# Patient Record
Sex: Female | Born: 1965 | Race: White | Hispanic: No | Smoking: Never smoker
Health system: Southern US, Community
[De-identification: ages and names within clinical notes are randomized; demographics above are authoritative.]

## PROBLEM LIST (undated history)

## (undated) DIAGNOSIS — R87619 Unspecified abnormal cytological findings in specimens from cervix uteri: Secondary | ICD-10-CM

## (undated) DIAGNOSIS — M549 Dorsalgia, unspecified: Secondary | ICD-10-CM

## (undated) DIAGNOSIS — F329 Major depressive disorder, single episode, unspecified: Secondary | ICD-10-CM

## (undated) DIAGNOSIS — F32A Depression, unspecified: Secondary | ICD-10-CM

## (undated) DIAGNOSIS — IMO0002 Reserved for concepts with insufficient information to code with codable children: Secondary | ICD-10-CM

## (undated) DIAGNOSIS — T8859XA Other complications of anesthesia, initial encounter: Secondary | ICD-10-CM

## (undated) DIAGNOSIS — M5136 Other intervertebral disc degeneration, lumbar region: Secondary | ICD-10-CM

## (undated) DIAGNOSIS — N840 Polyp of corpus uteri: Secondary | ICD-10-CM

## (undated) DIAGNOSIS — J309 Allergic rhinitis, unspecified: Secondary | ICD-10-CM

## (undated) DIAGNOSIS — Z8489 Family history of other specified conditions: Secondary | ICD-10-CM

## (undated) DIAGNOSIS — R519 Headache, unspecified: Secondary | ICD-10-CM

## (undated) DIAGNOSIS — M4802 Spinal stenosis, cervical region: Secondary | ICD-10-CM

## (undated) DIAGNOSIS — N950 Postmenopausal bleeding: Secondary | ICD-10-CM

## (undated) DIAGNOSIS — G4700 Insomnia, unspecified: Secondary | ICD-10-CM

## (undated) DIAGNOSIS — M4306 Spondylolysis, lumbar region: Secondary | ICD-10-CM

## (undated) DIAGNOSIS — R51 Headache: Secondary | ICD-10-CM

## (undated) DIAGNOSIS — K219 Gastro-esophageal reflux disease without esophagitis: Secondary | ICD-10-CM

## (undated) DIAGNOSIS — F419 Anxiety disorder, unspecified: Secondary | ICD-10-CM

## (undated) DIAGNOSIS — E559 Vitamin D deficiency, unspecified: Secondary | ICD-10-CM

## (undated) DIAGNOSIS — T4145XA Adverse effect of unspecified anesthetic, initial encounter: Secondary | ICD-10-CM

## (undated) DIAGNOSIS — M5030 Other cervical disc degeneration, unspecified cervical region: Secondary | ICD-10-CM

## (undated) HISTORY — PX: PILONIDAL CYST EXCISION: SHX744

## (undated) HISTORY — PX: WISDOM TOOTH EXTRACTION: SHX21

## (undated) HISTORY — DX: Reserved for concepts with insufficient information to code with codable children: IMO0002

## (undated) HISTORY — DX: Unspecified abnormal cytological findings in specimens from cervix uteri: R87.619

## (undated) HISTORY — PX: ANKLE SURGERY: SHX546

## (undated) HISTORY — DX: Major depressive disorder, single episode, unspecified: F32.9

## (undated) HISTORY — DX: Depression, unspecified: F32.A

## (undated) HISTORY — DX: Dorsalgia, unspecified: M54.9

## (undated) HISTORY — DX: Vitamin D deficiency, unspecified: E55.9

## (undated) HISTORY — DX: Allergic rhinitis, unspecified: J30.9

## (undated) HISTORY — DX: Insomnia, unspecified: G47.00

## (undated) HISTORY — DX: Anxiety disorder, unspecified: F41.9

---

## 1986-09-14 HISTORY — PX: PILONIDAL CYST EXCISION: SHX744

## 1999-09-04 ENCOUNTER — Other Ambulatory Visit: Admission: RE | Admit: 1999-09-04 | Discharge: 1999-09-04 | Payer: Self-pay | Admitting: Family Medicine

## 1999-09-15 HISTORY — PX: CERVICAL CONIZATION W/BX: SHX1330

## 2003-05-07 ENCOUNTER — Other Ambulatory Visit: Admission: RE | Admit: 2003-05-07 | Discharge: 2003-05-07 | Payer: Self-pay | Admitting: Obstetrics and Gynecology

## 2003-07-12 ENCOUNTER — Encounter: Admission: RE | Admit: 2003-07-12 | Discharge: 2003-07-12 | Payer: Self-pay | Admitting: Family Medicine

## 2003-07-13 DIAGNOSIS — M4306 Spondylolysis, lumbar region: Secondary | ICD-10-CM

## 2003-07-13 HISTORY — DX: Spondylolysis, lumbar region: M43.06

## 2005-02-13 ENCOUNTER — Other Ambulatory Visit: Admission: RE | Admit: 2005-02-13 | Discharge: 2005-02-13 | Payer: Self-pay | Admitting: Family Medicine

## 2005-02-16 LAB — HM PAP SMEAR: HM Pap smear: NEGATIVE

## 2007-08-18 ENCOUNTER — Encounter: Admission: RE | Admit: 2007-08-18 | Discharge: 2007-08-18 | Payer: Self-pay | Admitting: Family Medicine

## 2007-12-18 ENCOUNTER — Encounter: Admission: RE | Admit: 2007-12-18 | Discharge: 2007-12-18 | Payer: Self-pay | Admitting: Orthopaedic Surgery

## 2007-12-22 ENCOUNTER — Encounter: Admission: RE | Admit: 2007-12-22 | Discharge: 2007-12-22 | Payer: Self-pay | Admitting: Orthopaedic Surgery

## 2008-12-17 ENCOUNTER — Ambulatory Visit: Payer: Self-pay | Admitting: Family Medicine

## 2009-03-05 ENCOUNTER — Ambulatory Visit: Payer: Self-pay | Admitting: Family Medicine

## 2009-07-30 ENCOUNTER — Ambulatory Visit: Payer: Self-pay | Admitting: Family Medicine

## 2010-04-14 ENCOUNTER — Ambulatory Visit: Payer: Self-pay | Admitting: Family Medicine

## 2010-04-18 ENCOUNTER — Ambulatory Visit: Payer: Self-pay | Admitting: Family Medicine

## 2010-08-22 ENCOUNTER — Ambulatory Visit: Payer: Self-pay | Admitting: Family Medicine

## 2011-02-10 ENCOUNTER — Telehealth: Payer: Self-pay | Admitting: Family Medicine

## 2011-02-10 NOTE — Telephone Encounter (Signed)
Renew her bupropion and Ambien

## 2011-02-11 ENCOUNTER — Other Ambulatory Visit: Payer: Self-pay

## 2011-02-11 MED ORDER — ZOLPIDEM TARTRATE 10 MG PO TABS: 10.0000 mg | ORAL_TABLET | Freq: Every evening | ORAL | Status: DC | PRN

## 2011-02-11 MED ORDER — BUPROPION HCL ER (XL) 300 MG PO TB24: 300.0000 mg | ORAL_TABLET | ORAL | Status: DC

## 2011-02-19 ENCOUNTER — Other Ambulatory Visit: Payer: Self-pay | Admitting: *Deleted

## 2011-02-19 MED ORDER — DESLORATADINE 5 MG PO TABS: 5.0000 mg | ORAL_TABLET | Freq: Every day | ORAL | Status: DC

## 2011-05-11 ENCOUNTER — Telehealth: Payer: Self-pay | Admitting: Family Medicine

## 2011-05-11 MED ORDER — AZELASTINE HCL 0.1 % NA SOLN: 1.0000 | Freq: Two times a day (BID) | NASAL | Status: DC

## 2011-05-11 NOTE — Telephone Encounter (Signed)
Called in Astelin nasal spray #1 1-2 sprays in each nostril BID w/ 2 rf/'s to [facility] per [employer]

## 2011-05-21 ENCOUNTER — Telehealth: Payer: Self-pay | Admitting: Family Medicine

## 2011-05-21 MED ORDER — DESLORATADINE 5 MG PO TABS
5.0000 mg | ORAL_TABLET | Freq: Every day | ORAL | Status: DC
Start: 2011-05-21 — End: 2012-03-09

## 2011-05-21 NOTE — Telephone Encounter (Signed)
Refill req for Desloratadine 5 mg tabs 1 po qd

## 2011-05-22 ENCOUNTER — Telehealth: Payer: Self-pay | Admitting: Family Medicine

## 2011-05-22 NOTE — Telephone Encounter (Signed)
She needs a medication check visit.

## 2011-05-22 NOTE — Telephone Encounter (Signed)
I think this goes to you  

## 2011-05-27 ENCOUNTER — Telehealth: Payer: Self-pay | Admitting: Family Medicine

## 2011-05-27 NOTE — Telephone Encounter (Signed)
CALLED PT SEVERAL TIMES. LEFT MESSAGE TO CALL TO MAKE APPT

## 2011-06-02 ENCOUNTER — Encounter: Payer: Self-pay | Admitting: Family Medicine

## 2011-06-02 ENCOUNTER — Ambulatory Visit (INDEPENDENT_AMBULATORY_CARE_PROVIDER_SITE_OTHER): Payer: Self-pay | Admitting: Family Medicine

## 2011-06-02 VITALS — BP 116/70 | HR 69 | Wt 156.0 lb

## 2011-06-02 DIAGNOSIS — F341 Dysthymic disorder: Secondary | ICD-10-CM

## 2011-06-02 DIAGNOSIS — E559 Vitamin D deficiency, unspecified: Secondary | ICD-10-CM

## 2011-06-02 DIAGNOSIS — J301 Allergic rhinitis due to pollen: Secondary | ICD-10-CM

## 2011-06-02 DIAGNOSIS — Z79899 Other long term (current) drug therapy: Secondary | ICD-10-CM

## 2011-06-02 LAB — CBC WITH DIFFERENTIAL/PLATELET
Basophils Absolute: 0 10*3/uL (ref 0.0–0.1)
Basophils Relative: 1 % (ref 0–1)
Eosinophils Absolute: 0.5 10*3/uL (ref 0.0–0.7)
Eosinophils Relative: 7 % — ABNORMAL HIGH (ref 0–5)
HCT: 40 % (ref 36.0–46.0)
Hemoglobin: 13.7 g/dL (ref 12.0–15.0)
Lymphocytes Relative: 29 % (ref 12–46)
Lymphs Abs: 2 10*3/uL (ref 0.7–4.0)
MCH: 30.7 pg (ref 26.0–34.0)
MCHC: 34.3 g/dL (ref 30.0–36.0)
MCV: 89.7 fL (ref 78.0–100.0)
Monocytes Absolute: 0.6 10*3/uL (ref 0.1–1.0)
Monocytes Relative: 8 % (ref 3–12)
Neutro Abs: 3.9 10*3/uL (ref 1.7–7.7)
Neutrophils Relative %: 55 % (ref 43–77)
Platelets: 264 10*3/uL (ref 150–400)
RBC: 4.46 MIL/uL (ref 3.87–5.11)
RDW: 12.7 % (ref 11.5–15.5)
WBC: 7 10*3/uL (ref 4.0–10.5)

## 2011-06-02 LAB — COMPREHENSIVE METABOLIC PANEL
ALT: 21 U/L (ref 0–35)
AST: 15 U/L (ref 0–37)
Albumin: 4.5 g/dL (ref 3.5–5.2)
Alkaline Phosphatase: 49 U/L (ref 39–117)
BUN: 12 mg/dL (ref 6–23)
CO2: 26 mEq/L (ref 19–32)
Calcium: 9.7 mg/dL (ref 8.4–10.5)
Chloride: 102 mEq/L (ref 96–112)
Creat: 0.97 mg/dL (ref 0.50–1.10)
Glucose, Bld: 83 mg/dL (ref 70–99)
Potassium: 4.8 mEq/L (ref 3.5–5.3)
Sodium: 138 mEq/L (ref 135–145)
Total Bilirubin: 0.7 mg/dL (ref 0.3–1.2)
Total Protein: 7.1 g/dL (ref 6.0–8.3)

## 2011-06-02 LAB — LIPID PANEL
Cholesterol: 194 mg/dL (ref 0–200)
HDL: 68 mg/dL (ref >39)
LDL Cholesterol: 110 mg/dL — ABNORMAL HIGH (ref 0–99)
Total CHOL/HDL Ratio: 2.9 Ratio
Triglycerides: 79 mg/dL (ref <150)
VLDL: 16 mg/dL (ref 0–40)

## 2011-06-02 MED ORDER — ZOLPIDEM TARTRATE 10 MG PO TABS: 10.0000 mg | ORAL_TABLET | Freq: Every evening | ORAL | Status: DC | PRN

## 2011-06-02 MED ORDER — LEVOCETIRIZINE DIHYDROCHLORIDE 5 MG PO TABS: 5.0000 mg | ORAL_TABLET | Freq: Every evening | ORAL | Status: DC

## 2011-06-02 MED ORDER — BUPROPION HCL ER (XL) 300 MG PO TB24: 300.0000 mg | ORAL_TABLET | ORAL | Status: DC

## 2011-06-02 NOTE — Progress Notes (Signed)
  Subjective:    Patient ID: Brandi Carpenter, female    DOB: 31-Mar-1966, 45 y.o.   MRN: [id number]  HPI She is here for consultation concerning continued use of Wellbutrin and Ambien. She has been on Wellbutrin for a long period of time. She did try SSRIs in the past and found them to not be very useful. She has been stable on this medication for several years. She did try stopping at one time but several months later started back again do to increased anxiety and irritability. She uses Ambien once or twice per week usually at the 5 mg dosing. Apparently in the past she has had some counseling. She also has underlying allergies and thinks that Xyzal works better than Clarinex. She also has a vitamin D deficiency and continues on 50,000 units weekly.  Review of Systems     Objective:   Physical Exam Alert and in no distress with appropriate affect       Assessment & Plan:   1. Dysthymia   2. Allergic rhinitis due to pollen   3. Encounter for long-term (current) use of other medications   4   vitamin D deficiency I will continue her on Wellbutrin and Ambien. Will also switch her to Xyzal. Routine blood screening including vitamin D. Discussed possible discontinuation of Wellbutrin possibly next spring

## 2011-06-02 NOTE — Progress Notes (Signed)
Addended by: Ronnald Nian on: 06/02/2011 11:17 AM   Modules accepted: Orders

## 2011-06-03 LAB — VITAMIN D 25 HYDROXY (VIT D DEFICIENCY, FRACTURES): Vit D, 25-Hydroxy: 79 ng/mL (ref 30–89)

## 2011-06-04 ENCOUNTER — Telehealth: Payer: Self-pay

## 2011-06-04 NOTE — Telephone Encounter (Signed)
Called pt to let her know lab work looks good

## 2011-06-08 ENCOUNTER — Telehealth: Payer: Self-pay | Admitting: Family Medicine

## 2011-06-08 NOTE — Telephone Encounter (Signed)
DONE

## 2011-07-31 ENCOUNTER — Other Ambulatory Visit: Payer: Self-pay | Admitting: Family Medicine

## 2011-07-31 NOTE — Telephone Encounter (Signed)
Is this okay?

## 2011-12-03 ENCOUNTER — Other Ambulatory Visit: Payer: Self-pay | Admitting: Family Medicine

## 2011-12-03 NOTE — Telephone Encounter (Signed)
Is this ok?

## 2011-12-03 NOTE — Telephone Encounter (Signed)
It looks like she needs an appointment

## 2011-12-29 ENCOUNTER — Other Ambulatory Visit: Payer: Self-pay | Admitting: Family Medicine

## 2011-12-30 NOTE — Telephone Encounter (Signed)
Called in ambien per jcl 

## 2011-12-30 NOTE — Telephone Encounter (Signed)
Is this ok?

## 2011-12-30 NOTE — Telephone Encounter (Signed)
Review of the med

## 2012-02-03 ENCOUNTER — Other Ambulatory Visit: Payer: Self-pay | Admitting: Family Medicine

## 2012-02-04 NOTE — Telephone Encounter (Signed)
Her med was renewed. Have her set up an appointment in several months.

## 2012-02-04 NOTE — Telephone Encounter (Signed)
Is this ok?

## 2012-03-09 ENCOUNTER — Ambulatory Visit (INDEPENDENT_AMBULATORY_CARE_PROVIDER_SITE_OTHER): Payer: BC Managed Care – PPO | Admitting: Family Medicine

## 2012-03-09 ENCOUNTER — Encounter: Payer: Self-pay | Admitting: Family Medicine

## 2012-03-09 VITALS — BP 122/84 | HR 72 | Temp 98.6°F | Ht 64.0 in | Wt 149.0 lb

## 2012-03-09 DIAGNOSIS — F411 Generalized anxiety disorder: Secondary | ICD-10-CM

## 2012-03-09 DIAGNOSIS — G4700 Insomnia, unspecified: Secondary | ICD-10-CM

## 2012-03-09 DIAGNOSIS — J309 Allergic rhinitis, unspecified: Secondary | ICD-10-CM

## 2012-03-09 DIAGNOSIS — F419 Anxiety disorder, unspecified: Secondary | ICD-10-CM

## 2012-03-09 MED ORDER — FLUTICASONE PROPIONATE 50 MCG/ACT NA SUSP: 2.0000 | Freq: Every day | NASAL | Status: DC

## 2012-03-09 MED ORDER — CITALOPRAM HYDROBROMIDE 20 MG PO TABS: 20.0000 mg | ORAL_TABLET | Freq: Every day | ORAL | Status: DC

## 2012-03-09 MED ORDER — ALPRAZOLAM 0.5 MG PO TABS: 0.2500 mg | ORAL_TABLET | Freq: Three times a day (TID) | ORAL | Status: AC | PRN

## 2012-03-09 NOTE — Patient Instructions (Signed)
Start citalopram at 1/2 tablet in the mornings.  Do this for at least a week.  If no side effects and feeling better, continue 1/2 tablet for longer.  If you still don't feel back to normal, then increase to full tablet.  If it makes you sleepy, change to bedtime dosing.

## 2012-03-09 NOTE — Progress Notes (Signed)
Chief Complaint  Patient presents with  . Advice Only    feels jittery and anxious sometimes. Feels that maybe her sleep issues may be contributing to this. The last month has been worse than normal. Was rx'd xanax 0.5mg  by Selena Batten and that worked well. Also has really bad sinus pressure. Hands and toes get really cold x several years.   HPI: See symptoms above. Needing to take the ambien nightly over the last month. Used to need Ambien a few times/month. Falls asleep okay, but awakens in 3 hours, and unable to get back to sleep.  Seems to be feeling better the last few days, but didn't seem to follow her cycle like she did previously. Usually gets edginess, feeling jittery seems to occur midcycle, prior to PMS symptoms settling in, and lasts 3-4 days. Menses are regular, lasting only 2-3 days.  Hectic at job, with Psychologist, prison and probation services ( x 1 year). Some aging parent issues (fairly minor). Lost her dog 2 months ago.  Found to have cancer and died within 25 days. Other dog has had behavioral changes since losing its sibling.  Sometimes feels panicky, almost even claustrophobic.  Felt very panicky at the dentist.  She is also complaining of sinus pressure--stopped sudafed a week ago, no change in sinus pressure (didn't get better on it, no worse off it). Mucus is clear. PND phlegm is clear, sometimes thick.  Past Medical History  Diagnosis Date  . Depression   . Anxiety   . Insomnia   . Vitamin d deficiency   . Allergic rhinitis, cause unspecified   . Abnormal Pap smear     ASCUS 08/1999; CIN 11/1999, s/p conization    Past Surgical History  Procedure Date  . Ankle surgery child    left for torn ligaments  . Pilonidal cyst excision   . Cervical conization w/bx 2001    History   Social History  . Marital Status: Single    Spouse Name: N/A    Number of Children: 0  . Years of Education: N/A   Occupational History  . Holiday representative   Social History Main Topics  . Smoking status:  Never Smoker   . Smokeless tobacco: Never Used  . Alcohol Use: No  . Drug Use: No  . Sexually Active: Yes -- Female partner(s)   Other Topics Concern  . Not on file   Social History Narrative   Lives with female partner    Family History  Problem Relation Age of Onset  . Hypertension Mother   . Diabetes Mother   . Heart disease Father   . Depression Sister   . Cancer Maternal Grandmother     colon, died at 59  . Diabetes Maternal Grandmother   . Cancer Cousin     breast cancer  . Alzheimer's disease Paternal Grandmother     Current outpatient prescriptions:azelastine (ASTELIN) 137 MCG/SPRAY nasal spray, instill 1 to 2 sprays into each nostril twice a day as directed, Disp: 30 mL, Rfl: 11;  buPROPion (WELLBUTRIN XL) 300 MG 24 hr tablet, take 1 tablet by mouth every morning, Disp: 30 tablet, Rfl: 3;  levocetirizine (XYZAL) 5 MG tablet, Take 1 tablet (5 mg total) by mouth every evening., Disp: 30 tablet, Rfl: 12 zolpidem (AMBIEN) 10 MG tablet, take 1 tablet by mouth at bedtime for sleep, Disp: 30 tablet, Rfl: 1;  ALPRAZolam (XANAX) 0.5 MG tablet, Take 0.5-1 tablets (0.25-0.5 mg total) by mouth 3 (three) times daily as needed for anxiety., Disp:  15 tablet, Rfl: 0;  citalopram (CELEXA) 20 MG tablet, Take 1 tablet (20 mg total) by mouth daily. Start at 1/2 tablet for a week., Disp: 30 tablet, Rfl: 2 fluticasone (FLONASE) 50 MCG/ACT nasal spray, Place 2 sprays into the nose daily., Disp: 16 g, Rfl: 6;  zolpidem (AMBIEN) 10 MG tablet, Take 1 tablet (10 mg total) by mouth at bedtime as needed for sleep., Disp: 30 tablet, Rfl: 0;  zolpidem (AMBIEN) 10 MG tablet, Take 1 tablet (10 mg total) by mouth at bedtime as needed for sleep., Disp: 30 tablet, Rfl: 1  No Known Allergies  ROS:  Denies fevers, headaches, purulence, sore throat, cough, shortness of breath (except with anxiety), GI complaints. Denies weight changes, bowel changes, nausea.  Denies suicidal ideation. +anxiety, +insomnia. See  HPI  PHYSICAL EXAM: BP 122/84  Pulse 72  Temp 98.6 F (37 C) (Oral)  Ht 5\' 4"  (1.626 m)  Wt 149 lb (67.586 kg)  BMI 25.58 kg/m2  LMP 02/26/2012 Well developed, pleasant female in no distress HEENT:  PERRL, EOMI, conjunctiva clear.  Nasal mucosa--Mod edema with clear mucus.  Sinuses nontender.  OP clear Neck: no lymphadenopathy or thyromegaly Heart: regular rate and rhythm without murmur Lungs: clear bilaterally Extremities: no edema  ASSESSMENT/PLAN: 1. Anxiety  citalopram (CELEXA) 20 MG tablet, ALPRAZolam (XANAX) 0.5 MG tablet  2. Insomnia    3. Allergic rhinitis, cause unspecified  fluticasone (FLONASE) 50 MCG/ACT nasal spray   Depression/anxiety--inadequately controlled with wellbutrin alone.  Continue wellbutrin Add celexa 20mg --start 1/2 tablet daily for a week or two, and if not feeling better, then increase to full tablet. Risks and side effects reviewed. Continue to use Ambien as needed--need will likely decrease as the celexa becomes effective. Discussed recommendation to change to CR if has chronic/ongoing needs (doubt this will be the case). Xanax for infrequent use, in case of severe anxiety.  Allergies and sinus congestion: Decongestants, sinus rinses, Mucinex. Start Flonase--educated on proper use.  F/u at CPE, sooner if needed on moods.

## 2012-05-03 ENCOUNTER — Encounter: Payer: Self-pay | Admitting: Internal Medicine

## 2012-05-09 ENCOUNTER — Ambulatory Visit (INDEPENDENT_AMBULATORY_CARE_PROVIDER_SITE_OTHER): Payer: BC Managed Care – PPO | Admitting: Family Medicine

## 2012-05-09 ENCOUNTER — Other Ambulatory Visit (HOSPITAL_COMMUNITY)
Admission: RE | Admit: 2012-05-09 | Discharge: 2012-05-09 | Disposition: A | Discharge status: Home / Self Care | Payer: BC Managed Care – PPO | Source: Ambulatory Visit | Attending: Family Medicine | Admitting: Family Medicine

## 2012-05-09 ENCOUNTER — Encounter: Payer: Self-pay | Admitting: Family Medicine

## 2012-05-09 ENCOUNTER — Encounter: Payer: Self-pay | Admitting: Gastroenterology

## 2012-05-09 VITALS — BP 120/86 | HR 60 | Ht 64.5 in | Wt 154.0 lb

## 2012-05-09 DIAGNOSIS — Z0000 Encounter for general adult medical examination without abnormal findings: Secondary | ICD-10-CM

## 2012-05-09 DIAGNOSIS — Z23 Encounter for immunization: Secondary | ICD-10-CM

## 2012-05-09 DIAGNOSIS — H1010 Acute atopic conjunctivitis, unspecified eye: Secondary | ICD-10-CM

## 2012-05-09 DIAGNOSIS — Z01419 Encounter for gynecological examination (general) (routine) without abnormal findings: Secondary | ICD-10-CM | POA: Insufficient documentation

## 2012-05-09 DIAGNOSIS — F329 Major depressive disorder, single episode, unspecified: Secondary | ICD-10-CM

## 2012-05-09 DIAGNOSIS — H1045 Other chronic allergic conjunctivitis: Secondary | ICD-10-CM

## 2012-05-09 DIAGNOSIS — F419 Anxiety disorder, unspecified: Secondary | ICD-10-CM

## 2012-05-09 DIAGNOSIS — J309 Allergic rhinitis, unspecified: Secondary | ICD-10-CM

## 2012-05-09 DIAGNOSIS — G4700 Insomnia, unspecified: Secondary | ICD-10-CM

## 2012-05-09 DIAGNOSIS — Z1211 Encounter for screening for malignant neoplasm of colon: Secondary | ICD-10-CM

## 2012-05-09 DIAGNOSIS — F411 Generalized anxiety disorder: Secondary | ICD-10-CM

## 2012-05-09 LAB — POCT URINALYSIS DIPSTICK
Bilirubin, UA: NEGATIVE
Glucose, UA: NEGATIVE
Ketones, UA: NEGATIVE
Leukocytes, UA: NEGATIVE
Nitrite, UA: NEGATIVE
Protein, UA: NEGATIVE
Spec Grav, UA: 1.02
Urobilinogen, UA: NEGATIVE
pH, UA: 5

## 2012-05-09 MED ORDER — BUPROPION HCL ER (XL) 300 MG PO TB24: 300.0000 mg | ORAL_TABLET | ORAL | Status: DC

## 2012-05-09 MED ORDER — ZOLPIDEM TARTRATE 10 MG PO TABS: 10.0000 mg | ORAL_TABLET | Freq: Every evening | ORAL | Status: DC | PRN

## 2012-05-09 MED ORDER — OLOPATADINE HCL 0.1 % OP SOLN: 1.0000 [drp] | Freq: Two times a day (BID) | OPHTHALMIC | Status: DC

## 2012-05-09 MED ORDER — MONTELUKAST SODIUM 10 MG PO TABS: 10.0000 mg | ORAL_TABLET | Freq: Every day | ORAL | Status: DC

## 2012-05-09 MED ORDER — XYZAL 5 MG PO TABS: 5.0000 mg | ORAL_TABLET | Freq: Every evening | ORAL | Status: DC

## 2012-05-09 NOTE — Progress Notes (Signed)
Chief Complaint  Patient presents with  . Annual Exam    fasting annual exam with pap. UA showed trace blood, pt is at the end of the of her period. Pt taking Xyzal but is having some issues with itchy and swollen eyes.   HPI: Brandi Carpenter is a 46 y.o. female who presents for a complete physical.  She has the following concerns:  F/u depression/anxiety:  Since adding the celexa she is doing better.  Only needed xanax prior to going to the dentist. She feels more energized, wanting to get up and do things. Still has some trouble sleeping intermittently, not as constant as before  Allergies--Flonase is helping, also taking xyzal daily.  Has been having some sinus headaches, last took sudafed yesterday.  Eyes feel swollen, and some itchiness. Thinks the branded Xyzal worked better than the generic.  Health Maintenance: There is no immunization history on file for this patient. Last tetanus unknown, many years ago Last Pap smear: 6/06 Last mammogram: years ago (likely prior to '06) Last colonoscopy: never Last DEXA: never Dentist: recent Ophtho: 2 years ago Exercise: yardwork, otherwise not much (since she lost her dog who was her "workout partner"; usually 1-2x/week  Past Medical History  Diagnosis Date  . Depression   . Anxiety   . Insomnia   . Vitamin d deficiency   . Allergic rhinitis, cause unspecified   . Abnormal Pap smear     ASCUS 08/1999; CIN 11/1999, s/p conization    Past Surgical History  Procedure Date  . Ankle surgery child    left for torn ligaments  . Pilonidal cyst excision   . Cervical conization w/bx 2001    History   Social History  . Marital Status: Single    Spouse Name: N/A    Number of Children: 0  . Years of Education: N/A   Occupational History  . Holiday representative   Social History Main Topics  . Smoking status: Never Smoker   . Smokeless tobacco: Never Used  . Alcohol Use: No  . Drug Use: No  . Sexually Active: Yes -- Female  partner(s)   Other Topics Concern  . Not on file   Social History Narrative   Lives with female partner, 1 dog (beagle)    Family History  Problem Relation Age of Onset  . Hypertension Mother   . Diabetes Mother   . Heart disease Father   . Depression Sister   . Cancer Maternal Grandmother     colon, died at 39  . Diabetes Maternal Grandmother   . Cancer Cousin     breast cancer  . Alzheimer's disease Paternal Grandmother     Current outpatient prescriptions:buPROPion (WELLBUTRIN XL) 300 MG 24 hr tablet, take 1 tablet by mouth every morning, Disp: 30 tablet, Rfl: 3;  citalopram (CELEXA) 20 MG tablet, Take 10 mg by mouth daily., Disp: , Rfl: ;  fluticasone (FLONASE) 50 MCG/ACT nasal spray, Place 2 sprays into the nose daily., Disp: 16 g, Rfl: 6;  levocetirizine (XYZAL) 5 MG tablet, Take 1 tablet (5 mg total) by mouth every evening., Disp: 30 tablet, Rfl: 12 zolpidem (AMBIEN) 10 MG tablet, take 1 tablet by mouth at bedtime for sleep, Disp: 30 tablet, Rfl: 1;  DISCONTD: citalopram (CELEXA) 20 MG tablet, Take 1 tablet (20 mg total) by mouth daily. Start at 1/2 tablet for a week., Disp: 30 tablet, Rfl: 2;  ALPRAZolam (XANAX) 0.5 MG tablet, Take 0.5 mg by mouth as needed., Disp: ,  Rfl:  zolpidem (AMBIEN) 10 MG tablet, Take 1 tablet (10 mg total) by mouth at bedtime as needed for sleep., Disp: 30 tablet, Rfl: 0;  zolpidem (AMBIEN) 10 MG tablet, Take 1 tablet (10 mg total) by mouth at bedtime as needed for sleep., Disp: 30 tablet, Rfl: 1  Allergies  Allergen Reactions  . Codeine Itching and Nausea And Vomiting   ROS: The patient denies anorexia, fever, weight changes, headaches,  vision changes, decreased hearing, ear pain, sore throat, breast concerns, chest pain, palpitations, dizziness, syncope, dyspnea on exertion, cough, swelling, nausea, vomiting, diarrhea, constipation, abdominal pain, melena, hematochezia, indigestion/heartburn, hematuria, incontinence, dysuria, irregular menstrual  cycles, vaginal discharge, odor or itch, genital lesions, joint pains, numbness, tingling, weakness, tremor, suspicious skin lesions, depression, anxiety, abnormal bleeding/bruising, or enlarged lymph nodes. Periods are light, lasts just 2-3 days. Sinus headaches, itchy swollen eyes.  PHYSICAL EXAM: BP 120/86  Pulse 60  Ht 5' 4.5" (1.638 m)  Wt 154 lb (69.854 kg)  BMI 26.03 kg/m2  LMP 05/07/2012  General Appearance:    Alert, cooperative, no distress, appears stated age.  Frequent throat clearing.  Head:    Normocephalic, without obvious abnormality, atraumatic  Eyes:    PERRL, conjunctiva/corneas clear, EOM's intact, fundi    benign  Ears:    Normal TM's and external ear canals  Nose:   Nares normal, mucosa mildly edematous, some recently bleeding on right, no drainage or sinus tenderness  Throat:   Lips, mucosa, and tongue normal; teeth and gums normal  Neck:   Supple, no lymphadenopathy;  thyroid:  no   enlargement/tenderness/nodules; no carotid   bruit or JVD  Back:    Spine nontender, no curvature, ROM normal, no CVA     tenderness  Lungs:     Clear to auscultation bilaterally without wheezes, rales or     ronchi; respirations unlabored  Chest Wall:    No tenderness or deformity   Heart:    Regular rate and rhythm, S1 and S2 normal, no murmur, rub   or gallop  Breast Exam:    No tenderness, masses, or nipple discharge or inversion.      No axillary lymphadenopathy. Some fibroglandular changes and mild tenderness right RUOQ  Abdomen:     Soft, non-tender, nondistended, normoactive bowel sounds,    no masses, no hepatosplenomegaly  Genitalia:    Normal external genitalia without lesions.  BUS and vagina normal; cervix without lesions, or cervical motion tenderness. No abnormal vaginal discharge.  Uterus and adnexa not enlarged, nontender, no masses.  Pap performed  Rectal:    Normal tone, no masses or tenderness; no stool in vault to heme test  Extremities:   No clubbing, cyanosis  or edema  Pulses:   2+ and symmetric all extremities  Skin:   Skin color, texture, turgor normal, no rashes or lesions  Lymph nodes:   Cervical, supraclavicular, and axillary nodes normal  Neurologic:   CNII-XII intact, normal strength, sensation and gait; reflexes 2+ and symmetric throughout          Psych:   Normal mood, affect, hygiene and grooming.    Labs from Dr. Susann Givens 05/2011--none needed today. Lab Results  Component Value Date   WBC 7.0 06/02/2011   HGB 13.7 06/02/2011   HCT 40.0 06/02/2011   MCV 89.7 06/02/2011   PLT 264 06/02/2011     Chemistry      Component Value Date/Time   NA 138 06/02/2011 1117   K 4.8 06/02/2011 1117  CL 102 06/02/2011 1117   CO2 26 06/02/2011 1117   BUN 12 06/02/2011 1117   CREATININE 0.97 06/02/2011 1117      Component Value Date/Time   CALCIUM 9.7 06/02/2011 1117   ALKPHOS 49 06/02/2011 1117   AST 15 06/02/2011 1117   ALT 21 06/02/2011 1117   BILITOT 0.7 06/02/2011 1117     Lab Results  Component Value Date   CHOL 194 06/02/2011   HDL 68 06/02/2011   LDLCALC 110* 06/02/2011   TRIG 79 06/02/2011   CHOLHDL 2.9 06/02/2011    ASSESSMENT/PLAN: 1. Routine general medical examination at a health care facility  POCT Urinalysis Dipstick, Visual acuity screening, Cytology - PAP  2. Allergic rhinitis, cause unspecified  XYZAL 5 MG tablet, montelukast (SINGULAIR) 10 MG tablet  3. Insomnia  zolpidem (AMBIEN) 10 MG tablet  4. Depression  buPROPion (WELLBUTRIN XL) 300 MG 24 hr tablet  5. Allergic conjunctivitis  olopatadine (PATANOL) 0.1 % ophthalmic solution  6. Need for Tdap vaccination  Tdap vaccine greater than or equal to 7yo IM  7. Screen for colon cancer  Ambulatory referral to Gastroenterology  8. Anxiety     Allergies:  Change to DAW branded xyzal.  Continue Flonase.  Rx Singulair (keep on hold--use if needed after changing to branded xyzal).  Rx Patanol  Refer for colonoscopy (family history of cancer and polyps). Hemoccult cards given.  Discussed  monthly self breast exams and yearly mammograms after the age of 56; at least 30 minutes of aerobic activity at least 5 days/week; proper sunscreen use reviewed; healthy diet, including goals of calcium and vitamin D intake and alcohol recommendations (less than or equal to 1 drink/day) reviewed; regular seatbelt use; changing batteries in smoke detectors.  Immunization recommendations discussed--TdaP given.  Colonoscopy recommendations reviewed-referred

## 2012-05-09 NOTE — Patient Instructions (Signed)

## 2012-05-11 ENCOUNTER — Encounter: Payer: Self-pay | Admitting: Family Medicine

## 2012-05-12 NOTE — Addendum Note (Signed)
Addended byJoselyn Arrow on: 05/12/2012 04:56 PM   Modules accepted: Level of Service

## 2012-06-06 ENCOUNTER — Ambulatory Visit (INDEPENDENT_AMBULATORY_CARE_PROVIDER_SITE_OTHER): Payer: BC Managed Care – PPO | Admitting: Gastroenterology

## 2012-06-06 ENCOUNTER — Encounter: Payer: Self-pay | Admitting: Gastroenterology

## 2012-06-06 VITALS — BP 108/64 | HR 76 | Ht 64.0 in | Wt 151.5 lb

## 2012-06-06 DIAGNOSIS — Z1211 Encounter for screening for malignant neoplasm of colon: Secondary | ICD-10-CM

## 2012-06-06 NOTE — Progress Notes (Signed)
HPI: This is a    very pleasant 46 year old woman whom I am meeting for the first time today.  Grandmother with colon cancer.  She thought her mother had colon polyps however she spoke with her mother more recently and in fact her mother has never had colon polyps. She knows of no first degree relatives with colon cancer or colon polyps.   No overt bleeding, constipation, bleeding.  + a bit of mucous production recently.  Overall stable weight.   No signficant abd pains.  NEver had a colonoscopy.   Review of systems: Pertinent positive and negative review of systems were noted in the above HPI section. Complete review of systems was performed and was otherwise normal.    Past Medical History  Diagnosis Date  . Depression   . Anxiety   . Insomnia   . Vitamin d deficiency   . Allergic rhinitis, cause unspecified   . Abnormal Pap smear     ASCUS 08/1999; CIN 11/1999, s/p conization    Past Surgical History  Procedure Date  . Ankle surgery child    left for torn ligaments, left  . Pilonidal cyst excision   . Cervical conization w/bx 2001    Current Outpatient Prescriptions  Medication Sig Dispense Refill  . ALPRAZolam (XANAX) 0.5 MG tablet Take 0.5 mg by mouth as needed.      Marland Kitchen buPROPion (WELLBUTRIN XL) 300 MG 24 hr tablet Take 1 tablet (300 mg total) by mouth every morning.  30 tablet  11  . citalopram (CELEXA) 20 MG tablet Take 10 mg by mouth daily.      . fluticasone (FLONASE) 50 MCG/ACT nasal spray Place 2 sprays into the nose daily.  16 g  6  . Guaifenesin (MUCINEX MAXIMUM STRENGTH) 1200 MG TB12 Take 1 tablet by mouth daily.      . montelukast (SINGULAIR) 10 MG tablet Take 1 tablet (10 mg total) by mouth at bedtime.  30 tablet  11  . olopatadine (PATANOL) 0.1 % ophthalmic solution Place 1 drop into both eyes 2 (two) times daily.  5 mL  12  . Pseudoephedrine HCl (SUDAFED 12 HOUR PO) Take 1 tablet by mouth daily.      Marland Kitchen XYZAL 5 MG tablet Take 1 tablet (5 mg total) by mouth  every evening.  30 tablet  12  . zolpidem (AMBIEN) 10 MG tablet Take 1 tablet (10 mg total) by mouth at bedtime as needed for sleep.  30 tablet  1    Allergies as of 06/06/2012 - Review Complete 06/06/2012  Allergen Reaction Noted  . Codeine Itching and Nausea And Vomiting 05/09/2012    Family History  Problem Relation Age of Onset  . Hypertension Mother   . Diabetes Mother   . Heart disease Father   . Depression Sister   . Colon cancer Maternal Grandmother      died at 64  . Diabetes Maternal Grandmother   . Breast cancer Cousin   . Alzheimer's disease Paternal Grandmother   . Colon polyps Maternal Uncle     History   Social History  . Marital Status: Single    Spouse Name: N/A    Number of Children: 0  . Years of Education: N/A   Occupational History  . Holiday representative   Social History Main Topics  . Smoking status: Never Smoker   . Smokeless tobacco: Never Used  . Alcohol Use: No  . Drug Use: No  . Sexually Active: Yes -- Female  partner(s)   Other Topics Concern  . Not on file   Social History Narrative   Lives with female partner, 1 dog (beagle)       Physical Exam: BP 108/64  Pulse 76  Ht 5\' 4"  (1.626 m)  Wt 151 lb 8 oz (68.72 kg)  BMI 26.00 kg/m2  LMP 06/03/2012 Constitutional: generally well-appearing Psychiatric: alert and oriented x3 Eyes: extraocular movements intact Mouth: oral pharynx moist, no lesions Neck: supple no lymphadenopathy Cardiovascular: heart regular rate and rhythm Lungs: clear to auscultation bilaterally Abdomen: soft, nontender, nondistended, no obvious ascites, no peritoneal signs, normal bowel sounds Extremities: no lower extremity edema bilaterally Skin: no lesions on visible extremities    Assessment and plan: 46 y.o. female with  routine risk for colon cancer  She is a single second degree relative with colon cancer however no first degree relatives with colon cancer or colon polyps. She has had no alarm  features or changes in her bowels. She is therefore at routine risk for colon cancer and we discussed that she should probably begin colon cancer screening at age 66. She would like to proceed with colonoscopy at that time. I recommended that she has not been Hemoccult testing prior to then at least not for colon cancer screening reasons. She knows to call if she has any changes or concerning findings with her bowels prior to age 44.

## 2012-06-06 NOTE — Patient Instructions (Addendum)
You are at routine risk for colon cancer.  Should start colon cancer screening at age 46, this includes hemocult stool testing. Recall colonoscopy 05/2015 for routine risk. If any concerning GI changes, please call.

## 2012-08-31 ENCOUNTER — Other Ambulatory Visit: Payer: Self-pay | Admitting: Family Medicine

## 2012-09-27 ENCOUNTER — Other Ambulatory Visit: Payer: Self-pay | Admitting: Family Medicine

## 2012-10-25 ENCOUNTER — Other Ambulatory Visit: Payer: Self-pay | Admitting: Family Medicine

## 2012-11-30 ENCOUNTER — Other Ambulatory Visit: Payer: Self-pay | Admitting: Family Medicine

## 2012-12-01 NOTE — Telephone Encounter (Signed)
Is this okay to refill? 

## 2012-12-01 NOTE — Telephone Encounter (Signed)
Ok for #30, no refill 

## 2012-12-07 ENCOUNTER — Telehealth: Payer: Self-pay | Admitting: Family Medicine

## 2012-12-07 NOTE — Telephone Encounter (Signed)
LM

## 2012-12-08 ENCOUNTER — Telehealth: Payer: Self-pay | Admitting: Family Medicine

## 2012-12-08 ENCOUNTER — Ambulatory Visit (INDEPENDENT_AMBULATORY_CARE_PROVIDER_SITE_OTHER): Payer: BC Managed Care – PPO | Admitting: Family Medicine

## 2012-12-08 ENCOUNTER — Encounter: Payer: Self-pay | Admitting: Family Medicine

## 2012-12-08 VITALS — BP 120/88 | HR 72 | Temp 98.4°F | Ht 64.5 in | Wt 156.0 lb

## 2012-12-08 DIAGNOSIS — J309 Allergic rhinitis, unspecified: Secondary | ICD-10-CM

## 2012-12-08 DIAGNOSIS — J069 Acute upper respiratory infection, unspecified: Secondary | ICD-10-CM

## 2012-12-08 MED ORDER — AZITHROMYCIN 250 MG PO TABS: ORAL_TABLET | ORAL | Status: DC

## 2012-12-08 MED ORDER — CICLESONIDE 37 MCG/ACT NA AERS: 1.0000 | INHALATION_SPRAY | Freq: Every day | NASAL | Status: DC

## 2012-12-08 NOTE — Patient Instructions (Addendum)
Start antibiotics in 3-5 days if symptoms persist/worsen.  Change from Flonase to [place].  You may be snorting the medicine too hard, and not getting the full effect of it.  Continue mucinex twice daily. Continue sinus rinses Continue antihistamine (xyzal vs trying other such as allegra). Continue singulair.  If your symptoms worsen, start the antibiotic If your symptoms persist, don't worsen, but allergies just aren't controlled with this regimen, please call for referral to allergist.  If the zetonna is helping more than the Flonase, and you would like to continue it, call for refill to be sent to your pharmacy.  Use sudafed as needed for sinus pressure.

## 2012-12-08 NOTE — Telephone Encounter (Signed)
LM

## 2012-12-08 NOTE — Progress Notes (Signed)
Chief Complaint  Patient presents with  . Cough    sinus drainage and congestion, cough started this past weekend. No fever. No body aches. Face and teeth pain/pressure, ear tingling B/L.   Began 5 days ago with increase over her baseline postnasal drainage and cough.  Phlegm is no longer as clear as usual, now whitish-yellow.  Having some pressure in her forehead, behind her eyes and in both cheeks, upper teeth.  Having trouble concentrating.  Denies fevers, shortness of breath, chest tightness. Pressure in her ears, some plugging/popping, slight dizziness/vertigo. Cough is worse in the evenings, before going to bed, bad for about 2 hours after waking in the morning, and around noon. Denies shortness of breath/wheezing.  Taking benadryl on top of her regular medications, sometimes at bedtime, sometimes during the day.  Unable to tolerate the 12 hr sudafed--still kept her awake at night.  Using the 6 hr med sporadically.  Frustrated with which meds she needs to use.  Admits to using flonase more frequently than she should, and also snorting it harder, with meds draining down throat.  She is frustrated, taking lots of medications, not sure what to do next.  Frustrated with chronic/ongoing allergies, now worse.  Denies changes in environment, new allergen exposures.  Past Medical History  Diagnosis Date  . Depression   . Anxiety   . Insomnia   . Vitamin D deficiency   . Allergic rhinitis, cause unspecified   . 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 Ankle surgery  child    left for torn ligaments, left  . Pilonidal cyst excision    . Cervical conization w/bx  2001   History   Social History  . Marital Status: Single    Spouse Name: N/A    Number of Children: 0  . Years of Education: N/A   Occupational History  . Holiday representative   Social History Main Topics  . Smoking status: Never Smoker   .  Smokeless tobacco: Never Used  . Alcohol Use: No  . Drug Use: No  . Sexually Active: Yes -- Female partner(s)   Other Topics Concern  . Not on file   Social History Narrative   Lives with female partner, 1 dog (beagle)   Current Outpatient Prescriptions on File Prior to Visit  Medication Sig Dispense Refill  . buPROPion (WELLBUTRIN XL) 300 MG 24 hr tablet Take 1 tablet (300 mg total) by mouth every morning.  30 tablet  11  . citalopram (CELEXA) 20 MG tablet Take 10 mg by mouth daily.      . fluticasone (FLONASE) 50 MCG/ACT nasal spray instill 2 sprays into each nostril once daily  16 g  2  . Guaifenesin (MUCINEX MAXIMUM STRENGTH) 1200 MG TB12 Take 1 tablet by mouth daily.      . montelukast (SINGULAIR) 10 MG tablet Take 1 tablet (10 mg total) by mouth at bedtime.  30 tablet  11  . olopatadine (PATANOL) 0.1 % ophthalmic solution Place 1 drop into both eyes 2 (two) times daily.  5 mL  12  . Pseudoephedrine HCl (SUDAFED 12 HOUR PO) Take 1 tablet by mouth daily.      Marland Kitchen XYZAL 5 MG tablet Take 1 tablet (5 mg total) by mouth every evening.  30 tablet  12  . zolpidem (AMBIEN) 10 MG tablet take 1 tablet by mouth at bedtime if needed for  sleep  30 tablet  0  . ALPRAZolam (XANAX) 0.5 MG tablet Take 0.5 mg by mouth as needed.       No current facility-administered medications on file prior to visit.   Allergies  Allergen Reactions  . Codeine Itching and Nausea And Vomiting   ROS:  Denies nausea, vomiting, diarrhea, lightheadedness.  Denies fevers, chest pain, shortness of breath, edema, rash.  See HPI  PHYSICAL EXAM: BP 120/88  Pulse 72  Temp(Src) 98.4 F (36.9 C) (Oral)  Ht 5' 4.5" (1.638 m)  Wt 156 lb (70.761 kg)  BMI 26.37 kg/m2  LMP 11/18/2012 Well developed, pleasant female, with occasional throat clearing, slight dry cough HEENT:  PERRL, conjunctiva clear.  Nasal mucosa moderately edematous with some erythema and yellow crusting. Sinuses nontender.  OP clear Neck: no  lymphadenopathy, or mass Heart: regular rate and rhythm without murmur Lungs: clear bilaterally Skin: no rash Psych: normal mood, affect, hygiene and grooming  ASSESSMENT/PLAN:  Acute upper respiratory infections of unspecified site - Plan: azithromycin (ZITHROMAX) 250 MG tablet  Allergic rhinitis, cause unspecified - Plan: Ciclesonide (ZETONNA) 37 MCG/ACT AERS  Allergies with concurrent URI.  No bacterial infection at present. Start antibiotics in 3-5 days if symptoms persist/worsen.  Continue singulair, antihistamines (consider changing up), mucinex, decongestants prn, sinus rinses.  Change from Flonase to [place]. Proper use of nasal steroids reviewed (sniff gently) Consider referral to allergist.  Allergen exposure/prevention measures reviewed.  Reasons for referral and expectations reviewed.  If her URI resolves, and control of allergies is acceptable with her current regimen, then just continue.  If suboptimal control of allergies, call for referral to allergist.  25 minute visit, more than 1/2 spent counseling.  All questions answered.  Risks and side effects of meds reviewed.

## 2012-12-28 ENCOUNTER — Other Ambulatory Visit: Payer: Self-pay | Admitting: *Deleted

## 2012-12-28 ENCOUNTER — Telehealth: Payer: Self-pay | Admitting: *Deleted

## 2012-12-28 DIAGNOSIS — J309 Allergic rhinitis, unspecified: Secondary | ICD-10-CM

## 2012-12-28 NOTE — Telephone Encounter (Signed)
Ok to refer to allergist, if clearly is allergies.  If having discolored mucus, may need change in ABX

## 2012-12-28 NOTE — Telephone Encounter (Signed)
Called patient to let her know that she is scheduled at [facility], with Dr.VanWinkle for 01/11/13 @ 11:15. Faxed records to [phone].

## 2012-12-28 NOTE — Telephone Encounter (Signed)
Left message for patient to return my call.

## 2012-12-28 NOTE — Telephone Encounter (Signed)
Patient called and left message on my voicemail stating that at her last visit she was told that the next step would be a referral to an allergist, she would like to go ahead with this referral if that is okay. Please advise, thanks.

## 2012-12-30 ENCOUNTER — Other Ambulatory Visit: Payer: Self-pay | Admitting: Family Medicine

## 2013-01-31 ENCOUNTER — Other Ambulatory Visit: Payer: Self-pay | Admitting: Family Medicine

## 2013-01-31 NOTE — Telephone Encounter (Signed)
Is it okay to refill Ambien? 

## 2013-01-31 NOTE — Telephone Encounter (Signed)
Is this ok?

## 2013-01-31 NOTE — Telephone Encounter (Signed)
Okay for #30 zolpidem with no refill, and 6 refills of the flonase

## 2013-03-30 ENCOUNTER — Other Ambulatory Visit: Payer: Self-pay | Admitting: Family Medicine

## 2013-03-30 ENCOUNTER — Telehealth: Payer: Self-pay | Admitting: *Deleted

## 2013-03-30 NOTE — Telephone Encounter (Signed)
Ok for #30, no refill.  She has no visits scheduled.  Last CPE was 04/2012.  At least needs a med check scheduled for further refills of controlled substance.  Or can schedule CPE and get add'l refills at her visit

## 2013-03-30 NOTE — Telephone Encounter (Signed)
Called patient to let her know that I received a refill request for her Ambien, Dr.Knapp ok'd me to call in #30 with no refills to [facility] on [address]. Left message for patient asking her to please call and schedule appt. Her last CPE was 04/2012. I am aware that Dr.Knapp's CPE's are booking into December so if she wants to go ahead and schedule a CPE for Dec and can wait for refills of her Ambien at that time that is fine, if she feels she will need a refill before that time she will need to go ahead and schedule a med check in the next month or two as this rx is a controlled substance.

## 2013-03-30 NOTE — Telephone Encounter (Signed)
Is this okay to refill? 

## 2013-04-25 ENCOUNTER — Other Ambulatory Visit: Payer: Self-pay | Admitting: Family Medicine

## 2013-04-30 ENCOUNTER — Other Ambulatory Visit: Payer: Self-pay | Admitting: Family Medicine

## 2013-05-08 ENCOUNTER — Ambulatory Visit (INDEPENDENT_AMBULATORY_CARE_PROVIDER_SITE_OTHER): Payer: BC Managed Care – PPO | Admitting: Family Medicine

## 2013-05-08 ENCOUNTER — Encounter: Payer: Self-pay | Admitting: Family Medicine

## 2013-05-08 VITALS — BP 120/72 | HR 72 | Ht 64.5 in | Wt 162.0 lb

## 2013-05-08 DIAGNOSIS — J309 Allergic rhinitis, unspecified: Secondary | ICD-10-CM

## 2013-05-08 DIAGNOSIS — G4700 Insomnia, unspecified: Secondary | ICD-10-CM

## 2013-05-08 DIAGNOSIS — M545 Low back pain: Secondary | ICD-10-CM

## 2013-05-08 DIAGNOSIS — H1045 Other chronic allergic conjunctivitis: Secondary | ICD-10-CM

## 2013-05-08 DIAGNOSIS — F329 Major depressive disorder, single episode, unspecified: Secondary | ICD-10-CM

## 2013-05-08 DIAGNOSIS — H1010 Acute atopic conjunctivitis, unspecified eye: Secondary | ICD-10-CM

## 2013-05-08 DIAGNOSIS — R3915 Urgency of urination: Secondary | ICD-10-CM

## 2013-05-08 DIAGNOSIS — F411 Generalized anxiety disorder: Secondary | ICD-10-CM

## 2013-05-08 DIAGNOSIS — F419 Anxiety disorder, unspecified: Secondary | ICD-10-CM

## 2013-05-08 LAB — POCT URINALYSIS DIPSTICK
Bilirubin, UA: NEGATIVE
Nitrite, UA: NEGATIVE
pH, UA: 5

## 2013-05-08 MED ORDER — BUPROPION HCL ER (XL) 300 MG PO TB24: 300.0000 mg | ORAL_TABLET | ORAL | Status: DC

## 2013-05-08 MED ORDER — ALPRAZOLAM 0.5 MG PO TABS: 0.2500 mg | ORAL_TABLET | Freq: Three times a day (TID) | ORAL | Status: DC | PRN

## 2013-05-08 MED ORDER — OLOPATADINE HCL 0.1 % OP SOLN: 1.0000 [drp] | Freq: Two times a day (BID) | OPHTHALMIC | Status: AC

## 2013-05-08 MED ORDER — LEVOCETIRIZINE DIHYDROCHLORIDE 5 MG PO TABS: 5.0000 mg | ORAL_TABLET | Freq: Every evening | ORAL | Status: DC

## 2013-05-08 MED ORDER — CITALOPRAM HYDROBROMIDE 20 MG PO TABS: ORAL_TABLET | ORAL | Status: DC

## 2013-05-08 NOTE — Progress Notes (Signed)
Chief Complaint  Patient presents with  . Med check    for anxiety/sleep meds. Also has been having some LBP.   Anxiety is doing well.  Uses xanax infrequently, once every couple of weeks. She needs this refilled today.  Citalopram was added last year to the wellbutrin, and she has only needed to take 1/2 tablet (never needed to increase to full tablet).  Denies side effects.  She is having increased insomnia recently, needing to take ambien at least 3-4 times/week. Usually can just use 1/2 tablet, but sometimes requires full tablet.  She "doesn't feel right"--having some GI cramping, softer stools, more gas than usual.  Denies change in her diet.  She changed to lactose-free milk in the last 2 weeks, but still getting cheese in her diet; thinks eliminating milk helped some.  She has used melatonin, and that helped some.  She got a different brand and isn't as effective as the prior brand of melatonin she used.  Also having some recurrent back pain, since May.  She saw a chiropractor, and after a month felt much better.  She then broke a bone in her foot, but that has resolved.  Back pain has returned, although not as severe. Her period started 8/22, lasted 2 days, and now having some recurrent back pain. She thinks these things may be somehow tied together.  Hurts to bend forward.  Denies any radiation of back pain--stays in left lower back.  Thinks she had some abdominal discomfort a few days ago also.  She has taken Tylenol, which helps some with the pain, but doesn't help with sleep at all (regular tylenol, not PM).  Hasn't seen chiropractor in the last month or so.  Denies any dysuria, sometimes has some urgency of small amounts.  She started allergy shots.  No longer taking sudafed and mucinex, and added patanase.  Doing well overall from allergies/sinus standpoint.  Past Medical History  Diagnosis Date  . Depression   . Anxiety   . Insomnia   . Vitamin D deficiency   . Allergic rhinitis,  cause unspecified   . 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 Ankle surgery  child    left for torn ligaments, left  . Pilonidal cyst excision    . Cervical conization w/bx  2001   History   Social History  . Marital Status: Single    Spouse Name: N/A    Number of Children: 0  . Years of Education: N/A   Occupational History  . Holiday representative   Social History Main Topics  . Smoking status: Never Smoker   . Smokeless tobacco: Never Used  . Alcohol Use: No  . Drug Use: No  . Sexual Activity: Yes    Partners: Female   Other Topics Concern  . Not on file   Social History Narrative   Lives with female partner, 1 dog (beagle)   Current outpatient prescriptions:ALPRAZolam (XANAX) 0.5 MG tablet, Take 0.5-1 tablets (0.25-0.5 mg total) by mouth 3 (three) times daily as needed for anxiety., Disp: 20 tablet, Rfl: 0;  buPROPion (WELLBUTRIN XL) 300 MG 24 hr tablet, Take 1 tablet (300 mg total) by mouth every morning., Disp: 30 tablet, Rfl: 11;  citalopram (CELEXA) 20 MG tablet, take 1 tablet by mouth once daily, Disp: 30 tablet, Rfl: 11 fluticasone (FLONASE) 50 MCG/ACT nasal spray, instill 2 sprays into each nostril once daily,  Disp: 16 g, Rfl: 6;  levocetirizine (XYZAL) 5 MG tablet, Take 1 tablet (5 mg total) by mouth every evening., Disp: 30 tablet, Rfl: 12;  montelukast (SINGULAIR) 10 MG tablet, take 1 tablet by mouth at bedtime, Disp: 30 tablet, Rfl: 11;  olopatadine (PATANOL) 0.1 % ophthalmic solution, Place 1 drop into both eyes 2 (two) times daily., Disp: 5 mL, Rfl: 12 Olopatadine HCl (PATANASE) 0.6 % SOLN, Place 1-2 each into the nose 2 (two) times daily., Disp: , Rfl: ;  zolpidem (AMBIEN) 10 MG tablet, take 1 tablet by mouth at bedtime if needed for sleep, Disp: 30 tablet, Rfl: 0;  Pseudoephedrine HCl (SUDAFED 12 HOUR PO), Take 1 tablet by mouth daily., Disp: , Rfl:   Allergies  Allergen Reactions   . Codeine Itching and Nausea And Vomiting    ROS:  Denies fevers, chills, headaches, dizziness, chest pain, shortness of breath.  Allergies are controlled.  Denies nausea/vomiting; bowel changes as per HPI.  No blood in stool.  No skin rashes, bleeding/bruising.  +insomnia.  See hPI  PHYSICAL EXAM: BP 120/72  Pulse 72  Ht 5' 4.5" (1.638 m)  Wt 162 lb (73.483 kg)  BMI 27.39 kg/m2  LMP 05/05/2013 Well developed, well-appearing female in no distress Neck: no lymphadenopathy, thyromegaly or mass Heart: regular rate and rhythm without murmur Lungs clear bilaterally Back: no spinal tenderness, no CVA tenderness.  Mildly tender at L lumbar paraspinous muscles Abdomen: soft, nontender, no organomegaly or mass Extremities: no edema Skin: no rash Psych: normal mood, affect, hygiene and grooming; speech and eye contact normal  Urine dip: trace blood.  SG <1.005  ASSESSMENT/PLAN:  LBP (low back pain) - r/o infection.  appears to have musculoskeletal component - Plan: POCT Urinalysis Dipstick, Urine culture  Insomnia  Anxiety - controlled. Continue current meds. - Plan: citalopram (CELEXA) 20 MG tablet, ALPRAZolam (XANAX) 0.5 MG tablet  Urinary urgency - Plan: Urine culture  Allergic conjunctivitis, unspecified laterality - Plan: olopatadine (PATANOL) 0.1 % ophthalmic solution  Allergic rhinitis, cause unspecified - stable on current regimen plus allergy shots - Plan: levocetirizine (XYZAL) 5 MG tablet  Depression - Plan: buPROPion (WELLBUTRIN XL) 300 MG 24 hr tablet  Insomnia--increased recently, possibly related to back discomfort.  Trial of aleve 1-2 tablets twice daily with food.  Go back to [employer].  Okay to continue to use [place] if needed, trying to still use sporadically and not daily.  Likely has some lactose intolerance contributing to some of her bowel complaints and pain.  Continue lactose-free milk, consider cutting back on cheese/other lactose sources in diet.   Use of simethicone prn gassy pain.  Return fasting for CPE--needs glucose (other labs okay, unless symptomatic).  25-30 minute visit, more than 1/2 spent counseling.

## 2013-05-08 NOTE — Patient Instructions (Addendum)
Remain lactose-free for now. Consider trying probiotic such as Align  I recommend trying Aleve (naproxen) 220mg , 1-2 tablets twice daily with food to help with back pain.  If you get side effects (you mentioned fluid retention) then start at just 1 tablet twice daily.  Consider restarting melatonin to help with sleep.   If you find that you need ambien every day, then we probably should consider changing to the CR version, which has FDA approval for longterm use (the plain ambien does not).  Follow up as scheduled for your physical, sooner if there are any other problems.  We are sending your urine for culture, and will let you know if you need antibiotics.

## 2013-05-09 ENCOUNTER — Encounter: Payer: Self-pay | Admitting: Family Medicine

## 2013-05-09 DIAGNOSIS — H1010 Acute atopic conjunctivitis, unspecified eye: Secondary | ICD-10-CM | POA: Insufficient documentation

## 2013-05-21 ENCOUNTER — Other Ambulatory Visit: Payer: Self-pay | Admitting: Family Medicine

## 2013-05-22 NOTE — Telephone Encounter (Signed)
Last rx'd 7/17.  Okay to refill #30, no refill

## 2013-05-22 NOTE — Telephone Encounter (Signed)
Is this okay to call in? 

## 2013-05-28 ENCOUNTER — Other Ambulatory Visit: Payer: Self-pay | Admitting: Family Medicine

## 2013-06-28 ENCOUNTER — Encounter: Payer: Self-pay | Admitting: Family Medicine

## 2013-06-28 ENCOUNTER — Ambulatory Visit (INDEPENDENT_AMBULATORY_CARE_PROVIDER_SITE_OTHER): Payer: BC Managed Care – PPO | Admitting: Family Medicine

## 2013-06-28 VITALS — BP 110/70 | HR 76 | Temp 98.2°F | Ht 64.5 in | Wt 165.0 lb

## 2013-06-28 DIAGNOSIS — J019 Acute sinusitis, unspecified: Secondary | ICD-10-CM

## 2013-06-28 DIAGNOSIS — J069 Acute upper respiratory infection, unspecified: Secondary | ICD-10-CM

## 2013-06-28 DIAGNOSIS — Z23 Encounter for immunization: Secondary | ICD-10-CM

## 2013-06-28 MED ORDER — AZITHROMYCIN 250 MG PO TABS: ORAL_TABLET | ORAL | Status: DC

## 2013-06-28 NOTE — Progress Notes (Signed)
Chief Complaint  Patient presents with  . Facial Pain    and teeth pain, B/L ear pain-no drainage x 2 weeks, worse over the past week.    Symptoms started 2 weeks ago, but getting worse over the last week.  Pain was intermittent at first, and now constant.  Not getting much drainage.  She started back on Mucinex about a week ago, using sudafed daily for a week, with some improvement, also taking benadryl.  She tried sinus rinses, which helps some, but didn't really get much out.  +PND.  Denies any cough. She has been using flonase regularly, as well as other allergy medications.  She sees Dr. Irena Cords for her allergies, and is on immunotherapy (for less than 6 months).  Past Medical History  Diagnosis Date  . Depression   . Anxiety   . Insomnia   . Vitamin D deficiency   . Allergic rhinitis, cause unspecified   . 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 Ankle surgery  child    left for torn ligaments, left  . Pilonidal cyst excision    . Cervical conization w/bx  2001   History   Social History  . Marital Status: Single    Spouse Name: N/A    Number of Children: 0  . Years of Education: N/A   Occupational History  . Holiday representative   Social History Main Topics  . Smoking status: Never Smoker   . Smokeless tobacco: Never Used  . Alcohol Use: No  . Drug Use: No  . Sexual Activity: Yes    Partners: Female   Other Topics Concern  . Not on file   Social History Narrative   Lives with female partner, 1 dog (beagle)   Current Outpatient Prescriptions on File Prior to Visit  Medication Sig Dispense Refill  . ALPRAZolam (XANAX) 0.5 MG tablet Take 0.5-1 tablets (0.25-0.5 mg total) by mouth 3 (three) times daily as needed for anxiety.  20 tablet  0  . buPROPion (WELLBUTRIN XL) 300 MG 24 hr tablet Take 1 tablet (300 mg total) by mouth every morning.  30 tablet  11  . citalopram (CELEXA) 20 MG  tablet take 1 tablet by mouth once daily  30 tablet  11  . fluticasone (FLONASE) 50 MCG/ACT nasal spray instill 2 sprays into each nostril once daily  16 g  6  . levocetirizine (XYZAL) 5 MG tablet take 1 tablet by mouth every evening  30 tablet  2  . montelukast (SINGULAIR) 10 MG tablet take 1 tablet by mouth at bedtime  30 tablet  11  . olopatadine (PATANOL) 0.1 % ophthalmic solution Place 1 drop into both eyes 2 (two) times daily.  5 mL  12  . Olopatadine HCl (PATANASE) 0.6 % SOLN Place 1-2 each into the nose 2 (two) times daily.      . Pseudoephedrine HCl (SUDAFED 12 HOUR PO) Take 1 tablet by mouth daily.      Marland Kitchen zolpidem (AMBIEN) 10 MG tablet take 1 tablet by mouth at bedtime if needed for sleep  30 tablet  0   No current facility-administered medications on file prior to visit.   Allergies  Allergen Reactions  . Codeine Itching and Nausea And Vomiting    ROS:  Denies fevers, chills, nausea, vomiting, diarrhea, skin rash, chest pain, shortness of breath, cough, or other concerns.  See HPI  PHYSICAL EXAM: BP 110/70  Pulse 76  Temp(Src) 98.2 F (36.8 C) (Oral)  Ht 5' 4.5" (1.638 m)  Wt 165 lb (74.844 kg)  BMI 27.9 kg/m2  LMP 06/23/2013  Well developed, pleasant female in no distress.  Frequent throat-clearing. HEENT:  PERRL, EOMI, conjunctiva clear.  TM's and EAC's normal.  Nasal mucosa mildly edematous, +erythema, white mucus.  OP clear.  Mild tenderness over L frontal sinus, very mild over bilateral maxillary sinuses Neck: no lymphadenopathy or masses Heart: regular rate and rhythm without murmur Lungs: clear bilaterally Skin: no rashes  ASSESSMENT/PLAN:  Acute upper respiratory infections of unspecified site  Acute sinusitis - Plan: azithromycin (ZITHROMAX) 250 MG tablet  Need for prophylactic vaccination and inoculation against influenza - Plan: Flu Vaccine QUAD 36+ mos IM  Continue supportive measures (mucinex, sinus rinses, decongestants prn). Continue allergy  medication Call/return if not improving in 5-7 days, sooner if worse.

## 2013-06-28 NOTE — Patient Instructions (Signed)
Continue everything that you are doing--the mucinex, sudafed, sinus rinses, and your routine allergy medications. Call in 5-7 days if symptoms are worsening rather than improving, to have antibiotic changed.

## 2013-07-10 ENCOUNTER — Other Ambulatory Visit: Payer: Self-pay | Admitting: Family Medicine

## 2013-07-10 ENCOUNTER — Telehealth: Payer: Self-pay | Admitting: Family Medicine

## 2013-07-10 ENCOUNTER — Telehealth: Payer: Self-pay | Admitting: *Deleted

## 2013-07-10 DIAGNOSIS — J019 Acute sinusitis, unspecified: Secondary | ICD-10-CM

## 2013-07-10 MED ORDER — AMOXICILLIN-POT CLAVULANATE 875-125 MG PO TABS: 1.0000 | ORAL_TABLET | Freq: Two times a day (BID) | ORAL | Status: DC

## 2013-07-10 NOTE — Telephone Encounter (Signed)
She was given z-pak, which should have remained in her system up until this weekend (10 days after starting z-pak).  Since it sounds like she started getting worse prior to this weekend, will change ABX rather than extending.  rx sent to pharmacy for Augmentin x 10 days.  Call on day 10 if not completely better to extend the course.  I want her to be COMPLETELY better before stopping the med.  Use probiotics if needed to help with diarrhea from ABX.

## 2013-07-10 NOTE — Telephone Encounter (Signed)
Ok to refill #30

## 2013-07-10 NOTE — Telephone Encounter (Signed)
Patient informed of Dr.Knapp's recommendation.

## 2013-07-10 NOTE — Telephone Encounter (Signed)
Is this okay to refill? 

## 2013-07-10 NOTE — Telephone Encounter (Signed)
Spoke with patient and she does feel like she did get somewhat better, then once abx were finished she gradually starting feeling worse. This weekend feels like symptoms were full force and back to where she had started.

## 2013-07-10 NOTE — Telephone Encounter (Signed)
Please call pt and see if it EVER got any better with the antibiotic (ie it got somewhat better, but worse since stopped it, or if it never got better at all)

## 2013-07-12 NOTE — Telephone Encounter (Signed)
Done

## 2013-07-21 ENCOUNTER — Telehealth: Payer: Self-pay | Admitting: Internal Medicine

## 2013-07-21 DIAGNOSIS — J019 Acute sinusitis, unspecified: Secondary | ICD-10-CM

## 2013-07-21 MED ORDER — AMOXICILLIN-POT CLAVULANATE 875-125 MG PO TABS: 1.0000 | ORAL_TABLET | Freq: Two times a day (BID) | ORAL | Status: DC

## 2013-07-21 NOTE — Telephone Encounter (Signed)
Pt states she has a sinus infection coming back. Still having pain in teeth and face and some drainage. Send another antibotic to [facility]

## 2013-07-21 NOTE — Telephone Encounter (Signed)
Pt notified med was sent

## 2013-07-21 NOTE — Telephone Encounter (Signed)
Advise pt that refill was sent.  Hopefully this extended course should resolve the infection completely.

## 2013-08-19 ENCOUNTER — Other Ambulatory Visit: Payer: Self-pay | Admitting: Family Medicine

## 2013-08-28 ENCOUNTER — Encounter: Payer: Self-pay | Admitting: Family Medicine

## 2013-08-28 ENCOUNTER — Ambulatory Visit (INDEPENDENT_AMBULATORY_CARE_PROVIDER_SITE_OTHER): Payer: BC Managed Care – PPO | Admitting: Family Medicine

## 2013-08-28 VITALS — BP 128/88 | HR 72 | Ht 64.0 in | Wt 164.0 lb

## 2013-08-28 DIAGNOSIS — F324 Major depressive disorder, single episode, in partial remission: Secondary | ICD-10-CM

## 2013-08-28 DIAGNOSIS — J309 Allergic rhinitis, unspecified: Secondary | ICD-10-CM

## 2013-08-28 DIAGNOSIS — Z79899 Other long term (current) drug therapy: Secondary | ICD-10-CM

## 2013-08-28 DIAGNOSIS — R635 Abnormal weight gain: Secondary | ICD-10-CM

## 2013-08-28 DIAGNOSIS — F325 Major depressive disorder, single episode, in full remission: Secondary | ICD-10-CM | POA: Insufficient documentation

## 2013-08-28 DIAGNOSIS — Z0000 Encounter for general adult medical examination without abnormal findings: Secondary | ICD-10-CM

## 2013-08-28 DIAGNOSIS — IMO0002 Reserved for concepts with insufficient information to code with codable children: Secondary | ICD-10-CM

## 2013-08-28 DIAGNOSIS — I7300 Raynaud's syndrome without gangrene: Secondary | ICD-10-CM

## 2013-08-28 LAB — POCT URINALYSIS DIPSTICK
Protein, UA: NEGATIVE
Urobilinogen, UA: NEGATIVE

## 2013-08-28 LAB — COMPREHENSIVE METABOLIC PANEL
ALT: 20 U/L (ref 0–35)
AST: 17 U/L (ref 0–37)
Albumin: 4.5 g/dL (ref 3.5–5.2)
CO2: 27 mEq/L (ref 19–32)
Calcium: 9.7 mg/dL (ref 8.4–10.5)
Chloride: 104 mEq/L (ref 96–112)
Glucose, Bld: 87 mg/dL (ref 70–99)
Potassium: 4.5 mEq/L (ref 3.5–5.3)
Sodium: 138 mEq/L (ref 135–145)
Total Bilirubin: 0.8 mg/dL (ref 0.3–1.2)
Total Protein: 7 g/dL (ref 6.0–8.3)

## 2013-08-28 LAB — CBC WITH DIFFERENTIAL/PLATELET
Lymphocytes Relative: 33 % (ref 12–46)
Lymphs Abs: 2.1 10*3/uL (ref 0.7–4.0)
MCHC: 34.1 g/dL (ref 30.0–36.0)
Neutro Abs: 3.4 10*3/uL (ref 1.7–7.7)
Neutrophils Relative %: 53 % (ref 43–77)
Platelets: 262 10*3/uL (ref 150–400)
RBC: 4.42 MIL/uL (ref 3.87–5.11)
WBC: 6.4 10*3/uL (ref 4.0–10.5)

## 2013-08-28 LAB — TSH: TSH: 0.871 u[IU]/mL (ref 0.350–4.500)

## 2013-08-28 MED ORDER — CITALOPRAM HYDROBROMIDE 20 MG PO TABS: 20.0000 mg | ORAL_TABLET | Freq: Every day | ORAL | Status: DC

## 2013-08-28 MED ORDER — LEVOCETIRIZINE DIHYDROCHLORIDE 5 MG PO TABS: ORAL_TABLET | ORAL | Status: DC

## 2013-08-28 NOTE — Patient Instructions (Addendum)
  HEALTH MAINTENANCE RECOMMENDATIONS:  It is recommended that you get at least 30 minutes of aerobic exercise at least 5 days/week (for weight loss, you may need as much as 60-90 minutes). This can be any activity that gets your heart rate up. This can be divided in 10-15 minute intervals if needed, but try and build up your endurance at least once a week.  Weight bearing exercise is also recommended twice weekly.  Eat a healthy diet with lots of vegetables, fruits and fiber.  "Colorful" foods have a lot of vitamins (ie green vegetables, tomatoes, red peppers, etc).  Limit sweet tea, regular sodas and alcoholic beverages, all of which has a lot of calories and sugar.  Up to 1 alcoholic drink daily may be beneficial for women (unless trying to lose weight, watch sugars).  Drink a lot of water.  Calcium recommendations are 1200-1500 mg daily (1500 mg for postmenopausal women or women without ovaries), and vitamin D 1000 IU daily.  This should be obtained from diet and/or supplements (vitamins), and calcium should not be taken all at once, but in divided doses.  Monthly self breast exams and yearly mammograms for women over the age of 2 is recommended.  Sunscreen of at least SPF 30 should be used on all sun-exposed parts of the skin when outside between the hours of 10 am and 4 pm (not just when at beach or pool, but even with exercise, golf, tennis, and yard work!)  Use a sunscreen that says "broad spectrum" so it covers both UVA and UVB rays, and make sure to reapply every 1-2 hours.  Remember to change the batteries in your smoke detectors when changing your clock times in the spring and fall.  Use your seat belt every time you are in a car, and please drive safely and not be distracted with cell phones and texting while driving.   Increase citalopram to full tablet daily. Continue all other medications. We will be in touch with your lab results (and let you know if you need to change your  vitamin D dose or not). Schedule your mammogram

## 2013-08-28 NOTE — Progress Notes (Signed)
Chief Complaint  Patient presents with  . Annual Exam    nonfasting annual exam (labs drawn this am) with pap. Would like to talk about circulation in her hands/feet-get cold easily and start turning white. FX left foot in July, saw Dr.Hiltz, was in a boot x 6 weeks-has been bothering her for several weeks. Irregular periods. Wants to discuss probiotic. Having some urinary issues ,urgency followed by not being able fully empty her bladder. Is taking 800mg  guafenisin about 4 times daily and feels this is working, is this ok?   Brandi Carpenter is a 47 y.o. female who presents for a complete physical.  She has the following concerns:  Depression/anxiety:  Her partner notices that she is getting angry easier in the last few months.  Pt admits to being irritable.  Denies crying, sadness, or significant anxiety or panic attacks.  She never increased the citalopram to full pill, as she had done well with starting at just 1/2 tablet.  Insomnia:  Uses ambien with good results.  Needs daily some weeks, and then sporadically (around 15 days/month), sometimes just needing 1/2 tablet  Allergies:  Fairly well controlled on her current regimen.  She thought the 12 hr mucinex might have interfered with her sleep, so she switched to the shorter-acting guaifenesin. It seems to keep mucus thinner, but she is actually taking more than 2400mg  per day (taking two 400mg  tablets every 4-6 hours, about 3-4 times/day).  Mucus in stool--she was referred to GI last year due to changes in bowel habits.  It improved while she was taking probiotic, but recurred.  It was felt that she could wait until age 21 to have colonoscopy.  Mucus and gassiness recurred since stopping the probiotic; she is wondering if she should restart/safe to continue to use.  She restarted using [employer]itness[employer] app about 2 months ago to help lose weight that she had regained in the last 2 years (previously had lost 20 pounds doing that, but regained 10).  She  feels like she has continued to gain weight despite these measures.  Feet and hands have been getting cold and white.  She will sometimes feel a finger or toe go numb, and it looks completely white.  This has been going on for several years.  It has never looked red or blue, not painful.  Denies rashes, joint pains, or other signs of autoimmune disorder.  Urinary symptoms fluctuate--sometimes she feels like her stomach feels swollen, like she has to change position in order to get the urine to start flowing, or really strain.  She has been trying to avoid decongestants, but still needs to use periodically.  Bump on right medial foot at arch x 2 years.  Hasn't changed in size, not painful or tender  Immunization History  Administered Date(s) Administered  . Influenza,inj,Quad PF,36+ Mos 06/28/2013  . Tdap 05/11/2012   Last Pap smear: 04/2012 Last mammogram: years ago (likely prior to '06)  Last colonoscopy: never  Last DEXA: never  Dentist: twice a year Ophtho: 3 years ago  Exercise:  Limited by foot fracture in July. She continues to do yardwork 1-2x/week.  She does [employer] on [address], 30 minutes, twice a week.  Past Medical History  Diagnosis Date  . Depression   . Anxiety   . Insomnia   . Vitamin D deficiency   . Allergic rhinitis, cause unspecified   . Abnormal Pap smear     ASCUS 08/1999; CIN 11/1999, s/p conization  Past Surgical History  Procedure Laterality Date  . Ankle surgery  child    left for torn ligaments, left  . Pilonidal cyst excision    . Cervical conization w/bx  2001    History   Social History  . Marital Status: Single    Spouse Name: N/A    Number of Children: 0  . Years of Education: N/A   Occupational History  . Holiday representative   Social History Main Topics  . Smoking status: Never Smoker   . Smokeless tobacco: Never Used  . Alcohol Use: No  . Drug Use: No  . Sexual Activity: Yes    Partners: Female   Other Topics Concern   . Not on file   Social History Narrative   Lives with female partner, 2 beagles    Family History  Problem Relation Age of Onset  . Hypertension Mother   . Diabetes Mother   . Heart disease Father   . Depression Sister   . Colon cancer Maternal Grandmother      died at 18  . Diabetes Maternal Grandmother   . Breast cancer Cousin   . Alzheimer's disease Paternal Grandmother   . Colon polyps Maternal Uncle     Current outpatient prescriptions:ALPRAZolam (XANAX) 0.5 MG tablet, Take 0.5-1 tablets (0.25-0.5 mg total) by mouth 3 (three) times daily as needed for anxiety., Disp: 20 tablet, Rfl: 0;  buPROPion (WELLBUTRIN XL) 300 MG 24 hr tablet, Take 1 tablet (300 mg total) by mouth every morning., Disp: 30 tablet, Rfl: 11;  Cholecalciferol (VITAMIN D) 2000 UNITS tablet, Take 2,000 Units by mouth daily., Disp: , Rfl:  citalopram (CELEXA) 20 MG tablet, Take 1 tablet (20 mg total) by mouth daily. take 1 tablet by mouth once daily, Disp: 1 tablet, Rfl: 0;  fluticasone (FLONASE) 50 MCG/ACT nasal spray, instill 2 sprays into each nostril once daily, Disp: 16 g, Rfl: 2;  levocetirizine (XYZAL) 5 MG tablet, take 1 tablet by mouth every evening, Disp: 30 tablet, Rfl: 2;  montelukast (SINGULAIR) 10 MG tablet, take 1 tablet by mouth at bedtime, Disp: 30 tablet, Rfl: 11 olopatadine (PATANOL) 0.1 % ophthalmic solution, Place 1 drop into both eyes 2 (two) times daily., Disp: 5 mL, Rfl: 12;  Olopatadine HCl (PATANASE) 0.6 % SOLN, Place 1-2 each into the nose 2 (two) times daily., Disp: , Rfl: ;  Omega-3 Fatty Acids (FISH OIL) 1200 MG CAPS, Take 2,400 mg by mouth daily., Disp: , Rfl: ;  pseudoephedrine (SUDAFED) 30 MG tablet, Take 30 mg by mouth every 4 (four) hours as needed for congestion., Disp: , Rfl:  zolpidem (AMBIEN) 10 MG tablet, take 1 tablet by mouth at bedtime if needed for sleep, Disp: 30 tablet, Rfl: 0;  GuaiFENesin (MUCINEX PO), Take 800 mg by mouth 4 (four) times daily., Disp: , Rfl:   Allergies   Allergen Reactions  . Codeine Itching and Nausea And Vomiting   ROS: The patient denies anorexia, fever, headaches, vision changes, decreased hearing, ear pain, sore throat, breast concerns, chest pain, palpitations, dizziness, syncope, dyspnea on exertion, cough, swelling, nausea, vomiting, diarrhea, constipation, abdominal pain, melena, hematochezia, indigestion/heartburn, hematuria, incontinence, dysuria, irregular menstrual cycles, vaginal discharge, odor or itch, genital lesions, joint pains, numbness, tingling, weakness, tremor, suspicious skin lesions, abnormal bleeding/bruising, or enlarged lymph nodes.  Periods are light, lasts just 2-3 days.  10 pound weight gain in the last year +mucusy stools and gassiness recurred since stopping probiotic  PHYSICAL EXAM: BP 128/88  Pulse 72  Ht 5\' 4"  (1.626 m)  Wt 164 lb (74.39 kg)  BMI 28.14 kg/m2  LMP 07/28/2013  General Appearance:  Alert, cooperative, no distress, appears stated age.  Head:  Normocephalic, without obvious abnormality, atraumatic   Eyes:  PERRL, conjunctiva/corneas clear, EOM's intact, fundi  benign   Ears:  Normal TM's and external ear canals   Nose:  Nares normal, mucosa only mildly edematous, no drainage or sinus tenderness   Throat:  Lips, mucosa, and tongue normal; teeth and gums normal   Neck:  Supple, no lymphadenopathy; thyroid: no enlargement/tenderness/nodules; no carotid  bruit or JVD   Back:  Spine nontender, no curvature, ROM normal, no CVA tenderness   Lungs:  Clear to auscultation bilaterally without wheezes, rales or ronchi; respirations unlabored   Chest Wall:  No tenderness or deformity   Heart:  Regular rate and rhythm, S1 and S2 normal, no murmur, rub  or gallop   Breast Exam:  No tenderness, masses, or nipple discharge or inversion. No axillary lymphadenopathy. Some fibroglandular changes and mild tenderness bilaterally, especially UOQ's. No dominant masses  Abdomen:  Soft, non-tender,  nondistended, normoactive bowel sounds,  no masses, no hepatosplenomegaly   Genitalia:  Normal external genitalia without lesions. BUS and vagina normal; no cervical motion tenderness. No abnormal vaginal discharge. Uterus and adnexa not enlarged, nontender, no masses. Pap not performed   Rectal:  Normal tone, no masses or tenderness;  Small rectocele noted. No significant amount of stool in vault; smear is heme negative  Extremities:  No clubbing, cyanosis or edema   Pulses:  2+ and symmetric all extremities   Skin:  Skin color, texture, turgor normal, no rashes. 0.5 cm sq nodule at medial arch of right foot, nontender  Lymph nodes:  Cervical, supraclavicular, and axillary nodes normal   Neurologic:  CNII-XII intact, normal strength, sensation and gait; reflexes 2+ and symmetric throughout          Psych: Normal mood, affect, hygiene and grooming.   ASSESSMENT/PLAN:  Routine general medical examination at a health care facility - Plan: POCT Urinalysis Dipstick, Visual acuity screening, Comprehensive metabolic panel, CBC with Differential, TSH, Vit D  25 hydroxy (rtn osteoporosis monitoring)  Allergic rhinitis, cause unspecified - improved control - Plan: levocetirizine (XYZAL) 5 MG tablet  Encounter for long-term (current) use of other medications - Plan: Comprehensive metabolic panel, CBC with Differential, Vit D  25 hydroxy (rtn osteoporosis monitoring)  Weight gain - reviewed, diet, exercise.  r/o thyroid disease - Plan: TSH  Depression, major, in partial remission - suboptimally controlled on current regimen.  continue welbutrin; increase citalopram to full 20mg  tablet. - Plan: citalopram (CELEXA) 20 MG tablet  Raynaud phenomenon - no evidence of co-existing autoimmune disease.  reassured, preventative techniques reviewed.  Nodule on R foot--continue to monitor.  Return for re-eval if increasing in size, becomes painful, or other concern.  Allergies:  Improved control.  Advised to not  exceed maximum dosage of guaifenesin (likely 2400mg /day).  Urinary complaints--might be related to decongestants (will pay closer attention to see if related).  She also has a small rectocele, that if constipated might also contribute to her difficulty voiding.  Recurrence of mucous in stools and gassiness since stopping the probiotic.  Encouraged to restart, and safe to continue longterm.  Reassured that if there was a more significant underlying cause to her bowel changes, they would not have improved with probiotics.  F/u 1 year, sooner prn, especially if she doesn't see mood improvement within 4-6 weeks  after increasing citalopram dose.

## 2013-08-29 ENCOUNTER — Encounter: Payer: Self-pay | Admitting: Family Medicine

## 2013-09-05 ENCOUNTER — Other Ambulatory Visit: Payer: Self-pay | Admitting: Family Medicine

## 2013-09-05 NOTE — Telephone Encounter (Signed)
IS THIS OKAY 

## 2013-09-05 NOTE — Telephone Encounter (Signed)
Ok for #30, no refill 

## 2013-10-19 ENCOUNTER — Other Ambulatory Visit: Payer: Self-pay | Admitting: Family Medicine

## 2013-10-19 NOTE — Telephone Encounter (Signed)
Is this okay to refill? 

## 2013-10-19 NOTE — Telephone Encounter (Signed)
[facility] for #30, no refill (reviewed last CPE, and how she takes it)

## 2013-11-20 ENCOUNTER — Ambulatory Visit (INDEPENDENT_AMBULATORY_CARE_PROVIDER_SITE_OTHER): Payer: BC Managed Care – PPO | Admitting: Family Medicine

## 2013-11-20 ENCOUNTER — Encounter: Payer: Self-pay | Admitting: Family Medicine

## 2013-11-20 VITALS — BP 128/84 | HR 72 | Temp 98.7°F | Ht 64.0 in | Wt 159.0 lb

## 2013-11-20 DIAGNOSIS — J069 Acute upper respiratory infection, unspecified: Secondary | ICD-10-CM

## 2013-11-20 NOTE — Progress Notes (Signed)
Chief Complaint  Patient presents with  . Cough    that started with a sinus headache this past Friday. Felt worse by Sunday, chest is really hurting her. Mucus that she ahs coughed up is yellowish but what she blows out her nose is clear.    3 days ago started with headache behind her eyes.  She thought at first it was a typical cold, with sore throat and congestion.  Yesterday she started having pain in her chest, and coughing more.  She had temp of 100.7 yesterday.  Nasal mucus is clear.  Cough is sometimes productive of yellow mucus. It hurts in the chest to cough.  Denies any shortness of breath.  She has been using sudafed for over a week, and uses mucinex chronically, along with all of her other allergy medications.  She has been seeing the allergist for the last six months or so, and is on immunotherapy.  Past Medical History  Diagnosis Date  . Depression   . Anxiety   . Insomnia   . Vitamin D deficiency   . Allergic rhinitis, cause unspecified   . 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 Ankle surgery  child    left for torn ligaments, left  . Pilonidal cyst excision    . Cervical conization w/bx  2001   History   Social History  . Marital Status: Single    Spouse Name: N/A    Number of Children: 0  . Years of Education: N/A   Occupational History  . Facilities manager   Social History Main Topics  . Smoking status: Never Smoker   . Smokeless tobacco: Never Used  . Alcohol Use: No  . Drug Use: No  . Sexual Activity: Yes    Partners: Female   Other Topics Concern  . Not on file   Social History Narrative   Lives with female partner, 2 beagles   Outpatient Encounter Prescriptions as of 11/20/2013  Medication Sig Note  . buPROPion (WELLBUTRIN XL) 300 MG 24 hr tablet Take 1 tablet (300 mg total) by mouth every morning.   . Cholecalciferol (VITAMIN D) 2000 UNITS tablet Take 2,000  Units by mouth daily.   . citalopram (CELEXA) 20 MG tablet Take 1 tablet (20 mg total) by mouth daily. take 1 tablet by mouth once daily   . fluticasone (FLONASE) 50 MCG/ACT nasal spray instill 2 sprays into each nostril once daily   . GuaiFENesin (MUCINEX PO) Take 800 mg by mouth 4 (four) times daily.   Marland Kitchen levocetirizine (XYZAL) 5 MG tablet take 1 tablet by mouth every evening   . montelukast (SINGULAIR) 10 MG tablet take 1 tablet by mouth at bedtime   . olopatadine (PATANOL) 0.1 % ophthalmic solution Place 1 drop into both eyes 2 (two) times daily.   . Olopatadine HCl (PATANASE) 0.6 % SOLN Place 1-2 each into the nose 2 (two) times daily.   . Omega-3 Fatty Acids (FISH OIL) 1200 MG CAPS Take 2,400 mg by mouth daily.   . pseudoephedrine (SUDAFED) 30 MG tablet Take 30 mg by mouth every 4 (four) hours as needed for congestion.   Marland Kitchen ALPRAZolam (XANAX) 0.5 MG tablet Take 0.5-1 tablets (0.25-0.5 mg total) by mouth 3 (three) times daily as needed for anxiety. 08/28/2013: Uses before dentist, just rarely as needed  . zolpidem (AMBIEN) 10 MG tablet take 1 tablet by mouth at  bedtime if needed for sleep    Allergies  Allergen Reactions  . Codeine Itching and Nausea And Vomiting    ROS:  Denies nausea, vomiting, diarrhea, rashes, bleeding, bruising.  +cough, mild chest pain with coughing.  No ear pain, sore throat or other concerns except as per HPI.  PHYSICAL EXAM: BP 128/84  Pulse 72  Temp(Src) 98.7 F (37.1 C) (Oral)  Ht 5\' 4"  (1.626 m)  Wt 159 lb (72.122 kg)  BMI 27.28 kg/m2  LMP 11/10/2013 Well developed, well-appearing female in no distress HEENT:  PERRL, EOMI, conjunctiva is clear.  Nasal mucosa--very red with inflammation and recent bleeding on R septum. No purulence.  OP is clear, no erythema, moist mucus membranes. Sinuses nontender TM's and EAC's normal bilaterally. Neck: mild shotty, nontender anterior cervical lymphadenopathy L>R Lungs: clear bilaterally Heart: regular rate and  rhythm Skin: no rashes  ASSESSMENT/PLAN:  Acute upper respiratory infections of unspecified site  Suspect viral URI, in a patient with chronic allergies.  Drink plenty of fluids. continue sinus rinses, nasal saline spray/gel Continue Mucinex and sudafed/decongestant. Continue tylenol and/or advil as needed for fever, pain  Call later this week or next week if symptoms persist or worse.  Expect it to take another 3-5 days before you feel significantly better. May need ABX if persistent fevers, purulent phlegm, symptoms persist/worsen rather than improve.

## 2013-11-20 NOTE — Patient Instructions (Signed)
  Drink plenty of fluids. continue sinus rinses, nasal saline spray/gel Continue Mucinex and sudafed/decongestant. Continue tylenol and/or advil as needed for fever, pain  Call later this week or next week if symptoms persist or worse.  Expect it to take another 3-5 days before you feel significantly better.

## 2013-11-21 ENCOUNTER — Other Ambulatory Visit: Payer: Self-pay | Admitting: Family Medicine

## 2013-11-21 ENCOUNTER — Ambulatory Visit (INDEPENDENT_AMBULATORY_CARE_PROVIDER_SITE_OTHER): Payer: BC Managed Care – PPO | Admitting: Internal Medicine

## 2013-11-21 VITALS — BP 122/86 | HR 64 | Temp 97.8°F | Resp 16 | Ht 64.0 in | Wt 157.0 lb

## 2013-11-21 DIAGNOSIS — R05 Cough: Secondary | ICD-10-CM

## 2013-11-21 DIAGNOSIS — H6980 Other specified disorders of Eustachian tube, unspecified ear: Secondary | ICD-10-CM

## 2013-11-21 DIAGNOSIS — J209 Acute bronchitis, unspecified: Secondary | ICD-10-CM

## 2013-11-21 DIAGNOSIS — R059 Cough, unspecified: Secondary | ICD-10-CM

## 2013-11-21 MED ORDER — AZITHROMYCIN 250 MG PO TABS: ORAL_TABLET | ORAL | Status: DC

## 2013-11-21 MED ORDER — PREDNISONE 10 MG PO TABS: ORAL_TABLET | ORAL | Status: DC

## 2013-11-21 MED ORDER — HYDROCODONE-ACETAMINOPHEN 7.5-325 MG/15ML PO SOLN: 5.0000 mL | Freq: Four times a day (QID) | ORAL | Status: DC | PRN

## 2013-11-21 NOTE — Patient Instructions (Signed)

## 2013-11-21 NOTE — Progress Notes (Signed)
   Subjective:    Patient ID: Brandi Carpenter, female    DOB: [birth year], 48 y.o.   MRN: [id number]  HPI 48 year old female presents with nasal congestion, cough, fever. Sunday she had a temperature of 100.7. The nasal congestion and sinus headache started Friday. Pt states the cough started on Sunday. When she cough she produces a yellowish phlegm. When she blows her nose she said it is clear mucous. Pt states she always has a lot of post nasal drainage. Pt states she has had a mild sore throat since Friday. Pt daily takes mucinex and flonase daily. She normally takes sudafed as needed, but since Friday she has been taking it daily. The cough has kept her up at night. Pt states since Saturday she had a hard time focusing on any task. No nausea or SOB.    Review of Systems     Objective:   Physical Exam  Vitals reviewed. Constitutional: She is oriented to person, place, and time. She appears well-developed and well-nourished. No distress.  HENT:  Head: Normocephalic.  Right Ear: External ear normal.  Left Ear: External ear normal.  Nose: Mucosal edema, rhinorrhea and sinus tenderness present.  Mouth/Throat: Oropharynx is clear and moist.  Eyes: EOM are normal. Pupils are equal, round, and reactive to light.  Neck: Normal range of motion. Neck supple.  Cardiovascular: Normal rate, regular rhythm and normal heart sounds.   Pulmonary/Chest: Effort normal. Not tachypneic. She has no decreased breath sounds. She has wheezes. She has rhonchi. She has no rales. She exhibits no tenderness.  Musculoskeletal: Normal range of motion.  Lymphadenopathy:    She has no cervical adenopathy.  Neurological: She is alert and oriented to person, place, and time. No cranial nerve deficit. She exhibits normal muscle tone. Coordination normal.  Psychiatric: She has a normal mood and affect. Her behavior is normal.          Assessment & Plan:  Bronchitis/ETD/DizzyURI Zpak/Prednisone/Lortab

## 2013-11-21 NOTE — Progress Notes (Signed)
   Subjective:    Patient ID: Brandi Carpenter, female    DOB: 10-06-1965, 48 y.o.   MRN: [id number]  HPI    Review of Systems     Objective:   Physical Exam        Assessment & Plan:

## 2013-11-28 ENCOUNTER — Ambulatory Visit (INDEPENDENT_AMBULATORY_CARE_PROVIDER_SITE_OTHER): Payer: BC Managed Care – PPO | Admitting: Physician Assistant

## 2013-11-28 VITALS — BP 102/68 | HR 89 | Temp 98.3°F | Resp 18 | Ht 64.0 in | Wt 159.0 lb

## 2013-11-28 DIAGNOSIS — J329 Chronic sinusitis, unspecified: Secondary | ICD-10-CM

## 2013-11-28 DIAGNOSIS — R059 Cough, unspecified: Secondary | ICD-10-CM

## 2013-11-28 DIAGNOSIS — R05 Cough: Secondary | ICD-10-CM

## 2013-11-28 DIAGNOSIS — J019 Acute sinusitis, unspecified: Secondary | ICD-10-CM

## 2013-11-28 MED ORDER — HYDROCODONE-ACETAMINOPHEN 7.5-325 MG/15ML PO SOLN: 5.0000 mL | Freq: Four times a day (QID) | ORAL | Status: DC | PRN

## 2013-11-28 MED ORDER — BENZONATATE 100 MG PO CAPS: ORAL_CAPSULE | ORAL | Status: AC

## 2013-11-28 MED ORDER — GUAIFENESIN ER 1200 MG PO TB12
1.0000 | ORAL_TABLET | Freq: Two times a day (BID) | ORAL | Status: AC
Start: 2013-11-28 — End: 2013-12-05

## 2013-11-28 MED ORDER — AMOXICILLIN 875 MG PO TABS: 875.0000 mg | ORAL_TABLET | Freq: Two times a day (BID) | ORAL | Status: DC

## 2013-11-28 NOTE — Patient Instructions (Signed)
If you are not better in regards to the head pressure and dizzy-like feeling, eye pressure is not better after the abx - let me know and we will order a sinus CT to determine if you had sinus issues.

## 2013-11-28 NOTE — Progress Notes (Signed)
   Subjective:    Patient ID: Brandi Carpenter, female    DOB: 1966-03-25, 48 y.o.   MRN: [id number]  HPI Pt presents to clinic with continued cough since she was treated with abx and prednisone 7 days ago.  She felt better quickly but now feels like she is getting worse though still better than when she was 1st seen.  Her cough is better but still present - feels like it is coming from her throat and she is constantly clearing her throat.  She feels SOB but wonders if she is just tired and has low energy.  She is not currently having headaches, face pain or teeth pain but does get that intermittently throughout the year.  She is on chronic allergy treatment including allergy injections even though her allergy skin test was negative.  She has chronic congestion and PND.  Her cough is worse during the day.  OTC meds - Mucinex  Review of Systems  Constitutional: Negative for fever and chills.  HENT: Positive for congestion and postnasal drip.   Respiratory: Positive for cough and shortness of breath. Negative for wheezing.        No h/o asthma, nonsmoker  Neurological: Negative for headaches.       Objective:   Physical Exam  Vitals reviewed. Constitutional: She is oriented to person, place, and time. She appears well-developed and well-nourished.  Patient constantly clears her throat during her visit.  HENT:  Head: Normocephalic and atraumatic.  Right Ear: Hearing, tympanic membrane, external ear and ear canal normal.  Left Ear: Hearing, tympanic membrane, external ear and ear canal normal.  Nose: Nose normal. Right sinus exhibits no maxillary sinus tenderness and no frontal sinus tenderness. Left sinus exhibits no maxillary sinus tenderness and no frontal sinus tenderness.  Mouth/Throat: Uvula is midline, oropharynx is clear and moist and mucous membranes are normal.  Eyes: Conjunctivae are normal.  Neck: Normal range of motion.  Cardiovascular: Normal rate, regular rhythm and normal  heart sounds.   No murmur heard. Pulmonary/Chest: Effort normal and breath sounds normal.  Lymphadenopathy:    She has no cervical adenopathy.  Neurological: She is alert and oriented to person, place, and time.  Skin: Skin is warm and dry.  Psychiatric: She has a normal mood and affect. Her behavior is normal. Judgment and thought content normal.      Assessment & Plan:  Cough - Plan: benzonatate (TESSALON) 100 MG capsule, HYDROcodone-acetaminophen (HYCET) 7.5-325 mg/15 ml solution  Sinus infection - Plan: amoxicillin (AMOXIL) 875 MG tablet, Guaifenesin (MUCINEX MAXIMUM STRENGTH) 1200 MG TB12  I think that the patient may have a sinus infection from her chronic congestion that the Z pack did not cover.   She will continue her Mucinex but stop the Sudafed and we will treat with high dose Abx and if she does not improved she will call because I have to question whether she is having a sinus problems vs chronic sinusitis with her intermittent face and teeth pain and chronic congestion.  Windell Hummingbird PA-C  [facility] 11/28/2013 5:05 PM

## 2013-12-13 ENCOUNTER — Telehealth: Payer: Self-pay

## 2013-12-13 DIAGNOSIS — J3489 Other specified disorders of nose and nasal sinuses: Secondary | ICD-10-CM

## 2013-12-13 NOTE — Telephone Encounter (Signed)
Done

## 2013-12-13 NOTE — Telephone Encounter (Signed)
Pt is wanting to talk with someone about getting a ct sinus referral   Best [phone]

## 2013-12-13 NOTE — Telephone Encounter (Signed)
Noted OV states to go ahead and order CT Sinus.  With or Without contrast?

## 2013-12-13 NOTE — Telephone Encounter (Signed)
Pt.notified

## 2013-12-18 ENCOUNTER — Ambulatory Visit
Admission: RE | Admit: 2013-12-18 | Discharge: 2013-12-18 | Disposition: A | Discharge status: Home / Self Care | Payer: BC Managed Care – PPO | Source: Ambulatory Visit | Attending: Physician Assistant | Admitting: Physician Assistant

## 2013-12-18 DIAGNOSIS — J3489 Other specified disorders of nose and nasal sinuses: Secondary | ICD-10-CM

## 2013-12-21 ENCOUNTER — Telehealth: Payer: Self-pay

## 2013-12-21 NOTE — Telephone Encounter (Signed)
Pt notified of her CT results

## 2014-01-19 ENCOUNTER — Other Ambulatory Visit: Payer: Self-pay | Admitting: Family Medicine

## 2014-01-19 NOTE — Telephone Encounter (Signed)
Called in med to [facility] 

## 2014-01-19 NOTE — Telephone Encounter (Signed)
Is this okay to call in? 

## 2014-01-19 NOTE — Telephone Encounter (Signed)
Is this ok to refill?  

## 2014-01-19 NOTE — Telephone Encounter (Signed)
Ok for #30, no refill 

## 2014-01-25 ENCOUNTER — Telehealth: Payer: Self-pay | Admitting: Family Medicine

## 2014-01-27 NOTE — Telephone Encounter (Signed)
P.A. ZOLPIDEM approved til 01/25/15, faxed pharmacy, pt informed

## 2014-02-19 ENCOUNTER — Other Ambulatory Visit: Payer: Self-pay | Admitting: Family Medicine

## 2014-04-04 ENCOUNTER — Other Ambulatory Visit: Payer: Self-pay | Admitting: Family Medicine

## 2014-04-04 NOTE — Telephone Encounter (Signed)
Ok to refill, #30, no refill

## 2014-04-04 NOTE — Telephone Encounter (Signed)
Is this okay?

## 2014-04-14 ENCOUNTER — Other Ambulatory Visit: Payer: Self-pay | Admitting: Family Medicine

## 2014-05-26 ENCOUNTER — Other Ambulatory Visit: Payer: Self-pay | Admitting: Family Medicine

## 2014-05-28 ENCOUNTER — Other Ambulatory Visit: Payer: Self-pay | Admitting: Family Medicine

## 2014-05-28 NOTE — Telephone Encounter (Signed)
ID THIS OKAY

## 2014-06-10 ENCOUNTER — Other Ambulatory Visit: Payer: Self-pay | Admitting: Family Medicine

## 2014-06-11 NOTE — Telephone Encounter (Signed)
Last filled in July.  Ok to refill #30, no refill

## 2014-06-11 NOTE — Telephone Encounter (Signed)
Is this okay to call in? 

## 2014-06-22 ENCOUNTER — Other Ambulatory Visit: Payer: Self-pay | Admitting: Family Medicine

## 2014-06-29 ENCOUNTER — Other Ambulatory Visit: Payer: Self-pay

## 2014-07-24 ENCOUNTER — Other Ambulatory Visit: Payer: Self-pay | Admitting: Family Medicine

## 2014-07-30 ENCOUNTER — Ambulatory Visit (INDEPENDENT_AMBULATORY_CARE_PROVIDER_SITE_OTHER): Payer: BC Managed Care – PPO | Admitting: Family Medicine

## 2014-07-30 ENCOUNTER — Encounter: Payer: Self-pay | Admitting: Family Medicine

## 2014-07-30 VITALS — BP 110/70 | HR 64 | Ht 64.0 in | Wt 164.0 lb

## 2014-07-30 DIAGNOSIS — F419 Anxiety disorder, unspecified: Secondary | ICD-10-CM

## 2014-07-30 DIAGNOSIS — F324 Major depressive disorder, single episode, in partial remission: Secondary | ICD-10-CM

## 2014-07-30 DIAGNOSIS — J309 Allergic rhinitis, unspecified: Secondary | ICD-10-CM

## 2014-07-30 DIAGNOSIS — G4700 Insomnia, unspecified: Secondary | ICD-10-CM

## 2014-07-30 DIAGNOSIS — F325 Major depressive disorder, single episode, in full remission: Secondary | ICD-10-CM

## 2014-07-30 DIAGNOSIS — Z0000 Encounter for general adult medical examination without abnormal findings: Secondary | ICD-10-CM

## 2014-07-30 LAB — LIPID PANEL
CHOL/HDL RATIO: 2.4 ratio
Cholesterol: 180 mg/dL (ref 0–200)
HDL: 74 mg/dL (ref >39)
LDL CALC: 91 mg/dL (ref 0–99)
Triglycerides: 76 mg/dL (ref <150)
VLDL: 15 mg/dL (ref 0–40)

## 2014-07-30 LAB — POCT URINALYSIS DIPSTICK
Bilirubin, UA: NEGATIVE
Glucose, UA: NEGATIVE
Ketones, UA: NEGATIVE
Leukocytes, UA: NEGATIVE
Nitrite, UA: NEGATIVE
PH UA: 6
Protein, UA: NEGATIVE
RBC UA: NEGATIVE
SPEC GRAV UA: 1.015
UROBILINOGEN UA: NEGATIVE

## 2014-07-30 LAB — TSH: TSH: 0.805 u[IU]/mL (ref 0.350–4.500)

## 2014-07-30 LAB — GLUCOSE, RANDOM: GLUCOSE: 74 mg/dL (ref 70–99)

## 2014-07-30 MED ORDER — ZOLPIDEM TARTRATE 10 MG PO TABS: ORAL_TABLET | ORAL | Status: DC

## 2014-07-30 MED ORDER — LEVOCETIRIZINE DIHYDROCHLORIDE 5 MG PO TABS: ORAL_TABLET | ORAL | Status: DC

## 2014-07-30 MED ORDER — CITALOPRAM HYDROBROMIDE 20 MG PO TABS
ORAL_TABLET | ORAL | Status: DC
Start: 2014-07-30 — End: 2015-08-01

## 2014-07-30 MED ORDER — OLOPATADINE HCL 0.1 % OP SOLN: 1.0000 [drp] | Freq: Two times a day (BID) | OPHTHALMIC | Status: DC

## 2014-07-30 MED ORDER — MONTELUKAST SODIUM 10 MG PO TABS: ORAL_TABLET | ORAL | Status: DC

## 2014-07-30 MED ORDER — BUPROPION HCL ER (XL) 300 MG PO TB24: ORAL_TABLET | ORAL | Status: DC

## 2014-07-30 MED ORDER — FLUTICASONE PROPIONATE 50 MCG/ACT NA SUSP: NASAL | Status: DC

## 2014-07-30 MED ORDER — ALPRAZOLAM 0.5 MG PO TABS: 0.2500 mg | ORAL_TABLET | Freq: Three times a day (TID) | ORAL | Status: DC | PRN

## 2014-07-30 NOTE — Progress Notes (Signed)
Chief Complaint  Patient presents with  . Annual Exam    fasting annual exam with pelvic exam. having some urinary issues and change in periods.    Brandi Carpenter is a 48 y.o. female who presents for a complete physical.  She has the following concerns:  Sometimes she has trouble starting her stream, even when she feels the urge to go.  She thinks symptoms are more prominent around her periods, fluctuate.  Sometimes she feels like she doesn't completely empty, and has to go again 30 mins later, but that is intermittent.  Denies hematuria, odor or pain with urination.  Depression/Anxiety: She reports moods are good on her current regimen.  Denies any side effects.  Irritability resolved after increasing citalopram to full tablet last year.  Insomnia: intermittent.  Uses 1/2-1 tablet of ambien as needed, with good results and no side effects.  Uses about 10/month.  Allergies: Under the care of allergist, is on immunotherapy.  She got a steroid shot and also needed prednisone taper due to fluid on ears.  She also ended up seeing ENT, was told everything was fine, including her hearing.  She was then referred to neurologist (by ENT) for further evaluation of headaches/teeth pain, facial pain, as he didn't feel it was related to her sinuses. She has appointment the end of December ([facility])  Immunization History  Administered Date(s) Administered  . Influenza Split 07/14/2014  . Influenza,inj,Quad PF,36+ Mos 06/28/2013  . Tdap 05/11/2012   Last Pap smear: 04/2012 Last mammogram: years ago (likely prior to '06)  Last colonoscopy: never  Last DEXA: never  Dentist: twice a year Ophtho: 4 years ago  Exercise:Zettie Pho, walks a few miles/week.  Past Medical History  Diagnosis Date  . Depression   . Anxiety   . Insomnia   . Vitamin D deficiency   . Allergic rhinitis, cause unspecified   . Abnormal Pap smear     ASCUS 08/1999; CIN 11/1999, s/p conization    Past  Surgical History  Procedure Laterality Date  . Ankle surgery  child    left for torn ligaments, left  . Pilonidal cyst excision    . Cervical conization w/bx  2001    History   Social History  . Marital Status: Single    Spouse Name: N/A    Number of Children: 0  . Years of Education: N/A   Occupational History  . Facilities manager   Social History Main Topics  . Smoking status: Never Smoker   . Smokeless tobacco: Never Used  . Alcohol Use: No  . Drug Use: No  . Sexual Activity:    Partners: Female   Other Topics Concern  . Not on file   Social History Narrative   Lives with female partner, 2 beagles    Family History  Problem Relation Age of Onset  . Hypertension Mother   . Diabetes Mother   . Heart disease Father   . Depression Sister   . Colon cancer Maternal Grandmother      died at 44  . Diabetes Maternal Grandmother   . Breast cancer Cousin   . Alzheimer's disease Paternal Grandmother   . Colon polyps Maternal Uncle     Outpatient Encounter Prescriptions as of 07/30/2014  Medication Sig Note  . ALPRAZolam (XANAX) 0.5 MG tablet Take 0.5-1 tablets (0.25-0.5 mg total) by mouth 3 (three) times daily as needed for anxiety.   Marland Kitchen buPROPion (WELLBUTRIN XL) 300 MG 24 hr tablet take  1 tablet by mouth every morning   . Cholecalciferol (VITAMIN D) 2000 UNITS tablet Take 2,000 Units by mouth daily.   . citalopram (CELEXA) 20 MG tablet take 1 tablet by mouth once daily   . fluticasone (FLONASE) 50 MCG/ACT nasal spray instill 2 sprays into each nostril once daily   . GuaiFENesin (MUCINEX PO) Take 800 mg by mouth 4 (four) times daily. 07/30/2014: Has 12 hour, Max strength (1200mg ) twice daily  . levocetirizine (XYZAL) 5 MG tablet take 1 tablet by mouth every evening   . montelukast (SINGULAIR) 10 MG tablet take 1 tablet by mouth at bedtime   . Multiple Vitamins-Minerals (MULTIVITAMIN PO) Take 1 tablet by mouth daily.   Marland Kitchen olopatadine (PATANOL) 0.1 % ophthalmic  solution Place 1 drop into both eyes 2 (two) times daily.   . Olopatadine HCl (PATANASE) 0.6 % SOLN Place 1-2 each into the nose 2 (two) times daily.   . Omega-3 Fatty Acids (FISH OIL) 1200 MG CAPS Take 2,400 mg by mouth daily.   Marland Kitchen zolpidem (AMBIEN) 10 MG tablet take 1/2 - 1 tablet by mouth at bedtime if needed for sleep   . [DISCONTINUED] ALPRAZolam (XANAX) 0.5 MG tablet Take 0.5-1 tablets (0.25-0.5 mg total) by mouth 3 (three) times daily as needed for anxiety. 08/28/2013: Uses before dentist, just rarely as needed  . [DISCONTINUED] buPROPion (WELLBUTRIN XL) 300 MG 24 hr tablet take 1 tablet by mouth every morning   . [DISCONTINUED] citalopram (CELEXA) 20 MG tablet take 1 tablet by mouth once daily   . [DISCONTINUED] fluticasone (FLONASE) 50 MCG/ACT nasal spray instill 2 sprays into each nostril once daily   . [DISCONTINUED] levocetirizine (XYZAL) 5 MG tablet take 1 tablet by mouth every evening   . [DISCONTINUED] montelukast (SINGULAIR) 10 MG tablet take 1 tablet by mouth at bedtime   . [DISCONTINUED] olopatadine (PATANOL) 0.1 % ophthalmic solution Place 1 drop into both eyes 2 (two) times daily.   . [DISCONTINUED] zolpidem (AMBIEN) 10 MG tablet take 1 tablet by mouth at bedtime if needed for sleep 07/30/2014: Uses 1/2-1 tablet, about 10/month  . pseudoephedrine (SUDAFED) 30 MG tablet Take 30 mg by mouth every 4 (four) hours as needed for congestion. 07/30/2014: Uses prn  . [DISCONTINUED] amoxicillin (AMOXIL) 875 MG tablet Take 1 tablet (875 mg total) by mouth 2 (two) times daily.   . [DISCONTINUED] HYDROcodone-acetaminophen (HYCET) 7.5-325 mg/15 ml solution Take 5 mLs by mouth every 6 (six) hours as needed (or cough).     Allergies  Allergen Reactions  . Codeine Itching and Nausea And Vomiting   ROS: The patient denies anorexia, fever, vision changes, decreased hearing (resolved), ear pain, sore throat, breast concerns, chest pain, palpitations, dizziness, syncope, dyspnea on exertion,  cough, swelling, nausea, vomiting, diarrhea, constipation, abdominal pain, melena, hematochezia, indigestion/heartburn, hematuria, incontinence, dysuria, vaginal discharge, odor or itch, genital lesions, joint pains, numbness, tingling, weakness, tremor, suspicious skin lesions, abnormal bleeding/bruising, or enlarged lymph nodes.  Periods are light, lasts just 2-3 days. Last cycle she skipped a month (60 days between cycles), bled for 3-4 days, stopped for a day, and then had some ongoing bleeding for another week.  Only one other time has she missed a cycle.  Otherwise, periods are generally regular and light. Mild hot flashes at night. Weight is up 5# from last visit, same weight as at her last physical Headaches behind her eyes and in her face ("sinus headache")--see HPI; pending eval by neuro. Lump on right foot hasn't changed, nontender Some  mild discomfort in her right lower back into her buttock, and sometimes into her abdomen    PHYSICAL EXAM:  BP 110/70 mmHg  Pulse 64  Ht 5\' 4"  (1.626 m)  Wt 164 lb (74.39 kg)  BMI 28.14 kg/m2  LMP 06/25/2014  General Appearance:  Alert, cooperative, no distress, appears stated age.Frequent throat-clearing during visit  Head:  Normocephalic, without obvious abnormality, atraumatic   Eyes:  PERRL, conjunctiva/corneas clear, EOM's intact, fundi  benign   Ears:  Normal TM's and external ear canals   Nose:  Nares normal, mucosa only mildly edematous, no drainage or sinus tenderness   Throat:  Lips, mucosa, and tongue normal; teeth and gums normal   Neck:  Supple, no lymphadenopathy; thyroid: no enlargement/tenderness/nodules; no carotid  bruit or JVD   Back:  Spine nontender, no curvature, ROM normal, no CVA tenderness   Lungs:  Clear to auscultation bilaterally without wheezes, rales or ronchi; respirations unlabored   Chest Wall:  No tenderness or deformity   Heart:  Regular rate and rhythm, S1 and S2 normal, no murmur,  rub  or gallop   Breast Exam:  No tenderness, masses, or nipple discharge or inversion. No axillary lymphadenopathy. Some fibroglandular changes and mild tenderness bilaterally, especially UOQ's. No dominant masses  Abdomen:  Soft, non-tender, nondistended, normoactive bowel sounds,  no masses, no hepatosplenomegaly   Genitalia:  Normal external genitalia without lesions. BUS and vagina normal; no cervical motion tenderness. No abnormal vaginal discharge. Uterus and adnexa not enlarged, nontender, no masses. Pap not performed. Some mild pelvic relaxation noted (especially with cough), but no significant prolapse noted  Rectal:  Normal tone, no masses or tenderness; Small rectocele noted. No significant amount of stool in vault; smear is heme negative  Extremities:  No clubbing, cyanosis or edema   Pulses:  2+ and symmetric all extremities   Skin:  Skin color, texture, turgor normal, no rashes.  Lymph nodes:  Cervical, supraclavicular, and axillary nodes normal   Neurologic:  CNII-XII intact, normal strength, sensation and gait; reflexes 2+ and symmetric throughout    Psych: Normal mood, affect, hygiene and grooming.   ASSESSMENT/PLAN:  Discussed monthly self breast exams and yearly mammograms after the age of 40--she is past due.  Counseled re: risks/benefits, reasons to get for earlier diagnosis; at least 30 minutes of aerobic activity at least 5 days/week, weight-bearing exercise at least 2x/wk; proper sunscreen use reviewed; healthy diet, including goals of calcium and vitamin D intake and alcohol recommendations (less than or equal to 1 drink/day) reviewed; regular seatbelt use; changing batteries in smoke detectors.  Immunization recommendations discussed.  Colonoscopy recommendations reviewed--next year (age 65)  Annual physical exam - Plan: TSH, Lipid panel, Glucose, random, POCT Urinalysis Dipstick  Allergic rhinitis, unspecified allergic  rhinitis type - Plan: fluticasone (FLONASE) 50 MCG/ACT nasal spray, levocetirizine (XYZAL) 5 MG tablet, montelukast (SINGULAIR) 10 MG tablet, olopatadine (PATANOL) 0.1 % ophthalmic solution  Insomnia - controlled. continue intermittent use of ambien prn - Plan: zolpidem (AMBIEN) 10 MG tablet  Anxiety - Plan: ALPRAZolam (XANAX) 0.5 MG tablet  Anxiety - controlled. Continue current meds. - Plan: ALPRAZolam (XANAX) 0.5 MG tablet  Depression, major, in remission - Plan: buPROPion (WELLBUTRIN XL) 300 MG 24 hr tablet, citalopram (CELEXA) 20 MG tablet  Urinary complaints--Ddx reviewed. No evidence of infection.  Possibly related to her cycles (per pt history--to try and confirm pattern)--? Connection (?adenomyosis, endometriosis, etc).  Continue to drink plenty of fluids, limit caffeine.  Return for recheck is worsening  symptoms. No significant prolapse noted, but some mild weakening of pelvic floor was noted.  Allergies--overall, well controlled on her current regimen. Continue allergy shots.  Risks and side effects of meds were reviewed; meds refilled.

## 2014-07-30 NOTE — Patient Instructions (Signed)
  HEALTH MAINTENANCE RECOMMENDATIONS:  It is recommended that you get at least 30 minutes of aerobic exercise at least 5 days/week (for weight loss, you may need as much as 60-90 minutes). This can be any activity that gets your heart rate up. This can be divided in 10-15 minute intervals if needed, but try and build up your endurance at least once a week.  Weight bearing exercise is also recommended twice weekly.  Eat a healthy diet with lots of vegetables, fruits and fiber.  "Colorful" foods have a lot of vitamins (ie green vegetables, tomatoes, red peppers, etc).  Limit sweet tea, regular sodas and alcoholic beverages, all of which has a lot of calories and sugar.  Up to 1 alcoholic drink daily may be beneficial for women (unless trying to lose weight, watch sugars).  Drink a lot of water.  Calcium recommendations are 1200-1500 mg daily (1500 mg for postmenopausal women or women without ovaries), and vitamin D 1000 IU daily.  This should be obtained from diet and/or supplements (vitamins), and calcium should not be taken all at once, but in divided doses.  Monthly self breast exams and yearly mammograms for women over the age of 44 is recommended.  Sunscreen of at least SPF 30 should be used on all sun-exposed parts of the skin when outside between the hours of 10 am and 4 pm (not just when at beach or pool, but even with exercise, golf, tennis, and yard work!)  Use a sunscreen that says "broad spectrum" so it covers both UVA and UVB rays, and make sure to reapply every 1-2 hours.  Remember to change the batteries in your smoke detectors when changing your clock times in the spring and fall.  Use your seat belt every time you are in a car, and please drive safely and not be distracted with cell phones and texting while driving.  Please schedule your mammogram. Call [facility] (on [facility]) and schedule routine screening mammogram.  Please schedule routine eye exam (we discussed  [facility], Dr. Herbert Deaner, Dr. Delman Cheadle, Dr. Katy Fitch)

## 2015-02-06 ENCOUNTER — Telehealth: Payer: Self-pay | Admitting: Family Medicine

## 2015-02-07 NOTE — Telephone Encounter (Signed)
P.A. Zolpidem approved til 02/06/16, pt informed, faxed pharmacy

## 2015-02-22 ENCOUNTER — Other Ambulatory Visit: Payer: Self-pay | Admitting: Family Medicine

## 2015-02-25 NOTE — Telephone Encounter (Signed)
Is this okay to refill? 

## 2015-02-25 NOTE — Telephone Encounter (Signed)
Ok for #30 with 2 refills 

## 2015-04-29 ENCOUNTER — Encounter: Payer: Self-pay | Admitting: Family Medicine

## 2015-04-29 ENCOUNTER — Ambulatory Visit (INDEPENDENT_AMBULATORY_CARE_PROVIDER_SITE_OTHER): Payer: BC Managed Care – PPO | Admitting: Family Medicine

## 2015-04-29 VITALS — BP 130/80 | HR 72 | Ht 64.0 in | Wt 180.8 lb

## 2015-04-29 DIAGNOSIS — Z5181 Encounter for therapeutic drug level monitoring: Secondary | ICD-10-CM

## 2015-04-29 DIAGNOSIS — R609 Edema, unspecified: Secondary | ICD-10-CM | POA: Diagnosis not present

## 2015-04-29 DIAGNOSIS — R0600 Dyspnea, unspecified: Secondary | ICD-10-CM | POA: Diagnosis not present

## 2015-04-29 DIAGNOSIS — N926 Irregular menstruation, unspecified: Secondary | ICD-10-CM | POA: Diagnosis not present

## 2015-04-29 DIAGNOSIS — R5383 Other fatigue: Secondary | ICD-10-CM

## 2015-04-29 DIAGNOSIS — G43009 Migraine without aura, not intractable, without status migrainosus: Secondary | ICD-10-CM | POA: Diagnosis not present

## 2015-04-29 DIAGNOSIS — R202 Paresthesia of skin: Secondary | ICD-10-CM | POA: Diagnosis not present

## 2015-04-29 DIAGNOSIS — R600 Localized edema: Secondary | ICD-10-CM

## 2015-04-29 DIAGNOSIS — G43909 Migraine, unspecified, not intractable, without status migrainosus: Secondary | ICD-10-CM | POA: Insufficient documentation

## 2015-04-29 DIAGNOSIS — R635 Abnormal weight gain: Secondary | ICD-10-CM | POA: Diagnosis not present

## 2015-04-29 LAB — COMPREHENSIVE METABOLIC PANEL
ALT: 49 U/L — AB (ref 6–29)
AST: 30 U/L (ref 10–35)
Albumin: 4.3 g/dL (ref 3.6–5.1)
Alkaline Phosphatase: 62 U/L (ref 33–115)
BUN: 10 mg/dL (ref 7–25)
CHLORIDE: 103 mmol/L (ref 98–110)
CO2: 27 mmol/L (ref 20–31)
Calcium: 9.7 mg/dL (ref 8.6–10.2)
Creat: 1 mg/dL (ref 0.50–1.10)
GLUCOSE: 98 mg/dL (ref 65–99)
Potassium: 4.4 mmol/L (ref 3.5–5.3)
Sodium: 142 mmol/L (ref 135–146)
Total Bilirubin: 0.7 mg/dL (ref 0.2–1.2)
Total Protein: 7.3 g/dL (ref 6.1–8.1)

## 2015-04-29 LAB — CBC WITH DIFFERENTIAL/PLATELET
Basophils Absolute: 0 10*3/uL (ref 0.0–0.1)
Basophils Relative: 0 % (ref 0–1)
Eosinophils Absolute: 0.1 10*3/uL (ref 0.0–0.7)
Eosinophils Relative: 1 % (ref 0–5)
HEMATOCRIT: 38.6 % (ref 36.0–46.0)
HEMOGLOBIN: 12.8 g/dL (ref 12.0–15.0)
LYMPHS ABS: 1.8 10*3/uL (ref 0.7–4.0)
LYMPHS PCT: 20 % (ref 12–46)
MCH: 29.8 pg (ref 26.0–34.0)
MCHC: 33.2 g/dL (ref 30.0–36.0)
MCV: 90 fL (ref 78.0–100.0)
MONO ABS: 0.9 10*3/uL (ref 0.1–1.0)
MONOS PCT: 10 % (ref 3–12)
MPV: 11.5 fL (ref 8.6–12.4)
NEUTROS ABS: 6.2 10*3/uL (ref 1.7–7.7)
Neutrophils Relative %: 69 % (ref 43–77)
Platelets: 226 10*3/uL (ref 150–400)
RBC: 4.29 MIL/uL (ref 3.87–5.11)
RDW: 13.3 % (ref 11.5–15.5)
WBC: 9 10*3/uL (ref 4.0–10.5)

## 2015-04-29 LAB — POCT URINALYSIS DIPSTICK
Bilirubin, UA: NEGATIVE
GLUCOSE UA: NEGATIVE
Ketones, UA: NEGATIVE
Leukocytes, UA: NEGATIVE
NITRITE UA: NEGATIVE
PROTEIN UA: NEGATIVE
RBC UA: NEGATIVE
Spec Grav, UA: 1.015
UROBILINOGEN UA: NEGATIVE
pH, UA: 6.5

## 2015-04-29 LAB — VITAMIN B12: Vitamin B-12: 690 pg/mL (ref 211–911)

## 2015-04-29 LAB — TSH: TSH: 0.903 u[IU]/mL (ref 0.350–4.500)

## 2015-04-29 NOTE — Patient Instructions (Signed)
Go to [facility] (at your convenience) for chest x-ray. Continue to follow low sodium diet. Try and get regular exercise, even if feeling tired, but listen to your body and stop if you are feeling short of breath.  We will refer you to another neurologist for your headaches. We will be in touch with your lab results in the next 1-2 days. Contact us right away if you start having any chest discomfort

## 2015-04-29 NOTE — Progress Notes (Signed)
Chief Complaint  Patient presents with  . Edema    B/L hands and feet have been swelling several weeks. Keeps gaining weight and can not keep it off. Has been having SOB since last week and has been snoring. Also complains of bloating. Dr Domingo Cocking put her on gabapentin and she took for ome month and stopped this past weekend. ot really happy with care at his office, would it be possible to see neurology.   . Menopause    can you do tests to see if she in menopause.    15# weight gain in the last 9 months.  She sometimes feels unmotivated, decreased energy level compared to normal.  Over the last 2 weeks she has developed more shortness of breath with walking, and has noticed worsening swelling in her hands and feet.  She doesn't eat a lot of sodium in her diet.  Hand and feet swelling has been over months, but more extreme in the last week or two.  Snoring has been worse in the last few days, which is along with her worsening shortness of breath.  This is noticeable by her partner, who comes with her today to help provide the history.  She is frustrated that she can't maintain her weight, and that she has no energy.  Denies that her depression is flaring, moods overall have been very good.  Last period was in April.  She has some hot flashes, night sweats (intermittent). She is wondering if she is menopausal, and if that is contributing.  Headaches:  Prefers to go to another neurologist for her headaches; not sure that Dr. Domingo Cocking really hears what she is saying (just following a protocol, having her stick with medications for a long time that were giving side effects, not helping).  Would like another opinion.  Last severe headache, with slurred speech, was about 6 months ago.  ENT said her sinuses are fine, and was the one who originally sent her to Dr. Domingo Cocking. She doesn't have auras with the headaches, but she does get "sick headaches" (with nausea).  She just recently stopped neurontin (taking  6109m qHS and 3078min the morning.  There were some other medications tried in the interim.  She hasn't been topamax.  She has had some tingling in her fingers, along with flare of neck pain over the last week.  PMH, PSH, SH reviewed.  Outpatient Encounter Prescriptions as of 04/29/2015  Medication Sig Note  . baclofen (LIORESAL) 10 MG tablet take 1 tablet by mouth UP TO twice a day if needed for headache  ...  (REFER TO PRESCRIPTION NOTES). 04/29/2015: Received from: External Pharmacy  . buPROPion (WELLBUTRIN XL) 300 MG 24 hr tablet take 1 tablet by mouth every morning   . Cholecalciferol (VITAMIN D) 2000 UNITS tablet Take 2,000 Units by mouth daily.   . citalopram (CELEXA) 20 MG tablet take 1 tablet by mouth once daily   . fluticasone (FLONASE) 50 MCG/ACT nasal spray instill 2 sprays into each nostril once daily   . GuaiFENesin (MUCINEX PO) Take 800 mg by mouth 4 (four) times daily. 07/30/2014: Has 12 hour, Max strength (120037mtwice daily  . levocetirizine (XYZAL) 5 MG tablet take 1 tablet by mouth every evening   . Multiple Vitamins-Minerals (MULTIVITAMIN PO) Take 1 tablet by mouth daily.   . Omega-3 Fatty Acids (FISH OIL) 1200 MG CAPS Take 2,400 mg by mouth daily.   . zMarland Kitchenlpidem (AMBIEN) 10 MG tablet take 1/2 to 1 tablet by mouth  at bedtime if needed for sleep   . ALPRAZolam (XANAX) 0.5 MG tablet Take 0.5-1 tablets (0.25-0.5 mg total) by mouth 3 (three) times daily as needed for anxiety. (Patient not taking: Reported on 04/29/2015)   . gabapentin (NEURONTIN) 300 MG capsule Take 900 mg by mouth daily. 04/29/2015: 2 at bedtime, and 1 in the morning for the last month; hasn't taken it in 2 days  . olopatadine (PATANOL) 0.1 % ophthalmic solution Place 1 drop into both eyes 2 (two) times daily. (Patient not taking: Reported on 04/29/2015)   . Olopatadine HCl (PATANASE) 0.6 % SOLN Place 1-2 each into the nose 2 (two) times daily.   . pseudoephedrine (SUDAFED) 30 MG tablet Take 30 mg by mouth every 4  (four) hours as needed for congestion. 04/29/2015: Uses prn; last dose was yesterday afternoon  . [DISCONTINUED] montelukast (SINGULAIR) 10 MG tablet take 1 tablet by mouth at bedtime    No facility-administered encounter medications on file as of 04/29/2015.     BP 142/90 mmHg  Pulse 72  Ht 5' 4"  (1.626 m)  Wt 180 lb 12.8 oz (82.01 kg)  BMI 31.02 kg/m2  LMP 12/14/2014  130/80 on repeat by MD, RA Well developed, pleasant, overweight female, accompanied by her female partner.  HEENT: PERRL, EOMI, conjunctiva clear, sclera anicteric. TM's and EAC's normal. OP is clear. Neck: no lymphadenopathy, thyromegaly, elevated JVP Heart: regular rate and rhythm without  Murmur Lungs: clear bilaterally Abdomen: soft, nontender, no organomegaly or mass Extremities: 2+ pulses. No pitting edema in lower extremities.  Hands mildly swollen (unable to wear rings) Skin: no bruising, rashes Neuro: alert and oriented. Normal strength, gait, sensation, cranial nerves Psych: normal mood, affect, hygiene and grooming  ASSESSMENT/PLAN:  Other fatigue - Plan: Comprehensive metabolic panel, CBC with Differential/Platelet, TSH, Vit D  25 hydroxy (rtn osteoporosis monitoring), Vitamin B12, POCT Urinalysis Dipstick  Weight gain - some of which is related to intermittent swelling. check labs. push through the fatigue for exercise (don't push through dyspnea). May need to consider sleep st  Edema extremities - urine dip neg--not nephrotic syndrome.  reviewed low sodium diet. May need lasix prn if all work-up is negative.  r/o CHF - Plan: Comprehensive metabolic panel, TSH, Brain natriuretic peptide, POCT Urinalysis Dipstick  Dyspnea - with exertion, ?if related to weight gain. normal exam, but check CXR to eval for underlying problem or CHF - Plan: CBC with Differential/Platelet, TSH, Brain natriuretic peptide, DG Chest 2 View, POCT Urinalysis Dipstick  Irregular menses - check FSH. If normal, give provera trial -  Plan: TSH, Follicle Stimulating Hormone, medroxyPROGESTERone (PROVERA) 10 MG tablet  Medication monitoring encounter - Plan: Comprehensive metabolic panel, CBC with Differential/Platelet, Vit D  25 hydroxy (rtn osteoporosis monitoring)  Paresthesias - check B12 (intermittent/mild) - Plan: Vitamin B12  Migraine without aura and without status migrainosus, not intractable - refer to [facility]. consider topamax (also given her weight gain) - Plan: Ambulatory referral to Neurology   Fatigue--check labs.  Can be multifactorial, including due to Gabapentin (doubt xyzal, as she has been on that for a while). Sounds like fatigue may have been present prior to starting the medication, and hasn't improved since she stopped it two days ago.  May need sleep study if eval all normal (likely a consequence of the weight gain, rather than a cause though).  Check FSH.  If normal or low, needs trial of provera challenge.   FSH, TSH, CBC, BNP, c-met, Vit D, b12 CXR

## 2015-04-30 ENCOUNTER — Ambulatory Visit
Admission: RE | Admit: 2015-04-30 | Discharge: 2015-04-30 | Disposition: A | Discharge status: Home / Self Care | Payer: BC Managed Care – PPO | Source: Ambulatory Visit | Attending: Family Medicine | Admitting: Family Medicine

## 2015-04-30 DIAGNOSIS — R0600 Dyspnea, unspecified: Secondary | ICD-10-CM

## 2015-04-30 LAB — BRAIN NATRIURETIC PEPTIDE: Brain Natriuretic Peptide: 79.6 pg/mL (ref 0.0–100.0)

## 2015-04-30 LAB — FOLLICLE STIMULATING HORMONE: FSH: 21.8 m[IU]/mL

## 2015-04-30 LAB — VITAMIN D 25 HYDROXY (VIT D DEFICIENCY, FRACTURES): Vit D, 25-Hydroxy: 45 ng/mL (ref 30–100)

## 2015-04-30 MED ORDER — MEDROXYPROGESTERONE ACETATE 10 MG PO TABS
10.0000 mg | ORAL_TABLET | Freq: Every day | ORAL | Status: DC
Start: 2015-04-30 — End: 2015-08-01

## 2015-05-10 ENCOUNTER — Encounter: Payer: Self-pay | Admitting: Gastroenterology

## 2015-05-13 ENCOUNTER — Encounter: Payer: Self-pay | Admitting: Neurology

## 2015-05-13 ENCOUNTER — Ambulatory Visit (INDEPENDENT_AMBULATORY_CARE_PROVIDER_SITE_OTHER): Payer: BC Managed Care – PPO | Admitting: Neurology

## 2015-05-13 VITALS — BP 127/84 | HR 69 | Ht 64.0 in | Wt 176.5 lb

## 2015-05-13 DIAGNOSIS — R202 Paresthesia of skin: Secondary | ICD-10-CM

## 2015-05-13 DIAGNOSIS — M542 Cervicalgia: Secondary | ICD-10-CM

## 2015-05-13 DIAGNOSIS — H538 Other visual disturbances: Secondary | ICD-10-CM

## 2015-05-13 DIAGNOSIS — G4489 Other headache syndrome: Secondary | ICD-10-CM

## 2015-05-13 DIAGNOSIS — R51 Headache: Secondary | ICD-10-CM

## 2015-05-13 DIAGNOSIS — H905 Unspecified sensorineural hearing loss: Secondary | ICD-10-CM | POA: Diagnosis not present

## 2015-05-13 DIAGNOSIS — H9190 Unspecified hearing loss, unspecified ear: Secondary | ICD-10-CM

## 2015-05-13 DIAGNOSIS — R519 Headache, unspecified: Secondary | ICD-10-CM | POA: Insufficient documentation

## 2015-05-13 MED ORDER — NORTRIPTYLINE HCL 10 MG PO CAPS: 20.0000 mg | ORAL_CAPSULE | Freq: Every day | ORAL | Status: DC

## 2015-05-13 NOTE — Patient Instructions (Addendum)
Overall you are doing fairly well but I do want to suggest a few things today:   Remember to drink plenty of fluid, eat healthy meals and do not skip any meals. Try to eat protein with a every meal and eat a healthy snack such as fruit or nuts in between meals. Try to keep a regular sleep-wake schedule and try to exercise daily, particularly in the form of walking, 20-30 minutes a day, if you can.   As far as your medications are concerned, I would like to suggest: Nortriptyline. Start with 10mg  at night an hour before bed. In 3-4 weeks can increase to 20mg  (2 pills)  As far as diagnostic testing: MRI of the brain, EKG,   Our phone number is [phone]. We also have an after hours call service for urgent matters and there is a physician on-call for urgent questions. For any emergencies you know to call 911 or go to the nearest emergency room

## 2015-05-13 NOTE — Progress Notes (Signed)
[facility]    Provider:  Dr Jaynee Eagles Referring Provider: Rita Ohara, MD Primary Care Physician:  Vikki Ports, MD  CC:  Migraines  HPI:  Brandi Carpenter is a 49 y.o. female here as a referral from Dr. Tomi Bamberger for Migraines. She thought it was sinus headaches. She has been seeing an allergist and she saw an ENT and her sinuses were fine. Migraines for years since her 75s. No FHx of migraines. She feels nauseated, She always wears sunglasses, noise bothers her. It is behind the eyes. Throbbing. Pounding. She has daily headaches that are pressure type headaches. She has to sit still and is sick to her stomach, needs a quiet room. The noise is a significant trigger. At least twice a week. No aura. Her neck is very tight. Tingling in the right arm. Started with zonegran. It made her agitated. It never worksed. Tried gabapentin. She had trigger point injections and it didn't help. She feels like she has fluid in her ears and she gets off balance sometimes. Antibiotics have never done anything to hellp. Steroids helps and she feels wonderful for a while. Her sleeping varies, she takes [place]. She wakes frequently. She started snoring but not normally. She has pressure in her ear today. Massages and chiropractors have helped in the past. Also endorses blurry vision. No other focal neurologic deficits.   Reviewed notes, labs and imaging from outside physicians, which showed:  Personally reviewed MRI of the cervical spine from April 2009: Findings: Normal cervical alignment. There is straightening of the cervical lordosis. There is no fracture or mass lesion. The cord appears normal.  C2-3: Negative C3-4: Mild disc degeneration and early spondylosis and facet arthropathy. C4-5: Mild disc degeneration. There is bilateral uncinate spurring with mild biforaminal narrowing. There is left foraminal encroachment. Central canal is patent. There is a tiny central disc  protrusion. C5-6: Disc degeneration with spondylosis. There is a very small central disc protrusion and there is diffuse uncinate hypertrophy. There is left foraminal encroachment and left facet arthropathy. There is no cord deformity. C6-7: Mild disc degeneration and very mild uncinate spurring  C7-T1: Negative  CMP unremarkable, CBC unremarkable, TSH within normal limits, B12 690,  IMPRESSION: Multilevel cervical degenerative changes as above. There is no cord deformity or significant central canal stenosis. There is left foraminal encroachment at C4-5 and C5-6 due to uncinate spurring and facet overgrowth.  Review of Systems: Patient complains of symptoms per HPI as well as the following symptoms: Weight gain, blurred vision, shortness of breath, joint pain, spinning sensation, allergies, headache, slurred speech, dizziness, depression, restless legs Pertinent negatives per HPI. All others negative.   Social History   Social History  . Marital Status: Single    Spouse Name: N/A  . Number of Children: 0  . Years of Education: 16   Occupational History  . Facilities manager   Social History Main Topics  . Smoking status: Never Smoker   . Smokeless tobacco: Never Used  . Alcohol Use: No  . Drug Use: No  . Sexual Activity:    Partners: Female   Other Topics Concern  . Not on file   Social History Narrative   Lives with female partner, 2 beagles   Caffeine use: drinks soda 3 drinks per day    Family History  Problem Relation Age of Onset  . Hypertension Mother   . Diabetes Mother   . Heart disease Father   . Depression Sister   . Colon cancer  Maternal Grandmother      died at 20  . Diabetes Maternal Grandmother   . Breast cancer Cousin   . Alzheimer's disease Paternal Grandmother   . Colon polyps Maternal Uncle     Past Medical History  Diagnosis Date  . Depression   . Anxiety   . Insomnia   . Vitamin D deficiency   . Allergic rhinitis, cause  unspecified   . Abnormal Pap smear     ASCUS 08/1999; CIN 11/1999, s/p conization    Past Surgical History  Procedure Laterality Date  . Ankle surgery  child    left for torn ligaments, left  . Pilonidal cyst excision    . Cervical conization w/bx  2001    Current Outpatient Prescriptions  Medication Sig Dispense Refill  . ALPRAZolam (XANAX) 0.5 MG tablet Take 0.5-1 tablets (0.25-0.5 mg total) by mouth 3 (three) times daily as needed for anxiety. 20 tablet 0  . baclofen (LIORESAL) 10 MG tablet take 1 tablet by mouth UP TO twice a day if needed for headache  ...  (REFER TO PRESCRIPTION NOTES).  0  . buPROPion (WELLBUTRIN XL) 300 MG 24 hr tablet take 1 tablet by mouth every morning 90 tablet 3  . Cholecalciferol (VITAMIN D) 2000 UNITS tablet Take 2,000 Units by mouth daily.    . citalopram (CELEXA) 20 MG tablet take 1 tablet by mouth once daily 90 tablet 3  . EPIPEN 2-PAK 0.3 MG/0.3ML SOAJ injection use as directed if needed PER INJECTION  0  . fluticasone (FLONASE) 50 MCG/ACT nasal spray instill 2 sprays into each nostril once daily 16 g 11  . levocetirizine (XYZAL) 5 MG tablet take 1 tablet by mouth every evening 30 tablet 11  . Multiple Vitamins-Minerals (MULTIVITAMIN PO) Take 1 tablet by mouth daily.    Marland Kitchen olopatadine (PATANOL) 0.1 % ophthalmic solution Place 1 drop into both eyes 2 (two) times daily. 5 mL 11  . Olopatadine HCl (PATANASE) 0.6 % SOLN Place 1-2 each into the nose 2 (two) times daily.    . Omega-3 Fatty Acids (FISH OIL) 1200 MG CAPS Take 2,400 mg by mouth daily.    Marland Kitchen zolpidem (AMBIEN) 10 MG tablet take 1/2 to 1 tablet by mouth at bedtime if needed for sleep 30 tablet 2  . gabapentin (NEURONTIN) 300 MG capsule Take 900 mg by mouth daily.  0  . GuaiFENesin (MUCINEX PO) Take 800 mg by mouth 4 (four) times daily.    . medroxyPROGESTERone (PROVERA) 10 MG tablet Take 1 tablet (10 mg total) by mouth daily. (Patient not taking: Reported on 05/13/2015) 10 tablet 0  . nortriptyline  (PAMELOR) 10 MG capsule Take 2 capsules (20 mg total) by mouth at bedtime. 60 capsule 11  . pseudoephedrine (SUDAFED) 30 MG tablet Take 30 mg by mouth every 4 (four) hours as needed for congestion.     No current facility-administered medications for this visit.    Allergies as of 05/13/2015 - Review Complete 05/13/2015  Allergen Reaction Noted  . Codeine Itching and Nausea And Vomiting 05/09/2012    Vitals: BP 127/84 mmHg  Pulse 69  Ht 5\' 4"  (1.626 m)  Wt 176 lb 8 oz (80.06 kg)  BMI 30.28 kg/m2  LMP 12/14/2014 Last Weight:  Wt Readings from Last 1 Encounters:  05/13/15 176 lb 8 oz (80.06 kg)   Last Height:   Ht Readings from Last 1 Encounters:  05/13/15 5\' 4"  (1.626 m)   Physical exam: Exam: Gen: NAD, conversant, well  nourised, obese, well groomed                     CV: RRR, no MRG. No Carotid Bruits. No peripheral edema, warm, nontender Eyes: Conjunctivae clear without exudates or hemorrhage  Neuro: Detailed Neurologic Exam  Speech:    Speech is normal; fluent and spontaneous with normal comprehension.  Cognition:    The patient is oriented to person, place, and time;     recent and remote memory intact;     language fluent;     normal attention, concentration,     fund of knowledge Cranial Nerves:    The pupils are equal, round, and reactive to light. The fundi are normal and spontaneous venous pulsations are present. Visual fields are full to finger confrontation. Extraocular movements are intact. Trigeminal sensation is intact and the muscles of mastication are normal. The face is symmetric. The palate elevates in the midline. Hearing intact. Voice is normal. Shoulder shrug is normal. The tongue has normal motion without fasciculations.   Coordination:    Normal finger to nose and heel to shin. Normal rapid alternating movements.   Gait:    Heel-toe and tandem gait are normal.   Motor Observation:    No asymmetry, no atrophy, and no involuntary movements  noted. Tone:    Normal muscle tone.    Posture:    Posture is normal. normal erect    Strength:    Strength is V/V in the upper and lower limbs.      Sensation: intact to LT     Reflex Exam:  DTR's:    Deep tendon reflexes in the upper and lower extremities are normal bilaterally.   Toes:    The toes are downgoing bilaterally.   Clonus:    Clonus is absent.       Assessment/Plan:  49 year old female with chronic migraines, without aura, not intractable, without status migrainosus. We'll start nortriptyline daily at bedtime. We'll order MRI of the brain due to worsening headaches and blurry vision. We'll send to physical therapy for musculoskeletal neck pain which is very common in patients with chronic migraine syndrome.  Performed EKG in the clinic, normal sinus rhythm with QTc of 426 ms.  Sarina Ill, MD  [facility] [address] [place],  [address]  Phone [phone] Fax [phone]

## 2015-05-17 ENCOUNTER — Encounter: Payer: Self-pay | Admitting: Neurology

## 2015-05-22 ENCOUNTER — Telehealth: Payer: Self-pay | Admitting: Neurology

## 2015-05-22 ENCOUNTER — Other Ambulatory Visit: Payer: Self-pay | Admitting: Neurology

## 2015-05-22 DIAGNOSIS — F419 Anxiety disorder, unspecified: Secondary | ICD-10-CM

## 2015-05-22 MED ORDER — ALPRAZOLAM 0.5 MG PO TABS: 0.2500 mg | ORAL_TABLET | Freq: Three times a day (TID) | ORAL | Status: DC | PRN

## 2015-05-22 NOTE — Telephone Encounter (Signed)
Spoke to patient, calle din Xanax. thanks

## 2015-05-22 NOTE — Telephone Encounter (Signed)
Patient called requesting something for claustrophobia before MRI appt. tomorrow. Please call and advise. She can be reached at [phone].

## 2015-05-23 ENCOUNTER — Ambulatory Visit (INDEPENDENT_AMBULATORY_CARE_PROVIDER_SITE_OTHER): Payer: Self-pay

## 2015-05-23 DIAGNOSIS — G4489 Other headache syndrome: Secondary | ICD-10-CM | POA: Diagnosis not present

## 2015-05-23 DIAGNOSIS — R202 Paresthesia of skin: Secondary | ICD-10-CM | POA: Diagnosis not present

## 2015-05-23 DIAGNOSIS — H538 Other visual disturbances: Secondary | ICD-10-CM

## 2015-05-23 DIAGNOSIS — H9190 Unspecified hearing loss, unspecified ear: Secondary | ICD-10-CM

## 2015-05-23 DIAGNOSIS — Z0289 Encounter for other administrative examinations: Secondary | ICD-10-CM

## 2015-05-27 ENCOUNTER — Telehealth: Payer: Self-pay | Admitting: *Deleted

## 2015-05-27 ENCOUNTER — Encounter: Payer: Self-pay | Admitting: *Deleted

## 2015-05-27 NOTE — Telephone Encounter (Signed)
Spoke w/ pt about normal MRI brain. Pt verbalized understanding. No further questions at this time.

## 2015-05-27 NOTE — Progress Notes (Signed)
Rx for xanax 0.5 mg faxed to pt pharmacy on 05/22/15 by Dr. Jaynee Eagles. Received fax confirmation.

## 2015-06-28 ENCOUNTER — Ambulatory Visit: Payer: BC Managed Care – PPO | Attending: Neurology | Admitting: Rehabilitative and Restorative Service Providers"

## 2015-06-28 DIAGNOSIS — M542 Cervicalgia: Secondary | ICD-10-CM | POA: Diagnosis present

## 2015-06-28 NOTE — Addendum Note (Signed)
Addended by: Rudell Cobb M on: 06/28/2015 04:43 PM   Modules accepted: Orders

## 2015-06-28 NOTE — Therapy (Signed)
[facility] [address] [place], [place], [address] Phone: [phone]   Fax:  [phone]  Physical Therapy Evaluation  Patient Details  Name: Brandi Carpenter MRN: [id number] Date of Birth: November 28, 1965 Referring Provider: Dr. Jaynee Eagles  Encounter Date: 06/28/2015      PT End of Session - 06/28/15 1621    Visit Number 1   Number of Visits 8   Date for PT Re-Evaluation 07/28/15   Authorization Type BCBS private insurance   PT Start Time [phone]   PT Stop Time [phone]   PT Time Calculation (min) 45 min   Activity Tolerance No increased pain;Patient tolerated treatment well   Behavior During Therapy [facility] for tasks assessed/performed      Past Medical History  Diagnosis Date  . Depression   . Anxiety   . Insomnia   . Vitamin D deficiency   . Allergic rhinitis, cause unspecified   . Abnormal Pap smear     ASCUS 08/1999; CIN 11/1999, s/p conization    Past Surgical History  Procedure Laterality Date  . Ankle surgery  child    left for torn ligaments, left  . Pilonidal cyst excision    . Cervical conization w/bx  2001    There were no vitals filed for this visit.  Visit Diagnosis:  Musculoskeletal neck pain      Subjective Assessment - 06/28/15 0807    Subjective The patient reports that she has had headaches that she attributed to sinus pressure/heaadaches/allergies x 20+ years.  She was recently diagnosed with migraines and began a medication--she does not yet note a change.     Pertinent History possible h/o pinched nerve in neck years ago, with relief of symptoms.  Patient sees chiropractor for some pain mgmt.  Patient reports getting bored with exercises.   Patient Stated Goals reduce neck pain.   Currently in Pain? Yes   Pain Score 7    Pain Location Neck   Pain Descriptors / Indicators Aching;Tightness   Pain Frequency Constant   Aggravating Factors  uncertain   Pain Relieving Factors uncertain   Effect  of Pain on Daily Activities some days cannot turn my neck as far            [facility] PT Assessment - 06/28/15 0813    Assessment   Medical Diagnosis migraines, neck pain   Referring Provider Dr. Jaynee Eagles   Prior Therapy chriopractor, h/o PT years ago   [place] residence   Prior Function   Level of Independence Independent   Vocation Full time Lobbyist   Observation/Other Assessments   Focus on Therapeutic Outcomes (FOTO)  59%   ROM / Strength   AROM / PROM / Strength AROM;Strength   AROM   Overall AROM  Deficits   AROM Assessment Site Cervical;Shoulder   Right/Left Shoulder Right;Left  WFLs   Cervical - Right Side Bend 35   Cervical - Left Side Bend 32   Cervical - Right Rotation 56   Cervical - Left Rotation 49   Strength   Overall Strength Comments 5/5 throughout bilateral UEs for shoulder flexion, abduction, elbow flexion, elbow extension   Palpation   Palpation comment tightness noted over L scalene muscles at proximal origin distally, bilateral upper traps   Special Tests    Special Tests --  no change in pain with manual distraction              [facility]  Adult PT Treatment/Exercise - 06/28/15 1634    Exercises   Exercises Neck   Neck Exercises: Standing   Other Standing Exercises neural gliding in doorframe   Neck Exercises: Seated   Lateral Flexion 5 reps;Both   Neck Exercises: Supine   Other Supine Exercise towel roll strtch for anterior chest wall opening           PT Education - 06/28/15 1620    Education provided Yes   Education Details HEP: lateral neck flexion, neural tension stretch in doorframe, towel roll stretch supine   Person(s) Educated Patient   Methods Explanation;Demonstration;Handout   Comprehension Verbalized understanding;Returned demonstration             PT Long Term Goals - 06/28/15 1628    PT LONG TERM GOAL #1   Title The patient will return demo HEP for neck  A/ROM, flexibility, postural stabilization.   Baseline Target date 07/28/2015   Time 4   Period Weeks   PT LONG TERM GOAL #2   Title The patient will improve bilateral cervical rotation to > or equal to 65 degrees (R 56, L 49 at eval).   Baseline Target date 07/28/2015   Time 4   Period Weeks   PT LONG TERM GOAL #3   Title The patient will report pain < or equal to 4/10 in neck.   Baseline Target date 07/28/2015   Time 4   Period Weeks   PT LONG TERM GOAL #4   Title The patient will verbalize understanding of work station set-up for optimal Engineer, maintenance (IT) during work tasks.   Baseline Target date 07/28/2015   Time 4   Period Weeks               Plan - 06/28/15 1636    Clinical Impression Statement The patient is a 49 yo female with chronic h/o neck pain and headaches.  She feels symptoms overall have worsened over the last few weeks.  PT to focus on flexibility, postural stabilization, and patient education for optimal body mechanics to reduce pain.   Pt will benefit from skilled therapeutic intervention in order to improve on the following deficits Pain;Decreased mobility;Decreased range of motion;Increased muscle spasms;Impaired flexibility   Rehab Potential Good   Clinical Impairments Affecting Rehab Potential Patient reports getting bored easily with exercise.   PT Frequency 2x / week   PT Duration 4 weeks   PT Treatment/Interventions Moist Heat;Therapeutic exercise;Manual techniques;Therapeutic activities;ADLs/Self Care Home Management;Neuromuscular re-education;Traction;Electrical Stimulation;Cryotherapy;Patient/family education;Passive range of motion   PT Next Visit Plan Check HEP, discuss integrating HEP into daily tasks for improved compliance.   Consulted and Agree with Plan of Care Patient         Problem List Patient Active Problem List   Diagnosis Date Noted  . Headache 05/13/2015  . Blurry vision 05/13/2015  . Perceived hearing changes 05/13/2015  .  Migraine headache 04/29/2015  . Raynaud phenomenon 08/28/2013  . 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 Allergic conjunctivitis 05/09/2013  . Anxiety 03/09/2012  . Insomnia 03/09/2012  . Allergic rhinitis 03/09/2012    Kendarius Vigen, PT 06/28/2015, 4:39 PM  [facility] [address] [place], [place], [address] Phone: [phone]   Fax:  [phone]  Name: MYRIAH BOGGUS MRN: [id number] Date of Birth: [birth year]

## 2015-06-28 NOTE — Patient Instructions (Signed)
Thoracic Self-Mobilization (Supine)    With rolled towel placed lengthwise at lower ribs level, lie back on towel with arms outstretched. Hold _2 minutes. Relax. *Good one to do at the end of the day or before bed.  http://orth.exer.us/1001   Copyright  VHI. All rights reserved.   Stand in a doorframe. Place R hand on the doorframe with fingers pointing down. Straighten elbow and turn your head to look down towards your left shoulder. Hold for 30 seconds. Relax, repeat 3 times.  Repeat on both sides *Good one to do after working at the computer.  AROM: Lateral Neck Flexion    Slowly tilt head toward one shoulder, then the other. Hold each position ___30_ seconds. Repeat _3___ times per set. Do __1__ sets per session.  *Repeat as needed for pain relief.  http://orth.exer.us/297   Copyright  VHI. All rights reserved.

## 2015-07-01 ENCOUNTER — Encounter: Payer: Self-pay | Admitting: Physical Therapy

## 2015-07-01 ENCOUNTER — Ambulatory Visit: Payer: BC Managed Care – PPO | Admitting: Physical Therapy

## 2015-07-01 DIAGNOSIS — M542 Cervicalgia: Secondary | ICD-10-CM

## 2015-07-01 NOTE — Patient Instructions (Signed)
Scapular Retraction (Standing)    With arms at sides, pinch shoulder blades together. Hold for 5 seconds. Repeat 10_ times per set. Do _1_ sets per session. Do __1-2 sessions per day.  http://orth.exer.us/945   Copyright  VHI. All rights reserved.  Roll    Slowly, gently roll shoulder's backwards, in either sitting or standing. Repeat _10__ times. Do _1-2 times per day.  Copyright  VHI. All rights reserved.  Upper Limb Neural Tension: Radial I    Place right arm across low back and turn head down toward other side. Gently increase stretch by pulling down on head and depressing shoulder girdle, if needed. Alternate sides. Hold for 15 seconds. Repeat __3-5__ times each way. Do _1_ sets per session. Do 1-2___ sessions per day.  http://orth.exer.us/408   Copyright  VHI. All rights reserved.

## 2015-07-01 NOTE — Therapy (Signed)
[facility] [address] [place], [place], [address] Phone: [phone]   Fax:  [phone]  Physical Therapy Treatment  Patient Details  Name: Brandi Carpenter MRN: [id number] Date of Birth: April 25, 1966 Referring Provider: Dr. Jaynee Eagles  Encounter Date: 07/01/2015      PT End of Session - 07/01/15 1020    Visit Number 2   Number of Visits 8   Date for PT Re-Evaluation 07/28/15   Authorization Type BCBS private insurance   PT Start Time 1016   PT Stop Time 1058   PT Time Calculation (min) 42 min   Activity Tolerance No increased pain;Patient tolerated treatment well   Behavior During Therapy [facility] for tasks assessed/performed      Past Medical History  Diagnosis Date  . Depression   . Anxiety   . Insomnia   . Vitamin D deficiency   . Allergic rhinitis, cause unspecified   . Abnormal Pap smear     ASCUS 08/1999; CIN 11/1999, s/p conization    Past Surgical History  Procedure Laterality Date  . Ankle surgery  child    left for torn ligaments, left  . Pilonidal cyst excision    . Cervical conization w/bx  2001    There were no vitals filed for this visit.  Visit Diagnosis:  Musculoskeletal neck pain      Subjective Assessment - 07/01/15 1018    Subjective No new complaints. Stretches are going "okay". Feels tight today.   Currently in Pain? Yes   Pain Score 5    Pain Location Neck   Pain Descriptors / Indicators Tightness   Pain Type Chronic pain   Pain Onset More than a month ago   Pain Frequency Constant   Aggravating Factors  unknown   Pain Relieving Factors stretching with towel roll      Treatment: Manual therapy: to bil cervical paraspinals, upper traps and STM's - soft tissue mobs - gentle distraction - passive stretching    Exercises Supine over foam noodle to spine - hold 30 seconds, 2 reps - alternating UE raises x 10 reps each side - "V" x 10 reps - "w" x 10 reps  Red pball at  wall: emphasis on scapular stability - rolling up/down with flexion stretch at top x 10 reps - circles x 10 each way - push ups x 10 reps with emphasis on scapular stability  Seated edge of mat: - Scapular retraction 5 sec's, x 10 reps - posterior shoulder rolls x 10 reps - neural upper trap stretch 15 sec hold x 3 each side * added these to pt's HEP  UBE level 3.0 x 2 minutes fwd and bwd for strengthening and activity tolerance         PT Long Term Goals - 06/28/15 1628    PT LONG TERM GOAL #1   Title The patient will return demo HEP for neck A/ROM, flexibility, postural stabilization.   Baseline Target date 07/28/2015   Time 4   Period Weeks   PT LONG TERM GOAL #2   Title The patient will improve bilateral cervical rotation to > or equal to 65 degrees (R 56, L 49 at eval).   Baseline Target date 07/28/2015   Time 4   Period Weeks   PT LONG TERM GOAL #3   Title The patient will report pain < or equal to 4/10 in neck.   Baseline Target date 07/28/2015   Time 4   Period Weeks  PT LONG TERM GOAL #4   Title The patient will verbalize understanding of work station set-up for optimal body  mechanics during work tasks.   Baseline Target date 07/28/2015   Time 4   Period Weeks           Plan - 07/01/15 1020    Clinical Impression Statement Added stretches and gentle strengthening exercises to HEP that pt can perform at work, in the car and with daily activities to assist wtih increased compliance with HEP. Pt with tight cervical muscles and upper traps on palpation, decreased with manual therapy. No issues reported with exercies today. Pt making steady progress toward goals.   Pt will benefit from skilled therapeutic intervention in order to improve on the following deficits Pain;Decreased mobility;Decreased range of motion;Increased muscle spasms;Impaired flexibility   Rehab Potential Good   Clinical Impairments Affecting Rehab Potential Patient reports getting bored  easily with exercise.   PT Frequency 2x / week   PT Duration 4 weeks   PT Treatment/Interventions Moist Heat;Therapeutic exercise;Manual techniques;Therapeutic activities;ADLs/Self Care Home Management;Neuromuscular re-education;Traction;Electrical Stimulation;Cryotherapy;Patient/family education;Passive range of motion   PT Next Visit Plan Continue toward goals: decreased tightness, pain and increased ROM/strengthening   Consulted and Agree with Plan of Care Patient        Problem List Patient Active Problem List   Diagnosis Date Noted  . Headache 05/13/2015  . Blurry vision 05/13/2015  . Perceived hearing changes 05/13/2015  . Migraine headache 04/29/2015  . Raynaud phenomenon 08/28/2013  . 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 Allergic conjunctivitis 05/09/2013  . Anxiety 03/09/2012  . Insomnia 03/09/2012  . Allergic rhinitis 03/09/2012    Willow Ora 07/01/2015, 2:21 PM  Willow Ora, PTA, [facility] [address] [place], [place] [address] [phone] 07/01/2015, 2:21 PM   Name: CRAIG WISNEWSKI MRN: [id number] Date of Birth: 12-22-1965

## 2015-07-04 ENCOUNTER — Ambulatory Visit: Payer: BC Managed Care – PPO | Admitting: Rehabilitative and Restorative Service Providers"

## 2015-07-04 DIAGNOSIS — M542 Cervicalgia: Secondary | ICD-10-CM

## 2015-07-04 NOTE — Therapy (Signed)
[facility] [address] [place], [place], [address] Phone: [phone]   Fax:  [phone]  Physical Therapy Treatment  Patient Details  Name: Brandi [place] MRN: [id number] Date of Birth: [birth year] Referring Provider: Dr. Jaynee Eagles  Encounter Date: 07/04/2015      PT End of Session - 07/04/15 1253    Visit Number 3   Number of Visits 8   Date for PT Re-Evaluation 07/28/15   Authorization Type [employer] private insurance   PT Start Time [phone]   PT Stop Time 0930   PT Time Calculation (min) 40 min   Activity Tolerance No increased pain;Patient tolerated treatment well   Behavior During Therapy [facility] for tasks assessed/performed      Past Medical History  Diagnosis Date  . Depression   . Anxiety   . Insomnia   . Vitamin D deficiency   . Allergic rhinitis, cause unspecified   . Abnormal Pap smear     ASCUS 08/1999; CIN 11/1999, s/p conization    Past Surgical History  Procedure Laterality Date  . Ankle surgery  child    left for torn ligaments, left  . Pilonidal cyst excision    . Cervical conization w/bx  2001    There were no vitals filed for this visit.  Visit Diagnosis:  Musculoskeletal neck pain      Subjective Assessment - 07/04/15 1047    Subjective The patient reports stretches are helping and she is completing HEP.     Patient Stated Goals reduce neck pain.   Currently in Pain? Yes   Pain Score --  "mid range"   Pain Location Neck   Pain Descriptors / Indicators Tightness   Pain Type Chronic pain   Pain Onset More than a month ago   Pain Frequency Constant   Aggravating Factors  unknown   Pain Relieving Factors stretching              [facility] Adult PT Treatment/Exercise - 07/04/15 1051    Self-Care   Self-Care Posture   Posture Discussed sitting posture at work with recommendations of :1) Use physioball at desk for postural awareness 2) modifying/using small table to support ipad vs.  looking down in lap 3) discussed progression of exercises on physioball in home environment   Exercises   Exercises Neck;Lumbar   Neck Exercises: Theraband   Scapula Retraction 10 reps   Scapula Retraction Limitations Patient feels more stretch in anterior chest muscles vs. strengthening of scapular muscles   Horizontal ABduction 10 reps;Green   Horizontal ABduction Limitations with green band seated on physioball without difficulty   Neck Exercises: Standing   Other Standing Exercises Standing "w" scap retraction with neck retraction x 10 reps   Neck Exercises: Prone   Other Prone Exercise prone back/upper back extension reaching hands towards feet (see HEP printout)   Lumbar Exercises: Seated   Hip Flexion on Ball Right;Left;10 reps  rolled down on ball with ball under upper back for core   Other Seated Lumbar Exercises Sitting marching and LE extension on physioball with UEs abducted for upper back/core contraction   Lumbar Exercises: Quadruped   Opposite Arm/Leg Raise 10 reps   Opposite Arm/Leg Raise Limitations patient feels in hips and low back vs. upper back                     PT Long Term Goals - 06/28/15 1628    PT LONG TERM GOAL #1  Title The patient will return demo HEP for neck A/ROM, flexibility, postural stabilization.   Baseline Target date 07/28/2015   Time 4   Period Weeks   PT LONG TERM GOAL #2   Title The patient will improve bilateral cervical rotation to > or equal to 65 degrees (R 56, L 49 at eval).   Baseline Target date 07/28/2015   Time 4   Period Weeks   PT LONG TERM GOAL #3   Title The patient will report pain < or equal to 4/10 in neck.   Baseline Target date 07/28/2015   Time 4   Period Weeks   PT LONG TERM GOAL #4   Title The patient will verbalize understanding of work station set-up for optimal Engineer, maintenance (IT) during work tasks.   Baseline Target date 07/28/2015   Time 4   Period Weeks               Plan - 07/04/15  1254    Clinical Impression Statement The patient is progressing with exercises.  She c/o tightness in anterior muscles and with exercise for scapular retraction feels more stretch anteriorly than strengthening in upper back.  PT encouraging continued stretching adding physioball for more intense stretching.   PT Next Visit Plan Continue toward goals: decreased tightness, pain and increased ROM/strengthening   Consulted and Agree with Plan of Care Patient        Problem List Patient Active Problem List   Diagnosis Date Noted  . Headache 05/13/2015  . Blurry vision 05/13/2015  . Perceived hearing changes 05/13/2015  . Migraine headache 04/29/2015  . Raynaud phenomenon 08/28/2013  . 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 Allergic conjunctivitis 05/09/2013  . Anxiety 03/09/2012  . Insomnia 03/09/2012  . Allergic rhinitis 03/09/2012    Malyiah Fellows, PT 07/04/2015, 1:04 PM  [facility] [address] [place], [place], [address] Phone: [phone]   Fax:  [phone]  Name: Brandi [place] MRN: [id number] Date of Birth: [birth year]

## 2015-07-04 NOTE — Patient Instructions (Signed)
Roll    Slowly, gently roll shoulder's backwards, in either sitting or standing. Repeat _10__ times. Do _1-2 times per day.  Copyright  VHI. All rights reserved.  Upper Limb Neural Tension: Radial I    Place right arm across low back and turn head down toward other side. Gently increase stretch by pulling down on head and depressing shoulder girdle, if needed. Alternate sides. Hold for 15 seconds. Repeat __3-5__ times each way. Do _1_ sets per session. Do 1-2___ sessions per day.  http://orth.exer.us/408   Copyright  VHI. All rights reserved.   Thoracic Self-Mobilization (Supine)    With rolled towel placed lengthwise at lower ribs level, lie back on towel with arms outstretched. Hold _2 minutes. Relax. *Good one to do at the end of the day or before bed.  *Can do same position over the physioball to tolerance.  http://orth.exer.us/1001   Copyright  VHI. All rights reserved.   Stand in a doorframe. Place R hand on the doorframe with fingers pointing down. Straighten elbow and turn your head to look down towards your left shoulder. Hold for 30 seconds. Relax, repeat 3 times.  Repeat on both sides *Good one to do after working at the computer.  AROM: Lateral Neck Flexion    Slowly tilt head toward one shoulder, then the other. Hold each position ___30_ seconds. Repeat _3___ times per set. Do __1__ sets per session.  *Repeat as needed for pain relief.  http://orth.exer.us/297   Copyright  VHI. All rights reserved.  Trunk: Extension (Prone)    Lie prone *arms by your side to start with palms facing up*.  Repeat _10_ times per set. 1-2 times/day.  http://plyo.exer.us/53   Copyright  VHI. All rights reserved.

## 2015-07-08 ENCOUNTER — Ambulatory Visit: Payer: BC Managed Care – PPO | Admitting: Rehabilitative and Restorative Service Providers"

## 2015-07-08 DIAGNOSIS — M542 Cervicalgia: Secondary | ICD-10-CM

## 2015-07-08 NOTE — Therapy (Signed)
[facility] [address] [place], [place], [address] Phone: [phone]   Fax:  [phone]  Physical Therapy Treatment  Patient Details  Name: Brandi [place] MRN: [id number] Date of Birth: [birth year] Referring Provider: Dr. Jaynee Eagles  Encounter Date: 07/08/2015      PT End of Session - 07/08/15 1400    Visit Number 4   Number of Visits 8   Date for PT Re-Evaluation 07/28/15   Authorization Type [employer] private insurance   PT Start Time 1020   PT Stop Time 1105   PT Time Calculation (min) 45 min   Activity Tolerance No increased pain;Patient tolerated treatment well   Behavior During Therapy [facility] for tasks assessed/performed      Past Medical History  Diagnosis Date  . Depression   . Anxiety   . Insomnia   . Vitamin D deficiency   . Allergic rhinitis, cause unspecified   . Abnormal Pap smear     ASCUS 08/1999; CIN 11/1999, s/p conization    Past Surgical History  Procedure Laterality Date  . Ankle surgery  child    left for torn ligaments, left  . Pilonidal cyst excision    . Cervical conization w/bx  2001    There were no vitals filed for this visit.  Visit Diagnosis:  Musculoskeletal neck pain      Subjective Assessment - 07/08/15 1026    Subjective The patient is using physioball for stretching at home.  She feels pain is less frequent.    Pertinent History possible h/o pinched nerve in neck years ago, with relief of symptoms.  Patient sees chiropractor for some pain mgmt.  Patient reports getting bored with exercises.   Patient Stated Goals reduce neck pain.   Currently in Pain? Yes   Pain Score --  "down a number" from last time.   Pain Location Neck   Pain Descriptors / Indicators Tightness   Pain Type Chronic pain   Pain Onset More than a month ago   Pain Frequency Constant   Aggravating Factors  unknown   Pain Relieving Factors stretching          [facility] Adult PT Treatment/Exercise -  07/08/15 1403    Exercises   Exercises Neck   Neck Exercises: Theraband   Scapula Retraction 10 reps   Scapula Retraction Limitations in "w" position standing, then added with green theraband in standing x 10 reps shoulder height   Neck Exercises: Seated   Cervical Rotation 5 reps  limitation noted to the left with no pain   Neck Exercises: Prone   Rows 10 reps;Weights  2 lbs with arm abducted @ 90 degrees bilaterally   Upper Extremity Flexion with Stabilization Flexion;10 reps  2 lbs with arm overhead    Lumbar Exercises: Quadruped   Opposite Arm/Leg Raise 10 reps   Opposite Arm/Leg Raise Limitations patient has been working on in home per report   Manual Therapy   Manual Therapy Soft tissue mobilization;Manual Traction   Manual therapy comments patient with limitations noted L cervical region   Soft tissue mobilization upper trap and scalene soft tissue mobilization   Manual Traction supine position with overpressure adding ROM into rotation with traction and sidebending            PT Long Term Goals - 06/28/15 1628    PT LONG TERM GOAL #1   Title The patient will return demo HEP for neck A/ROM, flexibility, postural stabilization.   Baseline Target  date 07/28/2015   Time 4   Period Weeks   PT LONG TERM GOAL #2   Title The patient will improve bilateral cervical rotation to > or equal to 65 degrees (R 56, L 49 at eval).   Baseline Target date 07/28/2015   Time 4   Period Weeks   PT LONG TERM GOAL #3   Title The patient will report pain < or equal to 4/10 in neck.   Baseline Target date 07/28/2015   Time 4   Period Weeks   PT LONG TERM GOAL #4   Title The patient will verbalize understanding of work station set-up for optimal Engineer, maintenance (IT) during work tasks.   Baseline Target date 07/28/2015   Time 4   Period Weeks               Plan - 07/08/15 1400    Clinical Impression Statement The patient has palpable tightness in L mid cervical region.  PT  reviewed h/o scans/imaging to reveal h/o L foraminal encroachment with bony overgrowth that correlates to decreased L rotation without pain (hard end feel for patient).  PT to progress education re: daily activities, home program and postural strengthening.   PT Next Visit Plan Continue toward goals: decreased tightness, pain and increased ROM/strengthening  *?consider home traction unit/ try in PT    Consulted and Agree with Plan of Care Patient        Problem List Patient Active Problem List   Diagnosis Date Noted  . Headache 05/13/2015  . Blurry vision 05/13/2015  . Perceived hearing changes 05/13/2015  . Migraine headache 04/29/2015  . Raynaud phenomenon 08/28/2013  . 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 Allergic conjunctivitis 05/09/2013  . Anxiety 03/09/2012  . Insomnia 03/09/2012  . Allergic rhinitis 03/09/2012    [name], PT 07/08/2015, 2:07 PM  [facility] [address] [place], [address] Phone: [phone]   Fax:  [phone]  Name: Brandi [place] MRN: [id number] Date of Birth: [birth year]

## 2015-07-11 ENCOUNTER — Ambulatory Visit: Payer: BC Managed Care – PPO | Admitting: Rehabilitative and Restorative Service Providers"

## 2015-07-11 DIAGNOSIS — M542 Cervicalgia: Secondary | ICD-10-CM

## 2015-07-11 NOTE — Patient Instructions (Signed)
Ice Massage and Cold Packs Home Program  Ice and cold packs are used so that we can return the muscle to it's natural resting state without causing more pain, which can lead to more spasm, etc.  Icing and cold packs are also used to reduce swelling, which can lead to pain and stiffness.  COLD PACKS Cold packs should be placed circumferentially around the swollen area (ie., the wrist, etc.).  A paper towel can be placed over the area before the cold pack is applied.  A towel may be wrapped around the outside of the cold pack to keep the cold in.  The swollen area should be elevated with the cold pack, if possible.  ICE MASSAGE Fill a 4 ounce paper cup three-quarters full and put it in a freezer until it is frozen.  When ready to use, tear off about 1 inch of the cup so that some of the ice is showing while the bottom of the cup can be used to hold onto. Massage the entire muscle area as instructed by your therapist.  You may use circular or up and down strokes, but do not hold the ice in one spot.  Four phases to the ice massage and cold pack application: 1. Cold: which you feel when you first apply the ice. 2. Ache: after a few minutes 3. Burning: after approximately five minutes, it will feel like your skin is burning.  At this point, remove the ice for a minute or so. 4. Numbness: THIS IS THE CRUCIAL PHASE!!! Return the ice or cold pack and massage until all the burning disappears.  This signals the end of cryotherapy.  The entire procedure should take ten to fifteen minutes while using the cold pack.  Do Not perform the ice massage for more than seven minutes on a small area or more than ten minutes on a large area.  Cryotherapy should be performed after exercises, when edema occurs, or when an area is painful. 

## 2015-07-12 NOTE — Therapy (Signed)
[facility] [address] [place], [place], [address] Phone: [phone]   Fax:  [phone]  Physical Therapy Treatment  Patient Details  Name: Brandi [place] MRN: [id number] Date of Birth: [birth year] Referring Provider: Dr. Jaynee Eagles  Encounter Date: 07/11/2015      PT End of Session - 07/11/15 1500    Visit Number 5   Number of Visits 8   Date for PT Re-Evaluation 07/28/15   Authorization Type [employer] private insurance   PT Start Time [phone]   PT Stop Time 1015   PT Time Calculation (min) 40 min   Activity Tolerance No increased pain;Patient tolerated treatment well   Behavior During Therapy [facility] for tasks assessed/performed      Past Medical History  Diagnosis Date  . Depression   . Anxiety   . Insomnia   . Vitamin D deficiency   . Allergic rhinitis, cause unspecified   . Abnormal Pap smear     ASCUS 08/1999; CIN 11/1999, s/p conization    Past Surgical History  Procedure Laterality Date  . Ankle surgery  child    left for torn ligaments, left  . Pilonidal cyst excision    . Cervical conization w/bx  2001    There were no vitals filed for this visit.  Visit Diagnosis:  Musculoskeletal neck pain      Subjective Assessment - 07/11/15 0941    Subjective The patient had increased pain yesterday and still hurts today.  "This is how it goes" reporting things flare up and then improve.   Patient with R UE numbness and flared up medial elbow discomfort.   Currently in Pain? Yes   Pain Score 7   annoying pain today   Pain Location Neck   Pain Descriptors / Indicators Tightness;Aching   Pain Type Chronic pain   Pain Onset More than a month ago   Pain Frequency Constant   Aggravating Factors  began hurting yesterday, unsure of onset   Pain Relieving Factors stretcing             [facility] Adult PT Treatment/Exercise - 07/11/15 1428    Self-Care   Self-Care Posture;Other Self-Care Comments   Posture  Reviewed work station set up.  Patient uses R arm mouse t/o the day and demonstrates that her R elbow is resting on support surface.   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  Discussed sleeping positions and demonstrated/had patient return demo using towel roll to support curve of neck and maintain neutral spinal alignment during sleeping.     Exercises   Exercises Neck;Other Exercises   Other Exercises  Demonstrated elbow stretching for pain along medial epicondyle.  Patient is point tender to palpation in one spot and anticipate she has recent onset of bursitis, although she has not seen MD for this pain.  Recommended ice massage and to f/u with MD if pain worsens/continues.                Neck Exercises: Seated   Cervical Rotation 5 reps   Shoulder Rolls Backwards;10 reps                         Manual Therapy   Manual Therapy Soft tissue mobilization;Manual Traction   Manual therapy comments patient with limitations noted L cervical region   Soft tissue mobilization  upper trap and scalene soft tissue mobilization, parascapular soft tissue mobilization along rhomboids and at origin of levator scapulae bilaterally   Manual Traction supine position with overpressure adding ROM into rotation with traction and sidebending           PT Education - 07/11/15 1459    Education provided Yes   Education Details ice massage elbow *print out for patient.   Person(s) Educated Patient   Methods Explanation;Demonstration;Handout   Comprehension Returned demonstration;Verbalized understanding             PT Long Term Goals - 07/11/15 1503    PT LONG TERM GOAL #1   Title The patient will return demo HEP for neck A/ROM, flexibility, postural stabilization.   Baseline Target date 07/28/2015   Time 4   Period Weeks   Status On-going   PT LONG TERM GOAL #2    Title The patient will improve bilateral cervical rotation to > or equal to 65 degrees (R 56, L 49 at eval).   Baseline Target date 07/28/2015   Time 4   Period Weeks   Status On-going   PT LONG TERM GOAL #3   Title The patient will report pain < or equal to 4/10 in neck.   Baseline Target date 07/28/2015   Time 4   Period Weeks   Status On-going   PT LONG TERM GOAL #4   Title The patient will verbalize understanding of work station set-up for optimal Engineer, maintenance (IT) during work tasks.   Baseline Target date 07/28/2015   Time 4   Period Weeks   Status Achieved               Plan - 07/11/15 1503    Clinical Impression Statement The patient had increased pain level today compared to earlier in the week, which limited activity in PT today  She reports this is typical where pain will increase for a few days and then return to a lower intensity baseline.  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  PT recommends ice massage to address.  PT and patient discussed progression of therapy.  If she continues with current HEP with no relief from flare up this week, then will try cervical traction to determine if that will improve self mgmt in the home due to chronic pain.     PT Next Visit Plan Determine if pain relieved from current HEP, progress postural stabilization emphasizing scapular retraction/depression and neck stabilization, ?consider home traction unit or referral to [facility] for cervical traction.   Consulted and Agree with Plan of Care Patient        Problem List Patient Active Problem List   Diagnosis Date Noted  . Headache 05/13/2015  . Blurry vision 05/13/2015  . Perceived hearing changes 05/13/2015  . Migraine headache 04/29/2015  . Raynaud phenomenon 08/28/2013  . Depression, major, in partial  remission ([place]) 08/28/2013  . Allergic conjunctivitis 05/09/2013  . Anxiety 03/09/2012  . Insomnia 03/09/2012  . Allergic rhinitis 03/09/2012    Treston Coker, PT 07/12/2015, 2:34 PM  [facility] [address] [place], [place], [address] Phone: [phone]   Fax:  [phone]  Name: Brandi [place] MRN: [id number] Date of Birth: [birth year]

## 2015-07-15 ENCOUNTER — Encounter: Payer: Self-pay | Admitting: Neurology

## 2015-07-16 ENCOUNTER — Encounter: Payer: Self-pay | Admitting: Physical Therapy

## 2015-07-16 ENCOUNTER — Ambulatory Visit: Payer: BC Managed Care – PPO | Attending: Neurology | Admitting: Physical Therapy

## 2015-07-16 DIAGNOSIS — M542 Cervicalgia: Secondary | ICD-10-CM | POA: Insufficient documentation

## 2015-07-16 NOTE — Therapy (Signed)
[facility] [address] [place], [place], [address] Phone: [phone]   Fax:  [phone]  Physical Therapy Treatment  Patient Details  Name: Brandi Carpenter MRN: [id number] Date of Birth: [birth year] Referring Provider: Dr. Jaynee Eagles  Encounter Date: 07/16/2015      PT End of Session - 07/16/15 0851    Visit Number 6   Number of Visits 8   Date for PT Re-Evaluation 07/28/15   Authorization Type [employer] private insurance   PT Start Time [phone]   PT Stop Time 0930   PT Time Calculation (min) 43 min   Activity Tolerance No increased pain;Patient tolerated treatment well   Behavior During Therapy [facility] for tasks assessed/performed      Past Medical History  Diagnosis Date  . Depression   . Anxiety   . Insomnia   . Vitamin D deficiency   . Allergic rhinitis, cause unspecified   . Abnormal Pap smear     ASCUS 08/1999; CIN 11/1999, s/p conization    Past Surgical History  Procedure Laterality Date  . Ankle surgery  child    left for torn ligaments, left  . Pilonidal cyst excision    . Cervical conization w/bx  2001    There were no vitals filed for this visit.  Visit Diagnosis:  Musculoskeletal neck pain      Subjective Assessment - 07/16/15 0849    Subjective No new complaints. Did a lot of stretching over the weekend. Numbness is better today.   Currently in Pain? Yes   Pain Score 4    Pain Descriptors / Indicators Tightness;Nagging;Numbness   Pain Type Chronic pain   Pain Radiating Towards right arm   Pain Onset More than a month ago   Pain Frequency Constant   Aggravating Factors  poor posture   Pain Relieving Factors streching     Treatment Manual therapy: to bil cervicals, upper traps, rhomboids and STM. Also to right subscapular area and rhomboid in left side lying. - soft tissue mobs - suboccipital release - trigger point release - positional release - passive stretching - gentle cervical  distraction Pt shown how to use the thera cane for trigger point release at home. Information given on name and cost via [employer] for pt to compare to other sites at home for purchase of one for home use.   Mechanical cervical traction 25-30 pounds of pull, static holds of 1- 3 minute x 5 reps  Exercises Hook lying over foam roll with arms out for chest/pec stretch x 1-2 minute holds  red pball at wall - rolling up/down with flexion stretch at top x 10 reps        PT Long Term Goals - 07/11/15 1503    PT LONG TERM GOAL #1   Title The patient will return demo HEP for neck A/ROM, flexibility, postural stabilization.   Baseline Target date 07/28/2015   Time 4   Period Weeks   Status On-going   PT LONG TERM GOAL #2   Title The patient will improve bilateral cervical rotation to > or equal to 65 degrees (R 56, L 49 at eval).   Baseline Target date 07/28/2015   Time 4   Period Weeks   Status On-going   PT LONG TERM GOAL #3   Title The patient will report pain < or equal to 4/10 in neck.   Baseline Target date 07/28/2015   Time 4   Period Weeks   Status On-going  PT LONG TERM GOAL #4   Title The patient will verbalize understanding of work station set-up for optimal body  mechanics during work tasks.   Baseline Target date 07/28/2015   Time 4   Period Weeks   Status Achieved           Plan - 07/16/15 0851    Clinical Impression Statement Pt in with less overall pain and tightness vs with previous session. Still with tigger points on right scapular areas (> than left side). Trialed mechanical cervical traction today with pt reporting some decrease in tightness, no real change in numbness.  Pt to continue to monitor how she feels after today's session for continued use of our home unit here or possible transfer to ortho clinic for there larger machine (cervical traction). Making steady porgress toward goals.                                            Pt will benefit from  skilled therapeutic intervention in order to improve on the following deficits Pain;Decreased mobility;Decreased range of motion;Increased muscle spasms;Impaired flexibility   Rehab Potential Good   Clinical Impairments Affecting Rehab Potential Patient reports getting bored easily with exercise.   PT Frequency 2x / week   PT Duration 4 weeks   PT Treatment/Interventions Moist Heat;Therapeutic exercise;Manual techniques;Therapeutic activities;ADLs/Self Care Home Management;Neuromuscular re-education;Traction;Electrical Stimulation;Cryotherapy;Patient/family education;Passive range of motion   PT Next Visit Plan Assess how pt responded to cervical traction  (continue here or transfer if good reponse), continue to work on scapular mobility, strengthening and stability. Advance HEP as needed.   Consulted and Agree with Plan of Care Patient        Problem List Patient Active Problem List   Diagnosis Date Noted  . Headache 05/13/2015  . Blurry vision 05/13/2015  . Perceived hearing changes 05/13/2015  . Migraine headache 04/29/2015  . Raynaud phenomenon 08/28/2013  . 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 Allergic conjunctivitis 05/09/2013  . Anxiety 03/09/2012  . Insomnia 03/09/2012  . Allergic rhinitis 03/09/2012    Willow Ora 07/16/2015, 9:47 AM  Willow Ora, PTA, [facility] [address], [place], [place] [address] [phone] 07/16/2015, 9:47 AM   Name: Brandi Carpenter MRN: [id number] Date of Birth: Sep 21, 1965

## 2015-07-23 ENCOUNTER — Ambulatory Visit: Payer: BC Managed Care – PPO | Admitting: Rehabilitative and Restorative Service Providers"

## 2015-07-23 ENCOUNTER — Encounter: Payer: Self-pay | Admitting: Rehabilitative and Restorative Service Providers"

## 2015-07-23 DIAGNOSIS — M542 Cervicalgia: Secondary | ICD-10-CM | POA: Diagnosis not present

## 2015-07-23 NOTE — Therapy (Signed)
[facility] [address] [place], [place], [address] Phone: [phone]   Fax:  [phone]  Physical Therapy Treatment  Patient Details  Name: Brandi Carpenter MRN: [id number] Date of Birth: October 01, 1965 Referring Provider: Dr. Jaynee Eagles  Encounter Date: 07/23/2015      PT End of Session - 07/23/15 1346    Visit Number 7   Number of Visits 8   Date for PT Re-Evaluation 07/28/15   Authorization Type [employer] private insurance   PT Start Time [phone]   PT Stop Time [phone]   PT Time Calculation (min) 45 min   Activity Tolerance No increased pain;Patient tolerated treatment well   Behavior During Therapy [facility] for tasks assessed/performed      Past Medical History  Diagnosis Date  . Depression   . Anxiety   . Insomnia   . Vitamin D deficiency   . Allergic rhinitis, cause unspecified   . Abnormal Pap smear     ASCUS 08/1999; CIN 11/1999, s/p conization    Past Surgical History  Procedure Laterality Date  . Ankle surgery  child    left for torn ligaments, left  . Pilonidal cyst excision    . Cervical conization w/bx  2001    There were no vitals filed for this visit.  Visit Diagnosis:  Musculoskeletal neck pain      Subjective Assessment - 07/23/15 0804    Subjective "I feel better" reporting she notes improved posture, however her neck still gets irritated.  Occasionally wakes with discomfort in R shoulder blade.  The patient is doing HEP regularly.  No big change after traction.   Patient Stated Goals reduce neck pain.   Currently in Pain? 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  THERAPEUTIC EXERCISE: Standing theraband exercises for scapular depression Reviewed physioball trunk extension stretch reaching to get anterior chest stretch as well as thoracic extension.  Seated neck stretches        PT Education - 07/23/15 1345    Education provided Yes   Education Details HEP: theraband scapular depression, quadriped UE/LE lifts over physioball   Person(s) Educated Patient   Methods Explanation;Demonstration;Handout   Comprehension Verbalized understanding;Returned demonstration             PT Long Term Goals - 07/23/15 0806    PT LONG TERM GOAL #1   Title The patient will return demo HEP for neck A/ROM, flexibility, postural stabilization.   Baseline Target date 07/28/2015   Time 4   Period Weeks   Status Achieved   PT LONG TERM GOAL #2   Title The patient will improve bilateral cervical rotation to > or equal to 65 degrees (R 56, L 49 at eval).   Baseline L 65 degrees,  R 72 degrees   Time 4   Period Weeks   Status Achieved   PT LONG TERM GOAL #3   Title The patient will report pain < or equal to 4/10 in neck.   Baseline Met with 2/10 on 07/23/2015   Time 4   Period Weeks   Status Achieved   PT LONG TERM GOAL #4   Title The patient will verbalize understanding of work station set-up for optimal body  mechanics during work tasks.   Baseline Target date 07/28/2015   Time 4   Period Weeks   Status Achieved               Plan - 07/23/15 1347    Clinical Impression Statement The patient met 4/4 LTGs.  PT recommends continued performance of HEP and discussed  need for continued exercise to manage chronic neck pain.   PT Next Visit Plan discharge today   Consulted and Agree with Plan of Care Patient        Problem List Patient Active Problem List   Diagnosis Date Noted  . Headache 05/13/2015  . Blurry vision 05/13/2015  . Perceived hearing changes 05/13/2015  . Migraine headache 04/29/2015  . Raynaud phenomenon 08/28/2013  . 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 Allergic conjunctivitis 05/09/2013  . Anxiety 03/09/2012  . Insomnia 03/09/2012  . Allergic rhinitis 03/09/2012    Bailea Beed, PT 07/23/2015, 1:48 PM  [facility] [address] [place], [place], [address] Phone: [phone]   Fax:  [phone]  Name: Brandi Carpenter MRN: [id number] Date of Birth: May 25, 1966

## 2015-07-23 NOTE — Therapy (Signed)
[facility] [address] [place], [place], [address] Phone: [phone]   Fax:  [phone]  Patient Details  Name: Brandi Carpenter MRN: [id number] Date of Birth: May 04, 1966 Referring Provider:  No ref. provider found  Encounter Date: 07/23/2015  PHYSICAL THERAPY DISCHARGE SUMMARY  Visits from Start of Care: 7  Current functional level related to goals / functional outcomes:     PT Long Term Goals - 07/23/15 0806    PT LONG TERM GOAL #1   Title The patient will return demo HEP for neck A/ROM, flexibility, postural stabilization.   Baseline Target date 07/28/2015   Time 4   Period Weeks   Status Achieved   PT LONG TERM GOAL #2   Title The patient will improve bilateral cervical rotation to > or equal to 65 degrees (R 56, L 49 at eval).   Baseline L 65 degrees, R 72 degrees   Time 4   Period Weeks   Status Achieved   PT LONG TERM GOAL #3   Title The patient will report pain < or equal to 4/10 in neck.   Baseline Met with 2/10 on 07/23/2015   Time 4   Period Weeks   Status Achieved   PT LONG TERM GOAL #4   Title The patient will verbalize understanding of work station set-up for optimal body  mechanics during work tasks.   Baseline Target date 07/28/2015   Time 4   Period Weeks   Status Achieved        Remaining deficits: Varying levels of pain Postural tightness with home program to address   Education / Equipment: HEP, need for continued exercise, work station set up, home stretching. Plan: Patient agrees to discharge.  Patient goals were met. Patient is being discharged due to meeting the stated rehab goals.  ?????       Thank you for the referral of this patient. Rudell Cobb, MPT  Jaiah Weigel 07/23/2015, 1:52 PM  [facility] [address] [place], [place], [address] Phone: [phone]   Fax:  [phone]

## 2015-07-23 NOTE — Patient Instructions (Signed)
Scapular Retraction: Rowing (Eccentric) - Arms - Side (Resistance Band)    Loop the theraband over a door for support.  Pull back and down with band keeping the elbow straight emphasizing shoulder blade back and down.  Begin with blue band and progress from 10 to 20 times.  Once easy, can switch to black band and begin at 8-10 repetitions and progress up to 15-20.   http://ecce.exer.us/227   Copyright  VHI. All rights reserved.  CORE CONDITIONING ON BALL: Back Extension    Inhaling, extend opposite arm and leg. Hold position for 5 seconds.  Then switch sides. Repeat _10__ times, alternating limbs. Do _1__ times per day.  Copyright  VHI. All rights reserved.

## 2015-07-25 ENCOUNTER — Other Ambulatory Visit: Payer: Self-pay | Admitting: Family Medicine

## 2015-07-26 ENCOUNTER — Encounter: Payer: Self-pay | Admitting: Gastroenterology

## 2015-07-27 ENCOUNTER — Other Ambulatory Visit: Payer: Self-pay | Admitting: Family Medicine

## 2015-07-28 ENCOUNTER — Other Ambulatory Visit: Payer: Self-pay | Admitting: Family Medicine

## 2015-08-01 ENCOUNTER — Other Ambulatory Visit: Payer: Self-pay | Admitting: Family Medicine

## 2015-08-01 ENCOUNTER — Ambulatory Visit (INDEPENDENT_AMBULATORY_CARE_PROVIDER_SITE_OTHER): Payer: BC Managed Care – PPO | Admitting: Family Medicine

## 2015-08-01 ENCOUNTER — Other Ambulatory Visit (HOSPITAL_COMMUNITY)
Admission: RE | Admit: 2015-08-01 | Discharge: 2015-08-01 | Disposition: A | Discharge status: Home / Self Care | Payer: BC Managed Care – PPO | Source: Ambulatory Visit | Attending: Family Medicine | Admitting: Family Medicine

## 2015-08-01 ENCOUNTER — Encounter: Payer: Self-pay | Admitting: Family Medicine

## 2015-08-01 VITALS — BP 130/86 | HR 72 | Ht 64.0 in | Wt 180.0 lb

## 2015-08-01 DIAGNOSIS — J309 Allergic rhinitis, unspecified: Secondary | ICD-10-CM | POA: Diagnosis not present

## 2015-08-01 DIAGNOSIS — E669 Obesity, unspecified: Secondary | ICD-10-CM | POA: Diagnosis not present

## 2015-08-01 DIAGNOSIS — Z01411 Encounter for gynecological examination (general) (routine) with abnormal findings: Secondary | ICD-10-CM | POA: Diagnosis present

## 2015-08-01 DIAGNOSIS — R7989 Other specified abnormal findings of blood chemistry: Secondary | ICD-10-CM

## 2015-08-01 DIAGNOSIS — Z0000 Encounter for general adult medical examination without abnormal findings: Secondary | ICD-10-CM | POA: Diagnosis not present

## 2015-08-01 DIAGNOSIS — Z23 Encounter for immunization: Secondary | ICD-10-CM | POA: Diagnosis not present

## 2015-08-01 DIAGNOSIS — R945 Abnormal results of liver function studies: Secondary | ICD-10-CM

## 2015-08-01 DIAGNOSIS — Z1151 Encounter for screening for human papillomavirus (HPV): Secondary | ICD-10-CM | POA: Diagnosis not present

## 2015-08-01 DIAGNOSIS — Z1231 Encounter for screening mammogram for malignant neoplasm of breast: Secondary | ICD-10-CM

## 2015-08-01 DIAGNOSIS — F325 Major depressive disorder, single episode, in full remission: Secondary | ICD-10-CM | POA: Diagnosis not present

## 2015-08-01 LAB — POCT URINALYSIS DIPSTICK
BILIRUBIN UA: NEGATIVE
Glucose, UA: NEGATIVE
Ketones, UA: NEGATIVE
LEUKOCYTES UA: NEGATIVE
NITRITE UA: NEGATIVE
PH UA: 6
PROTEIN UA: NEGATIVE
RBC UA: NEGATIVE
Spec Grav, UA: 1.025
UROBILINOGEN UA: NEGATIVE

## 2015-08-01 LAB — HEPATIC FUNCTION PANEL
ALK PHOS: 48 U/L (ref 33–130)
ALT: 33 U/L — AB (ref 6–29)
AST: 19 U/L (ref 10–35)
Albumin: 4.3 g/dL (ref 3.6–5.1)
BILIRUBIN INDIRECT: 0.5 mg/dL (ref 0.2–1.2)
Bilirubin, Direct: 0.1 mg/dL (ref <=0.2)
TOTAL PROTEIN: 7 g/dL (ref 6.1–8.1)
Total Bilirubin: 0.6 mg/dL (ref 0.2–1.2)

## 2015-08-01 LAB — GLUCOSE, RANDOM: GLUCOSE: 88 mg/dL (ref 65–99)

## 2015-08-01 MED ORDER — CITALOPRAM HYDROBROMIDE 20 MG PO TABS: ORAL_TABLET | ORAL | Status: DC

## 2015-08-01 MED ORDER — PHENTERMINE-TOPIRAMATE ER 3.75-23 MG PO CP24: 1.0000 | ORAL_CAPSULE | Freq: Every day | ORAL | Status: DC

## 2015-08-01 MED ORDER — PHENTERMINE-TOPIRAMATE ER 7.5-46 MG PO CP24: 1.0000 | ORAL_CAPSULE | Freq: Every day | ORAL | Status: DC

## 2015-08-01 MED ORDER — BUPROPION HCL ER (XL) 300 MG PO TB24: ORAL_TABLET | ORAL | Status: DC

## 2015-08-01 MED ORDER — ZOLPIDEM TARTRATE 10 MG PO TABS: ORAL_TABLET | ORAL | Status: DC

## 2015-08-01 MED ORDER — LEVOCETIRIZINE DIHYDROCHLORIDE 5 MG PO TABS: ORAL_TABLET | ORAL | Status: DC

## 2015-08-01 NOTE — Progress Notes (Signed)
Chief Complaint  Patient presents with  . Annual Exam    fasting annual with pap. Would like to discuss her weight again, still not losing.    Brandi Carpenter is a 49 y.o. female who presents for a complete physical.  She has the following concerns:  Weight gain--she has gained 16# in the last year.  She is trying to eat healthier (more fresh, less processed). She has been using [employer] for the last 4 years. She does a home workout 2x/week, walks daily at least a mile (does stairs in the parking deck).  Clothes don't fit, rings don't fit, feet and hands feel swollen.  Denies change in sodium intake.  She had lost from 170 down to 150 with MFP, had gained some back when less digilient, but has been diligent over the last 6 months and she reports she continues to gain.  She was 175# at home this morning, weighs herself daily, and has seen a gradual change/increase.  Saw neuro in August for headaches. She was started on nortriptyline qHS, This was stopped just about 3 weeks ago due to ringing in her ears. This resolved since stopping it.  She was taking 28m.  She hasn't been back to see them yet, has appt in December.  At her initial visit, MRI was ordered (was normal), and she was referred for PT.   Just completed PT for neck. She feels like her neck and shoulders have loosened some. She continues home exercise program.  Depression/Anxiety: She reports moods are good on her current regimen. Denies any side effects.Feeling frustrated today regarding inability to lose weight, but overall her moods are very well controlled on this regimen.  Insomnia: intermittent. Uses 1/2-1 tablet of ambien as needed, with good results and no side effects. she still uses about 10/month, feels like her sleeping is getting a little worse.  Allergies: Under the care of allergist, is on immunotherapy. She recently went to ENT for trouble with her ears, and was put on steroid course.  This usually happens twice  a year.  Tubes have been suggested if frequency increases. Hearing is already improving since starting the steroids.  She used a course of Provera in August, and she has been having monthly cycles since then.  She has some night sweats.  She has tried some black cohosh which helps some.  Immunization History  Administered Date(s) Administered  . Influenza Split 07/14/2014  . Influenza,inj,Quad PF,36+ Mos 06/28/2013  . Tdap 05/11/2012   Last Pap smear: 04/2012 Last mammogram: years ago (likely prior to '06) ; she admits that her problem is picking up the phone to schedule it--she would be happy to get one done, doesn't oppose it. Last colonoscopy: scheduled for January.  Last DEXA: never  Dentist: twice a year Ophtho: 5 years ago  Exercise:strength program ([employer] training camp on the Wii) twice/week, plus walks at least a mile a day, plus yardwork.  Averages 9K-10K  Past Medical History  Diagnosis Date  . Depression   . Anxiety   . Insomnia   . Vitamin D deficiency   . Allergic rhinitis, cause unspecified   . Abnormal Pap smear     ASCUS 08/1999; CIN 11/1999, s/p conization    Past Surgical History  Procedure Laterality Date  . Ankle surgery  child    left for torn ligaments, left  . Pilonidal cyst excision    . Cervical conization w/bx  2001    Social History   Social History  .  Marital Status: Single    Spouse Name: N/A  . Number of Children: 0  . Years of Education: 16   Occupational History  . Facilities manager   Social History Main Topics  . Smoking status: Never Smoker   . Smokeless tobacco: Never Used  . Alcohol Use: No  . Drug Use: No  . Sexual Activity:    Partners: Female   Other Topics Concern  . Not on file   Social History Narrative   Lives with female partner, 2 beagles   Caffeine use: drinks soda 3 drinks per day    Family History  Problem Relation Age of Onset  . Hypertension Mother   . Diabetes Mother   . Heart disease Father    . Depression Sister   . Colon cancer Maternal Grandmother      died at 66  . Diabetes Maternal Grandmother   . Breast cancer Cousin   . Alzheimer's disease Paternal Grandmother   . Colon polyps Maternal Uncle   . Migraines Neg Hx     Outpatient Encounter Prescriptions as of 08/01/2015  Medication Sig Note  . ALPRAZolam (XANAX) 0.5 MG tablet Take 0.5-1 tablets (0.25-0.5 mg total) by mouth 3 (three) times daily as needed for anxiety.   Marland Kitchen buPROPion (WELLBUTRIN XL) 300 MG 24 hr tablet take 1 tablet by mouth every morning   . Cholecalciferol (VITAMIN D) 2000 UNITS tablet Take 2,000 Units by mouth daily.   . citalopram (CELEXA) 20 MG tablet take 1 tablet by mouth once daily   . fluticasone (FLONASE) 50 MCG/ACT nasal spray instill 2 sprays into each nostril once daily   . GuaiFENesin (MUCINEX PO) Take 800 mg by mouth 4 (four) times daily. 07/30/2014: Has 12 hour, Max strength (1267m) twice daily  . levocetirizine (XYZAL) 5 MG tablet take 1 tablet by mouth every evening   . Multiple Vitamins-Minerals (MULTIVITAMIN PO) Take 1 tablet by mouth daily.   .Marland Kitchenolopatadine (PATANOL) 0.1 % ophthalmic solution Place 1 drop into both eyes 2 (two) times daily.   . Olopatadine HCl (PATANASE) 0.6 % SOLN Place 1-2 each into the nose 2 (two) times daily.   . Omega-3 Fatty Acids (FISH OIL) 1200 MG CAPS Take 2,400 mg by mouth daily.   .Marland KitchenOVER THE COUNTER MEDICATION  08/01/2015: Nature's Bounty AM/PM menopausal supplement  . predniSONE (STERAPRED UNI-PAK 21 TAB) 10 MG (21) TBPK tablet See admin instructions. 08/01/2015: Received from: External Pharmacy  . [DISCONTINUED] buPROPion (WELLBUTRIN XL) 300 MG 24 hr tablet take 1 tablet by mouth every morning   . [DISCONTINUED] citalopram (CELEXA) 20 MG tablet take 1 tablet by mouth once daily   . [DISCONTINUED] levocetirizine (XYZAL) 5 MG tablet take 1 tablet by mouth every evening   . [DISCONTINUED] medroxyPROGESTERone (PROVERA) 10 MG tablet Take 1 tablet (10 mg total)  by mouth daily.   . [DISCONTINUED] nortriptyline (PAMELOR) 10 MG capsule Take 2 capsules (20 mg total) by mouth at bedtime.   .Marland KitchenEPIPEN 2-PAK 0.3 MG/0.3ML SOAJ injection use as directed if needed PER INJECTION 05/13/2015: Received from: External Pharmacy  . Phentermine-Topiramate (QSYMIA) 3.75-23 MG CP24 Take 1 capsule by mouth daily.   . Phentermine-Topiramate (QSYMIA) 7.5-46 MG CP24 Take 1 capsule by mouth daily.   . pseudoephedrine (SUDAFED) 30 MG tablet Take 30 mg by mouth every 4 (four) hours as needed for congestion. 04/29/2015: Uses prn; last dose was yesterday afternoon  . zolpidem (AMBIEN) 10 MG tablet take 1/2 to 1 tablet by mouth  at bedtime if needed for sleep   . [DISCONTINUED] baclofen (LIORESAL) 10 MG tablet take 1 tablet by mouth UP TO twice a day if needed for headache  ...  (REFER TO PRESCRIPTION NOTES). 04/29/2015: Received from: External Pharmacy  . [DISCONTINUED] gabapentin (NEURONTIN) 300 MG capsule Take 900 mg by mouth daily. 04/29/2015: 2 at bedtime, and 1 in the morning for the last month; hasn't taken it in 2 days  . [DISCONTINUED] zolpidem (AMBIEN) 10 MG tablet take 1/2 to 1 tablet by mouth at bedtime if needed for sleep    No facility-administered encounter medications on file as of 08/01/2015.    Allergies  Allergen Reactions  . Codeine Itching and Nausea And Vomiting   ROS: The patient denies anorexia, fever, vision changes, decreased hearing (im;proving since being on steroids), ear pain, sore throat, breast concerns, chest pain, palpitations, dizziness, syncope, dyspnea on exertion, cough, swelling, nausea, vomiting, diarrhea, constipation, abdominal pain, melena, hematochezia, indigestion/heartburn, hematuria, incontinence, dysuria, vaginal discharge, odor or itch, genital lesions, joint pains, numbness, tingling, weakness, tremor, suspicious skin lesions, abnormal bleeding/bruising, or enlarged lymph nodes.  Periods are light, monthly again since taking provera course  in August. Mild hot flashes at night. Headaches improved. Recently stopped nortriptylene, haven't recurred yet. Lump on right foot hasn't changed, nontender Some mild discomfort in her right lower back into her buttock occasionally, not any worse than in the past.  PHYSICAL EXAM:  BP 130/86 mmHg  Pulse 72  Ht 5' 4" (1.626 m)  Wt 180 lb (81.647 kg)  BMI 30.88 kg/m2  LMP 07/09/2015  Vision 20/20 today  General Appearance:  Alert, cooperative, no distress, appears stated age.Frequent throat-clearing during visit  Head:  Normocephalic, without obvious abnormality, atraumatic   Eyes:  PERRL, conjunctiva/corneas clear, EOM's intact, fundi  benign   Ears:  Normal TM's and external ear canals   Nose:  Nares normal, mucosa only mildly edematous, no drainage or sinus tenderness   Throat:  Lips, mucosa, and tongue normal; teeth and gums normal   Neck:  Supple, no lymphadenopathy; thyroid: no enlargement/tenderness/nodules; no carotid  bruit or JVD   Back:  Spine nontender, no curvature, ROM normal, no CVA tenderness   Lungs:  Clear to auscultation bilaterally without wheezes, rales or ronchi; respirations unlabored   Chest Wall:  No tenderness or deformity   Heart:  Regular rate and rhythm, S1 and S2 normal, no murmur, rub  or gallop   Breast Exam:  No tenderness, masses, or nipple discharge or inversion. No axillary lymphadenopathy. Some fibroglandular changes and mild tenderness bilaterally, especially UOQ's. L>R. No dominant masses  Abdomen:  Soft, non-tender, nondistended, normoactive bowel sounds,  no masses, no hepatosplenomegaly   Genitalia:  Normal external genitalia without lesions. BUS and vagina normal; no cervical motion tenderness. No abnormal vaginal discharge. Uterus and adnexa not enlarged, nontender, no masses. Pap performed. Some mild pelvic relaxation noted (especially with cough), but no significant prolapse noted   Rectal:  Normal tone, no masses or tenderness; Small rectocele noted. Stool is heme negative  Extremities:  No clubbing, cyanosis or edema   Pulses:  2+ and symmetric all extremities   Skin:  Skin color, texture, turgor normal, no rashes.  Lymph nodes:  Cervical, supraclavicular, and axillary nodes normal   Neurologic:  CNII-XII intact, normal strength, sensation and gait; reflexes 2+ and symmetric throughout    Psych: Normal mood, affect, hygiene and grooming        Lab Results  Component Value Date  TSH 0.903 04/29/2015     Chemistry      Component Value Date/Time   NA 142 04/29/2015 0001   K 4.4 04/29/2015 0001   CL 103 04/29/2015 0001   CO2 27 04/29/2015 0001   BUN 10 04/29/2015 0001   CREATININE 1.00 04/29/2015 0001      Component Value Date/Time   CALCIUM 9.7 04/29/2015 0001   ALKPHOS 62 04/29/2015 0001   AST 30 04/29/2015 0001   ALT 49* 04/29/2015 0001   BILITOT 0.7 04/29/2015 0001     Lab Results  Component Value Date   WBC 9.0 04/29/2015   HGB 12.8 04/29/2015   HCT 38.6 04/29/2015   MCV 90.0 04/29/2015   PLT 226 04/29/2015   Vitamin D-OH 45 in 04/2015  FSH 21.8  BNP and B12 were also normal.   ASSESSMENT/PLAN:   Annual physical exam - Plan: POCT Urinalysis Dipstick, Visual acuity screening, Glucose, random, Hepatic function panel, Cytology - PAP [place]  Need for prophylactic vaccination and inoculation against influenza - Plan: Flu Vaccine QUAD 36+ mos PF IM (Fluarix & Fluzone Quad PF)  Elevated LFTs - recheck  Obesity (BMI 30-39.9) - counseled extensively re: diet, exercise.  Do not check weight daily; increase exexrcise; maintain adequate intake; continue MFP - Plan: Glucose, random, Phentermine-Topiramate (QSYMIA) 3.75-23 MG CP24, Phentermine-Topiramate (QSYMIA) 7.5-46 MG CP24  Depression, major, in remission ([place]) - Plan: buPROPion (WELLBUTRIN XL) 300 MG 24 hr tablet, citalopram (CELEXA) 20  MG tablet  Allergic rhinitis, unspecified allergic rhinitis type - controlled - Plan: levocetirizine (XYZAL) 5 MG tablet   Discussed monthly self breast exams and yearly mammograms after the age of 40--she is past due. Counseled re: risks/benefits, reasons to get for earlier diagnosis--we scheduled appt for her; at least 30 minutes of aerobic activity at least 5 days/week, weight-bearing exercise at least 2x/wk; proper sunscreen use reviewed; healthy diet, including goals of calcium and vitamin D intake and alcohol recommendations (less than or equal to 1 drink/day) reviewed; regular seatbelt use; changing batteries in smoke detectors. Immunization recommendations discussed, flu shot given. Colonoscopy recommendations reviewed--scheduled.  Schedule routine eye exam.  Glucose, LFT's today Lipids normal last year. Vitamin D and TSH and c-met all recently normal except for one liver test.   Verdene Lennert to schedule 3D mammogram (screening) for patient. Willing to pay $50 if needed for the 3D.  Qsymia  3.75/23 x 2 weeks, then increase to 7.5/46 x 1 month.  Voucher given. Risks/side effects reviewed  dioscussed waiting to see neuro but she declines. Wants to start something now. We had discussed seeing neuro and discussing possibly topamax for headaches, and if no benefit, we could add a low dose phentermine (to mimic the Qsymia).  She preferrred to start Qsymia right away.     F/u 6 weeks on weight/meds  Counseled extensively re: healthy diet, portions, exercise, expectations for weight loss, risk/side effects of meds. Total visit time 55 minutes

## 2015-08-01 NOTE — Patient Instructions (Signed)

## 2015-08-05 LAB — CYTOLOGY - PAP

## 2015-08-15 ENCOUNTER — Encounter: Payer: Self-pay | Admitting: Neurology

## 2015-08-15 ENCOUNTER — Ambulatory Visit (INDEPENDENT_AMBULATORY_CARE_PROVIDER_SITE_OTHER): Payer: BC Managed Care – PPO | Admitting: Neurology

## 2015-08-15 VITALS — BP 120/79 | HR 69 | Ht 64.0 in | Wt 177.8 lb

## 2015-08-15 DIAGNOSIS — G43709 Chronic migraine without aura, not intractable, without status migrainosus: Secondary | ICD-10-CM | POA: Diagnosis not present

## 2015-08-15 NOTE — Progress Notes (Signed)
[id number]    Provider: Dr Jaynee Eagles Referring Provider: Rita Ohara, MD Primary Care Physician: Vikki Ports, MD  CC: Migraines  Interval history 08/15/2015: Discussed botox. She has headaches daily. Migrains started in her 47s. She feels nauseated, She always wears sunglasses, noise bothers her. Pain is behind the eyes. Throbbing. Pounding. At least half of her daily headaches are migrainous. This has been occurring for over 6 months. No medication overuse headaches, she does not take OTC meds.  She has associated neck stiffness and pain. There is no medication overuse. She has failed nortriptyline, zonegran and is now on Topamax. She has tried trigger point injections, massage and recently completed physical therapy. She is a good candidate for botox, and I feel the injection in the cervical muscles as part of the protocol will be especially helpful for her neck and shoulder pain.  HPI: Brandi Carpenter is a 49 y.o. female here as a referral from Dr. Tomi Bamberger for Migraines. She thought it was sinus headaches. She has been seeing an allergist and she saw an ENT and her sinuses were fine. Migraines for years since her 55s. No FHx of migraines. She feels nauseated, She always wears sunglasses, noise bothers her. It is behind the eyes. Throbbing. Pounding. She has daily headaches that are pressure type headaches. She has to sit still and is sick to her stomach, needs a quiet room. The noise is a significant trigger. At least twice a week. No aura. Her neck is very tight. Tingling in the right arm. Started with zonegran. It made her agitated. It never worksed. Tried gabapentin. She had trigger point injections and it didn't help. She feels like she has fluid in her ears and she gets off balance sometimes. Antibiotics have never done anything to hellp. Steroids helps and she feels wonderful for a while. Her sleeping varies, she takes [place]. She wakes frequently. She started snoring but not  normally. She has pressure in her ear today. Massages and chiropractors have helped in the past. Also endorses blurry vision. No other focal neurologic deficits.   Reviewed notes, labs and imaging from outside physicians, which showed:  Personally reviewed MRI of the cervical spine from April 2009: Findings: Normal cervical alignment. There is straightening of the cervical lordosis. There is no fracture or mass lesion. The cord appears normal.  C2-3: Negative C3-4: Mild disc degeneration and early spondylosis and facet arthropathy. C4-5: Mild disc degeneration. There is bilateral uncinate spurring with mild biforaminal narrowing. There is left foraminal encroachment. Central canal is patent. There is a tiny central disc protrusion. C5-6: Disc degeneration with spondylosis. There is a very small central disc protrusion and there is diffuse uncinate hypertrophy. There is left foraminal encroachment and left facet arthropathy. There is no cord deformity. C6-7: Mild disc degeneration and very mild uncinate spurring  C7-T1: Negative  CMP unremarkable, CBC unremarkable, TSH within normal limits, B12 690,  IMPRESSION: Multilevel cervical degenerative changes as above. There is no cord deformity or significant central canal stenosis. There is left foraminal encroachment at C4-5 and C5-6 due to uncinate spurring and facet overgrowth.  Review of Systems: Patient complains of symptoms per HPI as well as the following symptoms: Weight gain, blurred vision, shortness of breath, joint pain, spinning sensation, allergies, headache, slurred speech, dizziness, depression, restless legs Pertinent negatives per HPI. All others negative.  Social History   Social History  . Marital Status: Single    Spouse Name: N/A  . Number of Children: 0  .  Years of Education: 16   Occupational History  . Facilities manager   Social History Main Topics  . Smoking status: Never Smoker   .  Smokeless tobacco: Never Used  . Alcohol Use: No  . Drug Use: No  . Sexual Activity:    Partners: Female   Other Topics Concern  . Not on file   Social History Narrative   Lives with female partner, 2 beagles   Caffeine use: drinks soda 3 drinks per day    Family History  Problem Relation Age of Onset  . Hypertension Mother   . Diabetes Mother   . Heart disease Father   . Depression Sister   . Colon cancer Maternal Grandmother      died at 17  . Diabetes Maternal Grandmother   . Breast cancer Cousin   . Alzheimer's disease Paternal Grandmother   . Colon polyps Maternal Uncle   . Migraines Neg Hx     Past Medical History  Diagnosis Date  . Depression   . Anxiety   . Insomnia   . Vitamin D deficiency   . Allergic rhinitis, cause unspecified   . Abnormal Pap smear     ASCUS 08/1999; CIN 11/1999, s/p conization    Past Surgical History  Procedure Laterality Date  . Ankle surgery  child    left for torn ligaments, left  . Pilonidal cyst excision    . Cervical conization w/bx  2001    Current Outpatient Prescriptions  Medication Sig Dispense Refill  . ALPRAZolam (XANAX) 0.5 MG tablet Take 0.5-1 tablets (0.25-0.5 mg total) by mouth 3 (three) times daily as needed for anxiety. 20 tablet 3  . buPROPion (WELLBUTRIN XL) 300 MG 24 hr tablet take 1 tablet by mouth every morning 90 tablet 3  . Cholecalciferol (VITAMIN D) 2000 UNITS tablet Take 2,000 Units by mouth daily.    . citalopram (CELEXA) 20 MG tablet take 1 tablet by mouth once daily 90 tablet 3  . EPIPEN 2-PAK 0.3 MG/0.3ML SOAJ injection use as directed if needed PER INJECTION  0  . fluticasone (FLONASE) 50 MCG/ACT nasal spray instill 2 sprays into each nostril once daily 16 g 11  . GuaiFENesin (MUCINEX PO) Take 800 mg by mouth 4 (four) times daily.    Marland Kitchen levocetirizine (XYZAL) 5 MG tablet take 1 tablet by mouth every evening 30 tablet 11  . Multiple Vitamins-Minerals (MULTIVITAMIN PO) Take 1 tablet by mouth daily.     Marland Kitchen olopatadine (PATANOL) 0.1 % ophthalmic solution Place 1 drop into both eyes 2 (two) times daily. 5 mL 11  . Olopatadine HCl (PATANASE) 0.6 % SOLN Place 1-2 each into the nose 2 (two) times daily.    . Omega-3 Fatty Acids (FISH OIL) 1200 MG CAPS Take 2,400 mg by mouth daily.    Marland Kitchen OVER THE COUNTER MEDICATION     . Phentermine-Topiramate (QSYMIA) 3.75-23 MG CP24 Take 1 capsule by mouth daily. 14 capsule 0  . Phentermine-Topiramate (QSYMIA) 7.5-46 MG CP24 Take 1 capsule by mouth daily. 30 capsule 0  . predniSONE (STERAPRED UNI-PAK 21 TAB) 10 MG (21) TBPK tablet See admin instructions.  0  . pseudoephedrine (SUDAFED) 30 MG tablet Take 30 mg by mouth every 4 (four) hours as needed for congestion.    Marland Kitchen zolpidem (AMBIEN) 10 MG tablet take 1/2 to 1 tablet by mouth at bedtime if needed for sleep 30 tablet 0   No current facility-administered medications for this visit.    Allergies as  of 08/15/2015 - Review Complete 08/01/2015  Allergen Reaction Noted  . Codeine Itching and Nausea And Vomiting 05/09/2012    Vitals: LMP 07/09/2015 Last Weight:  Wt Readings from Last 1 Encounters:  08/01/15 180 lb (81.647 kg)   Last Height:   Ht Readings from Last 1 Encounters:  08/01/15 5\' 4"  (1.626 m)   Neuro: Detailed Neurologic Exam  Speech:  Speech is normal; fluent and spontaneous with normal comprehension.  Cognition:  The patient is oriented to person, place, and time;   recent and remote memory intact;   language fluent;   normal attention, concentration,   fund of knowledge Cranial Nerves:  The pupils are equal, round, and reactive to light. The fundi are normal and spontaneous venous pulsations are present. Visual fields are full to finger confrontation. Extraocular movements are intact. Trigeminal sensation is intact and the muscles of mastication are normal. The face is symmetric. The palate elevates in the midline. Hearing intact. Voice is normal. Shoulder shrug is  normal. The tongue has normal motion without fasciculations.   Coordination:  Normal finger to nose and heel to shin. Normal rapid alternating movements.   Gait:  Heel-toe and tandem gait are normal.   Motor Observation:  No asymmetry, no atrophy, and no involuntary movements noted. Tone:  Normal muscle tone.   Posture:  Posture is normal. normal erect   Strength:  Strength is V/V in the upper and lower limbs.    Sensation: intact to LT   Reflex Exam:  DTR's:  Deep tendon reflexes in the upper and lower extremities are normal bilaterally.  Toes:  The toes are downgoing bilaterally.  Clonus:  Clonus is absent.      Assessment/Plan: 49 year old female with chronic migraines, without aura, not intractable, without status migrainosus.  She is a good candidate for botox, and I feel the injection in the cervical muscles as part of the protocol will be especially helpful for her neck and shoulder pain.  Sarina Ill, MD  [facility] [address] [place], [place] [address]  Phone [phone] Fax [phone]  A total of 30 minutes was spent face-to-face with this patient. Over half this time was spent on counseling patient on the migraine diagnosis and different diagnostic and therapeutic options available.

## 2015-08-16 ENCOUNTER — Other Ambulatory Visit: Payer: Self-pay | Admitting: Family Medicine

## 2015-09-06 ENCOUNTER — Ambulatory Visit
Admission: RE | Admit: 2015-09-06 | Discharge: 2015-09-06 | Disposition: A | Discharge status: Home / Self Care | Payer: BC Managed Care – PPO | Source: Ambulatory Visit | Attending: Family Medicine | Admitting: Family Medicine

## 2015-09-06 DIAGNOSIS — Z1231 Encounter for screening mammogram for malignant neoplasm of breast: Secondary | ICD-10-CM

## 2015-09-11 ENCOUNTER — Other Ambulatory Visit: Payer: Self-pay | Admitting: Family Medicine

## 2015-09-11 DIAGNOSIS — R928 Other abnormal and inconclusive findings on diagnostic imaging of breast: Secondary | ICD-10-CM

## 2015-09-12 ENCOUNTER — Ambulatory Visit (INDEPENDENT_AMBULATORY_CARE_PROVIDER_SITE_OTHER): Payer: BC Managed Care – PPO | Admitting: Family Medicine

## 2015-09-12 ENCOUNTER — Encounter: Payer: Self-pay | Admitting: Family Medicine

## 2015-09-12 VITALS — BP 110/68 | HR 78 | Resp 14 | Wt 175.6 lb

## 2015-09-12 DIAGNOSIS — H938X1 Other specified disorders of right ear: Secondary | ICD-10-CM

## 2015-09-12 DIAGNOSIS — E669 Obesity, unspecified: Secondary | ICD-10-CM

## 2015-09-12 DIAGNOSIS — J309 Allergic rhinitis, unspecified: Secondary | ICD-10-CM

## 2015-09-12 MED ORDER — PHENTERMINE-TOPIRAMATE ER 11.25-69 MG PO CP24: 1.0000 | ORAL_CAPSULE | Freq: Every day | ORAL | Status: DC

## 2015-09-12 NOTE — Progress Notes (Signed)
Chief Complaint  Patient presents with  . Follow-up    f/u for weight and meds  . ear issues    right ear is stopped up since october. saw allergist and Dr. Benjamine Mola and both put her on Prednisone which helped for a couple days    Patient presents for follow up on weight/Qsymia. She titrated up to the 7.5mg  dose and isn't have any side effects.  "I feel better about my weight"--not fighting to keep from gaining. Since being on the Qsymia, she has taken 2 steroid courses (see below)--didn't get hungry or gain weight like with prednisone in the past. She does a home workout 2x/week, walks daily at least a mile. 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  Headaches are stable, not worse, no significant improvement noted yet since starting the Qsymia (topamax).  Getting headaches once a week, 5/10 in intensity.  She saw Dr. Benjamine Mola mid-November, and is having ongoing ear complaints.   Right ear is stopped up, everything is muffled. She was put on prednisone, to take down the swelling. It worked for only 3 days.  She saw him for f/u, and got a tube in her ear on 12/2.  Pressure is gone, but hearing still sounds very muffled.  He did hearing evaluation was reportedly normal.  She then started having sinus pressure, teeth pain. No discolored mucus.  She saw Dr. Harold Hedge, who put her on antibiotics for ear infection and sinusitis.  She was put on Cefdinir x 10 days and another course of prednisone.  She was started on this 12/12, finished the 10 day course of antibiotics, and doesn't feel better.  Taking benadryl, which makes her sleepy.  Sinus pressure is mostly on the right.  Continues to do sinus rinses, not getting much beyond what she typically gets--nothing thick or discolored. She continues to use her regularly daily meds, including mucinex, xyzal, flonase, patanase, sudafed.  She is very frustrated with this. She has f/u scheduled 1/3 with Dr. Benjamine Mola. She is  questioning his care, wondering how I feel about him as ENT  Chart reviewed-- CT sinuses done 12/2013 and were normal, mild deviated septum.  PMH, PSH, SH reviewed.  Outpatient Encounter Prescriptions as of 09/12/2015  Medication Sig Note  . ALPRAZolam (XANAX) 0.5 MG tablet Take 0.5-1 tablets (0.25-0.5 mg total) by mouth 3 (three) times daily as needed for anxiety.   Marland Kitchen buPROPion (WELLBUTRIN XL) 300 MG 24 hr tablet take 1 tablet by mouth every morning   . Cholecalciferol (VITAMIN D) 2000 UNITS tablet Take 2,000 Units by mouth daily.   . citalopram (CELEXA) 20 MG tablet take 1 tablet by mouth once daily   . EPIPEN 2-PAK 0.3 MG/0.3ML SOAJ injection use as directed if needed PER INJECTION 05/13/2015: Received from: External Pharmacy  . fluticasone (FLONASE) 50 MCG/ACT nasal spray instill 2 sprays into each nostril once daily   . GuaiFENesin (MUCINEX PO) Take 800 mg by mouth 4 (four) times daily. 07/30/2014: Has 12 hour, Max strength (1200mg ) twice daily  . levocetirizine (XYZAL) 5 MG tablet take 1 tablet by mouth every evening   . Multiple Vitamins-Minerals (MULTIVITAMIN PO) Take 1 tablet by mouth daily.   Marland Kitchen olopatadine (PATANOL) 0.1 % ophthalmic solution Place 1 drop into both eyes 2 (two) times daily.   . Olopatadine HCl (PATANASE) 0.6 % SOLN Place 1-2 each into the nose 2 (two) times daily.   . Omega-3 Fatty Acids (FISH  OIL) 1200 MG CAPS Take 2,400 mg by mouth daily.   Marland Kitchen OVER THE COUNTER MEDICATION  08/01/2015: Nature's Bounty AM/PM menopausal supplement  . pseudoephedrine (SUDAFED) 30 MG tablet Take 30 mg by mouth every 4 (four) hours as needed for congestion. 04/29/2015: Uses prn; last dose was yesterday afternoon  . [DISCONTINUED] Phentermine-Topiramate (QSYMIA) 3.75-23 MG CP24 Take 1 capsule by mouth daily.   . [DISCONTINUED] Phentermine-Topiramate (QSYMIA) 7.5-46 MG CP24 Take 1 capsule by mouth daily.   . [DISCONTINUED] zolpidem (AMBIEN) 10 MG tablet take 1/2 to 1 tablet by mouth at bedtime  if needed for sleep   . Phentermine-Topiramate 11.25-69 MG CP24 Take 1 capsule by mouth daily.    No facility-administered encounter medications on file as of 09/12/2015.   (was on 7.5-46mg  of Qsymia prior to today's visit)  Allergies  Allergen Reactions  . Codeine Itching and Nausea And Vomiting   ROS:  No fever, chills, discolored mucus.  +muffled hearing on right, some sinus pressure.  No cough, shortness of breath, chest pain, palpitations, nausea, vomiting, bowel changes, bleeding, rash, or other concerns.  PHYSICAL EXAM: BP 110/68 mmHg  Pulse 78  Resp 14  Wt 175 lb 9.6 oz (79.652 kg)  LMP 07/13/2015  Well developed, pleasant female, in no distress. She appears happy, comfortable, pleased with her lack of further weight gain, but somewhat frustrated/irritable re: ongoing right ear symptoms HEENT: PERRL, EOMI, conjunctiva clear.  Left TM and EAC normal.  Blue tube in right TM. EAC is normal, no drainage, erythema or swelling. Nasal mucosa is mildly edematous, with some crusting in the right nares. No erythema or purulence. Sinuses are nontender, OP is clear Neck: no lymphadenopathy or mass Heart: regular rate and rhythm without murmur Lungs: clear bilaterally Skin: normal turgor, no rash Psych--as above Neuro: alert and oriented. Cranial nerves intact, normal strength, gait  ASSESSMENT/PLAN:  Ear pressure, right - reassured no e/o infection. Keep close f/u with Dr. Guido Sander trust him.   Obesity (BMI 30-39.9) - continue diet, exercise. Increase Qsymia to next dose and f/u in 2 months - Plan: Phentermine-Topiramate 11.25-69 MG CP24  Allergic rhinitis, unspecified allergic rhinitis type - continue current meds/treatments   F/u 2 months  25 min visit, more than 1/2 spent counseling (re: ear, allergies, Qsymia, diet, exercise)

## 2015-09-12 NOTE — Patient Instructions (Signed)
Increase up to the next dose of Qsymia.  I wrote for 2 months.  Return in 2 months for follow-up.  We can then decide if we need to further titrate up to maximum dose, depending on your response. Let me know if you don't tolerate this dose--we can always go back to the 7.5 dose that you are doing fine with now. Keep your follow-up appointment next week with Dr. Benjamine Mola.  I see no evidence of infection currently.

## 2015-09-13 ENCOUNTER — Ambulatory Visit (AMBULATORY_SURGERY_CENTER): Payer: Self-pay | Admitting: *Deleted

## 2015-09-13 ENCOUNTER — Ambulatory Visit
Admission: RE | Admit: 2015-09-13 | Discharge: 2015-09-13 | Disposition: A | Discharge status: Home / Self Care | Payer: BC Managed Care – PPO | Source: Ambulatory Visit | Attending: Family Medicine | Admitting: Family Medicine

## 2015-09-13 ENCOUNTER — Other Ambulatory Visit: Payer: Self-pay | Admitting: Family Medicine

## 2015-09-13 VITALS — Ht 64.0 in | Wt 175.0 lb

## 2015-09-13 DIAGNOSIS — R928 Other abnormal and inconclusive findings on diagnostic imaging of breast: Secondary | ICD-10-CM

## 2015-09-13 DIAGNOSIS — Z1211 Encounter for screening for malignant neoplasm of colon: Secondary | ICD-10-CM

## 2015-09-13 MED ORDER — NA SULFATE-K SULFATE-MG SULF 17.5-3.13-1.6 GM/177ML PO SOLN: ORAL | Status: DC

## 2015-09-13 NOTE — Telephone Encounter (Signed)
Is this okay to call in? 

## 2015-09-13 NOTE — Telephone Encounter (Signed)
Ok for #30, no refill 

## 2015-09-13 NOTE — Progress Notes (Signed)
Patient denies any allergies to eggs or soy. Patient has "hard time waking up" with anesthesia. Patient denies any oxygen use at home and does take Phentermine /weight loss medication, she is aware to stop this 10 days prior to exam. Patient declined EMMI education.

## 2015-09-13 NOTE — Telephone Encounter (Signed)
Called in med to pharmacy  

## 2015-09-15 HISTORY — PX: COLONOSCOPY: SHX174

## 2015-09-18 ENCOUNTER — Other Ambulatory Visit (INDEPENDENT_AMBULATORY_CARE_PROVIDER_SITE_OTHER): Payer: Self-pay | Admitting: Otolaryngology

## 2015-09-18 DIAGNOSIS — J329 Chronic sinusitis, unspecified: Secondary | ICD-10-CM

## 2015-09-19 ENCOUNTER — Telehealth: Payer: Self-pay | Admitting: Family Medicine

## 2015-09-19 MED ORDER — BENZONATATE 200 MG PO CAPS: 200.0000 mg | ORAL_CAPSULE | Freq: Three times a day (TID) | ORAL | Status: DC | PRN

## 2015-09-19 NOTE — Telephone Encounter (Signed)
Patient advised.

## 2015-09-19 NOTE — Telephone Encounter (Signed)
Pt called and stated she would like something for cough. I offered her an appt and she declined, stating that she was just seen. Looking at visit I don't see anything related to cough. Pt was again offered an appt. She stated that this is an ongoing issue she has discussed with you and was pretty insistent that I put a note back. Pt uses [facility] on [address] and can be reached at [phone].

## 2015-09-19 NOTE — Telephone Encounter (Signed)
Advise pt tessalon was sent to pharmacy to use prn for cough.  Can use during the day, as shouldn't be sedating.  For stronger cough meds (ie narcotics), needs written rx and an OV due to controlled substance.

## 2015-09-23 ENCOUNTER — Ambulatory Visit
Admission: RE | Admit: 2015-09-23 | Discharge: 2015-09-23 | Disposition: A | Discharge status: Home / Self Care | Payer: BC Managed Care – PPO | Source: Ambulatory Visit | Attending: Otolaryngology | Admitting: Otolaryngology

## 2015-09-23 DIAGNOSIS — J329 Chronic sinusitis, unspecified: Secondary | ICD-10-CM

## 2015-09-24 ENCOUNTER — Telehealth: Payer: Self-pay | Admitting: Gastroenterology

## 2015-09-24 NOTE — Telephone Encounter (Signed)
She is probably safe to proceed but if she if feeling too poorly and wants to reschedule then I understand.  If she plans to proceed with Friday procedure, tell her to call Thursday morning to update on her symptoms.

## 2015-09-24 NOTE — Telephone Encounter (Signed)
Pt aware and will call on Thursday if no better

## 2015-09-24 NOTE — Telephone Encounter (Signed)
patient has procedure on Friday and is sick. she states that she is not running a fever. she says that she has been having congestion, coughing, headaches. she states that she has been taking sudafed,mucinex, nasal spray, tessalon pearls and is wanting to know if she can still have her procedure on Friday.

## 2015-09-24 NOTE — Telephone Encounter (Signed)
Dr Ardis Hughs please advise, pt is a direct colon.

## 2015-09-27 ENCOUNTER — Encounter: Payer: Self-pay | Admitting: Gastroenterology

## 2015-09-27 ENCOUNTER — Ambulatory Visit (AMBULATORY_SURGERY_CENTER): Payer: BC Managed Care – PPO | Admitting: Gastroenterology

## 2015-09-27 VITALS — BP 125/47 | HR 75 | Temp 98.2°F | Resp 20 | Ht 64.0 in | Wt 175.0 lb

## 2015-09-27 DIAGNOSIS — Z1211 Encounter for screening for malignant neoplasm of colon: Secondary | ICD-10-CM | POA: Diagnosis not present

## 2015-09-27 MED ORDER — SODIUM CHLORIDE 0.9 % IV SOLN: 500.0000 mL | INTRAVENOUS | Status: DC

## 2015-09-27 NOTE — Progress Notes (Signed)
Report to PACU, RN, vss, BBS= Clear.  

## 2015-09-27 NOTE — Patient Instructions (Signed)
YOU HAD AN ENDOSCOPIC PROCEDURE TODAY AT THE [facility]:   Refer to the procedure report that was given to you for any specific questions about what was found during the examination.  If the procedure report does not answer your questions, please call your gastroenterologist to clarify.  If you requested that your care partner not be given the details of your procedure findings, then the procedure report has been included in a sealed envelope for you to review at your convenience later.  YOU SHOULD EXPECT: Some feelings of bloating in the abdomen. Passage of more gas than usual.  Walking can help get rid of the air that was put into your GI tract during the procedure and reduce the bloating. If you had a lower endoscopy (such as a colonoscopy or flexible sigmoidoscopy) you may notice spotting of blood in your stool or on the toilet paper. If you underwent a bowel prep for your procedure, you may not have a normal bowel movement for a few days.  Please Note:  You might notice some irritation and congestion in your nose or some drainage.  This is from the oxygen used during your procedure.  There is no need for concern and it should clear up in a day or so.  SYMPTOMS TO REPORT IMMEDIATELY:   Following lower endoscopy (colonoscopy or flexible sigmoidoscopy):  Excessive amounts of blood in the stool  Significant tenderness or worsening of abdominal pains  Swelling of the abdomen that is new, acute  Fever of 100F or higher   Following upper endoscopy (EGD)  Vomiting of blood or coffee ground material  New chest pain or pain under the shoulder blades  Painful or persistently difficult swallowing  New shortness of breath  Fever of 100F or higher  Black, tarry-looking stools  For urgent or emergent issues, a gastroenterologist can be reached at any hour by calling [phone].   DIET: Your first meal following the procedure should be a small meal and then it is ok to progress to  your normal diet. Heavy or fried foods are harder to digest and may make you feel nauseous or bloated.  Likewise, meals heavy in dairy and vegetables can increase bloating.  Drink plenty of fluids but you should avoid alcoholic beverages for 24 hours.  ACTIVITY:  You should plan to take it easy for the rest of today and you should NOT DRIVE or use heavy machinery until tomorrow (because of the sedation medicines used during the test).    FOLLOW UP: Our staff will call the number listed on your records the next business day following your procedure to check on you and address any questions or concerns that you may have regarding the information given to you following your procedure. If we do not reach you, we will leave a message.  However, if you are feeling well and you are not experiencing any problems, there is no need to return our call.  We will assume that you have returned to your regular daily activities without incident.  If any biopsies were taken you will be contacted by phone or by letter within the next 1-3 weeks.  Please call us at [phone] if you have not heard about the biopsies in 3 weeks.    SIGNATURES/CONFIDENTIALITY: You and/or your care partner have signed paperwork which will be entered into your electronic medical record.  These signatures attest to the fact that that the information above on your After Visit Summary has been reviewed   and is understood.  Full responsibility of the confidentiality of this discharge information lies with you and/or your care-partner.YOU HAD AN ENDOSCOPIC PROCEDURE TODAY AT [facility]:   Refer to the procedure report that was given to you for any specific questions about what was found during the examination.  If the procedure report does not answer your questions, please call your gastroenterologist to clarify.  If you requested that your care partner not be given the details of your procedure findings, then the procedure  report has been included in a sealed envelope for you to review at your convenience later.  YOU SHOULD EXPECT: Some feelings of bloating in the abdomen. Passage of more gas than usual.  Walking can help get rid of the air that was put into your GI tract during the procedure and reduce the bloating. If you had a lower endoscopy (such as a colonoscopy or flexible sigmoidoscopy) you may notice spotting of blood in your stool or on the toilet paper. If you underwent a bowel prep for your procedure, you may not have a normal bowel movement for a few days.  Please Note:  You might notice some irritation and congestion in your nose or some drainage.  This is from the oxygen used during your procedure.  There is no need for concern and it should clear up in a day or so.  SYMPTOMS TO REPORT IMMEDIATELY:   Following lower endoscopy (colonoscopy or flexible sigmoidoscopy):  Excessive amounts of blood in the stool  Significant tenderness or worsening of abdominal pains  Swelling of the abdomen that is new, acute  Fever of 100F or higher  For urgent or emergent issues, a gastroenterologist can be reached at any hour by calling [phone].   DIET: Your first meal following the procedure should be a small meal and then it is ok to progress to your normal diet. Heavy or fried foods are harder to digest and may make you feel nauseous or bloated.  Likewise, meals heavy in dairy and vegetables can increase bloating.  Drink plenty of fluids but you should avoid alcoholic beverages for 24 hours.  ACTIVITY:  You should plan to take it easy for the rest of today and you should NOT DRIVE or use heavy machinery until tomorrow (because of the sedation medicines used during the test).    FOLLOW UP: Our staff will call the number listed on your records the next business day following your procedure to check on you and address any questions or concerns that you may have regarding the information given to you  following your procedure. If we do not reach you, we will leave a message.  However, if you are feeling well and you are not experiencing any problems, there is no need to return our call.  We will assume that you have returned to your regular daily activities without incident.  If any biopsies were taken you will be contacted by phone or by letter within the next 1-3 weeks.  Please call us at [phone] if you have not heard about the biopsies in 3 weeks.    SIGNATURES/CONFIDENTIALITY: You and/or your care partner have signed paperwork which will be entered into your electronic medical record.  These signatures attest to the fact that that the information above on your After Visit Summary has been reviewed and is understood.  Full responsibility of the confidentiality of this discharge information lies with you and/or your care-partner.  Next colonoscopy in 10 years.

## 2015-09-27 NOTE — Op Note (Signed)
[place]  [address] [place], [address]   COLONOSCOPY PROCEDURE REPORT  PATIENT: Brandi Carpenter, Brandi Carpenter  MR#: [id number] BIRTHDATE: 12-08-1965 , 49  yrs. old GENDER: female ENDOSCOPIST: Milus Banister, MD REFERRED Terrall Laity, M.D. PROCEDURE DATE:  09/27/2015 PROCEDURE:   Colonoscopy, screening First Screening Colonoscopy - Avg.  risk and is 50 yrs.  old or older Yes.  Prior Negative Screening - Now for repeat screening. N/A  History of Adenoma - Now for follow-up colonoscopy & has been > or = to 3 yrs.  N/A  Recommend repeat exam, <10 yrs? No ASA CLASS:   Class II INDICATIONS:Screening for colonic neoplasia and Colorectal Neoplasm Risk Assessment for this procedure is average risk. MEDICATIONS: Monitored anesthesia care and Propofol 300 mg IV  DESCRIPTION OF PROCEDURE:   After the risks benefits and alternatives of the procedure were thoroughly explained, informed consent was obtained.  The digital rectal exam revealed no abnormalities of the rectum.   The [id number]  endoscope was introduced through the anus and advanced to the cecum, which was identified by both the appendix and ileocecal valve. No adverse events experienced.   The quality of the prep was excellent.  The instrument was then slowly withdrawn as the colon was fully examined. Estimated blood loss is zero unless otherwise noted in this procedure report.  COLON FINDINGS: A normal appearing cecum, ileocecal valve, and appendiceal orifice were identified.  The ascending, transverse, descending, sigmoid colon, and rectum appeared unremarkable. Retroflexed views revealed no abnormalities. The time to cecum = 4.8 Withdrawal time = 8.2   The scope was withdrawn and the procedure completed. COMPLICATIONS: There were no immediate complications.  ENDOSCOPIC IMPRESSION: Normal colonoscopy No polyps or cancers  RECOMMENDATIONS: You should continue to follow colorectal cancer screening  guidelines for "routine risk" patients with a repeat colonoscopy in 10 years.   eSigned:  Milus Banister, MD 09/27/2015 9:32 AM

## 2015-09-30 ENCOUNTER — Telehealth: Payer: Self-pay

## 2015-09-30 NOTE — Telephone Encounter (Signed)
  Follow up Call-  Call back number 09/27/2015  Post procedure Call Back phone  # [phone]  Permission to leave phone message Yes     Patient questions:  Do you have a fever, pain , or abdominal swelling? No. Pain Score  0 *  Have you tolerated food without any problems? Yes.    Have you been able to return to your normal activities? Yes.    Do you have any questions about your discharge instructions: Diet   No. Medications  No. Follow up visit  No.  Do you have questions or concerns about your Care? No.  Actions: * If pain score is 4 or above: No action needed, pain <4.

## 2015-10-02 ENCOUNTER — Other Ambulatory Visit: Payer: Self-pay | Admitting: Otolaryngology

## 2015-10-02 ENCOUNTER — Encounter (HOSPITAL_BASED_OUTPATIENT_CLINIC_OR_DEPARTMENT_OTHER): Payer: Self-pay | Admitting: *Deleted

## 2015-10-07 ENCOUNTER — Ambulatory Visit (HOSPITAL_BASED_OUTPATIENT_CLINIC_OR_DEPARTMENT_OTHER): Payer: BC Managed Care – PPO | Admitting: Anesthesiology

## 2015-10-07 ENCOUNTER — Ambulatory Visit (HOSPITAL_BASED_OUTPATIENT_CLINIC_OR_DEPARTMENT_OTHER)
Admission: RE | Admit: 2015-10-07 | Discharge: 2015-10-07 | Disposition: A | Discharge status: Home / Self Care | Payer: BC Managed Care – PPO | Source: Ambulatory Visit | Attending: Otolaryngology | Admitting: Otolaryngology

## 2015-10-07 ENCOUNTER — Encounter (HOSPITAL_BASED_OUTPATIENT_CLINIC_OR_DEPARTMENT_OTHER): Payer: Self-pay | Admitting: Anesthesiology

## 2015-10-07 ENCOUNTER — Encounter (HOSPITAL_BASED_OUTPATIENT_CLINIC_OR_DEPARTMENT_OTHER)
Admission: RE | Disposition: A | Discharge status: Home / Self Care | Payer: Self-pay | Source: Ambulatory Visit | Attending: Otolaryngology

## 2015-10-07 DIAGNOSIS — J343 Hypertrophy of nasal turbinates: Secondary | ICD-10-CM | POA: Diagnosis not present

## 2015-10-07 DIAGNOSIS — J342 Deviated nasal septum: Secondary | ICD-10-CM | POA: Insufficient documentation

## 2015-10-07 DIAGNOSIS — Z79899 Other long term (current) drug therapy: Secondary | ICD-10-CM | POA: Diagnosis not present

## 2015-10-07 DIAGNOSIS — F418 Other specified anxiety disorders: Secondary | ICD-10-CM | POA: Insufficient documentation

## 2015-10-07 DIAGNOSIS — J3489 Other specified disorders of nose and nasal sinuses: Secondary | ICD-10-CM | POA: Diagnosis not present

## 2015-10-07 HISTORY — DX: Headache: R51

## 2015-10-07 HISTORY — DX: Headache, unspecified: R51.9

## 2015-10-07 HISTORY — PX: NASAL SEPTOPLASTY W/ TURBINOPLASTY: SHX2070

## 2015-10-07 SURGERY — SEPTOPLASTY, NOSE, WITH NASAL TURBINATE REDUCTION
Anesthesia: General | Site: Nose | Laterality: Bilateral

## 2015-10-07 MED ORDER — PROMETHAZINE HCL 25 MG/ML IJ SOLN: 6.2500 mg | INTRAMUSCULAR | Status: DC | PRN

## 2015-10-07 MED ORDER — FENTANYL CITRATE (PF) 100 MCG/2ML IJ SOLN
50.0000 ug | INTRAMUSCULAR | Status: DC | PRN
  Administered 2015-10-07: 100 ug via INTRAVENOUS

## 2015-10-07 MED ORDER — SUCCINYLCHOLINE CHLORIDE 20 MG/ML IJ SOLN
INTRAMUSCULAR | Status: DC | PRN
  Administered 2015-10-07: 100 mg via INTRAVENOUS

## 2015-10-07 MED ORDER — CEFAZOLIN SODIUM-DEXTROSE 2-3 GM-% IV SOLR
INTRAVENOUS | Status: AC
  Filled 2015-10-07: qty 50

## 2015-10-07 MED ORDER — LIDOCAINE-EPINEPHRINE 1 %-1:100000 IJ SOLN
INTRAMUSCULAR | Status: DC | PRN
Start: 2015-10-07 — End: 2015-10-07
  Administered 2015-10-07: 3 mL

## 2015-10-07 MED ORDER — AMOXICILLIN 875 MG PO TABS: 875.0000 mg | ORAL_TABLET | Freq: Two times a day (BID) | ORAL | Status: DC

## 2015-10-07 MED ORDER — HYDROMORPHONE HCL 1 MG/ML IJ SOLN
INTRAMUSCULAR | Status: AC
  Filled 2015-10-07: qty 1

## 2015-10-07 MED ORDER — OXYCODONE-ACETAMINOPHEN 5-325 MG PO TABS: 1.0000 | ORAL_TABLET | ORAL | Status: DC | PRN

## 2015-10-07 MED ORDER — SCOPOLAMINE 1 MG/3DAYS TD PT72: 1.0000 | MEDICATED_PATCH | Freq: Once | TRANSDERMAL | Status: DC | PRN

## 2015-10-07 MED ORDER — BACITRACIN ZINC 500 UNIT/GM EX OINT
TOPICAL_OINTMENT | CUTANEOUS | Status: AC
  Filled 2015-10-07: qty 28.35

## 2015-10-07 MED ORDER — LACTATED RINGERS IV SOLN
INTRAVENOUS | Status: DC
  Administered 2015-10-07 (×2): via INTRAVENOUS

## 2015-10-07 MED ORDER — FENTANYL CITRATE (PF) 100 MCG/2ML IJ SOLN
INTRAMUSCULAR | Status: AC
  Filled 2015-10-07: qty 2

## 2015-10-07 MED ORDER — ONDANSETRON HCL 4 MG/2ML IJ SOLN
INTRAMUSCULAR | Status: AC
  Filled 2015-10-07: qty 2

## 2015-10-07 MED ORDER — LIDOCAINE-EPINEPHRINE 1 %-1:100000 IJ SOLN
INTRAMUSCULAR | Status: AC
  Filled 2015-10-07: qty 1

## 2015-10-07 MED ORDER — MIDAZOLAM HCL 2 MG/2ML IJ SOLN
INTRAMUSCULAR | Status: AC
  Filled 2015-10-07: qty 2

## 2015-10-07 MED ORDER — HYDROCODONE-ACETAMINOPHEN 7.5-325 MG PO TABS: 1.0000 | ORAL_TABLET | Freq: Once | ORAL | Status: DC | PRN

## 2015-10-07 MED ORDER — MIDAZOLAM HCL 2 MG/2ML IJ SOLN
1.0000 mg | INTRAMUSCULAR | Status: DC | PRN
  Administered 2015-10-07: 2 mg via INTRAVENOUS

## 2015-10-07 MED ORDER — PROPOFOL 10 MG/ML IV BOLUS
INTRAVENOUS | Status: DC | PRN
  Administered 2015-10-07: 200 mg via INTRAVENOUS

## 2015-10-07 MED ORDER — ONDANSETRON HCL 4 MG/2ML IJ SOLN
INTRAMUSCULAR | Status: DC | PRN
  Administered 2015-10-07: 4 mg via INTRAVENOUS

## 2015-10-07 MED ORDER — DEXAMETHASONE SODIUM PHOSPHATE 4 MG/ML IJ SOLN
INTRAMUSCULAR | Status: DC | PRN
  Administered 2015-10-07: 10 mg via INTRAVENOUS

## 2015-10-07 MED ORDER — LIDOCAINE HCL (CARDIAC) 20 MG/ML IV SOLN
INTRAVENOUS | Status: AC
  Filled 2015-10-07: qty 5

## 2015-10-07 MED ORDER — PHENYLEPHRINE HCL 10 MG/ML IJ SOLN
INTRAMUSCULAR | Status: DC | PRN
  Administered 2015-10-07: 40 ug via INTRAVENOUS

## 2015-10-07 MED ORDER — OXYMETAZOLINE HCL 0.05 % NA SOLN
NASAL | Status: DC | PRN
  Administered 2015-10-07: 1 via TOPICAL

## 2015-10-07 MED ORDER — PHENYLEPHRINE 40 MCG/ML (10ML) SYRINGE FOR IV PUSH (FOR BLOOD PRESSURE SUPPORT)
PREFILLED_SYRINGE | INTRAVENOUS | Status: AC
  Filled 2015-10-07: qty 10

## 2015-10-07 MED ORDER — DEXAMETHASONE SODIUM PHOSPHATE 10 MG/ML IJ SOLN
INTRAMUSCULAR | Status: AC
  Filled 2015-10-07: qty 1

## 2015-10-07 MED ORDER — MUPIROCIN 2 % EX OINT
TOPICAL_OINTMENT | CUTANEOUS | Status: DC | PRN
  Administered 2015-10-07: 1 via NASAL

## 2015-10-07 MED ORDER — LIDOCAINE HCL (CARDIAC) 20 MG/ML IV SOLN
INTRAVENOUS | Status: DC | PRN
  Administered 2015-10-07: 100 mg via INTRAVENOUS

## 2015-10-07 MED ORDER — HYDROMORPHONE HCL 1 MG/ML IJ SOLN
0.2500 mg | INTRAMUSCULAR | Status: DC | PRN
  Administered 2015-10-07: 0.25 mg via INTRAVENOUS
  Administered 2015-10-07 (×2): 0.5 mg via INTRAVENOUS
  Administered 2015-10-07: 0.25 mg via INTRAVENOUS

## 2015-10-07 MED ORDER — GLYCOPYRROLATE 0.2 MG/ML IJ SOLN: 0.2000 mg | Freq: Once | INTRAMUSCULAR | Status: DC | PRN

## 2015-10-07 MED ORDER — OXYMETAZOLINE HCL 0.05 % NA SOLN
NASAL | Status: AC
  Filled 2015-10-07: qty 15

## 2015-10-07 MED ORDER — CEFAZOLIN SODIUM-DEXTROSE 2-3 GM-% IV SOLR
INTRAVENOUS | Status: DC | PRN
  Administered 2015-10-07: 2 g via INTRAVENOUS

## 2015-10-07 SURGICAL SUPPLY — 34 items
ATTRACTOMAT 16X20 MAGNETIC DRP (DRAPES) IMPLANT
CANISTER SUCT 1200ML W/VALVE (MISCELLANEOUS) ×3 IMPLANT
COAGULATOR SUCT 8FR VV (MISCELLANEOUS) ×3 IMPLANT
DECANTER SPIKE VIAL GLASS SM (MISCELLANEOUS) IMPLANT
DRSG NASOPORE 8CM (GAUZE/BANDAGES/DRESSINGS) IMPLANT
DRSG TELFA 3X8 NADH (GAUZE/BANDAGES/DRESSINGS) IMPLANT
ELECT REM PT RETURN 9FT ADLT (ELECTROSURGICAL) ×3
ELECTRODE REM PT RTRN 9FT ADLT (ELECTROSURGICAL) ×1 IMPLANT
GLOVE BIO SURGEON STRL SZ7.5 (GLOVE) ×3 IMPLANT
GLOVE SURG SS PI 7.0 STRL IVOR (GLOVE) ×2 IMPLANT
GOWN STRL REUS W/ TWL LRG LVL3 (GOWN DISPOSABLE) ×2 IMPLANT
GOWN STRL REUS W/TWL LRG LVL3 (GOWN DISPOSABLE) ×6
NDL HYPO 25X1 1.5 SAFETY (NEEDLE) ×1 IMPLANT
NEEDLE HYPO 25X1 1.5 SAFETY (NEEDLE) ×3 IMPLANT
NS IRRIG 1000ML POUR BTL (IV SOLUTION) ×3 IMPLANT
PACK BASIN DAY SURGERY FS (CUSTOM PROCEDURE TRAY) ×3 IMPLANT
PACK ENT DAY SURGERY (CUSTOM PROCEDURE TRAY) ×3 IMPLANT
PAD DRESSING TELFA 3X8 NADH (GAUZE/BANDAGES/DRESSINGS) IMPLANT
SLEEVE SCD COMPRESS KNEE MED (MISCELLANEOUS) ×2 IMPLANT
SOLUTION BUTLER CLEAR DIP (MISCELLANEOUS) ×3 IMPLANT
SPLINT NASAL AIRWAY SILICONE (MISCELLANEOUS) ×2 IMPLANT
SPONGE GAUZE 2X2 8PLY STER LF (GAUZE/BANDAGES/DRESSINGS) ×1
SPONGE GAUZE 2X2 8PLY STRL LF (GAUZE/BANDAGES/DRESSINGS) ×2 IMPLANT
SPONGE NEURO XRAY DETECT 1X3 (DISPOSABLE) ×3 IMPLANT
SUT BONE WAX W31G (SUTURE) ×2 IMPLANT
SUT CHROMIC 4 0 P 3 18 (SUTURE) ×3 IMPLANT
SUT PLAIN 4 0 ~~LOC~~ 1 (SUTURE) ×3 IMPLANT
SUT PROLENE 3 0 PS 2 (SUTURE) ×3 IMPLANT
SUT VIC AB 4-0 P-3 18XBRD (SUTURE) IMPLANT
SUT VIC AB 4-0 P3 18 (SUTURE)
TOWEL OR 17X24 6PK STRL BLUE (TOWEL DISPOSABLE) ×3 IMPLANT
TUBE SALEM SUMP 12R W/ARV (TUBING) IMPLANT
TUBE SALEM SUMP 16 FR W/ARV (TUBING) ×3 IMPLANT
YANKAUER SUCT BULB TIP NO VENT (SUCTIONS) ×3 IMPLANT

## 2015-10-07 NOTE — Op Note (Signed)
DATE OF PROCEDURE: 10/07/2015  OPERATIVE REPORT   SURGEON: Leta Baptist, MD   PREOPERATIVE DIAGNOSES:  1. Severe nasal septal deviation.  2. Bilateral inferior turbinate hypertrophy.  3. Chronic nasal obstruction.  POSTOPERATIVE DIAGNOSES:  1. Severe nasal septal deviation.  2. Bilateral inferior turbinate hypertrophy.  3. Chronic nasal obstruction.  PROCEDURE PERFORMED:  1. Septoplasty.  2. Bilateral partial inferior turbinate resection.   ANESTHESIA: General endotracheal tube anesthesia.   COMPLICATIONS: None.   ESTIMATED BLOOD LOSS: Less than 100 mL.   INDICATION FOR PROCEDURE: Brandi Carpenter is a 50 y.o. female with a history of chronic nasal obstruction. The patient was  treated with weekly allergy shots, antihistamine, decongestant, steroid nasal spray, and systemic steroids. However, the patient continues to be symptomatic. On examination, the patient was noted to have bilateral severe inferior turbinate hypertrophy and significant nasal septal deviation, causing significant nasal obstruction. Based on the above findings, the decision was made for the patient to undergo the above-stated procedure. The risks, benefits, alternatives, and details of the procedure were discussed with the patient. Questions were invited and answered. Informed consent was obtained.   DESCRIPTION OF PROCEDURE: The patient was taken to the operating room and placed supine on the operating table. General endotracheal tube anesthesia was administered by the anesthesiologist. The patient was positioned, and prepped and draped in the standard fashion for nasal surgery. Pledgets soaked with Afrin were placed in both nasal cavities for decongestion. The pledgets were subsequently removed. The above mentioned severe septal deviation was again noted. 1% lidocaine with 1:100,000 epinephrine was injected onto the nasal septum bilaterally. A hemitransfixion incision was made on the left side. The mucosal flap was  carefully elevated on the left side. A cartilaginous incision was made 1 cm superior to the caudal margin of the nasal septum. Mucosal flap was also elevated on the right side in the similar fashion. It should be noted that due to the severe septal deviation, the deviated portion of the cartilaginous and bony septum had to be removed in piecemeal fashion. Once the deviated portions were removed, a straight midline septum was achieved. The septum was then quilted with 4-0 plain gut sutures. The hemitransfixion incision was closed with interrupted 4-0 chromic sutures. Doyle splints were applied.   Prior to the [facility] [place] splint application, the inferior one half of both hypertrophied inferior turbinate was crossclamped with a Kelly clamp. The inferior one half of each inferior turbinate was then resected with a pair of cross cutting scissors. Hemostasis was achieved with a suction cautery device.   The care of the patient was turned over to the anesthesiologist. The patient was awakened from anesthesia without difficulty. The patient was extubated and transferred to the recovery room in good condition.   OPERATIVE FINDINGS: Severe nasal septal deviation and bilateral inferior turbinate hypertrophy.   SPECIMEN: None.   FOLLOWUP CARE: The patient be discharged home once she is awake and alert. The patient will be placed on Percocet 1-2 tablets p.o. q.6 hours p.r.n. pain, and amoxicillin 875 mg p.o. b.i.d. for 5 days. The patient will follow up in my office in approximately 1 week for splint removal.   Tiersa Dayley Raynelle Bring, MD

## 2015-10-07 NOTE — Anesthesia Preprocedure Evaluation (Addendum)
Anesthesia Evaluation  Patient identified by MRN, date of birth, ID band Patient awake    Reviewed: Allergy & Precautions, NPO status , Patient's Chart, lab work & pertinent test results  Airway Mallampati: II  TM Distance: >3 FB Neck ROM: Full    Dental  (+) Dental Advisory Given   Pulmonary neg pulmonary ROS,    breath sounds clear to auscultation       Cardiovascular negative cardio ROS   Rhythm:Regular Rate:Normal     Neuro/Psych  Headaches, Anxiety Depression    GI/Hepatic negative GI ROS, Neg liver ROS,   Endo/Other  negative endocrine ROS  Renal/GU negative Renal ROS     Musculoskeletal   Abdominal   Peds  Hematology negative hematology ROS (+)   Anesthesia Other Findings   Reproductive/Obstetrics                            Anesthesia Physical Anesthesia Plan  ASA: II  Anesthesia Plan: General   Post-op Pain Management:    Induction: Intravenous  Airway Management Planned: Oral ETT  Additional Equipment:   Intra-op Plan:   Post-operative Plan: Extubation in OR  Informed Consent: I have reviewed the patients History and Physical, chart, labs and discussed the procedure including the risks, benefits and alternatives for the proposed anesthesia with the patient or authorized representative who has indicated his/her understanding and acceptance.   Dental advisory given  Plan Discussed with: CRNA  Anesthesia Plan Comments:         Anesthesia Quick Evaluation

## 2015-10-07 NOTE — Discharge Instructions (Addendum)

## 2015-10-07 NOTE — H&P (Signed)
Cc: Chronic nasal obstruction  HPI: The patient is a 50 year old female who returns today for her follow-up evaluation.  The patient was last seen 3 weeks ago.  At that time, she was noted to have significant right septal deviation and bilateral inferior turbinate hypertrophy.  The patient had persistent nasal congestion and facial pressure.  She was treated with Flonase nasal spray and Patanase daily.  She was also instructed to perform daily nasal saline irrigation. The patient also has history of eustachian tube dysfunction status post right myringotomy and tube placement. She subsequently underwent a paranasal sinus CT scan.  The CT showed no significant sinus disease.  However, the nasal septal deviation and turbinate hypertrophy were noted on the CT scan. The patient returns complaining of persistent nasal obstruction. She also reports a significant amount of nasal crusting in her cavities.   Exam: The nasal cavities were decongested and anesthetised with a combination of oxymetazoline and 4% lidocaine solution.  The flexible scope was inserted into the right nasal cavity. NSD with crusting was noted.  Endoscopy of the inferior and middle meatus was performed.  The edematous mucosa was as described above.  No polyp, mass, or lesion was appreciated.  Olfactory cleft was clear.  Nasopharynx was clear.  Turbinates were hypertrophied but without mass.  Incomplete response to decongestion.  The procedure was repeated on the contralateral side with similar findings.  The patient tolerated the procedure well.  Instructions were given to avoid eating or drinking for 2 hours.   Assessment: 1.  Severe rightward nasal septal deviation and bilateral inferior turbinate hypertrophy.  2.  No purulent drainage, polyps, mass or lesion is noted.   3.  Eustachian tube dysfunction, status post right myringotomy and tube placement.   Plan: 1.  The nasal endoscopy and CT images are extensively reviewed with the  patient.  2.  The patient should continue with her current allergy treatment regimen.  3.  The option of septoplasty and turbinate reduction to improve her nasal airway is discussed.  The risks, benefits, alternatives and details of the procedure are reviewed with the patient.  4.  The patient would like to proceed with the procedure.

## 2015-10-07 NOTE — Transfer of Care (Signed)
Immediate Anesthesia Transfer of Care Note  Patient: Brandi Carpenter  Procedure(s) Performed: Procedure(s): NASAL SEPTOPLASTY WITH BILATERAL TURBINATE REDUCTION (Bilateral)  Patient Location: PACU  Anesthesia Type:General  Level of Consciousness: awake  Airway & Oxygen Therapy: Patient Spontanous Breathing and Patient connected to face mask oxygen  Post-op Assessment: Report given to RN and Post -op Vital signs reviewed and stable  Post vital signs: Reviewed and stable  Last Vitals:  Filed Vitals:   10/07/15 0851  BP: 148/79  Pulse: 81  Temp: 37 C  Resp: 20    Complications: No apparent anesthesia complications

## 2015-10-07 NOTE — Anesthesia Postprocedure Evaluation (Signed)
Anesthesia Post Note  Patient: Brandi Carpenter  Procedure(s) Performed: Procedure(s) (LRB): NASAL SEPTOPLASTY WITH BILATERAL TURBINATE REDUCTION (Bilateral)  Patient location during evaluation: PACU Anesthesia Type: General Level of consciousness: awake and alert Pain management: pain level controlled Vital Signs Assessment: post-procedure vital signs reviewed and stable Respiratory status: spontaneous breathing, nonlabored ventilation and respiratory function stable Cardiovascular status: blood pressure returned to baseline and stable Postop Assessment: no signs of nausea or vomiting Anesthetic complications: no    Last Vitals:  Filed Vitals:   10/07/15 1230 10/07/15 1245  BP: 154/99 137/90  Pulse: 76 87  Temp:    Resp: 11 14    Last Pain:  Filed Vitals:   10/07/15 1252  PainSc: 3                  Margaruite Top A

## 2015-10-07 NOTE — Anesthesia Procedure Notes (Signed)
Procedure Name: Intubation Date/Time: 10/07/2015 10:00 AM Performed by: Lieutenant Diego Pre-anesthesia Checklist: Patient identified, Emergency Drugs available, Suction available and Patient being monitored Patient Re-evaluated:Patient Re-evaluated prior to inductionOxygen Delivery Method: Circle System Utilized Preoxygenation: Pre-oxygenation with 100% oxygen Intubation Type: IV induction Ventilation: Mask ventilation without difficulty Laryngoscope Size: Miller and 2 Grade View: Grade I Tube type: Oral Tube size: 7.0 mm Number of attempts: 1 Airway Equipment and Method: Stylet and Oral airway Placement Confirmation: ETT inserted through vocal cords under direct vision,  positive ETCO2 and breath sounds checked- equal and bilateral Secured at: 22 cm Tube secured with: Tape Dental Injury: Teeth and Oropharynx as per pre-operative assessment

## 2015-10-08 ENCOUNTER — Encounter (HOSPITAL_BASED_OUTPATIENT_CLINIC_OR_DEPARTMENT_OTHER): Payer: Self-pay | Admitting: Otolaryngology

## 2015-10-09 ENCOUNTER — Ambulatory Visit: Payer: BC Managed Care – PPO | Admitting: Neurology

## 2015-10-10 ENCOUNTER — Telehealth: Payer: Self-pay | Admitting: Neurology

## 2015-10-10 DIAGNOSIS — G43709 Chronic migraine without aura, not intractable, without status migrainosus: Secondary | ICD-10-CM

## 2015-10-10 NOTE — Telephone Encounter (Signed)
Nicole/[facility] [phone] called to request BOTOX Rx

## 2015-10-11 MED ORDER — ONABOTULINUMTOXINA 100 UNITS IJ SOLR: INTRAMUSCULAR | Status: DC

## 2015-10-11 NOTE — Telephone Encounter (Signed)
Brandi Carpenter could you get me a written Rx. To fax over?

## 2015-10-11 NOTE — Telephone Encounter (Signed)
Printed Rx, waiting for Dr [employer]. I will bring to you when signed! Thanks!

## 2015-10-11 NOTE — Telephone Encounter (Signed)
Gave printed/signed rx botox to Danielle.

## 2015-10-18 ENCOUNTER — Other Ambulatory Visit: Payer: Self-pay | Admitting: *Deleted

## 2015-10-18 ENCOUNTER — Telehealth: Payer: Self-pay | Admitting: Family Medicine

## 2015-10-18 MED ORDER — ZOLPIDEM TARTRATE 10 MG PO TABS: ORAL_TABLET | ORAL | Status: DC

## 2015-10-18 NOTE — Telephone Encounter (Signed)
Rcvd refill request for Zolpidem Tartrate 10mg  and Fluticasone 49mcg #16

## 2015-10-18 NOTE — Telephone Encounter (Signed)
Called the patient and left her a VM asking for a call back to r/s her canceled apt.

## 2015-10-18 NOTE — Telephone Encounter (Signed)
[place] for #30 of zolpidem, no refill. Looks like Dr. Benjamine Mola (her ENT) canceled her Flonase--not sure if something else was substituted or not.  Verify with patient.  If on no other nasal steroid, okay to refill flonase x 6 mos.

## 2015-10-31 ENCOUNTER — Encounter: Payer: Self-pay | Admitting: *Deleted

## 2015-10-31 NOTE — Progress Notes (Signed)
Received notice of approval from [facility] for botox. Botox approved from 10/23/2015-04/21/2016.

## 2015-11-11 ENCOUNTER — Encounter: Payer: Self-pay | Admitting: Family Medicine

## 2015-11-11 ENCOUNTER — Ambulatory Visit (INDEPENDENT_AMBULATORY_CARE_PROVIDER_SITE_OTHER): Payer: BC Managed Care – PPO | Admitting: Family Medicine

## 2015-11-11 VITALS — BP 128/78 | HR 68 | Ht 64.0 in | Wt 174.0 lb

## 2015-11-11 DIAGNOSIS — G43009 Migraine without aura, not intractable, without status migrainosus: Secondary | ICD-10-CM | POA: Diagnosis not present

## 2015-11-11 DIAGNOSIS — E669 Obesity, unspecified: Secondary | ICD-10-CM | POA: Diagnosis not present

## 2015-11-11 MED ORDER — PHENTERMINE-TOPIRAMATE ER 11.25-69 MG PO CP24: 1.0000 | ORAL_CAPSULE | Freq: Every day | ORAL | Status: DC

## 2015-11-11 NOTE — Patient Instructions (Signed)
Continue the current dose of Qsymia. At any point, you can call to try the next higher dose (maximum dose 15/92).  I would recommend doing this for better headache control or improved appetite suppressant/weight loss, if you aren't seeing the desired effect of the current dose when taking it regularly for the next 1-2 months.  Resume a regular exercise regimen as well.

## 2015-11-11 NOTE — Progress Notes (Signed)
Chief Complaint  Patient presents with  . Follow-up    2 month follow up on weight and HA's. Did have colonoscopy and nasal surgery since last visit so was off of meds for about a month. Started back on Qsymia 10/14/15.    Patient presents to follow up on her weight loss with Qsymia.  Since her last visit she had colonoscopy and sinus surgery, so was off the medication for quite a bit (10d prior to colonoscopy, then prior to surgery).  She is glad she didn't gain any weight.  She has been back on it regularly for the last month. She is hoping to be able to exercise more in the spring, stay on med consistently, and start to see weight loss.  She wasn't supposed to exercise for 3 weeks, hasn't really resumed anything other than yardwork.  Botox was approved (per [facility]), but pt states it doesn't pay anything, over $1000--still working on getting assistance to see if it will be affordable. Headaches seem to be doing okay, a little better since surgery.  Still has some gum/teeth pain.  PMH, PSH ,SH reviewed.  Outpatient Encounter Prescriptions as of 11/11/2015  Medication Sig Note  . ALPRAZolam (XANAX) 0.5 MG tablet Take 0.5-1 tablets (0.25-0.5 mg total) by mouth 3 (three) times daily as needed for anxiety.   Marland Kitchen buPROPion (WELLBUTRIN XL) 300 MG 24 hr tablet take 1 tablet by mouth every morning   . Cholecalciferol (VITAMIN D) 2000 UNITS tablet Take 2,000 Units by mouth daily.   . citalopram (CELEXA) 20 MG tablet take 1 tablet by mouth once daily   . fluticasone (FLONASE) 50 MCG/ACT nasal spray INSTILL 2 SPRAYS INTO EACH NOSTRIL ONCE DAILY 11/11/2015: Received from: External Pharmacy  . levocetirizine (XYZAL) 5 MG tablet take 1 tablet by mouth every evening   . Multiple Vitamins-Minerals (MULTIVITAMIN PO) Take 1 tablet by mouth daily.   Marland Kitchen OVER THE COUNTER MEDICATION  08/01/2015: Nature's Bounty AM/PM menopausal supplement  . Phentermine-Topiramate 11.25-69 MG CP24 Take 1 capsule by mouth daily.    Marland Kitchen zolpidem (AMBIEN) 10 MG tablet take 1/2 to 1 tablet by mouth at bedtime if needed for sleep   . [DISCONTINUED] Phentermine-Topiramate 11.25-69 MG CP24 Take 1 capsule by mouth daily.   . botulinum toxin Type A (BOTOX) 100 units SOLR injection To be administered by provider in office to head and neck muscles every 3 months for migraines. (Patient not taking: Reported on 11/11/2015)   . EPIPEN 2-PAK 0.3 MG/0.3ML SOAJ injection Reported on 11/11/2015 05/13/2015: Received from: External Pharmacy  . olopatadine (PATANOL) 0.1 % ophthalmic solution Place 1 drop into both eyes 2 (two) times daily. (Patient not taking: Reported on 11/11/2015)   . Olopatadine HCl (PATANASE) 0.6 % SOLN Place 1-2 each into the nose 2 (two) times daily. Reported on 11/11/2015   . Omega-3 Fatty Acids (FISH OIL) 1200 MG CAPS Take 2,400 mg by mouth daily. Reported on 11/11/2015   . [DISCONTINUED] amoxicillin (AMOXIL) 875 MG tablet Take 1 tablet (875 mg total) by mouth 2 (two) times daily.   . [DISCONTINUED] benzonatate (TESSALON) 200 MG capsule Take 1 capsule (200 mg total) by mouth 3 (three) times daily as needed.   . [DISCONTINUED] GuaiFENesin (MUCINEX PO) Take 800 mg by mouth 4 (four) times daily. 07/30/2014: Has 12 hour, Max strength (1200mg ) twice daily  . [DISCONTINUED] oxyCODONE-acetaminophen (ROXICET) 5-325 MG tablet Take 1 tablet by mouth every 4 (four) hours as needed for severe pain.   . [DISCONTINUED] pseudoephedrine (  SUDAFED) 30 MG tablet Take 30 mg by mouth every 4 (four) hours as needed for congestion. 04/29/2015: Uses prn; last dose was yesterday afternoon   No facility-administered encounter medications on file as of 11/11/2015.   Allergies  Allergen Reactions  . Codeine Itching and Nausea And Vomiting   ROS: no fever, chills, URI symptoms.  Mild sinus/teeth discomfort persists, and some residual postnasal drainage.  No purulence, cough, shortness of breath, nausea, vomiting, diarrhea, chest pain, palpitations.  Headaches mild/stable. Moods are good.  PHYSICAL EXAM: BP 128/78 mmHg  Pulse 68  Ht 5\' 4"  (1.626 m)  Wt 174 lb (78.926 kg)  BMI 29.85 kg/m2  LMP 06/24/2015 Well developed, pleasant female in no distress.  Some throat-clearing during the visit HEENT: PERRL, EOMI, conjunctiva and sclera are clear. TM's and EAC's normal. Nasal mucosa appears normal--no significant edema or drainage  OP is clear. Sinuses nontender Neck: no lymphadenopathy ,thyromegaly or mass Heart: regular rate and rhythm without murmur Lungs: clear bilaterally Neuro: alert and oriented, cranial nerves intact; normal strength, gait Psych: normal mood, affect, hygiene and grooming  ASSESSMENT/PLAN:  Migraine without aura and without status migrainosus, not intractable  Obesity (BMI 30-39.9) - continue diet, exercise  (resume regular exercise); continue current Qsymia dose. f/u 3 mos. Call sooner if lack of response or HA's for higher dose - Plan: Phentermine-Topiramate 11.25-69 MG CP24    Given that she has only been on the current dose for one month, and that headaches are doing okay at the current dose, she prefers to continue on this dose rather than increase to the 15/92 dose.  Given rx x 3 mos, but she can call at any time if not seeing the desired effect (either weight loss, or for her headaches) to increase up to the next dose.   Will need to be seen within 2-3 months after higher dose.  F/u 3 month

## 2015-11-13 ENCOUNTER — Telehealth: Payer: Self-pay | Admitting: Neurology

## 2015-11-13 NOTE — Telephone Encounter (Signed)
Brandi Carpenter with [facility] is calling regarding delivery for Botox for the patient.

## 2015-11-15 NOTE — Telephone Encounter (Signed)
Scheduled for delivery on 11/19/2015.

## 2015-11-25 ENCOUNTER — Encounter: Payer: Self-pay | Admitting: Neurology

## 2015-11-28 ENCOUNTER — Other Ambulatory Visit: Payer: Self-pay | Admitting: Neurology

## 2015-11-28 ENCOUNTER — Other Ambulatory Visit: Payer: Self-pay | Admitting: Family Medicine

## 2015-11-28 ENCOUNTER — Ambulatory Visit (INDEPENDENT_AMBULATORY_CARE_PROVIDER_SITE_OTHER): Payer: BC Managed Care – PPO | Admitting: Neurology

## 2015-11-28 VITALS — BP 135/78 | HR 80 | Ht 64.0 in

## 2015-11-28 DIAGNOSIS — G43009 Migraine without aura, not intractable, without status migrainosus: Secondary | ICD-10-CM

## 2015-11-28 DIAGNOSIS — G43709 Chronic migraine without aura, not intractable, without status migrainosus: Secondary | ICD-10-CM | POA: Diagnosis not present

## 2015-11-28 NOTE — Progress Notes (Signed)
Consent Form Botulism Toxin Injection For Chronic Migraine  Botulism toxin has been approved by the Federal drug administration for treatment of chronic migraine. Botulism toxin does not cure chronic migraine and it may not be effective in some patients.  The administration of botulism toxin is accomplished by injecting a small amount of toxin into the muscles of the neck and head. Dosage must be titrated for each individual. Any benefits resulting from botulism toxin tend to wear off after 3 months with a repeat injection required if benefit is to be maintained. Injections are usually done every 3-4 months with maximum effect peak achieved by about 2 or 3 weeks. Botulism toxin is expensive and you should be sure of what costs you will incur resulting from the injection.  The side effects of botulism toxin use for chronic migraine may include:   -Transient, and usually mild, facial weakness with facial injections  -Transient, and usually mild, head or neck weakness with head/neck injections  -Reduction or loss of forehead facial animation due to forehead muscle  weakness  -Eyelid drooping  -Dry eye  -Pain at the site of injection or bruising at the site of injection  -Double vision  -Potential unknown long term risks  Contraindications: You should not have Botox if you are pregnant, nursing, allergic to albumin, have an infection, skin condition, or muscle weakness at the site of the injection, or have myasthenia gravis, Lambert-Eaton syndrome, or ALS.  It is also possible that as with any injection, there may be an allergic reaction or no effect from the medication. Reduced effectiveness after repeated injections is sometimes seen and rarely infection at the injection site may occur. All care will be taken to prevent these side effects. If therapy is given over a long time, atrophy and wasting in the muscle injected may occur. Occasionally the patient's become refractory to treatment because  they develop antibodies to the toxin. In this event, therapy needs to be modified.  I have read the above information and consent to the administration of botulism toxin.  On file  ______________  _____   _________________  Patient signature     Date   Witness signature       BOTOX PROCEDURE NOTE FOR MIGRAINE HEADACHE    Contraindications and precautions discussed with patient(above). Aseptic procedure was observed and patient tolerated procedure. Procedure performed by Dr. Georgia Dom  The condition has existed for more than 6 months, and pt does not have a diagnosis of ALS, Myasthenia Gravis or Lambert-Eaton Syndrome. Risks and benefits of injections discussed and pt agrees to proceed with the procedure. Written consent obtained  These injections are medically necessary.  These injections do not cause sedations or hallucinations which the oral therapies may cause.  Botox-100unitsx2 vials  Lot: [id number]  Expiration: 05/2018  [id number]   0.9% Sodium Chloride- 44mL total  [id number]  Expiration: 06/2016  NDC: [id number]   Description of procedure:  The patient was placed in a sitting position. The standard protocol was used for Botox as follows, with 5 units of Botox injected at each site unless specified as other:   -Procerus muscle, midline injection was not injected due to patient's preference  -Corrugator muscle, bilateral injection was not injected due to patient's preference  -Frontalis muscle was not injected due to patient's preference  -Temporalis muscle injection, 4 sites, bilaterally. The first injection was 3 cm above the tragus of the ear, second injection site was 1.5 cm to 3 cm up from the first  injection site in line with the tragus of the ear. The third injection site was 1.5-3 cm forward between the first 2 injection sites. The fourth injection site was 1.5 cm posterior to the second injection site.  -Occipitalis muscle injection, 3 sites,  bilaterally. The first injection was done one half way between the occipital protuberance and the tip of the mastoid process behind the ear. The second injection site was done lateral and superior to the first, 1 fingerbreadth from the first injection. The third injection site was 1 fingerbreadth superiorly and medially from the first injection site.  -Cervical paraspinal muscle injection, 2 sites, bilateral knee first injection site was 1 cm from the midline of the cervical spine, 3 cm inferior to the lower border of the occipital protuberance. The second injection site was 1.5 cm superiorly and laterally to the first injection site. 10 units was additionally injected in the cervical paraspinals bilaterally.  -Trapezius muscle injection was performed at 3 sites, bilaterally. The first injection site was in the upper trapezius muscle halfway between the inflection point of the neck, and the acromion. The second injection site was one half way between the acromion and the first injection site. The third injection was done between the first injection site and the inflection point of the neck. 60 units on the right and 25 units on the left. The right Levator Scapulae was also injected in 2 locations with 25 units.   Will return for repeat injection in 3 months.   A 200 unit sof Botox was used, 200 units were injected. The patient tolerated the procedure well, there were no complications of the above procedure.

## 2015-11-28 NOTE — Progress Notes (Signed)
Botox-100unitsx2 vials Lot: [id number] Expiration: 05/2018 [id number]  0.9% Sodium Chloride- 60mL total [id number] Expiration: 06/2016 NDC: [id number]

## 2015-11-29 NOTE — Telephone Encounter (Signed)
Dr Jaynee Eagles- looks like you gave pt xanax for MRI. Not sure though because you gave her qty of 20 w/ 3 refills. Ok to refill?

## 2015-11-29 NOTE — Telephone Encounter (Signed)
Ok for #30, RF x 1

## 2015-11-29 NOTE — Telephone Encounter (Signed)
Called in med to pharmacy  

## 2015-11-29 NOTE — Telephone Encounter (Signed)
Is this okay to call in? 

## 2015-11-30 ENCOUNTER — Other Ambulatory Visit: Payer: Self-pay | Admitting: Family Medicine

## 2015-12-02 ENCOUNTER — Encounter: Payer: Self-pay | Admitting: *Deleted

## 2015-12-02 NOTE — Telephone Encounter (Signed)
No.  Looks like she last got it from a different provider (05/2015, with 3 refills)--[facility]  Looks like she has still been seeing her/communicating with her, so should refill from same doc, not switching back and forth

## 2015-12-02 NOTE — Telephone Encounter (Signed)
Is this okay?

## 2015-12-02 NOTE — Progress Notes (Signed)
Faxed rx alprazolam to pt pharmacy. Received confirmation.

## 2015-12-02 NOTE — Telephone Encounter (Signed)
Patient advised. Refill refused.

## 2015-12-05 ENCOUNTER — Encounter: Payer: Self-pay | Admitting: Neurology

## 2015-12-08 ENCOUNTER — Telehealth: Payer: Self-pay | Admitting: Family Medicine

## 2015-12-11 ENCOUNTER — Telehealth: Payer: Self-pay | Admitting: Neurology

## 2015-12-11 NOTE — Telephone Encounter (Signed)
Brandi Carpenter, I got a request to renew patient's alprazolam. But I was not the original prescriber, Rita Ohara was. I think I renewed it, but if she needs anymore refills she needs to go to Rita Ohara as the person who originally prescribed it for her. Would ou let her know? Thanks.

## 2015-12-12 NOTE — Telephone Encounter (Signed)
Called pt and relayed message below. Pt stated Rita Ohara stated she would have to get further refills from Dr Jaynee Eagles since she was last to prescribe but she understands and is going to talk to Dr Tomi Bamberger again. I advised that Dr Tomi Bamberger can call and discuss with [place] if needed. She verbalized understanding.

## 2015-12-16 ENCOUNTER — Other Ambulatory Visit: Payer: Self-pay | Admitting: Orthopaedic Surgery

## 2015-12-16 DIAGNOSIS — M542 Cervicalgia: Secondary | ICD-10-CM

## 2015-12-17 NOTE — Telephone Encounter (Signed)
P.A. Qsymia denied, pt needs trial of alternatives Belviq, Contrave, Saxenda  Do you want to switch?

## 2015-12-17 NOTE — Telephone Encounter (Signed)
She has been taking this medication since November, and it is working well.  She also has significant migraine history, so the topamax within this medication is also helping with that.  Was this information used in the trying to get authorization?

## 2015-12-20 NOTE — Telephone Encounter (Signed)
This type of P.A. was electronic and pretty straight forward and it did ask how long she'd been on this medication and then directly asks what else she has tried and since she has not tried the other medications it was denied.  I can do an appeal letter and give them the information of her significant migraine history but this will take up to 30 days for a response.   Do you want to try an appeal? And if so is there any other information that you would like me to include in the letter?

## 2015-12-20 NOTE — Telephone Encounter (Signed)
Let pt know what is going on please.  I recommend appealing.  Additional information to include is that she is already taking Wellbutrin-- so Contrave is NOT an option (that drug contains bupropion).  Please include how long she has been taking it with good response, and also that her migraines have improved since being on it, since it contains topamax and the other weight loss medications do not; this drug is doing double-duty for this patient

## 2015-12-21 ENCOUNTER — Ambulatory Visit
Admission: RE | Admit: 2015-12-21 | Discharge: 2015-12-21 | Disposition: A | Discharge status: Home / Self Care | Payer: BC Managed Care – PPO | Source: Ambulatory Visit | Attending: Orthopaedic Surgery | Admitting: Orthopaedic Surgery

## 2015-12-21 DIAGNOSIS — M542 Cervicalgia: Secondary | ICD-10-CM

## 2015-12-25 ENCOUNTER — Encounter: Payer: Self-pay | Admitting: Family Medicine

## 2015-12-25 NOTE — Telephone Encounter (Signed)
Appeal letter faxed and pt informed

## 2016-01-03 ENCOUNTER — Ambulatory Visit (INDEPENDENT_AMBULATORY_CARE_PROVIDER_SITE_OTHER): Payer: BC Managed Care – PPO | Admitting: Family Medicine

## 2016-01-03 ENCOUNTER — Encounter: Payer: Self-pay | Admitting: Family Medicine

## 2016-01-03 VITALS — BP 130/76 | HR 69 | Temp 98.0°F | Wt 174.4 lb

## 2016-01-03 DIAGNOSIS — B370 Candidal stomatitis: Secondary | ICD-10-CM

## 2016-01-03 MED ORDER — MAGIC MOUTHWASH W/LIDOCAINE: 5.0000 mL | Freq: Three times a day (TID) | ORAL | Status: DC

## 2016-01-03 NOTE — Patient Instructions (Signed)
Thrush, Adult  Thrush, also called oral candidiasis, is a fungal infection that develops in the mouth and throat and on the tongue. It causes white patches to form on the mouth and tongue. Thrush is most common in older adults, but it can occur at any age.   Many cases of thrush are mild, but this infection can also be more serious. Thrush can be a recurring problem for people who have chronic illnesses or who take medicines that limit the body's ability to fight infection. Because these people have difficulty fighting infections, the fungus that causes thrush can spread throughout the body. This can cause life-threatening blood or organ infections.  CAUSES   Thrush is usually caused by a yeast called Candida albicans. This fungus is normally present in small amounts in the mouth and on other mucous membranes. It usually causes no harm. However, when conditions are present that allow the fungus to grow uncontrolled, it invades surrounding tissues and becomes an infection. Less often, other Candida species can also lead to thrush.   RISK FACTORS  Thrush is more likely to develop in the following people:  · People with an impaired ability to fight infection (weakened immune system).    · Older adults.    · People with HIV.    · People with diabetes.    · People with dry mouth (xerostomia).    · Pregnant women.    · People with poor dental care, especially those who have false teeth.    · People who use antibiotic medicines.    SIGNS AND SYMPTOMS   Thrush can be a mild infection that causes no symptoms. If symptoms develop, they may include:   · A burning feeling in the mouth and throat. This can occur at the start of a thrush infection.    · White patches that adhere to the mouth and tongue. The tissue around the patches may be red, raw, and painful. If rubbed (during tooth brushing, for example), the patches and the tissue of the mouth may bleed easily.    · A bad taste in the mouth or difficulty tasting foods.     · Cottony feeling in the mouth.    · Pain during eating and swallowing.  DIAGNOSIS   Your health care provider can usually diagnose thrush by looking in your mouth and asking you questions about your health.   TREATMENT   Medicines that help prevent the growth of fungi (antifungals) are the standard treatment for thrush. These medicines are either applied directly to the affected area (topical) or swallowed (oral). The treatment will depend on the severity of the condition.   Mild Thrush  Mild cases of thrush may clear up with the use of an antifungal mouth rinse or lozenges. Treatment usually lasts about 14 days.   Moderate to Severe Thrush  · More severe thrush infections that have spread to the esophagus are treated with an oral antifungal medicine. A topical antifungal medicine may also be used.    · For some severe infections, a treatment period longer than 14 days may be needed.    · Oral antifungal medicines are almost never used during pregnancy because the fetus may be harmed. However, if a pregnant woman has a rare, severe thrush infection that has spread to her blood, oral antifungal medicines may be used. In this case, the risk of harm to the mother and fetus from the severe thrush infection may be greater than the risk posed by the use of antifungal medicines.    Persistent or Recurrent Thrush  For cases of   thrush that do not go away or keep coming back, treatment may involve the following:   · Treatment may be needed twice as long as the symptoms last.    · Treatment will include both oral and topical antifungal medicines.    · People with weakened immune systems can take an antifungal medicine on a continuous basis to prevent thrush infections.    It is important to treat conditions that make you more likely to get thrush, such as diabetes or HIV.   HOME CARE INSTRUCTIONS   · Only take over-the-counter or prescription medicine as directed by your health care provider. Talk to your health care  provider about an over-the-counter medicine called gentian violet, which kills bacteria and fungi.    · Eat plain, unflavored yogurt as directed by your health care provider. Check the label to make sure the yogurt contains live cultures. This yogurt can help healthy bacteria grow in the mouth that can stop the growth of the fungus that causes thrush.    · Try these measures to help reduce the discomfort of thrush:      Drink cold liquids such as water or iced tea.      Try flavored ice treats or frozen juices.      Eat foods that are easy to swallow, such as gelatin, ice cream, or custard.      If the patches in your mouth are painful, try drinking from a straw.    · Rinse your mouth several times a day with a warm saltwater rinse. You can make the saltwater mixture with 1 tsp (6 g) of salt in 8 fl oz (0.2 L) of warm water.    · If you wear dentures, remove the dentures before going to bed, brush them vigorously, and soak them in a cleaning solution as directed by your health care provider.    · Women who are breastfeeding should clean their nipples with an antifungal medicine as directed by their health care provider. Dry the nipples after breastfeeding. Applying lanolin-containing body lotion may help relieve nipple soreness.    SEEK MEDICAL CARE IF:  · Your symptoms are getting worse or are not improving within 7 days of starting treatment.    · You have symptoms of spreading infection, such as white patches on the skin outside of the mouth.    · You are nursing and you have redness, burning, or pain in the nipples that is not relieved with treatment.    MAKE SURE YOU:  · Understand these instructions.  · Will watch your condition.  · Will get help right away if you are not doing well or get worse.     This information is not intended to replace advice given to you by your health care provider. Make sure you discuss any questions you have with your health care provider.     Document Released: 05/26/2004 Document  Revised: 09/21/2014 Document Reviewed: 04/03/2013  Elsevier Interactive Patient Education ©2016 [employer]

## 2016-01-03 NOTE — Progress Notes (Signed)
   Subjective:    Patient ID: Brandi Carpenter, female    DOB: 03-01-1966, 50 y.o.   MRN: [id number]  HPI Chief Complaint  Patient presents with  . thrush    possible thrush for a couple weeks.   She is here with a 2 week history of tongue having a white-ish coating and burning. States burning sensation is all the time and seems to be getting worse. States initially she noticed having white spots to back of throat and on uvula. This progressed and she states white patches are now on her tongue. She denies having similar symptoms in past. Has taken 2 steroid dose packs in past 6 weeks.  States she is not immunocompromised. Denies history of diabetes, HIV or autoimmune disorders.  She reports having underlying allergies, gets allergy shots for this.  States she had septoplasty surgery at the end of January. NO recent antibiotics.  Does not smoke, drink or use drugs.   Denies fever, chills, headache, dizziness, unexplained weight change, fatigue, nasal congestion, drainage, sore throat, dysphagia, cough, chest pain, shortness of breath, GI or GU symptoms.    Reviewed allergies, medications, past medical and social history.  Review of Systems Pertinent positives and negatives in the history of present illness.     Objective:   Physical Exam BP 130/76 mmHg  Pulse 69  Temp(Src) 98 F (36.7 C) (Oral)  Wt 174 lb 6.4 oz (79.107 kg)  Alert and in no distress. No sinus tenderness. Tympanic membranes and canals are normal. Mucous membranes moist, tongue with white patches noted, unable to scrape off, nontender, otherwise no lesions or nodules to oral mucosa.  Neck is supple without adenopathy or thyromegaly.       Assessment & Plan:  Oral candidiasis - Plan: magic mouthwash w/lidocaine SOLN  Discussed that she appears to have a mild case of thrush and that this is most likely due to recent steroid use since she is immunocompetent. Magic mouthwash prescription sent to pharmacy. Discussed  good oral hygiene. If she is not improving in 4-5 days she will call and let me know.

## 2016-01-07 NOTE — Telephone Encounter (Signed)
Appeal denied states Formulary exception when evidence of trial of 2 alternatives, based on records submitted pt has a contraindication to Contrave but no contraindication to or trial of Belviq was received.   Do you want to switch? (Denial letter in your folder )

## 2016-01-16 NOTE — Pre-Procedure Instructions (Signed)
    Brandi Carpenter  01/16/2016      [facility] - [place], [place] - [place] [id number] Phone: [phone] Fax: [phone]    Your procedure is scheduled on May 15  Report to [facility] at 1030 A.M.  Call this number if you have problems the morning of surgery:  [phone]   Remember:  Do not eat food or drink liquids after midnight.  Take these medicines the morning of surgery with A SIP OF WATER : Alprazolam (Xanax) if needed, Bupropion (Wellbutrin XL), Fluticasone (Flonase) nasal spray,  Gabapentin (Neurontin) if needed, Olopatadine (Patanol) eye drops if needed, Olopatadine HCL (Patanse) nasal spray     Stop taking aspirin, Ibuprofen, Advil, Motrin, Aleve, Herbal medications, Fish Oil, BC's, Goody's, Phentermine- Topiramate one week prior to surgery   Do not wear jewelry, make-up or nail polish.  Do not wear lotions, powders, or perfumes.  You may wear deodorant.  Do not shave 48 hours prior to surgery.    Do not bring valuables to the hospital.  [facility] is not responsible for any belongings or valuables.  Contacts, dentures or bridgework may not be worn into surgery.  Leave your suitcase in the car.  After surgery it may be brought to your room.  For patients admitted to the hospital, discharge time will be determined by your treatment team.  Patients discharged the day of surgery will not be allowed to drive home.    Please read over the following fact sheets that you were given. Pain Booklet, Coughing and Deep Breathing, MRSA Information and Surgical Site Infection Prevention

## 2016-01-17 ENCOUNTER — Ambulatory Visit (HOSPITAL_COMMUNITY)
Admission: RE | Admit: 2016-01-17 | Discharge: 2016-01-17 | Disposition: A | Discharge status: Home / Self Care | Payer: BC Managed Care – PPO | Source: Ambulatory Visit | Attending: Surgery | Admitting: Surgery

## 2016-01-17 ENCOUNTER — Encounter (HOSPITAL_COMMUNITY): Payer: Self-pay

## 2016-01-17 ENCOUNTER — Encounter (HOSPITAL_COMMUNITY)
Admission: RE | Admit: 2016-01-17 | Discharge: 2016-01-17 | Disposition: A | Discharge status: Home / Self Care | Payer: BC Managed Care – PPO | Source: Ambulatory Visit | Attending: Orthopaedic Surgery | Admitting: Orthopaedic Surgery

## 2016-01-17 DIAGNOSIS — Z01818 Encounter for other preprocedural examination: Secondary | ICD-10-CM | POA: Diagnosis present

## 2016-01-17 HISTORY — DX: Adverse effect of unspecified anesthetic, initial encounter: T41.45XA

## 2016-01-17 HISTORY — DX: Other complications of anesthesia, initial encounter: T88.59XA

## 2016-01-17 LAB — URINALYSIS, ROUTINE W REFLEX MICROSCOPIC
BILIRUBIN URINE: NEGATIVE
Glucose, UA: NEGATIVE mg/dL
Hgb urine dipstick: NEGATIVE
KETONES UR: NEGATIVE mg/dL
LEUKOCYTES UA: NEGATIVE
NITRITE: NEGATIVE
PH: 6.5 (ref 5.0–8.0)
PROTEIN: NEGATIVE mg/dL
Specific Gravity, Urine: 1.023 (ref 1.005–1.030)

## 2016-01-17 LAB — COMPREHENSIVE METABOLIC PANEL
ALBUMIN: 4 g/dL (ref 3.5–5.0)
ALT: 21 U/L (ref 14–54)
ANION GAP: 8 (ref 5–15)
AST: 20 U/L (ref 15–41)
Alkaline Phosphatase: 56 U/L (ref 38–126)
BILIRUBIN TOTAL: 0.7 mg/dL (ref 0.3–1.2)
BUN: 10 mg/dL (ref 6–20)
CHLORIDE: 107 mmol/L (ref 101–111)
CO2: 23 mmol/L (ref 22–32)
Calcium: 9.3 mg/dL (ref 8.9–10.3)
Creatinine, Ser: 1.03 mg/dL — ABNORMAL HIGH (ref 0.44–1.00)
GFR calc Af Amer: >60 mL/min (ref >60)
Glucose, Bld: 82 mg/dL (ref 65–99)
POTASSIUM: 3.6 mmol/L (ref 3.5–5.1)
Sodium: 138 mmol/L (ref 135–145)
TOTAL PROTEIN: 6.6 g/dL (ref 6.5–8.1)

## 2016-01-17 LAB — CBC
HCT: 39.9 % (ref 36.0–46.0)
Hemoglobin: 13.1 g/dL (ref 12.0–15.0)
MCH: 29.6 pg (ref 26.0–34.0)
MCHC: 32.8 g/dL (ref 30.0–36.0)
MCV: 90.3 fL (ref 78.0–100.0)
PLATELETS: 271 10*3/uL (ref 150–400)
RBC: 4.42 MIL/uL (ref 3.87–5.11)
RDW: 12.7 % (ref 11.5–15.5)
WBC: 7.9 10*3/uL (ref 4.0–10.5)

## 2016-01-17 LAB — SURGICAL PCR SCREEN
MRSA, PCR: NEGATIVE
STAPHYLOCOCCUS AUREUS: NEGATIVE

## 2016-01-17 LAB — HCG, SERUM, QUALITATIVE: Preg, Serum: NEGATIVE

## 2016-01-17 LAB — PROTIME-INR
INR: 1.03 (ref 0.00–1.49)
PROTHROMBIN TIME: 13.7 s (ref 11.6–15.2)

## 2016-01-17 LAB — APTT: aPTT: 29 s (ref 24–37)

## 2016-01-17 MED ORDER — CHLORHEXIDINE GLUCONATE 4 % EX LIQD: 60.0000 mL | Freq: Once | CUTANEOUS | Status: DC

## 2016-01-17 NOTE — Telephone Encounter (Signed)
Belviq is also not an option.  She is on citalopram, an SSRI. She shouldn't be on another serotonin agent such as Belviq along with the citalopram. This is in her records and they should have seen this  Kirke Shaggy is an injectable.  At this point, she has been on it, doing well, and 2/3 of the other meds are contraindicated.  If I need to speak to someone directly, let me know.  It has been a full month already.  I don't think it is fair to make her switch to an injectable at this point.  Please keep  The patient abreast as to what is going on

## 2016-01-20 NOTE — Telephone Encounter (Signed)
Called Appeal Analyst Kennyth Lose [id number] ext [phone] & there is not another appeal that Provider can do and nor is there a Peer to Peer where Dr. Tomi Bamberger could call.  Only other option is member appeal,  She will fax a form to me that pt can sign and authorize [place] to do that appeal for her and fax back to [phone]

## 2016-01-23 NOTE — Telephone Encounter (Signed)
Left message for pt

## 2016-01-24 NOTE — H&P (Signed)
Brandi Carpenter is an 50 y.o. female.   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  She is taking anti-inflammatories without relief.  She has gotten temporary relief from the Medrol Dosepak.  Pain wakes her up at night, bothers her on a daily basis, bothers her with working.  She had been treated in the past with therapy without relief.     CURRENT MEDICATIONS:  Include Wellbutrin XL 300 mg daily, Celexa, Xyzal, Flonase, Patanase, and phentermine.     ALLERGIES:  Codeine.     PAST MEDICAL/SURGICAL HISTORY:  Previous surgeries include septoplasty, turbinates; ankle ligament surgery in 1980.   Positive for history of migraines, depression and anxiety.     SOCIAL HISTORY:  The patient is a Corporate treasurer, past Youth worker.  She does not smoke or drink.     FAMILY HISTORY:  Positive for heart disease, diabetes, and grandmother with colon cancer.       Past Medical History  Diagnosis Date  . Depression   . Anxiety   . Insomnia   . Vitamin D deficiency   . Allergic rhinitis, cause unspecified   . Abnormal Pap smear     ASCUS 08/1999; CIN 11/1999, s/p conization  . Headache   . Complication of anesthesia     "difficulty awakening"    Past Surgical History  Procedure Laterality Date  . Ankle surgery  child    left for torn ligaments, left  . Pilonidal cyst excision    . Cervical conization w/bx  2001  . Nasal septoplasty w/ turbinoplasty Bilateral 10/07/2015    Procedure: NASAL SEPTOPLASTY WITH BILATERAL TURBINATE REDUCTION;  Surgeon: Leta Baptist, MD;  Location: [facility];  Service: ENT;  Laterality: Bilateral;    Family History  Problem Relation Age of Onset  . Hypertension Mother   . Diabetes Mother   . Heart disease Father   . Depression Sister   .  Colon cancer Maternal Grandmother      died at 44  . Diabetes Maternal Grandmother   . Breast cancer Cousin   . Alzheimer's disease Paternal Grandmother   . Colon polyps Maternal Uncle   . Migraines Neg Hx    Social History:  reports that she has never smoked. She has never used smokeless tobacco. She reports that she does not drink alcohol or use illicit drugs.  Allergies:  Allergies  Allergen Reactions  . Codeine Itching and Nausea And Vomiting    No prescriptions prior to admission    No results found for this or any previous visit (from the past 48 hour(s)). No results found.  Review of Systems  Constitutional: Negative.   Eyes: Negative.   Respiratory: Negative.   Cardiovascular: Negative.   Gastrointestinal: Negative.   Genitourinary: Negative.   Musculoskeletal: Positive for neck pain.  Skin: Negative.   Neurological: Negative.   Psychiatric/Behavioral: Negative.     There were no vitals taken for this visit. Physical Exam  Constitutional: She is oriented to person, place, and time. No distress.  HENT:  Head: Atraumatic.  Eyes: EOM are normal.  Cardiovascular: Normal rate.   Respiratory: No respiratory distress.  GI: She exhibits no distension.  Musculoskeletal: She exhibits tenderness.  Neurological: She is alert and oriented to person, place, and time.  Skin: Skin  is warm and dry.  Psychiatric: She has a normal mood and affect.    PHYSICAL EXAMINATION:  The patient is alert and oriented.  She has severe brachial plexus tenderness.  Positive Spurling on the right.  Pain with rotation, lateral tilting.  Negative Lhermitte.  Negative Spurling on the left.  Increased pain with cervical load, relief with distraction.  Reflexes are 2+ and symmetrical.  Biceps, triceps, brachial radialis, shoulder girdle, wrist flexion/extension are normal.  Extremity reflexes show no hyperreflexia, no clonus.  Lungs are clear.  No supraclavicular lymphadenopathy.     RADIOGRAPHS:   Cervical MRI is reviewed, which shows right paracentral disk protrusion with uncovertebral degenerative changes, severe foraminal stenosis.  No central stenosis.  She also has broad-based disk protrusion with foraminal stenosis on the left.  She is asymptomatic on the left, but significant facet change at C5-6.  Other levels, 3-4, 4-5 also show some left foraminal narrowing and she does have some mild curvature of her neck.     ASSESSMENT:  We discussed multilevel degenerative changes, disk dehydration and endplate changes and uncovertebral changes on the right side with her severe foraminal stenosis.     PLAN:  We discussed options.  With her paracentral disk protrusion, this would require cervical diskectomy.  Adjacent level C5-6 shows significant foraminal stenosis as well.  She understands that other levels at some point might require a procedure, including possible disk arthroplasty at some point in the distant future if she does have progression at those levels.  She is having significant radicular symptoms, wants to proceed with operative intervention.  The plan would be C5-6, C6-7 anterior cervical diskectomy and fusion, allograft and plate.  She has been through traction, anti-inflammatories, Medrol Dosepak, over the counter treatment, prednisone packs without relief.  We discussed operative procedure, incision, dysphagia, dysphonia, risk of pseudoarthrosis, potential for posterior procedure if 1 of the levels anteriorly did not heal, possible progression of other levels in the future as she has degenerative changes at those levels as well.  Overnight stay in the hospital discussed.  All questions answered.  She states she would like to proceed soon.  She understands she would be in a collar postop for over 6 weeks and be restricted from driving.   Lanae Crumbly, PA-C 01/24/2016, 4:52 PM

## 2016-01-26 MED ORDER — CEFAZOLIN SODIUM-DEXTROSE 2-4 GM/100ML-% IV SOLN
2.0000 g | INTRAVENOUS | Status: AC
  Administered 2016-01-27: 2 g via INTRAVENOUS
  Filled 2016-01-26: qty 100

## 2016-01-27 ENCOUNTER — Ambulatory Visit (HOSPITAL_COMMUNITY): Payer: BC Managed Care – PPO | Admitting: Anesthesiology

## 2016-01-27 ENCOUNTER — Ambulatory Visit (HOSPITAL_COMMUNITY): Payer: BC Managed Care – PPO

## 2016-01-27 ENCOUNTER — Encounter (HOSPITAL_COMMUNITY): Payer: Self-pay | Admitting: Anesthesiology

## 2016-01-27 ENCOUNTER — Observation Stay (HOSPITAL_COMMUNITY)
Admission: RE | Admit: 2016-01-27 | Discharge: 2016-01-28 | Disposition: A | Discharge status: Home / Self Care | Payer: BC Managed Care – PPO | Source: Ambulatory Visit | Attending: Orthopaedic Surgery | Admitting: Orthopaedic Surgery

## 2016-01-27 ENCOUNTER — Encounter (HOSPITAL_COMMUNITY)
Admission: RE | Disposition: A | Discharge status: Home / Self Care | Payer: Self-pay | Source: Ambulatory Visit | Attending: Orthopaedic Surgery

## 2016-01-27 DIAGNOSIS — F419 Anxiety disorder, unspecified: Secondary | ICD-10-CM | POA: Diagnosis not present

## 2016-01-27 DIAGNOSIS — J309 Allergic rhinitis, unspecified: Secondary | ICD-10-CM | POA: Insufficient documentation

## 2016-01-27 DIAGNOSIS — Z79899 Other long term (current) drug therapy: Secondary | ICD-10-CM | POA: Insufficient documentation

## 2016-01-27 DIAGNOSIS — Z7951 Long term (current) use of inhaled steroids: Secondary | ICD-10-CM | POA: Insufficient documentation

## 2016-01-27 DIAGNOSIS — M47812 Spondylosis without myelopathy or radiculopathy, cervical region: Principal | ICD-10-CM | POA: Insufficient documentation

## 2016-01-27 DIAGNOSIS — M50223 Other cervical disc displacement at C6-C7 level: Secondary | ICD-10-CM | POA: Diagnosis not present

## 2016-01-27 DIAGNOSIS — Z419 Encounter for procedure for purposes other than remedying health state, unspecified: Secondary | ICD-10-CM

## 2016-01-27 DIAGNOSIS — Z981 Arthrodesis status: Secondary | ICD-10-CM

## 2016-01-27 DIAGNOSIS — F329 Major depressive disorder, single episode, unspecified: Secondary | ICD-10-CM | POA: Diagnosis not present

## 2016-01-27 HISTORY — PX: ANTERIOR CERVICAL DECOMP/DISCECTOMY FUSION: SHX1161

## 2016-01-27 SURGERY — ANTERIOR CERVICAL DECOMPRESSION/DISCECTOMY FUSION 2 LEVELS
Anesthesia: General

## 2016-01-27 MED ORDER — LIDOCAINE HCL (CARDIAC) 20 MG/ML IV SOLN
INTRAVENOUS | Status: DC | PRN
  Administered 2016-01-27: 60 mg via INTRAVENOUS

## 2016-01-27 MED ORDER — ALPRAZOLAM 0.25 MG PO TABS
0.2500 mg | ORAL_TABLET | Freq: Two times a day (BID) | ORAL | Status: DC | PRN
Start: 2016-01-27 — End: 2016-01-28
  Administered 2016-01-28: 0.25 mg via ORAL
  Filled 2016-01-27: qty 1

## 2016-01-27 MED ORDER — PROPOFOL 10 MG/ML IV BOLUS
INTRAVENOUS | Status: DC | PRN
  Administered 2016-01-27: 30 mg via INTRAVENOUS
  Administered 2016-01-27: 150 mg via INTRAVENOUS

## 2016-01-27 MED ORDER — NEOSTIGMINE METHYLSULFATE 10 MG/10ML IV SOLN
INTRAVENOUS | Status: DC | PRN
  Administered 2016-01-27: 4 mg via INTRAVENOUS

## 2016-01-27 MED ORDER — BUPIVACAINE HCL (PF) 0.25 % IJ SOLN
INTRAMUSCULAR | Status: AC
  Filled 2016-01-27: qty 30

## 2016-01-27 MED ORDER — FENTANYL CITRATE (PF) 250 MCG/5ML IJ SOLN
INTRAMUSCULAR | Status: AC
  Filled 2016-01-27: qty 5

## 2016-01-27 MED ORDER — LEVOCETIRIZINE DIHYDROCHLORIDE 5 MG PO TABS: 5.0000 mg | ORAL_TABLET | Freq: Every evening | ORAL | Status: DC

## 2016-01-27 MED ORDER — SODIUM CHLORIDE 0.9 % IV SOLN: 250.0000 mL | INTRAVENOUS | Status: DC

## 2016-01-27 MED ORDER — MIDAZOLAM HCL 5 MG/5ML IJ SOLN
INTRAMUSCULAR | Status: DC | PRN
  Administered 2016-01-27: 1 mg via INTRAVENOUS

## 2016-01-27 MED ORDER — HYDROMORPHONE HCL 1 MG/ML IJ SOLN
0.5000 mg | INTRAMUSCULAR | Status: DC | PRN
  Administered 2016-01-28: 1 mg via INTRAVENOUS
  Filled 2016-01-27 (×2): qty 1

## 2016-01-27 MED ORDER — FLUTICASONE PROPIONATE 50 MCG/ACT NA SUSP
1.0000 | Freq: Every day | NASAL | Status: DC
  Administered 2016-01-28: 1 via NASAL
  Filled 2016-01-27: qty 16

## 2016-01-27 MED ORDER — NEOSTIGMINE METHYLSULFATE 5 MG/5ML IV SOSY
PREFILLED_SYRINGE | INTRAVENOUS | Status: AC
  Filled 2016-01-27: qty 5

## 2016-01-27 MED ORDER — ARTIFICIAL TEARS OP OINT
TOPICAL_OINTMENT | OPHTHALMIC | Status: AC
  Filled 2016-01-27: qty 7

## 2016-01-27 MED ORDER — DOCUSATE SODIUM 100 MG PO CAPS
100.0000 mg | ORAL_CAPSULE | Freq: Two times a day (BID) | ORAL | Status: DC
  Administered 2016-01-27 – 2016-01-28 (×2): 100 mg via ORAL
  Filled 2016-01-27 (×2): qty 1

## 2016-01-27 MED ORDER — SODIUM CHLORIDE 0.45 % IV SOLN
INTRAVENOUS | Status: DC
  Administered 2016-01-27 (×2): via INTRAVENOUS

## 2016-01-27 MED ORDER — ONDANSETRON HCL 4 MG/2ML IJ SOLN
INTRAMUSCULAR | Status: DC | PRN
  Administered 2016-01-27: 4 mg via INTRAVENOUS

## 2016-01-27 MED ORDER — GLYCOPYRROLATE 0.2 MG/ML IJ SOLN
INTRAMUSCULAR | Status: DC | PRN
  Administered 2016-01-27: 0.6 mg via INTRAVENOUS

## 2016-01-27 MED ORDER — PROPOFOL 10 MG/ML IV BOLUS
INTRAVENOUS | Status: AC
  Filled 2016-01-27: qty 20

## 2016-01-27 MED ORDER — CEFAZOLIN SODIUM 1-5 GM-% IV SOLN
1.0000 g | Freq: Three times a day (TID) | INTRAVENOUS | Status: AC
  Administered 2016-01-27 – 2016-01-28 (×2): 1 g via INTRAVENOUS
  Filled 2016-01-27 (×2): qty 50

## 2016-01-27 MED ORDER — ACETAMINOPHEN 325 MG PO TABS: 650.0000 mg | ORAL_TABLET | ORAL | Status: DC | PRN

## 2016-01-27 MED ORDER — POLYETHYLENE GLYCOL 3350 17 G PO PACK: 17.0000 g | PACK | Freq: Every day | ORAL | Status: DC | PRN

## 2016-01-27 MED ORDER — BUPIVACAINE-EPINEPHRINE 0.25% -1:200000 IJ SOLN
INTRAMUSCULAR | Status: DC | PRN
  Administered 2016-01-27: 7 mL

## 2016-01-27 MED ORDER — ROCURONIUM BROMIDE 100 MG/10ML IV SOLN
INTRAVENOUS | Status: DC | PRN
  Administered 2016-01-27: 40 mg via INTRAVENOUS

## 2016-01-27 MED ORDER — HYDROMORPHONE HCL 1 MG/ML IJ SOLN
INTRAMUSCULAR | Status: AC
  Administered 2016-01-27: 1 mg via INTRAVENOUS
  Filled 2016-01-27: qty 1

## 2016-01-27 MED ORDER — SUGAMMADEX SODIUM 200 MG/2ML IV SOLN
INTRAVENOUS | Status: AC
  Filled 2016-01-27: qty 2

## 2016-01-27 MED ORDER — 0.9 % SODIUM CHLORIDE (POUR BTL) OPTIME
TOPICAL | Status: DC | PRN
  Administered 2016-01-27: 1000 mL

## 2016-01-27 MED ORDER — GABAPENTIN 300 MG PO CAPS: 300.0000 mg | ORAL_CAPSULE | Freq: Three times a day (TID) | ORAL | Status: DC | PRN

## 2016-01-27 MED ORDER — GLYCOPYRROLATE 0.2 MG/ML IV SOSY
PREFILLED_SYRINGE | INTRAVENOUS | Status: AC
  Filled 2016-01-27: qty 3

## 2016-01-27 MED ORDER — PHENYLEPHRINE HCL 10 MG/ML IJ SOLN
10.0000 mg | INTRAVENOUS | Status: DC | PRN
  Administered 2016-01-27: 40 ug/min via INTRAVENOUS
  Administered 2016-01-27: 10 ug/min via INTRAVENOUS

## 2016-01-27 MED ORDER — OLOPATADINE HCL 0.1 % OP SOLN
1.0000 [drp] | Freq: Two times a day (BID) | OPHTHALMIC | Status: DC
  Administered 2016-01-27 – 2016-01-28 (×2): 1 [drp] via OPHTHALMIC
  Filled 2016-01-27: qty 5

## 2016-01-27 MED ORDER — BUPIVACAINE-EPINEPHRINE (PF) 0.25% -1:200000 IJ SOLN
INTRAMUSCULAR | Status: AC
  Filled 2016-01-27: qty 30

## 2016-01-27 MED ORDER — PHENOL 1.4 % MT LIQD: 1.0000 | OROMUCOSAL | Status: DC | PRN

## 2016-01-27 MED ORDER — ACETAMINOPHEN 650 MG RE SUPP: 650.0000 mg | RECTAL | Status: DC | PRN

## 2016-01-27 MED ORDER — HYDROMORPHONE HCL 1 MG/ML IJ SOLN
0.2500 mg | INTRAMUSCULAR | Status: DC | PRN
  Administered 2016-01-27: 0.5 mg via INTRAVENOUS

## 2016-01-27 MED ORDER — CITALOPRAM HYDROBROMIDE 20 MG PO TABS
20.0000 mg | ORAL_TABLET | Freq: Every evening | ORAL | Status: DC
  Administered 2016-01-27: 20 mg via ORAL
  Filled 2016-01-27: qty 1

## 2016-01-27 MED ORDER — ROCURONIUM BROMIDE 50 MG/5ML IV SOLN
INTRAVENOUS | Status: AC
  Filled 2016-01-27: qty 1

## 2016-01-27 MED ORDER — BUPROPION HCL ER (XL) 300 MG PO TB24
300.0000 mg | ORAL_TABLET | Freq: Every day | ORAL | Status: DC
  Filled 2016-01-27: qty 1

## 2016-01-27 MED ORDER — PHENYLEPHRINE HCL 10 MG/ML IJ SOLN
INTRAMUSCULAR | Status: DC | PRN
  Administered 2016-01-27: 40 ug via INTRAVENOUS

## 2016-01-27 MED ORDER — OXYCODONE-ACETAMINOPHEN 5-325 MG PO TABS
1.0000 | ORAL_TABLET | Freq: Four times a day (QID) | ORAL | Status: DC | PRN
Start: 2016-01-27 — End: 2016-01-28
  Administered 2016-01-27 – 2016-01-28 (×3): 1 via ORAL
  Filled 2016-01-27 (×3): qty 1

## 2016-01-27 MED ORDER — PHENYLEPHRINE 40 MCG/ML (10ML) SYRINGE FOR IV PUSH (FOR BLOOD PRESSURE SUPPORT)
PREFILLED_SYRINGE | INTRAVENOUS | Status: AC
  Filled 2016-01-27: qty 10

## 2016-01-27 MED ORDER — DEXAMETHASONE SODIUM PHOSPHATE 10 MG/ML IJ SOLN
INTRAMUSCULAR | Status: AC
  Filled 2016-01-27: qty 1

## 2016-01-27 MED ORDER — SODIUM CHLORIDE 0.9% FLUSH: 3.0000 mL | INTRAVENOUS | Status: DC | PRN

## 2016-01-27 MED ORDER — HYDROCODONE-ACETAMINOPHEN 7.5-325 MG PO TABS: 1.0000 | ORAL_TABLET | Freq: Once | ORAL | Status: DC | PRN

## 2016-01-27 MED ORDER — OLOPATADINE HCL 0.6 % NA SOLN: 1.0000 | Freq: Two times a day (BID) | NASAL | Status: DC

## 2016-01-27 MED ORDER — LACTATED RINGERS IV SOLN
INTRAVENOUS | Status: DC
  Administered 2016-01-27 (×2): via INTRAVENOUS

## 2016-01-27 MED ORDER — FENTANYL CITRATE (PF) 100 MCG/2ML IJ SOLN
INTRAMUSCULAR | Status: DC | PRN
  Administered 2016-01-27: 100 ug via INTRAVENOUS
  Administered 2016-01-27: 50 ug via INTRAVENOUS

## 2016-01-27 MED ORDER — ONDANSETRON HCL 4 MG/2ML IJ SOLN
INTRAMUSCULAR | Status: AC
  Filled 2016-01-27: qty 2

## 2016-01-27 MED ORDER — LORATADINE 10 MG PO TABS
10.0000 mg | ORAL_TABLET | Freq: Every day | ORAL | Status: DC
  Filled 2016-01-27: qty 1

## 2016-01-27 MED ORDER — MENTHOL 3 MG MT LOZG: 1.0000 | LOZENGE | OROMUCOSAL | Status: DC | PRN

## 2016-01-27 MED ORDER — SODIUM CHLORIDE 0.9% FLUSH
3.0000 mL | Freq: Two times a day (BID) | INTRAVENOUS | Status: DC
  Administered 2016-01-27 – 2016-01-28 (×2): 3 mL via INTRAVENOUS

## 2016-01-27 MED ORDER — LIDOCAINE 2% (20 MG/ML) 5 ML SYRINGE
INTRAMUSCULAR | Status: AC
  Filled 2016-01-27: qty 5

## 2016-01-27 MED ORDER — MIDAZOLAM HCL 2 MG/2ML IJ SOLN
INTRAMUSCULAR | Status: AC
  Filled 2016-01-27: qty 2

## 2016-01-27 MED ORDER — DEXAMETHASONE SODIUM PHOSPHATE 10 MG/ML IJ SOLN
INTRAMUSCULAR | Status: DC | PRN
  Administered 2016-01-27: 5 mg via INTRAVENOUS

## 2016-01-27 MED ORDER — ONDANSETRON HCL 4 MG/2ML IJ SOLN
4.0000 mg | INTRAMUSCULAR | Status: DC | PRN
  Administered 2016-01-28: 4 mg via INTRAVENOUS
  Filled 2016-01-27: qty 2

## 2016-01-27 SURGICAL SUPPLY — 59 items
APL SKNCLS STERI-STRIP NONHPOA (GAUZE/BANDAGES/DRESSINGS) ×1
BENZOIN TINCTURE PRP APPL 2/3 (GAUZE/BANDAGES/DRESSINGS) ×3 IMPLANT
BIT DRILL SKYLINE 12MM (BIT) IMPLANT
BLADE SURG ROTATE 9660 (MISCELLANEOUS) IMPLANT
BONE CERV LORDOTIC 14.5X12X6 (Bone Implant) ×3 IMPLANT
BONE CERV LORDOTIC 14.5X12X7 (Bone Implant) ×3 IMPLANT
BUR ROUND FLUTED 4 SOFT TCH (BURR) ×1 IMPLANT
BUR ROUND FLUTED 4MM SOFT TCH (BURR) ×1
CLOSURE WOUND 1/2 X4 (GAUZE/BANDAGES/DRESSINGS) ×1
COLLAR CERV LO CONTOUR FIRM DE (SOFTGOODS) ×2 IMPLANT
CORDS BIPOLAR (ELECTRODE) ×3 IMPLANT
COVER SURGICAL LIGHT HANDLE (MISCELLANEOUS) ×3 IMPLANT
CRADLE DONUT ADULT HEAD (MISCELLANEOUS) ×3 IMPLANT
DRAPE C-ARM 42X72 X-RAY (DRAPES) ×3 IMPLANT
DRAPE MICROSCOPE LEICA (MISCELLANEOUS) ×3 IMPLANT
DRAPE PROXIMA HALF (DRAPES) ×3 IMPLANT
DRILL BIT SKYLINE 12MM (BIT) ×3
DRSG ADAPTIC 3X8 NADH LF (GAUZE/BANDAGES/DRESSINGS) ×2 IMPLANT
DURAPREP 6ML APPLICATOR 50/CS (WOUND CARE) ×3 IMPLANT
ELECT COATED BLADE 2.86 ST (ELECTRODE) ×3 IMPLANT
ELECT REM PT RETURN 9FT ADLT (ELECTROSURGICAL) ×3
ELECTRODE REM PT RTRN 9FT ADLT (ELECTROSURGICAL) ×1 IMPLANT
EVACUATOR 1/8 PVC DRAIN (DRAIN) ×3 IMPLANT
GAUZE SPONGE 4X4 12PLY STRL (GAUZE/BANDAGES/DRESSINGS) ×3 IMPLANT
GAUZE XEROFORM 1X8 LF (GAUZE/BANDAGES/DRESSINGS) IMPLANT
GLOVE BIOGEL PI IND STRL 8 (GLOVE) ×2 IMPLANT
GLOVE BIOGEL PI INDICATOR 8 (GLOVE) ×4
GLOVE ORTHO TXT STRL SZ7.5 (GLOVE) ×6 IMPLANT
GOWN STRL REUS W/ TWL LRG LVL3 (GOWN DISPOSABLE) ×1 IMPLANT
GOWN STRL REUS W/ TWL XL LVL3 (GOWN DISPOSABLE) ×1 IMPLANT
GOWN STRL REUS W/TWL 2XL LVL3 (GOWN DISPOSABLE) ×3 IMPLANT
GOWN STRL REUS W/TWL LRG LVL3 (GOWN DISPOSABLE) ×3
GOWN STRL REUS W/TWL XL LVL3 (GOWN DISPOSABLE) ×3
GRAFT BNE SPCR VG2 14.5X12X6 (Bone Implant) IMPLANT
GRAFT BNE SPCR VG2 14.5X12X7 (Bone Implant) IMPLANT
HEAD HALTER (SOFTGOODS) ×3 IMPLANT
HEMOSTAT SURGICEL 2X14 (HEMOSTASIS) IMPLANT
KIT BASIN OR (CUSTOM PROCEDURE TRAY) ×3 IMPLANT
KIT ROOM TURNOVER OR (KITS) ×3 IMPLANT
MANIFOLD NEPTUNE II (INSTRUMENTS) IMPLANT
NDL 25GX 5/8IN NON SAFETY (NEEDLE) ×1 IMPLANT
NEEDLE 25GX 5/8IN NON SAFETY (NEEDLE) ×3 IMPLANT
NS IRRIG 1000ML POUR BTL (IV SOLUTION) ×3 IMPLANT
PACK ORTHO CERVICAL (CUSTOM PROCEDURE TRAY) ×3 IMPLANT
PAD ARMBOARD 7.5X6 YLW CONV (MISCELLANEOUS) ×6 IMPLANT
PATTIES SURGICAL .5 X.5 (GAUZE/BANDAGES/DRESSINGS) IMPLANT
PIN TEMP SKYLINE THREADED (PIN) ×2 IMPLANT
PLATE TWO LEVEL SKYLINE 30MM (Plate) ×2 IMPLANT
RESTRAINT LIMB HOLDER UNIV (RESTRAINTS) ×2 IMPLANT
SCREW VARIABLE SELF TAP 12MM (Screw) ×12 IMPLANT
STRIP CLOSURE SKIN 1/2X4 (GAUZE/BANDAGES/DRESSINGS) ×2 IMPLANT
SURGIFLO W/THROMBIN 8M KIT (HEMOSTASIS) IMPLANT
SUT BONE WAX W31G (SUTURE) ×3 IMPLANT
SUT VIC AB 3-0 X1 27 (SUTURE) ×3 IMPLANT
SUT VICRYL 4-0 PS2 18IN ABS (SUTURE) ×4 IMPLANT
SYR 30ML SLIP (SYRINGE) ×3 IMPLANT
TOWEL OR 17X24 6PK STRL BLUE (TOWEL DISPOSABLE) ×3 IMPLANT
TOWEL OR 17X26 10 PK STRL BLUE (TOWEL DISPOSABLE) ×3 IMPLANT
TRAY FOLEY CATH 16FR SILVER (SET/KITS/TRAYS/PACK) IMPLANT

## 2016-01-27 NOTE — Anesthesia Preprocedure Evaluation (Addendum)
Anesthesia Evaluation  Patient identified by MRN, date of birth, ID band Patient awake    Reviewed: Allergy & Precautions, NPO status , Patient's Chart, lab work & pertinent test results  Airway Mallampati: II  TM Distance: >3 FB Neck ROM: Limited    Dental  (+) Dental Advisory Given, Teeth Intact   Pulmonary neg pulmonary ROS,    Pulmonary exam normal        Cardiovascular negative cardio ROS Normal cardiovascular exam     Neuro/Psych Anxiety Depression Took her wellbutrin this morningnegative neurological ROS     GI/Hepatic negative GI ROS, Neg liver ROS,   Endo/Other  negative endocrine ROS  Renal/GU negative Renal ROS     Musculoskeletal   Abdominal   Peds  Hematology negative hematology ROS (+)   Anesthesia Other Findings   Reproductive/Obstetrics                           Lab Results  Component Value Date   WBC 7.9 01/17/2016   HGB 13.1 01/17/2016   HCT 39.9 01/17/2016   MCV 90.3 01/17/2016   PLT 271 01/17/2016   Lab Results  Component Value Date   CREATININE 1.03* 01/17/2016   BUN 10 01/17/2016   NA 138 01/17/2016   K 3.6 01/17/2016   CL 107 01/17/2016   CO2 23 01/17/2016    Anesthesia Physical Anesthesia Plan  ASA: II  Anesthesia Plan: General   Post-op Pain Management:    Induction: Intravenous  Airway Management Planned: Oral ETT  Additional Equipment:   Intra-op Plan:   Post-operative Plan: Extubation in OR  Informed Consent: I have reviewed the patients History and Physical, chart, labs and discussed the procedure including the risks, benefits and alternatives for the proposed anesthesia with the patient or authorized representative who has indicated his/her understanding and acceptance.   Dental advisory given  Plan Discussed with: CRNA  Anesthesia Plan Comments:         Anesthesia Quick Evaluation

## 2016-01-27 NOTE — Brief Op Note (Signed)
01/27/2016  2:36 PM  PATIENT:  Brandi Carpenter  50 y.o. female  PRE-OPERATIVE DIAGNOSIS:  C5-6, C6-7 Spondylosis  POST-OPERATIVE DIAGNOSIS:  C5-6, C6-7 Spondylosis  PROCEDURE:  Procedure(s): C5-6, C6-7 Anterior Cervical Discectomy and Fusion, Allograft, Plate (N/A)  SURGEON:  Surgeon(s) and Role:    * Marybelle Killings, MD - Primary  PHYSICIAN ASSISTANT: Benjiman Core pa-c    ANESTHESIA:   general  EBL:  Total I/O In: 1000 [I.V.:1000] Out: 100 [Blood:100]  BLOOD ADMINISTERED:none  DRAINS: hemovac  LOCAL MEDICATIONS USED:  MARCAINE     SPECIMEN:  No Specimen  DISPOSITION OF SPECIMEN:  N/A  COUNTS:  YES  TOURNIQUET:  * No tourniquets in log *  DICTATION: .Dragon Dictation  PLAN OF CARE: Admit for overnight observation  PATIENT DISPOSITION:  PACU - hemodynamically stable.

## 2016-01-27 NOTE — Anesthesia Postprocedure Evaluation (Signed)
Anesthesia Post Note  Patient: Brandi Carpenter  Procedure(s) Performed: Procedure(s) (LRB): C5-6, C6-7 Anterior Cervical Discectomy and Fusion, Allograft, Plate (N/A)  Patient location during evaluation: PACU Anesthesia Type: General Level of consciousness: awake and alert Pain management: pain level controlled Vital Signs Assessment: post-procedure vital signs reviewed and stable Respiratory status: spontaneous breathing, nonlabored ventilation, respiratory function stable and patient connected to nasal cannula oxygen Cardiovascular status: blood pressure returned to baseline and stable Postop Assessment: no signs of nausea or vomiting Anesthetic complications: no    Last Vitals:  Filed Vitals:   01/27/16 1535 01/27/16 1553  BP: 125/79 124/76  Pulse: 75 73  Temp: 36.7 C 36.9 C  Resp: 20 18    Last Pain:  Filed Vitals:   01/27/16 1553  PainSc: 5                  Zenaida Deed

## 2016-01-27 NOTE — Transfer of Care (Signed)
Immediate Anesthesia Transfer of Care Note  Patient: Brandi Carpenter  Procedure(s) Performed: Procedure(s): C5-6, C6-7 Anterior Cervical Discectomy and Fusion, Allograft, Plate (N/A)  Patient Location: PACU  Anesthesia Type:General  Level of Consciousness: awake, alert , oriented and patient cooperative  Airway & Oxygen Therapy: Patient Spontanous Breathing and Patient connected to nasal cannula oxygen  Post-op Assessment: Report given to RN and Post -op Vital signs reviewed and stable  Post vital signs: Reviewed and stable  Last Vitals:  Filed Vitals:   01/27/16 1117 01/27/16 1448  BP: 107/57 102/80  Pulse: 69 84  Temp: 37.2 C 36.8 C  Resp: 20 17    Last Pain: There were no vitals filed for this visit.    Patients Stated Pain Goal: 4 ([id number])  Complications: No apparent anesthesia complications

## 2016-01-27 NOTE — Progress Notes (Signed)
Orthopedic Tech Progress Note Patient Details:  Brandi Carpenter 04/10/1966 [id number]  Ortho Devices Type of Ortho Device: Soft collar Ortho Device/Splint Location: pt already had on a collar. but the dr ordered an extra one for showering. i dropped it off Ortho Device/Splint Interventions: Renard Matter 01/27/2016, 10:05 PM

## 2016-01-27 NOTE — Anesthesia Procedure Notes (Signed)
Procedure Name: Intubation Date/Time: 01/27/2016 12:38 PM Performed by: Williemae Area B Pre-anesthesia Checklist: Patient identified, Emergency Drugs available, Suction available and Patient being monitored Patient Re-evaluated:Patient Re-evaluated prior to inductionOxygen Delivery Method: Circle system utilized Preoxygenation: Pre-oxygenation with 100% oxygen Intubation Type: IV induction Ventilation: Mask ventilation without difficulty and Oral airway inserted - appropriate to patient size Laryngoscope Size: Mac and 3 Grade View: Grade II Tube type: Oral Tube size: 7.5 mm Number of attempts: 1 Airway Equipment and Method: Stylet Placement Confirmation: ETT inserted through vocal cords under direct vision,  positive ETCO2 and breath sounds checked- equal and bilateral Secured at: 21 (cm at teeth) cm Tube secured with: Tape Dental Injury: Teeth and Oropharynx as per pre-operative assessment

## 2016-01-27 NOTE — Interval H&P Note (Signed)
History and Physical Interval Note:  01/27/2016 12:14 PM  Brandi Carpenter  has presented today for surgery, with the diagnosis of C5-6, C6-7 Spondylosis  The various methods of treatment have been discussed with the patient and family. After consideration of risks, benefits and other options for treatment, the patient has consented to  Procedure(s): C5-6, C6-7 Anterior Cervical Discectomy and Fusion, Allograft, Plate (N/A) as a surgical intervention .  [name] history has been reviewed, patient examined, no change in status, stable for surgery.  I have reviewed the patient's chart and labs.  Questions were answered to the patient's satisfaction.     Araly Kaas C

## 2016-01-28 ENCOUNTER — Encounter (HOSPITAL_COMMUNITY): Payer: Self-pay | Admitting: Orthopaedic Surgery

## 2016-01-28 DIAGNOSIS — M47812 Spondylosis without myelopathy or radiculopathy, cervical region: Secondary | ICD-10-CM | POA: Diagnosis not present

## 2016-01-28 MED ORDER — OXYCODONE-ACETAMINOPHEN 5-325 MG PO TABS: 1.0000 | ORAL_TABLET | Freq: Four times a day (QID) | ORAL | Status: DC | PRN

## 2016-01-28 MED ORDER — PROMETHAZINE HCL 25 MG/ML IJ SOLN
12.5000 mg | Freq: Four times a day (QID) | INTRAMUSCULAR | Status: DC | PRN
  Administered 2016-01-28: 12.5 mg via INTRAVENOUS
  Filled 2016-01-28: qty 1

## 2016-01-28 NOTE — Progress Notes (Signed)
Subjective: 1 Day Post-Op Procedure(s) (LRB): C5-6, C6-7 Anterior Cervical Discectomy and Fusion, Allograft, Plate (N/A) Patient reports pain as mild.    Objective: Vital signs in last 24 hours: Temp:  [97.6 F (36.4 C)-98.9 F (37.2 C)] 98.1 F (36.7 C) (05/16 0521) Pulse Rate:  [68-88] 79 (05/16 0521) Resp:  [17-20] 18 (05/16 0521) BP: (96-125)/(53-85) 96/59 mmHg (05/16 0521) SpO2:  [96 %-100 %] 97 % (05/16 0521) Weight:  [78.472 kg (173 lb)] 78.472 kg (173 lb) (05/15 1117)  Intake/Output from previous day: 05/15 0701 - 05/16 0700 In: 1320 [I.V.:1320] Out: 125 [Drains:25; Blood:100] Intake/Output this shift:    No results for input(s): HGB in the last 72 hours. No results for input(s): WBC, RBC, HCT, PLT in the last 72 hours. No results for input(s): NA, K, CL, CO2, BUN, CREATININE, GLUCOSE, CALCIUM in the last 72 hours. No results for input(s): LABPT, INR in the last 72 hours.  Neurologically intact  Assessment/Plan: 1 Day Post-Op Procedure(s) (LRB): C5-6, C6-7 Anterior Cervical Discectomy and Fusion, Allograft, Plate (N/A) Plan : discharge home. Office one week. rx on chart.   YATES,MARK C 01/28/2016, 7:37 AM

## 2016-01-28 NOTE — Op Note (Signed)
Brandi Carpenter, Brandi Carpenter               ACCOUNT NO.:  [id number]  MEDICAL RECORD NO.:  [id number]  LOCATION:  [id number]                        FACILITY:  [facility]  PHYSICIAN:  Michell Kader C. Lorin Mercy, M.D.    DATE OF BIRTH:  [birth year]  DATE OF PROCEDURE:  01/27/2016 DATE OF DISCHARGE:                              OPERATIVE REPORT   PREOPERATIVE DIAGNOSIS:  Cervical spondylosis, C6-7, herniated nucleus pulposus.  POSTOPERATIVE DIAGNOSIS:  Cervical spondylosis C6-7, herniated nucleus pulposus.  PROCEDURE:  C5-6, C6-7 anterior cervical diskectomy, allograft and plate.  SURGEON:  Remmi Armenteros C. Lorin Mercy, M.D.  ASSISTANT:  Alyson Locket. Ricard Dillon, PA-C, medically necessary and present for the entire procedure.  ANESTHESIA:  General plus 7 mL of Marcaine local.  DRAINS:  One Hemovac neck.  ESTIMATED BLOOD LOSS:  Less than 100 mL.  IMPLANTS:  DePuy Skyline plate, 30-mm length, 12-mm screws x6.  VG2 graft, LifeNet 6-mm graft at C5-6 and 7-mm graft at C6-7.  COMPLICATIONS:  None.  DESCRIPTION OF PROCEDURE:  After induction of general anesthesia, standard prepping and draping, head halter traction was applied.  Arms were tucked to the side with yellow pads.  Wrist restraints were tied, so traction could be pulled for visualization intraoperatively during the case once the C-arm picture was taken from lateral.  Next, prepped with DuraPrep, Ancef was given prophylactically.  Area was squared with towels, sterile skin marker and the planned incision, which was started at the midline extending to the left, in line with the skin folds. Betadine, Steri-Drape, one-half and sterile Mayo stand, thyroid sheet, half sheet above.  Incision was made starting at the midline, extending to the left after time-out, platysma was split in line with the fibers. Blunt dissection down the level and longus colli was performed, prominent vein was coagulated at the level of the platysma, which as the Gelpi was moved down underneath the  platysma started to having some bleeding.  There was good hemostasis.  Prominent spurs were palpated at C5-6 and C6-7.  Longus coli elevated off the midline, short 25 needle straight clamp placed at C5-6 confirmed appropriate level with lateral C- arm.  Spur was removed.  Disk portion was removed for marking and then self-retaining retractors were placed at the level below at C6-7, which had the paracentral disk protrusion on the right.  Anterior spurs were removed.  The Cloward curettes were used to scrape and remove the disk. Microscope was draped and brought in.  Posterior longitudinal ligament was taken down, microdissection 1 and 2-mm Kerrisons and care was taken to decompress some of the disk material that had extruded inferiorly behind the body of C7, sweeping up and removing it.  Dura was completely to decompress right to left and was bulging.  Trial Sizer showed a 7 gave a good fit, 6 was slightly loose.  Traction was pulled by the CRNA. After anterior spurs had been removed, graft was countersunk 2 mm, had good tight fit.  Identical procedure repeated at C5-6.  At this level, there was more spondylosis, uncovertebral joints were stripped the Cloward curettes, decorticating the minimal use of the bur.  Posterior longitudinal ligament was completely taken down decompressing the dura and a 6-mm graft  gave a nice fit at this level.  The plate was placed. Six screws were placed, checked under C-arm.  Final pictures, AP and lateral, good position of grafts.  Good position and alignment of screws.  Plate was in the midline on AP.  Wound was irrigated.  Screws were locked in with the tiny screwdriver x6.  Hemovac drain placed with an in-and-out technique, in line with the skin incision.  3-0 Vicryl in the platysmal, 4-0 Vicryl subcuticular closure.  Tincture of benzoin, Steri-Strips, 4x4s, Webril, and tape were applied.  The patient tolerated the procedure well, transferred to the  recovery room in stable condition.     Patsey Pitstick C. Lorin Mercy, M.D.     MCY/MEDQ  D:  01/27/2016  T:  01/28/2016  Job:  [id number]

## 2016-01-28 NOTE — Progress Notes (Signed)
Orthopedic Tech Progress Note Patient Details:  Brandi Carpenter 09/13/1966 [id number]  Ortho Devices Type of Ortho Device: Soft collar Ortho Device/Splint Location: pt already had on a collar. but the dr ordered an extra one for showering. i dropped it off Ortho Device/Splint Interventions: Application   [name] 01/28/2016, 9:20 AM

## 2016-02-14 NOTE — Discharge Summary (Signed)
Patient ID: KINSHASA EDINGER MRN: [id number] DOB/AGE: 1966/06/30 50 y.o.  Admit date: 01/27/2016 Discharge date: 02/14/2016  Admission Diagnoses:  Active Problems:   Status post cervical spinal fusion   Discharge Diagnoses:  Active Problems:   Status post cervical spinal fusion  status post Procedure(s): C5-6, C6-7 Anterior Cervical Discectomy and Fusion, Allograft, Plate  Past Medical History  Diagnosis Date  . Depression   . Anxiety   . Insomnia   . Vitamin D deficiency   . Allergic rhinitis, cause unspecified   . Abnormal Pap smear     ASCUS 08/1999; CIN 11/1999, s/p conization  . Headache   . Complication of anesthesia     "difficulty awakening"    Surgeries: Procedure(s): C5-6, C6-7 Anterior Cervical Discectomy and Fusion, Allograft, Plate on [id number]   Consultants:    Discharged Condition: Improved  Hospital Course: Brandi Carpenter is an 50 y.o. female who was admitted 01/27/2016 for operative treatment of cervical stenosis/HNP. Patient failed conservative treatments (please see the history and physical for the specifics) and had severe unremitting pain that affects sleep, daily activities and work/hobbies. After pre-op clearance, the patient was taken to the operating room on 01/27/2016 and underwent  Procedure(s): C5-6, C6-7 Anterior Cervical Discectomy and Fusion, Allograft, Plate.    Patient was given perioperative antibiotics:  Anti-infectives    Start     Dose/Rate Route Frequency Ordered Stop   01/27/16 2030  ceFAZolin (ANCEF) IVPB 1 g/50 mL premix     1 g 100 mL/hr over 30 Minutes Intravenous Every 8 hours 01/27/16 1545 01/28/16 0459   01/27/16 1200  ceFAZolin (ANCEF) IVPB 2g/100 mL premix     2 g 200 mL/hr over 30 Minutes Intravenous To ShortStay Surgical 01/26/16 1449 01/27/16 1255       Patient was given sequential compression devices and early ambulation to prevent DVT.   Patient benefited maximally from hospital stay and there were no  complications. At the time of discharge, the patient was urinating/moving their bowels without difficulty, tolerating a regular diet, pain is controlled with oral pain medications and they have been cleared by PT/OT.   Recent vital signs: No data found.    Recent laboratory studies: No results for input(s): WBC, HGB, HCT, PLT, NA, K, CL, CO2, BUN, CREATININE, GLUCOSE, INR, CALCIUM in the last 72 hours.  Invalid input(s): PT, 2   Discharge Medications:     Medication List    TAKE these medications        ALPRAZolam 0.5 MG tablet  Commonly known as:  XANAX  take 0.5 to 1 tablet by mouth three times a day if needed for anxiety     buPROPion 300 MG 24 hr tablet  Commonly known as:  WELLBUTRIN XL  take 1 tablet by mouth every morning     citalopram 20 MG tablet  Commonly known as:  CELEXA  take 1 tablet by mouth once daily     EPIPEN 2-PAK 0.3 mg/0.3 mL Soaj injection  Generic drug:  EPINEPHrine  Reported on 11/11/2015     fluticasone 50 MCG/ACT nasal spray  Commonly known as:  FLONASE  INSTILL 2 SPRAYS INTO EACH NOSTRIL twice a day     gabapentin 300 MG capsule  Commonly known as:  NEURONTIN  Take 300 mg by mouth 3 (three) times daily as needed (for pain).     levocetirizine 5 MG tablet  Commonly known as:  XYZAL  take 1 tablet by mouth every evening  magic mouthwash w/lidocaine Soln  Take 5 mLs by mouth 3 (three) times daily.     MULTIVITAMIN PO  Take 1 tablet by mouth daily.     olopatadine 0.1 % ophthalmic solution  Commonly known as:  PATANOL  Place 1 drop into both eyes 2 (two) times daily.     OVER THE COUNTER MEDICATION  Take 1 tablet by mouth 2 (two) times daily.     oxyCODONE-acetaminophen 5-325 MG tablet  Commonly known as:  PERCOCET/ROXICET  Take 1-2 tablets by mouth every 6 (six) hours as needed for moderate pain.     PATANASE 0.6 % Soln  Generic drug:  Olopatadine HCl  Place 1-2 each into the nose 2 (two) times daily. Reported on 11/11/2015      Phentermine-Topiramate 11.25-69 MG Cp24  Take 1 capsule by mouth daily.     Vitamin D 2000 units tablet  Take 2,000 Units by mouth daily.     zolpidem 10 MG tablet  Commonly known as:  AMBIEN  take 1 tablet by mouth at bedtime if needed for sleep        Diagnostic Studies: Dg Chest 2 View  01/17/2016  CLINICAL DATA:  Preop cervical fusion. EXAM: CHEST  2 VIEW COMPARISON:  04/30/2015 FINDINGS: The heart size and mediastinal contours are within normal limits. Both lungs are clear. The visualized skeletal structures are unremarkable. IMPRESSION: No active cardiopulmonary disease. Electronically Signed   By: Rolm Baptise M.D.   On: 01/17/2016 09:15   Dg Cervical Spine 2 Or 3 Views  01/27/2016  CLINICAL DATA:  Portable cervical spine imaging for cervical spine fusion. EXAM: DG C-ARM 61-120 MIN; CERVICAL SPINE - 2-3 VIEW COMPARISON:  Cervical MRI, 12/21/2015 FINDINGS: Images show placement of an anterior fusion plate and fixation screws at C5, C6 and C7 with apparent bone graft material within the disc spaces. The orthopedic hardware is well-seated well-positioned. No acute fracture or evidence of an operative complication. IMPRESSION: Well-aligned orthopedic hardware following C5 through C7 anterior cervical spine fusion. Electronically Signed   By: Lajean Manes M.D.   On: 01/27/2016 14:34   Dg C-arm 1-60 Min  01/27/2016  CLINICAL DATA:  Portable cervical spine imaging for cervical spine fusion. EXAM: DG C-ARM 61-120 MIN; CERVICAL SPINE - 2-3 VIEW COMPARISON:  Cervical MRI, 12/21/2015 FINDINGS: Images show placement of an anterior fusion plate and fixation screws at C5, C6 and C7 with apparent bone graft material within the disc spaces. The orthopedic hardware is well-seated well-positioned. No acute fracture or evidence of an operative complication. IMPRESSION: Well-aligned orthopedic hardware following C5 through C7 anterior cervical spine fusion. Electronically Signed   By: Lajean Manes M.D.    On: 01/27/2016 14:34        Discharge Instructions    Call MD / Call 911    Complete by:  As directed   If you experience chest pain or shortness of breath, CALL 911 and be transported to the hospital emergency room.  If you develope a fever above 101 F, pus (white drainage) or increased drainage or redness at the wound, or calf pain, call your surgeon's office.     Constipation Prevention    Complete by:  As directed   Drink plenty of fluids.  Prune juice may be helpful.  You may use a stool softener, such as Colace (over the counter) 100 mg twice a day.  Use MiraLax (over the counter) for constipation as needed.     Diet - low sodium heart  healthy    Complete by:  As directed      Discharge instructions    Complete by:  As directed   Ok to shower 5 days postop.  Do not apply any creams or ointments to incision.  Do not remove steri-strips.  Can use 4x4 gauze and tape for dressing changes.  No aggressive activity. Cervical collar must be on at all times even when showering.  Do not bend or turn neck.     Driving restrictions    Complete by:  As directed   No driving     Increase activity slowly as tolerated    Complete by:  As directed      Lifting restrictions    Complete by:  As directed   No lifting             Discharge Plan:  discharge to home  Disposition:     Signed: Lanae Crumbly for Rodell Perna MD 02/14/2016, 6:09 PM

## 2016-02-17 ENCOUNTER — Ambulatory Visit (INDEPENDENT_AMBULATORY_CARE_PROVIDER_SITE_OTHER): Payer: BC Managed Care – PPO | Admitting: Family Medicine

## 2016-02-17 ENCOUNTER — Encounter: Payer: Self-pay | Admitting: Family Medicine

## 2016-02-17 VITALS — BP 110/78 | HR 80 | Ht 64.0 in | Wt 171.0 lb

## 2016-02-17 DIAGNOSIS — F325 Major depressive disorder, single episode, in full remission: Secondary | ICD-10-CM | POA: Diagnosis not present

## 2016-02-17 DIAGNOSIS — E669 Obesity, unspecified: Secondary | ICD-10-CM

## 2016-02-17 DIAGNOSIS — F419 Anxiety disorder, unspecified: Secondary | ICD-10-CM

## 2016-02-17 MED ORDER — PHENTERMINE-TOPIRAMATE ER 11.25-69 MG PO CP24: 1.0000 | ORAL_CAPSULE | Freq: Every day | ORAL | Status: DC

## 2016-02-17 NOTE — Patient Instructions (Signed)
Continue current medications. Call when refills are needed. Return sooner than November if having any problems or concerns.

## 2016-02-17 NOTE — Progress Notes (Signed)
Chief Complaint  Patient presents with  . Depression    fasting (smoothie 1.5hrs ago) med check and follow up on qsymia.    She had neck surgery 3 weeks ago, and had immediate relief of right arm pain, numbness and tingling.  She hasn't had too much neck pain (mostly muscular, since being back to work).  Hasn't had any migraines.   She has been through one series of Botox injections back in March, for her migraines.  Patient presents to follow up on her weight loss with Qsymia. She denies any side effects. Starting, weight, prior to meds, was 180#  Wt Readings from Last 3 Encounters:  02/17/16 171 lb (77.565 kg)  01/27/16 173 lb (78.472 kg)  01/17/16 173 lb 11.2 oz (78.79 kg)   Depression/Anxiety: She reports moods are good on her current regimen. Denies any side effects.  Denies feeling sad, hopeless, helpless.  She felt down a few times, but thinks related to her recent surgery/healing.  Feelings are short-lived. Denies SI/HI. Uses xanax less than once a week (1/2 tablet usually) prn anxiety. (There was some issue with refill request recently due to neuro rx'ing for MRI, but put refills, which expired and never filled by pt). Dr. Lavell Anchors ultimately renewed it for her in March (with 3 refills).  Insomnia: intermittent. Uses 1/2-1 tablet of ambien as needed, with good results and no side effects. she still uses about 10/month.  Since her neck surgery, she has only needed it a couple of times.  She doesn't recall how many she has left (or if she got the refill). Thinks the bottle is not close to empty.  PMH, PSH, SH reviewed  Outpatient Encounter Prescriptions as of 02/17/2016  Medication Sig Note  . ALPRAZolam (XANAX) 0.5 MG tablet take 0.5 to 1 tablet by mouth three times a day if needed for anxiety (Patient taking differently: take 0.5 to 1 tablet by mouth daily if needed for anxiety) 02/17/2016: Uses less than once a week (1/2 tablet usually)  . buPROPion (WELLBUTRIN XL) 300 MG 24 hr tablet  take 1 tablet by mouth every morning (Patient taking differently: Take 300 mg by mouth daily. take 1 tablet by mouth every morning)   . Cholecalciferol (VITAMIN D) 2000 UNITS tablet Take 2,000 Units by mouth daily.   . citalopram (CELEXA) 20 MG tablet take 1 tablet by mouth once daily (Patient taking differently: Take 20 mg by mouth every evening. )   . fluticasone (FLONASE) 50 MCG/ACT nasal spray INSTILL 2 SPRAYS INTO EACH NOSTRIL twice a day   . levocetirizine (XYZAL) 5 MG tablet take 1 tablet by mouth every evening (Patient taking differently: Take 5 mg by mouth every evening. )   . Multiple Vitamins-Minerals (MULTIVITAMIN PO) Take 1 tablet by mouth daily.   Marland Kitchen olopatadine (PATANOL) 0.1 % ophthalmic solution Place 1 drop into both eyes 2 (two) times daily. (Patient taking differently: Place 1 drop into both eyes daily as needed for allergies. )   . Olopatadine HCl (PATANASE) 0.6 % SOLN Place 1-2 each into the nose 2 (two) times daily. Reported on 11/11/2015   . OVER THE COUNTER MEDICATION Take 1 tablet by mouth 2 (two) times daily.  08/01/2015: Nature's Bounty AM/PM menopausal supplement  . Phentermine-Topiramate 11.25-69 MG CP24 Take 1 capsule by mouth daily.   . promethazine (PHENERGAN) 25 MG tablet Reported on 02/17/2016 02/17/2016: Received from: External Pharmacy  . zolpidem (AMBIEN) 10 MG tablet take 1 tablet by mouth at bedtime if needed for  sleep   . EPIPEN 2-PAK 0.3 MG/0.3ML SOAJ injection Reported on 02/17/2016   . gabapentin (NEURONTIN) 300 MG capsule Take 300 mg by mouth 3 (three) times daily as needed (for pain). Reported on 02/17/2016   . HYDROcodone-acetaminophen (NORCO) 10-325 MG tablet Reported on 02/17/2016 02/17/2016: Received from: External Pharmacy  . oxyCODONE-acetaminophen (PERCOCET/ROXICET) 5-325 MG tablet Take 1-2 tablets by mouth every 6 (six) hours as needed for moderate pain. (Patient not taking: Reported on 02/17/2016)   . [DISCONTINUED] magic mouthwash w/lidocaine SOLN Take 5 mLs by  mouth 3 (three) times daily.    No facility-administered encounter medications on file as of 02/17/2016.   Not really needing pain pills currently.  Uses hydrocodone if needed (not often).  Allergies  Allergen Reactions  . Codeine Itching and Nausea And Vomiting   ROS:  No fever, chills, URI or significant allergy symptoms. No headaches, dizziness, chest pain, palpitations, numbness, tingling, weakness, nausea, vomiting, bowel changes, bleeding, bruising, rash or other complaints, except as noted in HPI She has had some ringing in her ears, intermittently.  PHYSICAL EXAM: BP 110/78 mmHg  Pulse 80  Ht 5\' 4"  (1.626 m)  Wt 171 lb (77.565 kg)  BMI 29.34 kg/m2  LMP 01/06/2016  Well developed, pleasant female, in good spirits.  Wearing soft neck brace HEENT: PERRL, EOMI, conjunctiva and sclera are clear, OP clear Neck: no examined--wearing soft collar, bandage applied to wound Heart: regular rate and rhythm without murmur Lungs: clear bilaterally Abdomen: soft, nontender Extremities: no edema Neuro: alert and oriented, normal gait Psych: normal mood, affect, hygiene and grooming; normal eye contact, speech   ASSESSMENT/PLAN:  Anxiety - controlled on current regimen  Obesity (BMI 30-39.9) - continue diet, exercise  (resume regular exercise when cleared by surgeon); continue current Qsymia dose. f/u 3 mos.  - Plan: Phentermine-Topiramate 11.25-69 MG CP24  Depression, major, in remission ([place]) - continue current medications   F/u as scheduled CPE 11/29 Can call for add'l 3 month refill of Qsymia if doing well. If not doing well (ie not losing weight, wants higher dose), schedule OV

## 2016-03-06 ENCOUNTER — Ambulatory Visit (INDEPENDENT_AMBULATORY_CARE_PROVIDER_SITE_OTHER): Payer: BC Managed Care – PPO | Admitting: Family Medicine

## 2016-03-06 ENCOUNTER — Encounter: Payer: Self-pay | Admitting: Family Medicine

## 2016-03-06 VITALS — BP 120/80 | HR 68 | Temp 98.1°F | Wt 168.6 lb

## 2016-03-06 DIAGNOSIS — B370 Candidal stomatitis: Secondary | ICD-10-CM

## 2016-03-06 LAB — POCT RAPID STREP A (OFFICE): RAPID STREP A SCREEN: NEGATIVE

## 2016-03-06 MED ORDER — FLUCONAZOLE 100 MG PO TABS: 100.0000 mg | ORAL_TABLET | Freq: Once | ORAL | Status: DC

## 2016-03-06 NOTE — Patient Instructions (Signed)
Follow up 2 weeks to see if you are back to baseline. Do not use Flonase more than instructions recommend. Salt water gargles for discomfort.

## 2016-03-06 NOTE — Progress Notes (Signed)
   Subjective:    Patient ID: Brandi Carpenter, female    DOB: 1966/02/09, 50 y.o.   MRN: [id number]  HPI Chief Complaint  Patient presents with  . throat    white spots and raw feeling. thrust back in april   She is here with complaints of her mouth feeling "raw" and white spots for past few days. Throat is mildly uncomfortable, no pain with swallowing.  She states she has been using Flonase several times per day for nasal congestion, more than recommended. She is 6 weeks status post surgery for cervical fusion. Still wearing a soft neck collar.    She denies fever, chills, ear pain, headache, chest pain, shortness of breath, GI or GU symptoms.  She is immunocompetent and denies history of diabetes, HIV.  Denies smoking, alcohol or drug use.   Reviewed allergies, medications, past medical and social history.   Review of Systems Pertinent positives and negatives in the history of present illness.     Objective:   Physical Exam  Constitutional: She appears well-developed and well-nourished. No distress.  HENT:  Right Ear: Tympanic membrane normal.  Left Ear: Tympanic membrane normal.  Nose: Mucosal edema present. Right sinus exhibits no maxillary sinus tenderness and no frontal sinus tenderness. Left sinus exhibits no maxillary sinus tenderness and no frontal sinus tenderness.  Mouth/Throat: Uvula is midline. Mucous membranes are dry.    Ulcerated, white patches to hard palate. Post oropharyngeal erythema. Tongue normal appearing, no other lesions to oral mucosa.   Positive KOH to scrapings from hard palate.   Cardiovascular: Normal rate, regular rhythm and normal heart sounds.  Exam reveals no gallop and no friction rub.   No murmur heard. Pulmonary/Chest: Effort normal and breath sounds normal.  Lymphadenopathy:       Head (right side): No submental, no submandibular, no tonsillar and no occipital adenopathy present.       Head (left side): No submental, no submandibular, no  tonsillar and no occipital adenopathy present.    She has no cervical adenopathy.   BP 120/80 mmHg  Pulse 68  Temp(Src) 98.1 F (36.7 C) (Oral)  Wt 168 lb 9.6 oz (76.476 kg)  LMP 01/06/2016     Assessment & Plan:  Oral candidiasis - Plan: fluconazole (DIFLUCAN) 100 MG tablet, POCT rapid strep A  Confirmed diagnosis via KOH testing. Suspect etiology may be due to overuse of Flonase. Discussed not using medication more than prescribed or recommended or there are possible risks and side effects.  Dr. Redmond School also examined patient and in agreement with plan of care. Oral Diflucan sent to pharmacy. Recommend follow up in 2 weeks.

## 2016-03-10 ENCOUNTER — Encounter: Payer: Self-pay | Admitting: Internal Medicine

## 2016-03-11 NOTE — Telephone Encounter (Signed)
Called pt & unable to reach so called pharmacy & pt has been paying out of pocket for Qsymia, with loyalty discount card

## 2016-03-18 ENCOUNTER — Telehealth: Payer: Self-pay

## 2016-03-18 DIAGNOSIS — B370 Candidal stomatitis: Secondary | ICD-10-CM

## 2016-03-18 MED ORDER — FLUCONAZOLE 100 MG PO TABS: 100.0000 mg | ORAL_TABLET | Freq: Once | ORAL | Status: DC

## 2016-03-18 NOTE — Telephone Encounter (Signed)
Fax request recvd for fluconazole 100mg  tablets.

## 2016-03-18 NOTE — Telephone Encounter (Signed)
Pt states she has an appt with Dr. Lutricia Feil ENT next Tuesday and they recommended that she get another round of meds from [place] until she can be seen. She has an appt with [place] on Thursday. Refilled med until tuesday

## 2016-03-18 NOTE — Telephone Encounter (Signed)
Please check on this. She should not need a refill. Is she not any better?

## 2016-03-26 ENCOUNTER — Ambulatory Visit (INDEPENDENT_AMBULATORY_CARE_PROVIDER_SITE_OTHER): Payer: BC Managed Care – PPO | Admitting: Family Medicine

## 2016-03-26 ENCOUNTER — Encounter: Payer: Self-pay | Admitting: Family Medicine

## 2016-03-26 VITALS — BP 110/68 | HR 64 | Temp 98.0°F | Wt 168.4 lb

## 2016-03-26 DIAGNOSIS — Z09 Encounter for follow-up examination after completed treatment for conditions other than malignant neoplasm: Secondary | ICD-10-CM | POA: Diagnosis not present

## 2016-03-26 NOTE — Progress Notes (Signed)
   Subjective:    Patient ID: Brandi Carpenter, female    DOB: September 30, 1965, 50 y.o.   MRN: [id number]  HPI Chief Complaint  Patient presents with  . 2 week follow-up    2 week follow-up, feeling better but still feels raw   She is here for a 2 week follow up on oral candidiasis. States her symptoms have improved significantly. Minimal "raw" sensation to mouth occasionally only. Denies fever, chills, nausea, vomiting.  She went to ENT on Tuesday for follow up after sinus surgery and was told everything appeared to be doing well.    Review of Systems Pertinent positives and negatives in the history of present illness.     Objective:   Physical Exam  Constitutional: She appears well-developed and well-nourished. No distress.  HENT:  Mouth/Throat: Oropharynx is clear and moist and mucous membranes are normal.  Lymphadenopathy:    She has no cervical adenopathy.   BP 110/68 mmHg  Pulse 64  Temp(Src) 98 F (36.7 C) (Oral)  Wt 168 lb 6.4 oz (76.386 kg)     Assessment & Plan:  Follow up  Discussed that oral candidiasis appears to be resolved. Continue to use Flonase only as directed. She will follow up as scheduled with ENT and  PCP.

## 2016-03-30 ENCOUNTER — Other Ambulatory Visit: Payer: Self-pay | Admitting: Family Medicine

## 2016-03-30 NOTE — Telephone Encounter (Signed)
Is this okay to call in? 

## 2016-03-30 NOTE — Telephone Encounter (Signed)
Ok for #30, no refill 

## 2016-05-09 ENCOUNTER — Other Ambulatory Visit: Payer: Self-pay | Admitting: Family Medicine

## 2016-05-11 NOTE — Telephone Encounter (Signed)
Is this okay to refill? 

## 2016-05-12 ENCOUNTER — Telehealth: Payer: Self-pay

## 2016-05-12 NOTE — Telephone Encounter (Signed)
Ok for #30 with 1 refill 

## 2016-05-12 NOTE — Telephone Encounter (Signed)
Faxed request for ambien to [facility] on [address] please.

## 2016-05-21 ENCOUNTER — Telehealth: Payer: Self-pay | Admitting: Neurology

## 2016-05-21 NOTE — Telephone Encounter (Signed)
Called patient to see if she will be continuing with botox injections. Left a vm asking her to call me back.

## 2016-05-28 ENCOUNTER — Other Ambulatory Visit: Payer: Self-pay | Admitting: Family Medicine

## 2016-05-28 DIAGNOSIS — J309 Allergic rhinitis, unspecified: Secondary | ICD-10-CM

## 2016-05-28 NOTE — Telephone Encounter (Signed)
Is this okay to refill? 

## 2016-06-11 ENCOUNTER — Other Ambulatory Visit: Payer: Self-pay | Admitting: Family Medicine

## 2016-06-11 DIAGNOSIS — E669 Obesity, unspecified: Secondary | ICD-10-CM

## 2016-06-11 NOTE — Telephone Encounter (Signed)
Is this okay to refill? 

## 2016-06-11 NOTE — Telephone Encounter (Signed)
Ok to refill x 2 mos (has visit 11/29)

## 2016-07-21 ENCOUNTER — Other Ambulatory Visit: Payer: Self-pay | Admitting: Family Medicine

## 2016-07-21 DIAGNOSIS — F325 Major depressive disorder, single episode, in full remission: Secondary | ICD-10-CM

## 2016-08-10 NOTE — Progress Notes (Signed)
Chief Complaint  Patient presents with  . Annual Exam    fasting annual exam with pap. Would like to discuss Qsymia-thinks she is having HA's and tooth apin and wonders if it could be from the med. She is still fat and has no drive.     Brandi Carpenter is a 50 y.o. female who presents for a complete physical.  She has the following concerns:  Depression/anxiety/obesity:  She continues on Qsymia for weight loss.  Starting weight, prior to meds, was 180#. She has gained about 5-6# since her last visit in July. In the last month she has been having increased teeth pain (including lower teeth), having recurrent headaches, with light and sound sensitivity. She can't tell me where her headaches are located.  She sometimes finds her jaws to be sore/stiff, thinks she might grinding/clenching (no mention from the dentist about grinding). Having more "sick headaches", needs to lay down in a quiet room.  She has been having a lot of allergy symptoms over the last few weeks--clear drainage.  Overall breathing is much better since her sinus surgery. She hasn't had discolored drainage from sinus rinses or any fevers.  Depression/Anxiety: She reports moods are "okay", but "I just feel "blah"". She feels fidgety, biting fingernails more, clenching her teeth. Busier at work, sometimes stressful. Denies any side effects from wellbutrin or citalopram.  Denies feeling sad, hopeless, helpless, just unmotivated. Denies SI/HI. Needing more frequent xanax, up to 2x/week. Also needing more frequent ambien--feels wound up. She falls asleep, and wakes up two hours later and can't get back to sleep. Used to use just #10/month. Denies unusual behaviors.  Stopped taking MVI about 6 months ago when she ran out.  Allergies: Under the care of allergist, is on immunotherapy (now every other week). Currently has significant drainage/PND.  Migraines--she has had botox injections in 11/2015. She then also had sinus surgery and  neck surgery.  Headaches had improved significantly after surgery, until recently.  She still notes significant improvement in neck muscle pain (still sometimes a little tight).  Seen over the summer with thrush, reports no recurrence.   Immunization History  Administered Date(s) Administered  . Influenza Split 07/14/2014, 04/25/2016  . Influenza,inj,Quad PF,36+ Mos 06/28/2013, 08/01/2015  . Tdap 05/11/2012  . Zoster 04/25/2016   Last Pap smear: 07/2015, normal, cellular changes c/w hyperkeratosis. No high risk HPV was present. Last mammogram: 08/2015 Last colonoscopy: 09/2015 with Dr. Ardis Hughs, normal.  Last DEXA: never  Dentist: twice a year Ophtho: 5 years ago  Exercise:None, "I have no drive" for the last several months (prior routine had been: strength program ([employer] training camp on the Wii) twice/week, plus walks at least a mile a day, plus yardwork.  Averaged 9K-10K) Lipids: Lab Results  Component Value Date   CHOL 180 07/30/2014   HDL 74 07/30/2014   LDLCALC 91 07/30/2014   TRIG 76 07/30/2014   CHOLHDL 2.4 07/30/2014     Past Medical History:  Diagnosis Date  . 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 Allergic rhinitis, cause unspecified   . Anxiety   . Complication of anesthesia    "difficulty awakening"  . Depression   . Headache   . Insomnia   . Vitamin D deficiency     Past Surgical History:  Procedure Laterality Date  . ANKLE SURGERY  child   left for torn ligaments, left  . ANTERIOR CERVICAL DECOMP/DISCECTOMY FUSION N/A 01/27/2016   Procedure: C5-6, C6-7  Anterior Cervical Discectomy and Fusion, Allograft, Plate;  Surgeon: Marybelle Killings, MD;  Location: [facility];  Service: Orthopedics;  Laterality: N/A;  . CERVICAL CONIZATION W/BX  2001  . NASAL SEPTOPLASTY W/ TURBINOPLASTY Bilateral 10/07/2015   Procedure: NASAL SEPTOPLASTY WITH BILATERAL TURBINATE REDUCTION;  Surgeon: Leta Baptist, MD;  Location: [facility];  Service: ENT;   Laterality: Bilateral;  . PILONIDAL CYST EXCISION      Social History   Social History  . Marital status: Single    Spouse name: N/A  . Number of children: 0  . Years of education: 53   Occupational History  . Facilities manager   Social History Main Topics  . Smoking status: Never Smoker  . Smokeless tobacco: Never Used  . Alcohol use No  . Drug use: No  . Sexual activity: No   Other Topics Concern  . Not on file   Social History Narrative   Lives with female partner, 2 beagles   Caffeine use: drinks soda 3 drinks per day    Family History  Problem Relation Age of Onset  . Hypertension Mother   . Diabetes Mother   . Heart disease Father   . Depression Sister   . Breast cancer Cousin   . Colon cancer Maternal Grandmother      died at 5  . Diabetes Maternal Grandmother   . Alzheimer's disease Paternal Grandmother   . Colon polyps Maternal Uncle   . Migraines Neg Hx     Outpatient Encounter Prescriptions as of 08/12/2016  Medication Sig Note  . buPROPion (WELLBUTRIN XL) 300 MG 24 hr tablet take 1 tablet by mouth every morning   . Cholecalciferol (VITAMIN D) 2000 UNITS tablet Take 2,000 Units by mouth daily.   . citalopram (CELEXA) 20 MG tablet take 1 tablet by mouth once daily   . fluticasone (FLONASE) 50 MCG/ACT nasal spray INSTILL 2 SPRAYS INTO EACH NOSTRIL twice a day   . levocetirizine (XYZAL) 5 MG tablet take 1 tablet by mouth every evening (Patient taking differently: Take 5 mg by mouth every evening. )   . Olopatadine HCl (PATANASE) 0.6 % SOLN Place 1-2 each into the nose 2 (two) times daily. Reported on 11/11/2015 08/12/2016: Uses prn  . OVER THE COUNTER MEDICATION Take 1 tablet by mouth 2 (two) times daily.  08/01/2015: Nature's Bounty AM/PM menopausal supplement  . PATANOL 0.1 % ophthalmic solution place 1 drop into both eyes twice a day   . QSYMIA 11.25-69 MG CP24 take 1 capsule by mouth once daily   . zolpidem (AMBIEN) 10 MG tablet take 1 tablet by  mouth at bedtime if needed for sleep 08/12/2016: Takes 1/2-1 tablet a few times/week.  Needing more recently (feels "wound up"--? From Qsymia)  . ALPRAZolam (XANAX) 0.5 MG tablet take 0.5 to 1 tablet by mouth three times a day if needed for anxiety (Patient not taking: Reported on 08/12/2016) 08/12/2016: Using a little more frequently, 1-2 times/wk, 1/2-full tablet (increased anxiety recently)  . EPIPEN 2-PAK 0.3 MG/0.3ML SOAJ injection Reported on 02/17/2016   . Multiple Vitamins-Minerals (MULTIVITAMIN PO) Take 1 tablet by mouth daily.   . [DISCONTINUED] HYDROcodone-acetaminophen (NORCO) 10-325 MG tablet Reported on 03/26/2016 02/17/2016: Received from: External Pharmacy  . [DISCONTINUED] oxyCODONE-acetaminophen (PERCOCET/ROXICET) 5-325 MG tablet Take 1-2 tablets by mouth every 6 (six) hours as needed for moderate pain. (Patient not taking: Reported on 02/17/2016)    No facility-administered encounter medications on file as of 08/12/2016.  Allergies  Allergen Reactions  . Codeine Itching and Nausea And Vomiting    ROS: The patient denies anorexia, fever, vision changes, decreased hearing, ear pain, sore throat, breast concerns, chest pain, palpitations, dizziness, syncope, dyspnea on exertion, cough (just from PND), swelling, nausea, vomiting, diarrhea, constipation, abdominal pain, melena, hematochezia, indigestion/heartburn, hematuria, incontinence, dysuria, vaginal discharge, odor or itch, genital lesions, joint pains, numbness, tingling, weakness, tremor, suspicious skin lesions, abnormal bleeding/bruising, or enlarged lymph nodes.  Periods are irregular. She had one in April, none since. Taking OTC supplement which helps with the night sweats she had been having. Not having hot flashes during the day. Lump on right foot hasn't changed, nontender Some mild discomfort in her right lower back into her buttock occasionally, not any worse than in the past. See HPI re: headaches, jaw pain, increase  in fidgeting and anxiety.  PHYSICAL EXAM:  BP 112/76 (BP Location: Left Arm, Patient Position: Sitting, Cuff Size: Normal)   Pulse 76   Ht 5' 4"  (1.626 m)   Wt 174 lb 3.2 oz (79 kg)   LMP 12/18/2015   BMI 29.90 kg/m   General Appearance:  Alert, cooperative, no distress, appears stated age.Frequent throat-clearing and occasional cough during visit  Head:  Normocephalic, without obvious abnormality, atraumatic   Eyes:  PERRL, conjunctiva/corneas clear, EOM's intact, fundi  benign   Ears:  Normal TM's and external ear canals. PE tube noted on right--seems to be protruding into canal (may not be in TM any longer)  Nose:  Nares normal, mucosa normal--no significant edema noted, but some yellow crusting is present bilaterally.  No sinus tenderness. TMJ's and temporalis muscles nontender   Throat:  Lips, mucosa, and tongue normal; teeth and gums normal   Neck:  Supple, no lymphadenopathy; thyroid: no enlargement/tenderness/nodules; no carotid bruit or JVD   Back:  Spine nontender, no curvature, ROM normal, no CVA tenderness   Lungs:  Clear to auscultation bilaterally without wheezes, rales or ronchi; respirations unlabored   Chest Wall:  No tenderness or deformity   Heart:  Regular rate and rhythm, S1 and S2 normal, no murmur, rub or gallop   Breast Exam:  No tenderness, masses, or nipple discharge or inversion. No axillary lymphadenopathy. Some fibroglandular changes bilaterally, no dominant masses  Abdomen:  Soft, non-tender, nondistended, normoactive bowel sounds,  no masses, no hepatosplenomegaly   Genitalia:  Normal external genitalia without lesions. BUS and vagina normal; nulliparous cervix without lesions; no cervical motion tenderness. No abnormal vaginal discharge. Uterus and adnexa not enlarged, nontender, no masses. Pap performed.   Rectal:  Normal tone, no masses or tenderness; Small rectocele noted. Stool is heme  negative  Extremities:  No clubbing, cyanosis or edema   Pulses:  2+ and symmetric all extremities   Skin:  Skin color, texture, turgor normal, no rashes.  Lymph nodes:  Cervical, supraclavicular, and axillary nodes normal   Neurologic:  CNII-XII intact, normal strength, sensation and gait; reflexes 2+ and symmetric throughout    Psych:   Normal mood, affect (slightly flat today), hygiene and grooming   ASSESSMENT/PLAN:  Annual physical exam - Plan: POCT Urinalysis Dipstick, Visual acuity screening, Lipid panel, Comprehensive metabolic panel, CBC with Differential/Platelet, VITAMIN D 25 Hydroxy (Vit-D Deficiency, Fractures), TSH, Follicle Stimulating Hormone, Cytology - PAP [place]  Depression, major, in remission ([place]) - some agitation and being unmotivated--encouraged exercise; continue current meds. Check labs  Obesity (BMI 30-39.9) - weight gain despite meds; not exercising  ?side effects .Can try lower dose; Encouraged  daily exercise - Plan: Phentermine-Topiramate 7.5-46 MG CP24  Anxiety state - discussed relaxation techniques; xanax prn; ?if meds contributing--lower dose of Qsymia - Plan: ALPRAZolam (XANAX) 0.5 MG tablet  Migraine without aura and without status migrainosus, not intractable - re-try nortriptylene.  risks/side effects reviewed - Plan: nortriptyline (PAMELOR) 10 MG capsule  Other fatigue - Plan: Comprehensive metabolic panel, CBC with Differential/Platelet, VITAMIN D 25 Hydroxy (Vit-D Deficiency, Fractures), TSH  Irregular menses - peri-menopausal (vs post) likely. check labs - Plan: TSH, Follicle Stimulating Hormone   Discussed monthly self breast exams and yearly mammograms; at least 30 minutes of aerobic activity at least 5 days/week, weight-bearing exercise at least 2x/wk; proper sunscreen use reviewed; healthy diet, including goals of calcium and vitamin D intake and alcohol recommendations (less than or equal to 1  drink/day) reviewed; regular seatbelt use; changing batteries in smoke detectors. Immunization recommendations discussed--UTD; continue yearly flu shots.  Consider Shingrix in future. Colonoscopy recommendations reviewed--UTD Advised to schedule routine eye exam.  CBC, TSH, vit D, c-met, lipids, FSH  Migraines--restart nortriptylene and f/u in 6 weeks. ?if headaches, agitation, anxiety related to Qsymia. Discussed trial of lower dose--7.5/46--if not different can remain at higher dose, vs decide to taper off entirely (seeing as it hasn't been very effective). Weight gain likely related to lack of exercise (and worsening moods). Counseled extensively re: stress reduction, exercise, wellness.   F/u 6 weeks  At least half this hour-long visit was spent counseling (at least 20 mins not related to wellness issues)

## 2016-08-12 ENCOUNTER — Other Ambulatory Visit (HOSPITAL_COMMUNITY)
Admission: RE | Admit: 2016-08-12 | Discharge: 2016-08-12 | Disposition: A | Discharge status: Home / Self Care | Payer: BC Managed Care – PPO | Source: Ambulatory Visit | Attending: Family Medicine | Admitting: Family Medicine

## 2016-08-12 ENCOUNTER — Ambulatory Visit (INDEPENDENT_AMBULATORY_CARE_PROVIDER_SITE_OTHER): Payer: BC Managed Care – PPO | Admitting: Family Medicine

## 2016-08-12 ENCOUNTER — Encounter: Payer: Self-pay | Admitting: Family Medicine

## 2016-08-12 VITALS — BP 112/76 | HR 76 | Ht 64.0 in | Wt 174.2 lb

## 2016-08-12 DIAGNOSIS — Z1151 Encounter for screening for human papillomavirus (HPV): Secondary | ICD-10-CM | POA: Insufficient documentation

## 2016-08-12 DIAGNOSIS — Z0000 Encounter for general adult medical examination without abnormal findings: Secondary | ICD-10-CM

## 2016-08-12 DIAGNOSIS — F411 Generalized anxiety disorder: Secondary | ICD-10-CM

## 2016-08-12 DIAGNOSIS — R5383 Other fatigue: Secondary | ICD-10-CM

## 2016-08-12 DIAGNOSIS — Z01419 Encounter for gynecological examination (general) (routine) without abnormal findings: Secondary | ICD-10-CM | POA: Diagnosis present

## 2016-08-12 DIAGNOSIS — N926 Irregular menstruation, unspecified: Secondary | ICD-10-CM | POA: Diagnosis not present

## 2016-08-12 DIAGNOSIS — E669 Obesity, unspecified: Secondary | ICD-10-CM

## 2016-08-12 DIAGNOSIS — G43009 Migraine without aura, not intractable, without status migrainosus: Secondary | ICD-10-CM

## 2016-08-12 DIAGNOSIS — F325 Major depressive disorder, single episode, in full remission: Secondary | ICD-10-CM

## 2016-08-12 LAB — CBC WITH DIFFERENTIAL/PLATELET
BASOS ABS: 0 {cells}/uL (ref 0–200)
BASOS PCT: 0 %
EOS PCT: 7 %
Eosinophils Absolute: 490 cells/uL (ref 15–500)
HCT: 42 % (ref 35.0–45.0)
HEMOGLOBIN: 13.6 g/dL (ref 11.7–15.5)
LYMPHS ABS: 2100 {cells}/uL (ref 850–3900)
Lymphocytes Relative: 30 %
MCH: 30 pg (ref 27.0–33.0)
MCHC: 32.4 g/dL (ref 32.0–36.0)
MCV: 92.5 fL (ref 80.0–100.0)
MPV: 10.7 fL (ref 7.5–12.5)
Monocytes Absolute: 700 cells/uL (ref 200–950)
Monocytes Relative: 10 %
NEUTROS ABS: 3710 {cells}/uL (ref 1500–7800)
Neutrophils Relative %: 53 %
Platelets: 305 10*3/uL (ref 140–400)
RBC: 4.54 MIL/uL (ref 3.80–5.10)
RDW: 13.2 % (ref 11.0–15.0)
WBC: 7 10*3/uL (ref 4.0–10.5)

## 2016-08-12 LAB — LIPID PANEL
CHOL/HDL RATIO: 3 ratio (ref <5.0)
CHOLESTEROL: 228 mg/dL — AB (ref <200)
HDL: 75 mg/dL (ref >50)
LDL Cholesterol: 125 mg/dL — ABNORMAL HIGH (ref <100)
TRIGLYCERIDES: 141 mg/dL (ref <150)
VLDL: 28 mg/dL (ref <30)

## 2016-08-12 LAB — COMPREHENSIVE METABOLIC PANEL
ALK PHOS: 68 U/L (ref 33–130)
ALT: 21 U/L (ref 6–29)
AST: 19 U/L (ref 10–35)
Albumin: 4.7 g/dL (ref 3.6–5.1)
BUN: 12 mg/dL (ref 7–25)
CALCIUM: 9.7 mg/dL (ref 8.6–10.4)
CO2: 26 mmol/L (ref 20–31)
Chloride: 105 mmol/L (ref 98–110)
Creat: 1.03 mg/dL (ref 0.50–1.05)
GLUCOSE: 80 mg/dL (ref 65–99)
POTASSIUM: 4.2 mmol/L (ref 3.5–5.3)
Sodium: 138 mmol/L (ref 135–146)
Total Bilirubin: 0.6 mg/dL (ref 0.2–1.2)
Total Protein: 7.6 g/dL (ref 6.1–8.1)

## 2016-08-12 LAB — POCT URINALYSIS DIPSTICK
Bilirubin, UA: NEGATIVE
GLUCOSE UA: NEGATIVE
Ketones, UA: NEGATIVE
LEUKOCYTES UA: NEGATIVE
NITRITE UA: NEGATIVE
Protein, UA: NEGATIVE
RBC UA: NEGATIVE
Spec Grav, UA: 1.01
UROBILINOGEN UA: NEGATIVE
pH, UA: 6

## 2016-08-12 LAB — TSH: TSH: 0.92 m[IU]/L

## 2016-08-12 MED ORDER — NORTRIPTYLINE HCL 10 MG PO CAPS: ORAL_CAPSULE | ORAL | 3 refills | Status: DC

## 2016-08-12 MED ORDER — ALPRAZOLAM 0.5 MG PO TABS: ORAL_TABLET | ORAL | 3 refills | Status: DC

## 2016-08-12 MED ORDER — PHENTERMINE-TOPIRAMATE ER 7.5-46 MG PO CP24: 1.0000 | ORAL_CAPSULE | Freq: Every day | ORAL | 2 refills | Status: DC

## 2016-08-12 NOTE — Patient Instructions (Signed)
  HEALTH MAINTENANCE RECOMMENDATIONS:  It is recommended that you get at least 30 minutes of aerobic exercise at least 5 days/week (for weight loss, you may need as much as 60-90 minutes). This can be any activity that gets your heart rate up. This can be divided in 10-15 minute intervals if needed, but try and build up your endurance at least once a week.  Weight bearing exercise is also recommended twice weekly.  Eat a healthy diet with lots of vegetables, fruits and fiber.  "Colorful" foods have a lot of vitamins (ie green vegetables, tomatoes, red peppers, etc).  Limit sweet tea, regular sodas and alcoholic beverages, all of which has a lot of calories and sugar.  Up to 1 alcoholic drink daily may be beneficial for women (unless trying to lose weight, watch sugars).  Drink a lot of water.  Calcium recommendations are 1200-1500 mg daily (1500 mg for postmenopausal women or women without ovaries), and vitamin D 1000 IU daily.  This should be obtained from diet and/or supplements (vitamins), and calcium should not be taken all at once, but in divided doses.  Monthly self breast exams and yearly mammograms for women over the age of 30 is recommended.  Sunscreen of at least SPF 30 should be used on all sun-exposed parts of the skin when outside between the hours of 10 am and 4 pm (not just when at beach or pool, but even with exercise, golf, tennis, and yard work!)  Use a sunscreen that says "broad spectrum" so it covers both UVA and UVB rays, and make sure to reapply every 1-2 hours.  Remember to change the batteries in your smoke detectors when changing your clock times in the spring and fall.  Use your seat belt every time you are in a car, and please drive safely and not be distracted with cell phones and texting while driving.  Please schedule eye exam and mammogram. Please resume a regular exercise routine as discussed.  Try the lower dose of Qsymia and see if you feel any better.  If not,  you can decide if you want a trial of going off entirely, vs going back up to the higher dose if you find you need the medication to help with weight/appetite.  Start taking the nortriptylene at bedtime.  Start with 1 capsules, and increase to 2 after a week if not having any side effects. This should eventually help with headaches, as well as with sleep.  Return in 6 weeks for [place] to re-address your symptoms and medications.

## 2016-08-13 LAB — VITAMIN D 25 HYDROXY (VIT D DEFICIENCY, FRACTURES): VIT D 25 HYDROXY: 59 ng/mL (ref 30–100)

## 2016-08-13 LAB — FOLLICLE STIMULATING HORMONE: FSH: 98.8 m[IU]/mL

## 2016-08-16 ENCOUNTER — Other Ambulatory Visit: Payer: Self-pay | Admitting: Family Medicine

## 2016-08-17 ENCOUNTER — Other Ambulatory Visit: Payer: Self-pay | Admitting: Family Medicine

## 2016-08-17 DIAGNOSIS — J309 Allergic rhinitis, unspecified: Secondary | ICD-10-CM

## 2016-08-17 LAB — CYTOLOGY - PAP
DIAGNOSIS: NEGATIVE
HPV (WINDOPATH): NOT DETECTED

## 2016-08-17 NOTE — Telephone Encounter (Signed)
Is this okay to refill? Looks like you did the lower dose at visit on 11/29.

## 2016-08-17 NOTE — Telephone Encounter (Signed)
Patient would like the higher dose,I called in.

## 2016-08-17 NOTE — Telephone Encounter (Signed)
Please check with the patient.  If she prefers to stay on the higher dose, okay to refill x 3 months (shouldn't be taking both)

## 2016-09-21 ENCOUNTER — Other Ambulatory Visit: Payer: Self-pay | Admitting: Family Medicine

## 2016-09-21 NOTE — Telephone Encounter (Signed)
Called in to [facility]

## 2016-09-21 NOTE — Telephone Encounter (Signed)
Ok for #30 with 1 refill 

## 2016-09-22 NOTE — Progress Notes (Signed)
Chief Complaint  Patient presents with  . Follow-up    6 week follow up on nortrip, stopped taking after 2.5 weeks-ears were ringing nonstop. Can you look in her right ear and she also thinks she may be getting trush again.     Patient presents to follow-up on migraines.  Nortriptylene was started at her last visit 6 weeks ago. At that time, she reported having increased teeth pain (including lower teeth), having recurrent headaches, with light and sound sensitivity. She was having some soreness/stiffness in the jaw, thinks she might grinding/clenching (no mention from the dentist about grinding). She was having more "sick headaches", needs to lay down in a quiet room.  She had also reported  "I just feel "blah", feeling fidgety, biting fingernails more, clenching her teeth.  She was also needing more frequent xanax, up to 2x/week and more frequent ambien.  Depression/anxiety was overall stable on her medication regimen.  She took nortriptylene for 2.5 weeks, had constant ear ringing.  Hadn't remembered that was why it was stopped when she previously took it, but she found old email to Dr. Lavell Anchors stating she had the same problem with the medication. Jaw tightness, and "sick headaches" are much less frequent than at last visit. She continues to need the [place] about 4-5x/week (1/2 tablet). Hasn't needed any xanax in a couple of weeks.  Some recurrent raw mouth, wondering if she might have recurrent thrush. Symptoms for a couple of weeks, feels more irritated. Body has been itchy (hands, feet, face), eyes have been itching. Face has sensitive to lotions.  On Qsymia--last visit, when she was having some increased agitation and headaches we suggested cutting back the dose for a bit to see if that contributed.  She never decreased the dose, and those symptoms are improved. She has not, however, had any further weight loss. She is pleased that the Qsymia is keeping her from gaining weight.  She admits that  she is afraid of not taking Qsymia for fear her weight will balloon back up.  PMH, PSH, SH reviewed  Outpatient Encounter Prescriptions as of 09/24/2016  Medication Sig Note  . ALPRAZolam (XANAX) 0.5 MG tablet take 0.5 to 1 tablet by mouth three times a day if needed for anxiety 09/24/2016: Uses prn, hasn't used in a couple of weeks  . buPROPion (WELLBUTRIN XL) 300 MG 24 hr tablet take 1 tablet by mouth every morning   . Cholecalciferol (VITAMIN D) 2000 UNITS tablet Take 2,000 Units by mouth daily.   . citalopram (CELEXA) 20 MG tablet take 1 tablet by mouth once daily   . fluticasone (FLONASE) 50 MCG/ACT nasal spray INSTILL 2 SPRAYS INTO EACH NOSTRIL twice a day   . levocetirizine (XYZAL) 5 MG tablet take 1 tablet by mouth every evening   . Olopatadine HCl (PATANASE) 0.6 % SOLN Place 1-2 each into the nose 2 (two) times daily. Reported on 11/11/2015 08/12/2016: Uses prn  . OVER THE COUNTER MEDICATION Take 1 tablet by mouth 2 (two) times daily.  08/01/2015: Nature's Bounty AM/PM menopausal supplement  . PATANOL 0.1 % ophthalmic solution place 1 drop into both eyes twice a day   . QSYMIA 11.25-69 MG CP24 take 1 capsule by mouth once daily   . zolpidem (AMBIEN) 10 MG tablet take 1 tablet by mouth at bedtime if needed   . CIPRODEX otic suspension 4 drops 2 (two) times daily.  09/24/2016: Received from: External Pharmacy  . EPIPEN 2-PAK 0.3 MG/0.3ML SOAJ injection Reported on 02/17/2016   .  nystatin (MYCOSTATIN) 100000 UNIT/ML suspension Take 5 mLs (500,000 Units total) by mouth 4 (four) times daily.   . [DISCONTINUED] Multiple Vitamins-Minerals (MULTIVITAMIN PO) Take 1 tablet by mouth daily.   . [DISCONTINUED] nortriptyline (PAMELOR) 10 MG capsule Take 1 capsule at bedtime for a week.  Increase to 2 capsules if tolerating. (Patient not taking: Reported on 09/24/2016)   . [DISCONTINUED] Phentermine-Topiramate 7.5-46 MG CP24 Take 1 capsule by mouth daily. (Patient not taking: Reported on 09/24/2016)    No  facility-administered encounter medications on file as of 09/24/2016.    Allergies  Allergen Reactions  . Codeine Itching and Nausea And Vomiting  . Pamelor [Nortriptyline] Tinitus   ROS: no fever, chills, URI symptoms.  +mouth irritation, itching as per HPI.  Depression/anxiety controlled.  Headaches less frequent.  +insomnia.  No GI complaints.  PHYSICAL EXAM:  BP 110/82 (BP Location: Left Arm, Patient Position: Sitting, Cuff Size: Normal)   Pulse 72   Ht 5\' 4"  (1.626 m)   Wt 177 lb 3.2 oz (80.4 kg)   LMP 12/18/2015   BMI 30.42 kg/m   Well developed, pleasant female, in good spirits, in no distress HEENT: TM's and EAC's normal.  OP--two small spots (61mm) that are on the soft palate just to the right of midline that are raised, slightly white.  No other abnormalities noted in her areas of irritation (along the interior gumline or elsewhere). No sinus tenderness, no TMJ tenderness. Neck: no lymphadenopathy, thyromegaly or mass Heart: regular rate and rhythm Lungs: clear bilaterally Extremities: no edema Psych: normal mood, affect, hygiene and grooming Neuro: alert and oriented, cranial nerves intact, normal gait, strength  ASSESSMENT/PLAN:  Oral candidiasis - oral nystatin; if mouth sensitivity persists, change to Magic Mouthwash; if worsening thrush, change to oral diflucan. Proper flonase use reviewed - Plan: nystatin (MYCOSTATIN) 100000 UNIT/ML suspension  Migraine without aura and without status migrainosus, not intractable - improved  Insomnia, unspecified type - increased freq of needing med. Discussed safety/recommendation for ambien vs CR for chronic/long-term use (at lower dose, 6.25)  Obesity (BMI 30-39.9) - not losing weight with Qsymia--discussed lower doses usually used for maintenance/long-term. She has rx for lower dose (from last visit) and will try this  Depression, major, in remission ([place])   Discussed maintenance doses of Qysmia Plans to finish out current  dose and fill lower dose after for maintenance  Discussed ambien--short-term vs longterm. Consider change to 6.25 of the CR if truly needing almost daily, long-term  Use Nystatin 3-4 times daily for mild thrush. Be sure to rinse/gargle your mouth if you are sniffing the flonase too hard (tasting/swallowing). Try and just to gentle sniffs.  Call next week for change to magic mouthwash if ongoing mouth discomfort. (if large white patches, call for diflucan pill rather than magic mouthwash).

## 2016-09-24 ENCOUNTER — Encounter: Payer: Self-pay | Admitting: Family Medicine

## 2016-09-24 ENCOUNTER — Ambulatory Visit (INDEPENDENT_AMBULATORY_CARE_PROVIDER_SITE_OTHER): Payer: BC Managed Care – PPO | Admitting: Family Medicine

## 2016-09-24 VITALS — BP 110/82 | HR 72 | Ht 64.0 in | Wt 177.2 lb

## 2016-09-24 DIAGNOSIS — F325 Major depressive disorder, single episode, in full remission: Secondary | ICD-10-CM

## 2016-09-24 DIAGNOSIS — G43009 Migraine without aura, not intractable, without status migrainosus: Secondary | ICD-10-CM | POA: Diagnosis not present

## 2016-09-24 DIAGNOSIS — E669 Obesity, unspecified: Secondary | ICD-10-CM

## 2016-09-24 DIAGNOSIS — B370 Candidal stomatitis: Secondary | ICD-10-CM

## 2016-09-24 DIAGNOSIS — G4700 Insomnia, unspecified: Secondary | ICD-10-CM | POA: Diagnosis not present

## 2016-09-24 MED ORDER — NYSTATIN 100000 UNIT/ML MT SUSP: 5.0000 mL | Freq: Four times a day (QID) | OROMUCOSAL | 0 refills | Status: DC

## 2016-09-24 NOTE — Patient Instructions (Signed)
  Use Nystatin 3-4 times daily for mild thrush. Be sure to rinse/gargle your mouth if you are sniffing the flonase too hard (tasting/swallowing). Try and just to gentle sniffs.  Call next week for change to magic mouthwash if ongoing mouth discomfort. (if large white patches, call for diflucan pill rather than magic mouthwash).

## 2016-09-25 ENCOUNTER — Encounter: Payer: Self-pay | Admitting: Family Medicine

## 2016-10-21 ENCOUNTER — Other Ambulatory Visit: Payer: Self-pay | Admitting: Family Medicine

## 2016-10-21 DIAGNOSIS — F325 Major depressive disorder, single episode, in full remission: Secondary | ICD-10-CM

## 2016-12-23 ENCOUNTER — Other Ambulatory Visit: Payer: Self-pay | Admitting: Family Medicine

## 2016-12-23 NOTE — Telephone Encounter (Signed)
Is this okay to refill? 

## 2016-12-23 NOTE — Telephone Encounter (Signed)
Ok to refill #30

## 2017-01-24 ENCOUNTER — Other Ambulatory Visit: Payer: Self-pay | Admitting: Family Medicine

## 2017-01-24 DIAGNOSIS — F325 Major depressive disorder, single episode, in full remission: Secondary | ICD-10-CM

## 2017-01-24 DIAGNOSIS — J309 Allergic rhinitis, unspecified: Secondary | ICD-10-CM

## 2017-01-25 NOTE — Telephone Encounter (Signed)
Are these okay or does she need appt?

## 2017-01-25 NOTE — Telephone Encounter (Signed)
She needs to schedule her annual physical for late November/early December.  Okay to fill until then

## 2017-02-11 ENCOUNTER — Other Ambulatory Visit: Payer: Self-pay | Admitting: Family Medicine

## 2017-02-11 NOTE — Telephone Encounter (Signed)
Ok for #30 

## 2017-02-11 NOTE — Telephone Encounter (Signed)
Is this okay to refill? 

## 2017-03-02 ENCOUNTER — Other Ambulatory Visit: Payer: Self-pay | Admitting: Family Medicine

## 2017-03-02 DIAGNOSIS — J309 Allergic rhinitis, unspecified: Secondary | ICD-10-CM

## 2017-03-02 NOTE — Telephone Encounter (Signed)
Is this okay to refill? 

## 2017-04-24 ENCOUNTER — Other Ambulatory Visit: Payer: Self-pay | Admitting: Family Medicine

## 2017-04-26 NOTE — Telephone Encounter (Signed)
Last filled 5/31.  Okay to refill #30

## 2017-04-26 NOTE — Telephone Encounter (Signed)
Is this okay to call in? 

## 2017-05-24 ENCOUNTER — Other Ambulatory Visit: Payer: Self-pay | Admitting: Family Medicine

## 2017-05-24 DIAGNOSIS — F411 Generalized anxiety disorder: Secondary | ICD-10-CM

## 2017-05-24 NOTE — Telephone Encounter (Signed)
Ok to refill without add'l refills.

## 2017-07-05 ENCOUNTER — Other Ambulatory Visit: Payer: Self-pay | Admitting: Family Medicine

## 2017-07-06 NOTE — Telephone Encounter (Signed)
Last filled 8/13, has upcoming appt in December. Okay to refill #30, no add'l refill. Thanks

## 2017-07-06 NOTE — Telephone Encounter (Signed)
Can pt have a refill on this 

## 2017-07-19 ENCOUNTER — Other Ambulatory Visit: Payer: Self-pay | Admitting: Family Medicine

## 2017-07-19 DIAGNOSIS — F325 Major depressive disorder, single episode, in full remission: Secondary | ICD-10-CM

## 2017-08-17 NOTE — Progress Notes (Signed)
Chief Complaint  Patient presents with  . Annual Exam    nonfasting annual exam with pelvic. Had eye exam this year. Weight gain, stopped taking Qsymia and she is gaining. Was having nervousness, anxiety and sleep issues.     Brandi Carpenter is a 51 y.o. female who presents for a complete physical.  She has the following concerns:  She stopped Qsymia (last prescribed 08/2016 x 3 mos), wasn't effective. Causing some nervousness and affecting sleep. Starting weight, prior to meds, was 180#.  She reports being very unmotivated to exercise, lose weight.  Depression/Anxiety:  Moods are stable. Denies any side effects from wellbutrin or citalopram. Denies feeling sad, hopeless, helpless.  Feels unmotivated with respect to diet and exercise, feels somewhat helpless with regard to weight loss only, no other aspects of her life. Denies SI/HI. Last refilled alprazolam #20 in September.  Legs have been jumpy at night, and xanax seems to be more effective (1/2 at bedtime), in place of [place]. The ambien helps her sleep, but doesn't stop the jumpy legs.   She previously discussed restless legs with Dr. Ambrose Mantle been an issue again until recently, seems to come and go. Uses ambien prn insomnia, needing it more often than in the past, about 3-4 times/week (sometimes during the week, not on the weekends). Doesn't take both [place] and xanax together.  She falls asleep, and wakes up two hours later and can't get back to sleep. Usually no problems falling asleep. Denies unusual behaviors. If she doesn't take [place], she may get up and eat when she is having trouble sleeping.  Allergies: Under the care of allergist, is on immunotherapy (now every other week).   Migraines--she has had botox injections in 11/2015. She then also had sinus surgery and neck surgery.  Headaches had improved significantly after surgery. She had recurrent headaches at her physical last year, with jaw clenching.  She was  started back on nortryptilene, which she had to stop due to tinnitus. At her f/u in January, her headaches had improved back to baseline.  Doing well now, headaches aren't frequent.  Now having teeth pain, not related to her braces.  Jaw isn't sore; is aware that she clenches her teeth. She got braces, as the dentist suggested that it may help with her pain.  Been on since July.  Seen last in January with thrush, reports no recurrence.   Immunization History  Administered Date(s) Administered  . Influenza Split 07/14/2014, 04/25/2016  . Influenza,inj,Quad PF,6+ Mos 06/28/2013, 08/01/2015  . Tdap 05/11/2012  . Zoster 04/25/2016   Last Pap smear: 07/2016, normal, no high risk HPV was present. Last mammogram: 08/2015 Last colonoscopy: 09/2015 with Dr. Ardis Hughs, normal.  Last DEXA: never  Dentist: twice a year Ophtho: last year  Exercise:None  Lipids: Lab Results  Component Value Date   CHOL 228 (H) 08/12/2016   HDL 75 08/12/2016   LDLCALC 125 (H) 08/12/2016   TRIG 141 08/12/2016   CHOLHDL 3.0 08/12/2016   Vitamin D-OH 59 in 07/2016  Past Medical History:  Diagnosis Date  . 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 Allergic rhinitis, cause unspecified   . Anxiety   . Complication of anesthesia    "difficulty awakening"  . Depression   . Headache   . Insomnia   . Vitamin D deficiency     Past Surgical History:  Procedure Laterality Date  . ANKLE SURGERY  child   left for torn ligaments, left  .  ANTERIOR CERVICAL DECOMP/DISCECTOMY FUSION N/A 01/27/2016   Procedure: C5-6, C6-7 Anterior Cervical Discectomy and Fusion, Allograft, Plate;  Surgeon: Marybelle Killings, MD;  Location: [facility];  Service: Orthopedics;  Laterality: N/A;  . CERVICAL CONIZATION W/BX  2001  . NASAL SEPTOPLASTY W/ TURBINOPLASTY Bilateral 10/07/2015   Procedure: NASAL SEPTOPLASTY WITH BILATERAL TURBINATE REDUCTION;  Surgeon: Leta Baptist, MD;  Location: [facility];   Service: ENT;  Laterality: Bilateral;  . PILONIDAL CYST EXCISION      Social History   Socioeconomic History  . Marital status: Single    Spouse name: Not on file  . Number of children: 0  . Years of education: 39  . Highest education level: Not on file  Social Needs  . Financial resource strain: Not on file  . Food insecurity - worry: Not on file  . Food insecurity - inability: Not on file  . Transportation needs - medical: Not on file  . Transportation needs - non-medical: Not on file  Occupational History  . Occupation: Naval architect: [employer]  Tobacco Use  . Smoking status: Never Smoker  . Smokeless tobacco: Never Used  Substance and Sexual Activity  . Alcohol use: No    Alcohol/week: 0.0 oz  . Drug use: No  . Sexual activity: No    Partners: Female    Birth control/protection: Abstinence  Other Topics Concern  . Not on file  Social History Narrative   Lives with female partner Juliann Pulse), 2 beagles   Caffeine use: drinks soda 3 drinks per day    Family History  Problem Relation Age of Onset  . Hypertension Mother   . Diabetes Mother   . Heart disease Father   . Depression Sister   . Breast cancer Cousin   . Colon cancer Maternal Grandmother         died at 45  . Diabetes Maternal Grandmother   . Alzheimer's disease Paternal Grandmother   . Colon polyps Maternal Uncle   . Migraines Neg Hx     Outpatient Encounter Medications as of 08/18/2017  Medication Sig Note  . ALPRAZolam (XANAX) 0.5 MG tablet take 1/2 to 1 tablet by mouth three times a day if needed for anxiety   . buPROPion (WELLBUTRIN XL) 300 MG 24 hr tablet take 1 tablet by mouth every morning   . Cholecalciferol (VITAMIN D) 2000 UNITS tablet Take 2,000 Units by mouth daily.   . citalopram (CELEXA) 20 MG tablet take 1 tablet by mouth once daily   . fluticasone (FLONASE) 50 MCG/ACT nasal spray INSTILL 2 SPRAYS INTO EACH NOSTRIL twice a day   . levocetirizine (XYZAL) 5 MG tablet take 1 tablet  by mouth every evening   . olopatadine (PATANOL) 0.1 % ophthalmic solution place 1 drop into both eyes twice a day   . Olopatadine HCl (PATANASE) 0.6 % SOLN Place 1-2 each into the nose 2 (two) times daily. Reported on 11/11/2015 08/12/2016: Uses prn  . OVER THE COUNTER MEDICATION Take 1 tablet by mouth 2 (two) times daily.  08/01/2015: Nature's Bounty AM/PM menopausal supplement  . [DISCONTINUED] zolpidem (AMBIEN) 10 MG tablet take 1 tablet by mouth at bedtime if needed for sleep 08/18/2017: Takes 1/2 tablet, more often than in the past, 3-4 times/week or more  . EPIPEN 2-PAK 0.3 MG/0.3ML SOAJ injection Reported on 02/17/2016   . zolpidem (AMBIEN CR) 6.25 MG CR tablet Take 1-2 tablets (6.25-12.5 mg total) by mouth at bedtime as needed for sleep.   . [  DISCONTINUED] CIPRODEX otic suspension 4 drops 2 (two) times daily.  09/24/2016: Received from: External Pharmacy  . [DISCONTINUED] nystatin (MYCOSTATIN) 100000 UNIT/ML suspension Take 5 mLs (500,000 Units total) by mouth 4 (four) times daily.   . [DISCONTINUED] QSYMIA 11.25-69 MG CP24 take 1 capsule by mouth once daily    No facility-administered encounter medications on file as of 08/18/2017.     Allergies  Allergen Reactions  . Codeine Itching and Nausea And Vomiting  . Pamelor [Nortriptyline] Tinitus    ROS: The patient denies anorexia, fever, vision changes, decreased hearing, ear pain, sore throat, breast concerns, chest pain, palpitations, dizziness, syncope, dyspnea on exertion, cough, swelling, nausea, vomiting, diarrhea, constipation, abdominal pain, melena, hematochezia, indigestion/heartburn, hematuria, incontinence, dysuria, vaginal discharge, odor or itch, genital lesions, joint pains, numbness, tingling, weakness, tremor, suspicious skin lesions, abnormal bleeding/bruising, or enlarged lymph nodes.  Irregular menses, last was in February 2018, the one prior was in October 2017. Taking OTC supplement which helps with the night sweats she  had been having. They had gone away, but recently started coming back again. Lump on right foot hasn't changed, nontender Some mild discomfort in her right lower back into her buttock occasionally, not any worse than in the past.  Weight gain of 8# since her last physical.  PHYSICAL EXAM:  BP 120/86   Pulse 80   Ht 5\' 4"  (1.626 m)   Wt 182 lb (82.6 kg)   LMP 01/06/2016   BMI 31.24 kg/m   Wt Readings from Last 3 Encounters:  08/18/17 182 lb (82.6 kg)  09/24/16 177 lb 3.2 oz (80.4 kg)  08/12/16 174 lb 3.2 oz (79 kg)    General Appearance:  Alert, cooperative, no distress, appears stated age.  Head:  Normocephalic, without obvious abnormality, atraumatic   Eyes:  PERRL, conjunctiva/corneas clear, EOM's intact, fundi benign   Ears:  Normal TM's and external ear canals. PE tube noted on right--at least partially extruded from TM (white)  Nose:  Nares normal, mucosa normal--no significant edema noted.  No sinus tenderness. TMJ's and temporalis muscles nontender   Throat:  Lips, mucosa, and tongue normal; teeth and gums normal   Neck:  Supple, no lymphadenopathy; thyroid: no enlargement/tenderness/nodules; no carotid bruit or JVD   Back:  Spine nontender, no curvature, ROM normal, no CVA tenderness   Lungs:  Clear to auscultation bilaterally without wheezes, rales or ronchi; respirations unlabored   Chest Wall:  No tenderness or deformity   Heart:  Regular rate and rhythm, S1 and S2 normal, no murmur, rub or gallop   Breast Exam:  No tenderness, masses, or nipple discharge or inversion. No axillary lymphadenopathy. Some fibroglandular changes bilaterally, no dominant masses  Abdomen:  Soft, non-tender, nondistended, normoactive bowel sounds, no masses, no hepatosplenomegaly   Genitalia:  Normal external genitalia without lesions. BUS and vagina normal; no cervical motion tenderness. No abnormal vaginal discharge. Uterus and adnexa not  enlarged, nontender, no masses. Pap not performed.   Rectal:  Normal tone, no masses or tenderness; Small rectocele noted. Stool is heme negative  Extremities:  No clubbing, cyanosis or edema   Pulses:  2+ and symmetric all extremities   Skin:  Skin color, texture, turgor normal, no rashes.  Lymph nodes:  Cervical, supraclavicular, and axillary nodes normal   Neurologic:  CNII-XII intact, normal strength, sensation and gait; reflexes 2+ and symmetric throughout    Psych:   Normal mood, affect, hygiene and grooming   ASSESSMENT/PLAN:  Annual physical exam - Plan: CBC  with Differential/Platelet, Lipid panel, TSH, Comprehensive metabolic panel, POCT Urinalysis DIP (Proadvantage Device)  Depression, major, in remission (HCC)  Insomnia, unspecified type - reviewed sleep hygiene/risks/side effects of medications. We have never previously discussed RLS, may need to consider treating - Plan: zolpidem (AMBIEN CR) 6.25 MG CR tablet  Anxiety state  Migraine without aura and without status migrainosus, not intractable - significantly improved  Counseled re: diet, exercise, portion control.  Discussed monthly self breast exams and yearly mammograms (past due); at least 30 minutes of aerobic activity at least 5 days/week, weight-bearing exercise at least 2x/wk; proper sunscreen use reviewed; healthy diet, including goals of calcium and vitamin D intake and alcohol recommendations (less than or equal to 1 drink/day) reviewed; regular seatbelt use; changing batteries in smoke detectors. Immunization recommendations discussed--UTD; continue yearly flu shots.  Shingrix recommended. Colonoscopy recommendations reviewed--UTD

## 2017-08-18 ENCOUNTER — Ambulatory Visit: Payer: BC Managed Care – PPO | Admitting: Family Medicine

## 2017-08-18 ENCOUNTER — Encounter: Payer: Self-pay | Admitting: Family Medicine

## 2017-08-18 VITALS — BP 120/86 | HR 80 | Ht 64.0 in | Wt 182.0 lb

## 2017-08-18 DIAGNOSIS — Z0000 Encounter for general adult medical examination without abnormal findings: Secondary | ICD-10-CM | POA: Diagnosis not present

## 2017-08-18 DIAGNOSIS — G4700 Insomnia, unspecified: Secondary | ICD-10-CM | POA: Diagnosis not present

## 2017-08-18 DIAGNOSIS — G43009 Migraine without aura, not intractable, without status migrainosus: Secondary | ICD-10-CM

## 2017-08-18 DIAGNOSIS — F325 Major depressive disorder, single episode, in full remission: Secondary | ICD-10-CM | POA: Diagnosis not present

## 2017-08-18 DIAGNOSIS — F411 Generalized anxiety disorder: Secondary | ICD-10-CM

## 2017-08-18 LAB — POCT URINALYSIS DIP (PROADVANTAGE DEVICE)
Bilirubin, UA: NEGATIVE
GLUCOSE UA: NEGATIVE mg/dL
Ketones, POC UA: NEGATIVE mg/dL
LEUKOCYTES UA: NEGATIVE
NITRITE UA: NEGATIVE
PH UA: 6 (ref 5.0–8.0)
Protein Ur, POC: NEGATIVE mg/dL
RBC UA: NEGATIVE
Specific Gravity, Urine: 1.015
UUROB: NEGATIVE

## 2017-08-18 MED ORDER — ZOLPIDEM TARTRATE ER 6.25 MG PO TBCR: 6.2500 mg | EXTENDED_RELEASE_TABLET | Freq: Every evening | ORAL | 2 refills | Status: DC | PRN

## 2017-08-18 NOTE — Patient Instructions (Signed)
  HEALTH MAINTENANCE RECOMMENDATIONS:  It is recommended that you get at least 30 minutes of aerobic exercise at least 5 days/week (for weight loss, you may need as much as 60-90 minutes). This can be any activity that gets your heart rate up. This can be divided in 10-15 minute intervals if needed, but try and build up your endurance at least once a week.  Weight bearing exercise is also recommended twice weekly.  Eat a healthy diet with lots of vegetables, fruits and fiber.  "Colorful" foods have a lot of vitamins (ie green vegetables, tomatoes, red peppers, etc).  Limit sweet tea, regular sodas and alcoholic beverages, all of which has a lot of calories and sugar.  Up to 1 alcoholic drink daily may be beneficial for women (unless trying to lose weight, watch sugars).  Drink a lot of water.  Calcium recommendations are 1200-1500 mg daily (1500 mg for postmenopausal women or women without ovaries), and vitamin D 1000 IU daily.  This should be obtained from diet and/or supplements (vitamins), and calcium should not be taken all at once, but in divided doses.  Monthly self breast exams and yearly mammograms for women over the age of 40 is recommended.  Sunscreen of at least SPF 30 should be used on all sun-exposed parts of the skin when outside between the hours of 10 am and 4 pm (not just when at beach or pool, but even with exercise, golf, tennis, and yard work!)  Use a sunscreen that says "broad spectrum" so it covers both UVA and UVB rays, and make sure to reapply every 1-2 hours.  Remember to change the batteries in your smoke detectors when changing your clock times in the spring and fall.  Use your seat belt every time you are in a car, and please drive safely and not be distracted with cell phones and texting while driving.  I recommend getting the new shingles vaccine (Shingrix). You will need to check with your insurance to see if it is covered, and if covered by Medicare Part D, you need to  get from the pharmacy rather than our office.  It is a series of 2 injections, spaced 2 months apart.  

## 2017-08-23 ENCOUNTER — Other Ambulatory Visit: Payer: Self-pay | Admitting: Family Medicine

## 2017-08-23 DIAGNOSIS — J309 Allergic rhinitis, unspecified: Secondary | ICD-10-CM

## 2017-08-30 ENCOUNTER — Other Ambulatory Visit: Payer: BC Managed Care – PPO

## 2017-08-30 DIAGNOSIS — Z0000 Encounter for general adult medical examination without abnormal findings: Secondary | ICD-10-CM

## 2017-08-31 LAB — CBC WITH DIFFERENTIAL/PLATELET
BASOS PCT: 1 %
Basophils Absolute: 51 cells/uL (ref 0–200)
EOS PCT: 8.2 %
Eosinophils Absolute: 418 cells/uL (ref 15–500)
HCT: 39.1 % (ref 35.0–45.0)
HEMOGLOBIN: 13.6 g/dL (ref 11.7–15.5)
Lymphs Abs: 1862 cells/uL (ref 850–3900)
MCH: 30.1 pg (ref 27.0–33.0)
MCHC: 34.8 g/dL (ref 32.0–36.0)
MCV: 86.5 fL (ref 80.0–100.0)
MONOS PCT: 9.4 %
MPV: 10.7 fL (ref 7.5–12.5)
NEUTROS ABS: 2290 {cells}/uL (ref 1500–7800)
Neutrophils Relative %: 44.9 %
PLATELETS: 292 10*3/uL (ref 140–400)
RBC: 4.52 10*6/uL (ref 3.80–5.10)
RDW: 12.9 % (ref 11.0–15.0)
TOTAL LYMPHOCYTE: 36.5 %
WBC mixed population: 479 cells/uL (ref 200–950)
WBC: 5.1 10*3/uL (ref 3.8–10.8)

## 2017-08-31 LAB — COMPREHENSIVE METABOLIC PANEL
AG RATIO: 1.5 (calc) (ref 1.0–2.5)
ALT: 20 U/L (ref 6–29)
AST: 17 U/L (ref 10–35)
Albumin: 4.4 g/dL (ref 3.6–5.1)
Alkaline phosphatase (APISO): 64 U/L (ref 33–130)
BILIRUBIN TOTAL: 0.7 mg/dL (ref 0.2–1.2)
BUN/Creatinine Ratio: 11 (calc) (ref 6–22)
BUN: 12 mg/dL (ref 7–25)
CALCIUM: 9.9 mg/dL (ref 8.6–10.4)
CO2: 30 mmol/L (ref 20–32)
Chloride: 101 mmol/L (ref 98–110)
Creat: 1.08 mg/dL — ABNORMAL HIGH (ref 0.50–1.05)
Globulin: 3 g/dL (calc) (ref 1.9–3.7)
Glucose, Bld: 91 mg/dL (ref 65–99)
Potassium: 4.6 mmol/L (ref 3.5–5.3)
SODIUM: 138 mmol/L (ref 135–146)
Total Protein: 7.4 g/dL (ref 6.1–8.1)

## 2017-08-31 LAB — LIPID PANEL
Cholesterol: 221 mg/dL — ABNORMAL HIGH (ref <200)
HDL: 69 mg/dL (ref >50)
LDL Cholesterol (Calc): 127 mg/dL (calc) — ABNORMAL HIGH
Non-HDL Cholesterol (Calc): 152 mg/dL (calc) — ABNORMAL HIGH (ref <130)
TRIGLYCERIDES: 134 mg/dL (ref <150)
Total CHOL/HDL Ratio: 3.2 (calc) (ref <5.0)

## 2017-08-31 LAB — TSH: TSH: 1.14 m[IU]/L

## 2017-09-09 ENCOUNTER — Encounter: Payer: Self-pay | Admitting: Family Medicine

## 2017-09-18 ENCOUNTER — Telehealth: Payer: Self-pay | Admitting: Family Medicine

## 2017-09-18 NOTE — Telephone Encounter (Signed)
P.A. ZOLPIDEM completed

## 2017-09-26 NOTE — Telephone Encounter (Signed)
P.A. Approved til 09/17/20

## 2017-09-28 NOTE — Telephone Encounter (Signed)
Left message for pt

## 2017-10-06 ENCOUNTER — Other Ambulatory Visit: Payer: Self-pay | Admitting: Family Medicine

## 2017-10-06 DIAGNOSIS — F411 Generalized anxiety disorder: Secondary | ICD-10-CM

## 2017-10-06 NOTE — Telephone Encounter (Signed)
Is this okay to refill? 

## 2017-10-22 ENCOUNTER — Other Ambulatory Visit: Payer: Self-pay | Admitting: Family Medicine

## 2017-10-22 DIAGNOSIS — F325 Major depressive disorder, single episode, in full remission: Secondary | ICD-10-CM

## 2017-10-25 NOTE — Telephone Encounter (Signed)
Are these okay to fill until next Dec?

## 2017-11-24 IMAGING — MR MR CERVICAL SPINE W/O CM
5 series · 48 of 48 positions shown · non-contrast
Comparison: None.

CLINICAL DATA: Chronic neck pain for 4 months. No history of
injury.

EXAM:
MRI CERVICAL SPINE WITHOUT CONTRAST
TECHNIQUE: Multiplanar, multisequence MR imaging of the cervical spine was
performed. No intravenous contrast was administered.

[Series 5: T1 · sagittal · 3.0mm · 0.82mm/px · 6 of 15 slices shown]
[im 1/15]
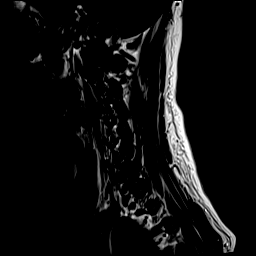
[im 3/15]
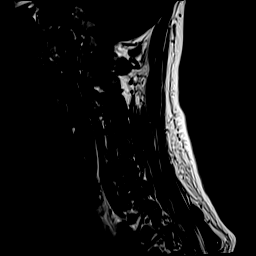
[im 6/15]
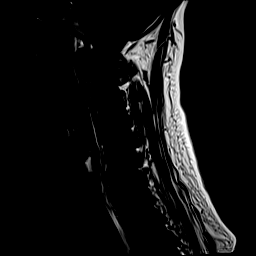
[im 9/15]
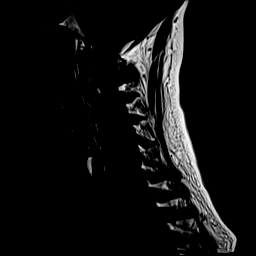
[im 12/15]
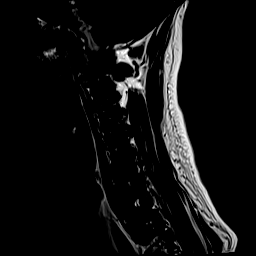
[im 15/15]
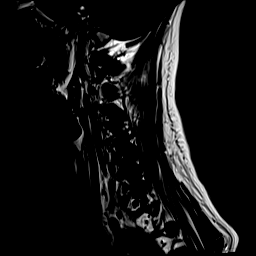

[Series 6: T2 · sagittal · 3.0mm · 0.66mm/px · 7 of 15 slices shown (1 of 2)]
[im 1/15]
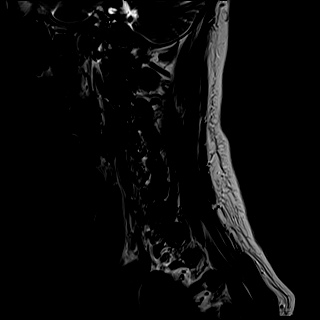
[im 3/15]
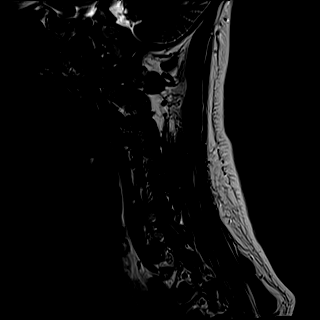
[im 5/15]
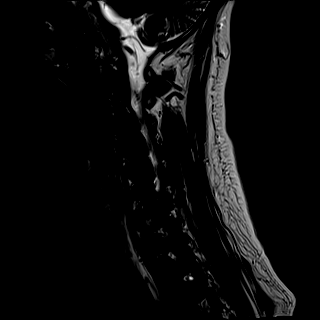
[im 8/15]
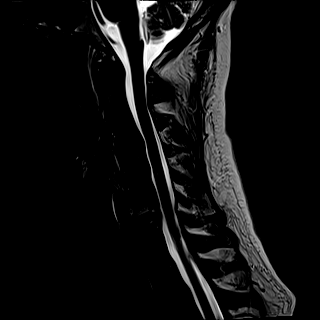
[im 10/15]
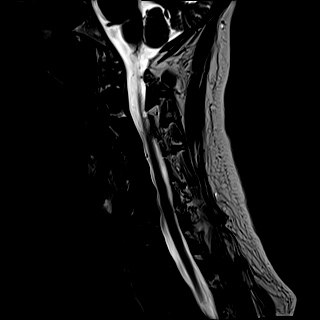
[im 12/15]
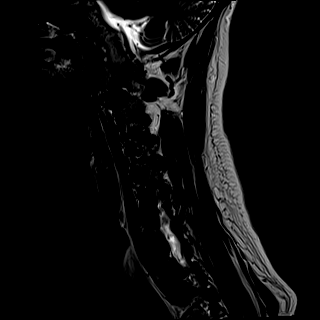
[im 15/15]
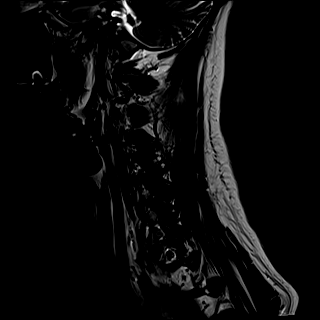

[Series 7: STIR · sagittal · 3.0mm · 0.41mm/px · 7 of 15 slices shown]
[im 1/15]
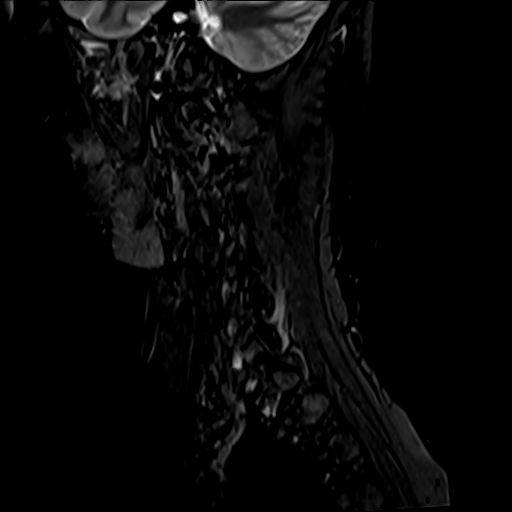
[im 3/15]
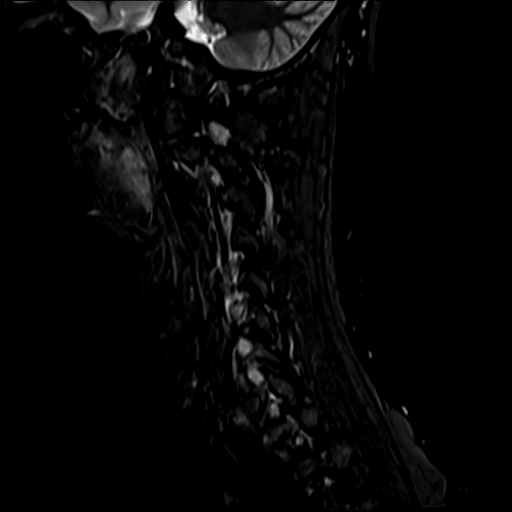
[im 5/15]
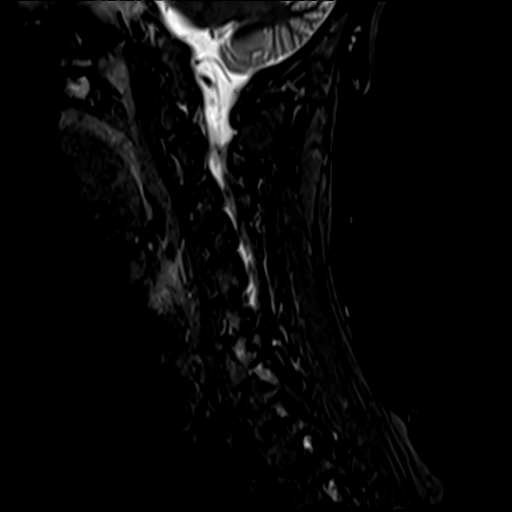
[im 8/15]
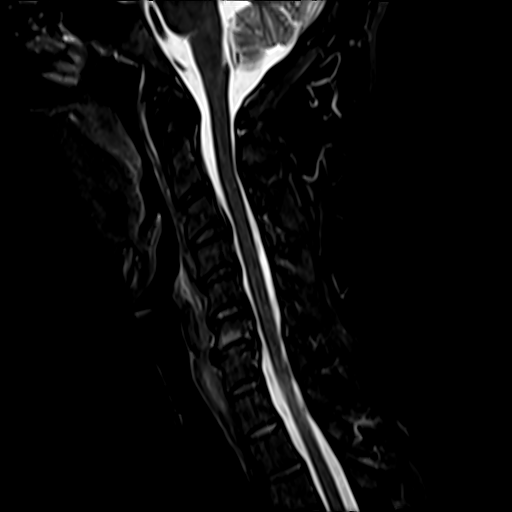
[im 10/15]
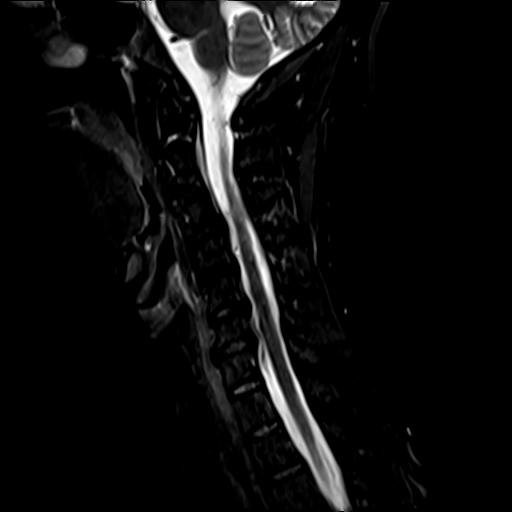
[im 12/15]
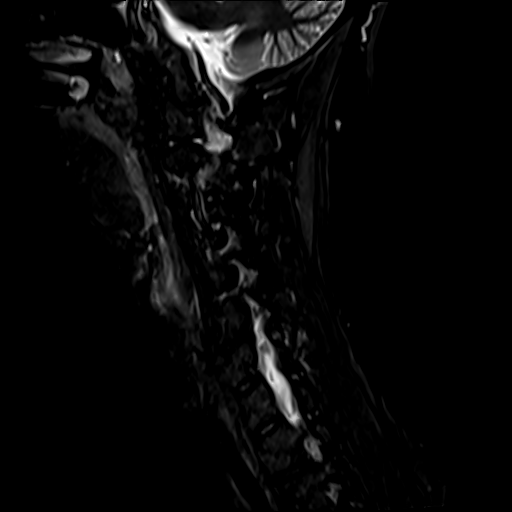
[im 15/15]
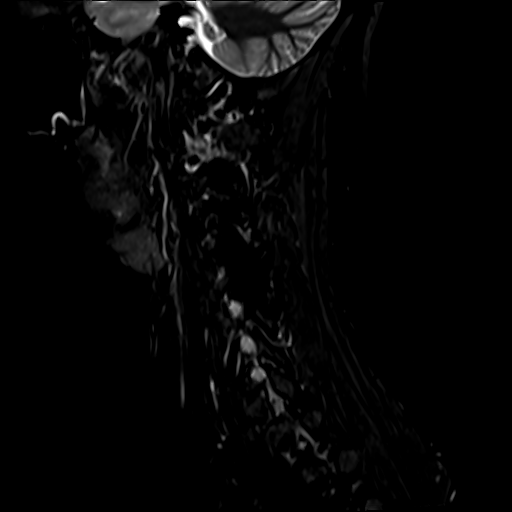

[Series 8: T2 · axial · 3.0mm · 0.62mm/px · z∈[-80,+18]mm · 14 of 32 slices shown (2 of 2)]
[im 1/32]
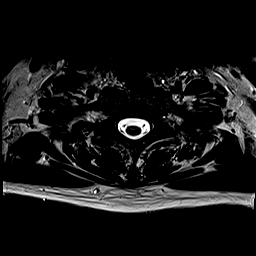
[im 3/32]
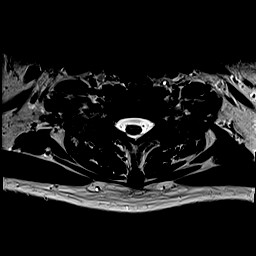
[im 5/32]
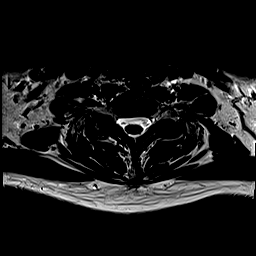
[im 8/32]
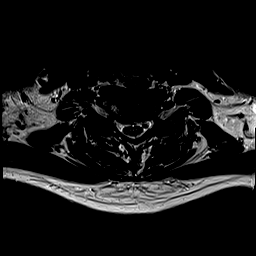
[im 10/32]
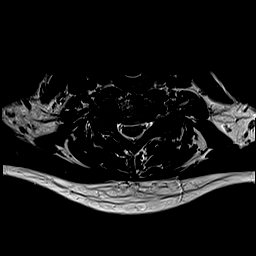
[im 12/32]
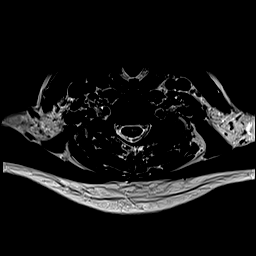
[im 15/32]
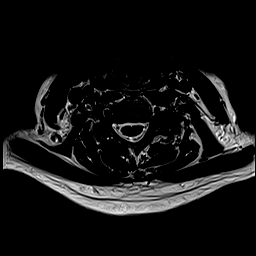
[im 17/32]
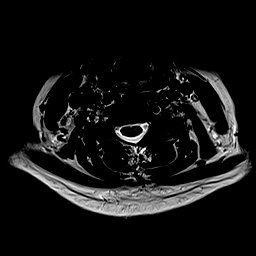
[im 20/32]
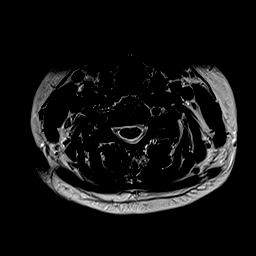
[im 22/32]
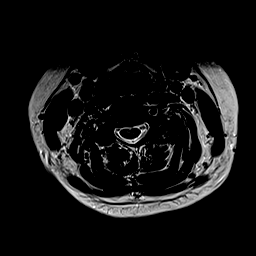
[im 24/32]
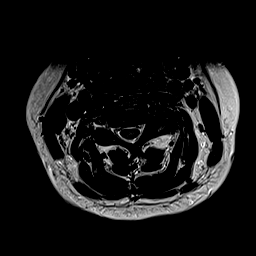
[im 27/32]
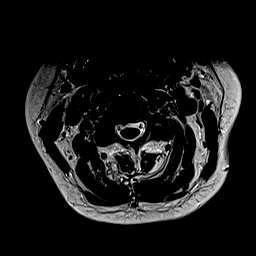
[im 29/32]
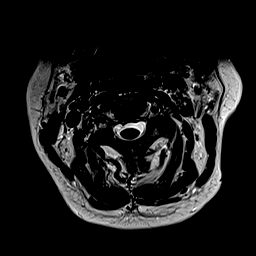
[im 32/32]
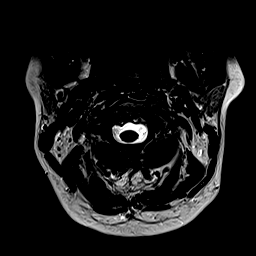

[Series 9: GRE · axial · 3.0mm · 0.83mm/px · z∈[-80,+18]mm · 14 of 32 slices shown]
[im 1/32]
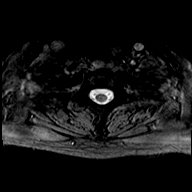
[im 3/32]
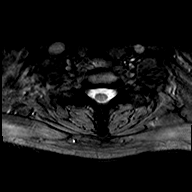
[im 5/32]
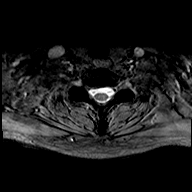
[im 8/32]
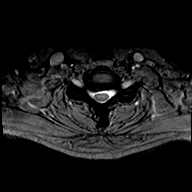
[im 10/32]
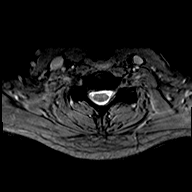
[im 12/32]
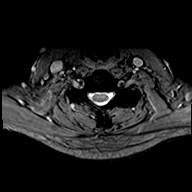
[im 15/32]
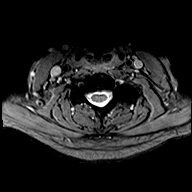
[im 17/32]
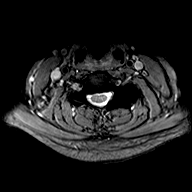
[im 20/32]
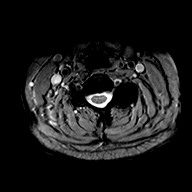
[im 22/32]
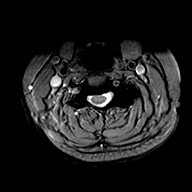
[im 24/32]
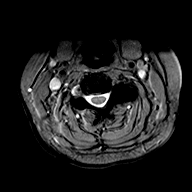
[im 27/32]
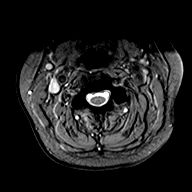
[im 29/32]
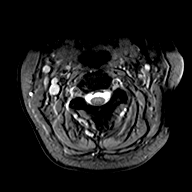
[im 32/32]
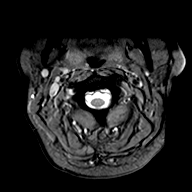

[48 of 48 positions shown; findings below may reference images not displayed]

FINDINGS: Alignment:  Normal cervical lordosis. No static listhesis.

Bones: Vertebral body heights are maintained. Bone marrow signal is
normal.

Spinal cord:  Cervical cord is normal in size and signal.

Posterior fossa: No focal abnormality.

Vessels:  Normal flow voids are present.

Paraspinal tissues:  No focal abnormality.

Disc levels:

Discs: Mild degenerative disc disease at C6-7.

C2-3: No significant disc bulge. No neural foraminal stenosis. No
central canal stenosis. Mild left facet arthropathy.

C3-4: Mild broad-based disc bulge. Severe left facet arthropathy and
severe left foraminal stenosis. Mild right foraminal stenosis. No
central canal stenosis.

C4-5: Mild broad-based disc bulge. Severe left facet arthropathy.
Severe left foraminal stenosis and mild right foraminal stenosis. No
central canal stenosis.

C5-6: Mild broad-based disc bulge. Severe left facet arthropathy and
left uncovertebral degenerative change resulting in severe left
foraminal stenosis. No central canal stenosis.

C6-7: Right paracentral disc protrusion. Right uncovertebral
degenerative change. Severe right foraminal stenosis. No central
canal stenosis.

C7-T1: No significant disc bulge. No neural foraminal stenosis. No
central canal stenosis.
IMPRESSION: 1. At C3-4 there is a mild broad-based disc bulge. Severe left facet
arthropathy and severe left foraminal stenosis. Mild right foraminal
stenosis.
2. At C4-5 there is a mild broad-based disc bulge. Severe left facet
arthropathy. Severe left foraminal stenosis and mild right foraminal
stenosis.
3. At C5-6 there is a mild broad-based disc bulge. Severe left facet
arthropathy and left uncovertebral degenerative change resulting in
severe left foraminal stenosis.
4. At C6-7 there is a right paracentral disc protrusion. Right
uncovertebral degenerative change. Severe right foraminal stenosis.

## 2017-12-26 ENCOUNTER — Other Ambulatory Visit: Payer: Self-pay | Admitting: Family Medicine

## 2017-12-26 DIAGNOSIS — G4700 Insomnia, unspecified: Secondary | ICD-10-CM

## 2017-12-27 NOTE — Telephone Encounter (Signed)
Is this okay to refill? 

## 2018-02-22 ENCOUNTER — Other Ambulatory Visit: Payer: Self-pay | Admitting: Family Medicine

## 2018-02-22 DIAGNOSIS — J309 Allergic rhinitis, unspecified: Secondary | ICD-10-CM

## 2018-02-22 NOTE — Telephone Encounter (Signed)
Is this okay to refill? 

## 2018-04-02 ENCOUNTER — Other Ambulatory Visit: Payer: Self-pay | Admitting: Family Medicine

## 2018-04-02 DIAGNOSIS — G4700 Insomnia, unspecified: Secondary | ICD-10-CM

## 2018-04-04 NOTE — Telephone Encounter (Signed)
Is this okay to refill? 

## 2018-04-15 ENCOUNTER — Other Ambulatory Visit: Payer: Self-pay

## 2018-04-15 DIAGNOSIS — F411 Generalized anxiety disorder: Secondary | ICD-10-CM

## 2018-04-15 MED ORDER — ALPRAZOLAM 0.5 MG PO TABS: ORAL_TABLET | ORAL | 0 refills | Status: DC

## 2018-04-15 NOTE — Telephone Encounter (Signed)
Last refilled 10/06/17.  R/f sent

## 2018-04-15 NOTE — Telephone Encounter (Signed)
Pharmacy faxed a refill request for Alprazolam. Please advise

## 2018-04-20 ENCOUNTER — Ambulatory Visit: Payer: BC Managed Care – PPO | Admitting: Family Medicine

## 2018-04-20 ENCOUNTER — Other Ambulatory Visit: Payer: Self-pay | Admitting: Family Medicine

## 2018-04-20 VITALS — BP 140/84 | HR 66 | Temp 98.1°F | Resp 16 | Ht 64.0 in | Wt 184.8 lb

## 2018-04-20 DIAGNOSIS — E559 Vitamin D deficiency, unspecified: Secondary | ICD-10-CM

## 2018-04-20 DIAGNOSIS — M7711 Lateral epicondylitis, right elbow: Secondary | ICD-10-CM

## 2018-04-20 DIAGNOSIS — Z1231 Encounter for screening mammogram for malignant neoplasm of breast: Secondary | ICD-10-CM

## 2018-04-20 DIAGNOSIS — M7712 Lateral epicondylitis, left elbow: Secondary | ICD-10-CM

## 2018-04-20 DIAGNOSIS — M2550 Pain in unspecified joint: Secondary | ICD-10-CM

## 2018-04-20 DIAGNOSIS — R5383 Other fatigue: Secondary | ICD-10-CM | POA: Diagnosis not present

## 2018-04-20 DIAGNOSIS — G4700 Insomnia, unspecified: Secondary | ICD-10-CM

## 2018-04-20 MED ORDER — ZOLPIDEM TARTRATE 10 MG PO TABS: 5.0000 mg | ORAL_TABLET | Freq: Every evening | ORAL | 0 refills | Status: DC | PRN

## 2018-04-20 NOTE — Patient Instructions (Addendum)
  Consider glucosamine, chondroitin, turmeric as supplements that can help with inflammation and pain.  Please be sure to schedule your routine mammogram (I will not consider hormone replacement if you don't have a current mammogram).  We will discuss potential hormone replacement if no other causes are found.  We are changing back to the plain [place], since the extended release leaves you groggy.  Try tennis elbow straps when exercising.

## 2018-04-20 NOTE — Progress Notes (Signed)
Chief Complaint  Patient presents with  . Brandi Carpenter    body hurts achey, tired    "feeling crappy", "I just don't feel right". Complaining of joint pains, fatigue.  She has been exercising (working out at [employer]), walking.since March or April. Still hasn't lost weight.  She is complaining of pain in her elbows, forearms, hips, feet since she restarted her exercise routine.  Needs to lay off for a few days, improves, but then recurs.  One tender spot in her lower back, more chronic, unchanged.  Tracking her food with [employer]itness[employer], and is eating less (no longer over-eating like she had been).  Arms/elbows get sore even "just with walking, hurts to move it".  Feels it in her joints, not muscles.  She has iced which temporarily helps.  Uses some advil, ? Any benefit.  Complains of feeling tired, sometimes feels jittery, unsure if related to caffeine.  Some increased restless legs recently, moreso than in the past few years. Also reports her memory isn't as good, fumbles with things more with her hands.  Some flare of allergies in the last couple of weeks, itchy eyes.  LMP 1.5 years ago.  +hot flashes. OTC supplement helped some initially.  Insomnia--changed to Ambien CR, and has some increased grogginess in the morning. Using it most nights.  Prefers the regular ambien.  PMH, [place] SH reviewed  Outpatient Encounter Medications as of 04/20/2018  Medication Sig Note  . buPROPion (WELLBUTRIN XL) 300 MG 24 hr tablet take 1 tablet by mouth every morning   . Cholecalciferol (VITAMIN D) 2000 UNITS tablet Take 2,000 Units by mouth daily.   . citalopram (CELEXA) 20 MG tablet take 1 tablet by mouth once daily   . EPIPEN 2-PAK 0.3 MG/0.3ML SOAJ injection Reported on 02/17/2016   . fluticasone (FLONASE) 50 MCG/ACT nasal spray INSTILL 2 SPRAYS INTO EACH NOSTRIL twice a day   . guaiFENesin (MUCINEX) 600 MG 12 hr tablet Take 1,200 mg by mouth 2 (two) times daily.   Marland Kitchen levocetirizine (XYZAL) 5 MG tablet TAKE 1  TABLET BY MOUTH EVERY EVENING   . olopatadine (PATANOL) 0.1 % ophthalmic solution place 1 drop into both eyes twice a day   . Olopatadine HCl (PATANASE) 0.6 % SOLN Place 1-2 each into the nose 2 (two) times daily. Reported on 11/11/2015 08/12/2016: Uses prn  . Omega-3 Fatty Acids (FISH OIL) 1200 MG CAPS Take 1,200 capsules by mouth.   Marland Kitchen OVER THE COUNTER MEDICATION Take 1 tablet by mouth 2 (two) times daily.  08/01/2015: Nature's Bounty AM/PM menopausal supplement  . [DISCONTINUED] zolpidem (AMBIEN CR) 6.25 MG CR tablet TAKE 1 TO 2 TABLETS BY MOUTH AT BEDTIME AS NEEDED FOR SLEEP   . ALPRAZolam (XANAX) 0.5 MG tablet take 1/2 to 1 tablet by mouth three times a day if needed for anxiety (Patient not taking: Reported on 04/20/2018) 04/20/2018: Recently refilled, rarely uses  . zolpidem (AMBIEN) 10 MG tablet Take 0.5-1 tablets (5-10 mg total) by mouth at bedtime as needed for sleep.    No facility-administered encounter medications on file as of 04/20/2018.    Allergies  Allergen Reactions  . Codeine Itching and Nausea And Vomiting  . Pamelor [Nortriptyline] Tinitus   ROS: no fever, chills, headaches, dizziness, chest pain, shortness of breath.  +allergies flaring; also reports mouth/throat irritation. Denies heartburn, nausea, vomiting, bowel changes. No bleeding, bruising, rash. See HPI   PHYSICAL EXAM:  BP 140/84   Pulse 66   Temp 98.1 F (36.7 C) (Oral)  Resp 16   Ht 5' 4"  (1.626 m)   Wt 184 lb 12.8 oz (83.8 kg)   LMP 01/06/2016   SpO2 97%   BMI 31.72 kg/m   Wt Readings from Last 3 Encounters:  04/20/18 184 lb 12.8 oz (83.8 kg)  08/18/17 182 lb (82.6 kg)  09/24/16 177 lb 3.2 oz (80.4 kg)   Well appearing female, in no distress HEENT: conjunctiva and sclera are clear, EOMI. OP is clear. Nasal mucosa notable for mod edema with whitish yellow mucus bilaterally. Sinuses are nontender Neck: no lymphadenopathy or mass Heart: regular rate and rhythm Lungs: clear bilaterally Back: no CVA  tenderness.  Tender around L3-4 only, no muscle spasm Abdomen: soft, nontender, no organomegaly or mass Extremities: no edema, normal pulses. Tender at bilateral lateral epicondyles, and medial epicondyle on the left only.  She has FROM of hips, area of discomfort is around ASIS.  No pain with ITB stretch, nontender at trochanteric bursa.   No effusions, erythema/warmth at joints.   ASSESSMENT/PLAN:   Arthralgia, unspecified joint - will do lab w/u, but really seems more tendinous, prob related to new exercise routine - Plan: CBC with Differential/Platelet, Sedimentation Rate, ANA, IFA (with reflex), Rheumatoid factor  Vitamin D deficiency  Fatigue, unspecified type - multifactorial--contributed by menopause, poss sedation from med--change back to regular zolpidem - Plan: CBC with Differential/Platelet, TSH, VITAMIN D 25 Hydroxy (Vit-D Deficiency, Fractures)  Insomnia, unspecified type - Plan: zolpidem (AMBIEN) 10 MG tablet  Lateral epicondylitis of both elbows - discussed which exercises to avoid, poss use of wrist brace to assist preventing overuse; elbow strap recommended with exercise   Change back to regular ambien, worsening fatigue.  CBC, TSH, Vit D, ESR, ANA, RA  Consider glucosamine, chondroitin, turmeric as supplements that can help with inflammation and pain.  Please be sure to schedule your routine mammogram (I will not consider hormone replacement if you don't have a current mammogram).  We will discuss potential hormone replacement if no other causes are found.  Try tennis elbow straps when exercising.

## 2018-04-21 ENCOUNTER — Encounter: Payer: Self-pay | Admitting: Family Medicine

## 2018-04-23 LAB — TSH: TSH: 1.26 u[IU]/mL (ref 0.450–4.500)

## 2018-04-23 LAB — CBC WITH DIFFERENTIAL/PLATELET
BASOS ABS: 0 10*3/uL (ref 0.0–0.2)
BASOS: 0 %
EOS (ABSOLUTE): 0.2 10*3/uL (ref 0.0–0.4)
Eos: 2 %
Hematocrit: 41.7 % (ref 34.0–46.6)
Hemoglobin: 14 g/dL (ref 11.1–15.9)
Immature Grans (Abs): 0 10*3/uL (ref 0.0–0.1)
Immature Granulocytes: 0 %
Lymphocytes Absolute: 1.9 10*3/uL (ref 0.7–3.1)
Lymphs: 28 %
MCH: 30.7 pg (ref 26.6–33.0)
MCHC: 33.6 g/dL (ref 31.5–35.7)
MCV: 91 fL (ref 79–97)
MONOS ABS: 0.6 10*3/uL (ref 0.1–0.9)
Monocytes: 10 %
NEUTROS ABS: 3.9 10*3/uL (ref 1.4–7.0)
NEUTROS PCT: 60 %
Platelets: 297 10*3/uL (ref 150–450)
RBC: 4.56 x10E6/uL (ref 3.77–5.28)
RDW: 13.6 % (ref 12.3–15.4)
WBC: 6.7 10*3/uL (ref 3.4–10.8)

## 2018-04-23 LAB — SEDIMENTATION RATE: Sed Rate: 3 mm/hr (ref 0–40)

## 2018-04-23 LAB — ANTINUCLEAR ANTIBODIES, IFA: ANA Titer 1: NEGATIVE

## 2018-04-23 LAB — VITAMIN D 25 HYDROXY (VIT D DEFICIENCY, FRACTURES): Vit D, 25-Hydroxy: 67.5 ng/mL (ref 30.0–100.0)

## 2018-04-23 LAB — RHEUMATOID FACTOR: Rhuematoid fact SerPl-aCnc: <10 IU/mL (ref 0.0–13.9)

## 2018-04-29 ENCOUNTER — Ambulatory Visit
Admission: RE | Admit: 2018-04-29 | Discharge: 2018-04-29 | Disposition: A | Discharge status: Home / Self Care | Payer: BC Managed Care – PPO | Source: Ambulatory Visit | Attending: Family Medicine | Admitting: Family Medicine

## 2018-04-29 DIAGNOSIS — Z1231 Encounter for screening mammogram for malignant neoplasm of breast: Secondary | ICD-10-CM

## 2018-05-01 NOTE — Progress Notes (Signed)
Chief Complaint  Patient presents with  . Menopause    hot flashes, weight gain, fatigue, memory loss, coordination, pain in her arms, shoulders, hands and feet (numbness and tingling). States that she is easy aggitated.     Seen 8/7 with the following complaints: "feeling crappy", "I just don't feel right". Complaining of joint pains, fatigue. She had been exercising (working out at [employer]), walking.since March or April. Still hasn't lost weight, and was complaining of pain in her elbows, forearms, hips, feet since she restarted her exercise routine. Complains of feeling tired, sometimes feels jittery, unsure if related to caffeine.  Some increased restless legs recently, moreso than in the past few years. Also reports her memory isn't as good, fumbles with things more with her hands. When seen last week she was having some flare of allergies, with itchy eyes, but this has improved since last visit.  LMP 1.5 years ago.  +hot flashes. OTC supplement helped some initially. Insomnia--we changed to Ambien CR, and has some increased grogginess in the morning. Using it most nights. She was switched back to the regular ambien, but hasn't picked that prescription up yet.  Her partner also reminded her that she has been easily agitated, like before the wellbutrin. She continues on wellbutrin and citalopram, feels like moods are okay, just some irritability.  Lab work-up was unremarkable--normal ESR, negative RF, ANA, normal CBC, Vit D and TSH. Exam was remarkable for tenderness at lateral epicondyles (and medial on the left) and tenderness around ASIS bilat.  No joint effusions, warmth or other abnormality noted.  We had discussed menopausal symptoms, briefly discussed HRT, but she was past due in getting mammogram, and we discussed the need for mammograms yearly in order to take HRT.  She had this done on 04/29/18 and it was normal.  No longer having night sweats; but has hot flashes more frequently  than in the past.  Hasn't exercised in 2 weeks, and still is hurting. Had some tingling in her right hand when using backpack leaf blower. Having pain in her left bicep area, as well as continued pain in her elbows.  PMH, PSH, SH reviewed  Outpatient Encounter Medications as of 05/02/2018  Medication Sig Note  . ALPRAZolam (XANAX) 0.5 MG tablet take 1/2 to 1 tablet by mouth three times a day if needed for anxiety 04/20/2018: Recently refilled, rarely uses  . buPROPion (WELLBUTRIN XL) 300 MG 24 hr tablet take 1 tablet by mouth every morning   . Cholecalciferol (VITAMIN D) 2000 UNITS tablet Take 2,000 Units by mouth daily.   . citalopram (CELEXA) 20 MG tablet take 1 tablet by mouth once daily   . fluticasone (FLONASE) 50 MCG/ACT nasal spray INSTILL 2 SPRAYS INTO EACH NOSTRIL twice a day   . guaiFENesin (MUCINEX) 600 MG 12 hr tablet Take 1,200 mg by mouth 2 (two) times daily.   Marland Kitchen levocetirizine (XYZAL) 5 MG tablet TAKE 1 TABLET BY MOUTH EVERY EVENING   . olopatadine (PATANOL) 0.1 % ophthalmic solution place 1 drop into both eyes twice a day   . Olopatadine HCl (PATANASE) 0.6 % SOLN Place 1-2 each into the nose 2 (two) times daily. Reported on 11/11/2015 08/12/2016: Uses prn  . Omega-3 Fatty Acids (FISH OIL) 1200 MG CAPS Take 1,200 capsules by mouth.   Marland Kitchen OVER THE COUNTER MEDICATION Take 1 tablet by mouth 2 (two) times daily.  08/01/2015: Nature's Bounty AM/PM menopausal supplement  . zolpidem (AMBIEN) 10 MG tablet Take 0.5-1 tablets (5-10 mg total)  by mouth at bedtime as needed for sleep.   Marland Kitchen EPIPEN 2-PAK 0.3 MG/0.3ML SOAJ injection Reported on 02/17/2016    No facility-administered encounter medications on file as of 05/02/2018.    Allergies  Allergen Reactions  . Codeine Itching and Nausea And Vomiting  . Pamelor [Nortriptyline] Tinitus   ROS:  See HPI.  +fatigue, inability to lose weight, hot flashes, irritability, joint/muscle pains. No chest pain, shortness of breath, edema, bleeding, bruising  or other concerns except as noted.   PHYSICAL EXAM:  BP 130/84   Pulse 72   Ht 5' 4"  (1.626 m)   Wt 185 lb 12.8 oz (84.3 kg)   LMP 01/06/2016   BMI 31.89 kg/m   Well appearing female in no distress HEENT: conjunctiva and sclera clear, EOMI Neck: no lymphadenopathy, thyromegaly or mass Heart: regular rate and rhythm Lungs; clear Extremities: Negative Phalen, Tinel's. Remains tender at bilateral lateral epicondyles and left medial epicondyle.   ASSESSMENT/PLAN:  Postmenopausal symptoms - reviewed risks/side effects of HRT in detail, would like to try - Plan: estradiol (CLIMARA - DOSED IN MG/24 HR) 0.05 mg/24hr patch, progesterone (PROMETRIUM) 100 MG capsule  Fatigue, unspecified type  Insomnia, unspecified type - change back to plain zolpidem as previously discussed  Lateral epicondylitis of both elbows - Plan: Ambulatory referral to Sports Medicine  Body aches - Plan: Ambulatory referral to Sports Medicine  Depression, major, in remission ([place]) - some recent irritabiliity noted, more likely related to menopause. Declines change in anti-depressants (discussed change to Prozac to help with energy/wt  Trial HRT. Risks/SE reviewed in detail.  Sports med [employer]  Sleep study if not improving Poss RLS, less likely apnea (some snoring per partner, denies apnea)  Pharmacy--[facility]

## 2018-05-02 ENCOUNTER — Encounter: Payer: Self-pay | Admitting: Family Medicine

## 2018-05-02 ENCOUNTER — Ambulatory Visit: Payer: BC Managed Care – PPO | Admitting: Family Medicine

## 2018-05-02 VITALS — BP 130/84 | HR 72 | Ht 64.0 in | Wt 185.8 lb

## 2018-05-02 DIAGNOSIS — N959 Unspecified menopausal and perimenopausal disorder: Secondary | ICD-10-CM | POA: Diagnosis not present

## 2018-05-02 DIAGNOSIS — R5383 Other fatigue: Secondary | ICD-10-CM

## 2018-05-02 DIAGNOSIS — M7711 Lateral epicondylitis, right elbow: Secondary | ICD-10-CM | POA: Diagnosis not present

## 2018-05-02 DIAGNOSIS — G4700 Insomnia, unspecified: Secondary | ICD-10-CM

## 2018-05-02 DIAGNOSIS — F325 Major depressive disorder, single episode, in full remission: Secondary | ICD-10-CM

## 2018-05-02 DIAGNOSIS — M7712 Lateral epicondylitis, left elbow: Secondary | ICD-10-CM

## 2018-05-02 DIAGNOSIS — R52 Pain, unspecified: Secondary | ICD-10-CM

## 2018-05-02 MED ORDER — ESTRADIOL 0.05 MG/24HR TD PTWK: 0.0500 mg | MEDICATED_PATCH | TRANSDERMAL | 5 refills | Status: DC

## 2018-05-02 MED ORDER — PROGESTERONE MICRONIZED 100 MG PO CAPS: 100.0000 mg | ORAL_CAPSULE | Freq: Every day | ORAL | 5 refills | Status: DC

## 2018-05-02 NOTE — Patient Instructions (Addendum)
We discussed hormone replacement, the possibility of changing the SSRI (ie changing citalopram to Prozac, and potentially titrating up the dose). We discussed sports med referral for your soft tissue/body pains. We will refer you to Dr. Charlann Boxer.  We elected to try the hormones first, holding off on changing the anti-depressants. We also discussed the possibility of a sleep study if daytime somnolence isn't improving with the hormonal therapy.  We discussed risks/side effects of the hormone replacement.  Abnormal bleeding can be a sign of uterine abnormality (worst case scenario=cancer), but that it takes time for the hormones to get adjusted at first.  If any ongoing issues, we may end up sending you to a GYN.   Menopause and Hormone Replacement Therapy What is hormone replacement therapy? Hormone replacement therapy (HRT) is the use of artificial (synthetic) hormones to replace hormones that your body stops producing during menopause. Menopause is the normal time of life when menstrual periods stop completely and the ovaries stop producing the female hormones estrogen and progesterone. This lack of hormones can affect your health and cause undesirable symptoms. HRT can relieve some of those symptoms. What are my options for HRT? HRT may consist of the synthetic hormones estrogen and progestin, or it may consist of only estrogen (estrogen-only therapy). You and your health care provider will decide which form of HRT is best for you. If you choose to be on HRT and you have a uterus, estrogen and progestin are usually prescribed. Estrogen-only therapy is used for women who do not have a uterus. Possible options for taking HRT include:  Pills.  Patches.  Gels.  Sprays.  Vaginal cream.  Vaginal rings.  Vaginal inserts.  The amount of hormone(s) that you take and how long you take the hormone(s) varies depending on your individual health. It is important to:  Begin HRT with the lowest  possible dosage.  Stop HRT as soon as your health care provider tells you to stop.  Work with your health care provider so that you feel informed and comfortable with your decisions.  What are the benefits of HRT? HRT can reduce the frequency and severity of menopausal symptoms. Benefits of HRT vary depending on the menopausal symptoms that you have, the severity of your symptoms, and your overall health. HRT may help to improve the following menopausal symptoms:  Hot flashes and night sweats. These are sudden feelings of heat that spread over the face and body. The skin may turn red, like a blush. Night sweats are hot flashes that happen while you are sleeping or trying to sleep.  Bone loss (osteoporosis). The body loses calcium more quickly after menopause, causing the bones to become weaker. This can increase the risk for bone breaks (fractures).  Vaginal dryness. The lining of the vagina can become thin and dry, which can cause pain during sexual intercourse or cause infection, burning, or itching.  Urinary tract infections.  Urinary incontinence. This is a decreased ability to control when you urinate.  Irritability.  Short-term memory problems.  What are the risks of HRT? Risks of HRT vary depending on your individual health and medical history. Risks of HRT also depend on whether you receive both estrogen and progestin or you receive estrogen only.HRT may increase the risk of:  Spotting. This is when a small amount of bloodleaks from the vagina unexpectedly.  Endometrial cancer. This cancer is in the lining of the uterus (endometrium).  Breast cancer.  Increased density of breast tissue. This can make  it harder to find breast cancer on a breast X-ray (mammogram).  Stroke.  Heart attack.  Blood clots.  Gallbladder disease.  Risks of HRT can increase if you have any of the following conditions:  Endometrial cancer.  Liver disease.  Heart disease.  Breast  cancer.  History of blood clots.  History of stroke.  How should I care for myself while I am on HRT?  Take over-the-counter and prescription medicines only as told by your health care provider.  Get mammograms, pelvic exams, and medical checkups as often as told by your health care provider.  Have Pap tests done as often as told by your health care provider. A Pap test is sometimes called a Pap smear. It is a screening test that is used to check for signs of cancer of the cervix and vagina. A Pap test can also identify the presence of infection or precancerous changes. Pap tests may be done: ? Every 3 years, starting at age 26. ? Every 5 years, starting after age 8, in combination with testing for human papillomavirus (HPV). ? More often or less often depending on other medical conditions you have, your age, and other risk factors.  It is your responsibility to get your Pap test results. Ask your health care provider or the department performing the test when your results will be ready.  Keep all follow-up visits as told by your health care provider. This is important. When should I seek medical care? Talk with your health care provider if:  You have any of these: ? Pain or swelling in your legs. ? Shortness of breath. ? Chest pain. ? Lumps or changes in your breasts or armpits. ? Slurred speech. ? Pain, burning, or bleeding when you urine.  You develop any of these: ? Unusual vaginal bleeding. ? Dizziness or headaches. ? Weakness or numbness in any part of your arms or legs. ? Pain in your abdomen.  This information is not intended to replace advice given to you by your health care provider. Make sure you discuss any questions you have with your health care provider. Document Released: 05/30/2003 Document Revised: 07/28/2016 Document Reviewed: 03/04/2015 Elsevier Interactive Patient Education  2017 [employer].

## 2018-05-02 NOTE — Progress Notes (Signed)
[name] [address], [place] [address] Phone: [phone] Subjective:    I'm seeing this patient by the request  of:  Rita Ohara, MD   CC: Bilateral elbow pain  MGQ:[id number]  Brandi Carpenter is a 52 y.o. female coming in with complaint of bilateral elbow pain. Patient has pain medial, laterally and into the forearm bilaterally. Pain for the past 2 months. Right handed. Is unsure of how she injured herself. She has pain with flexion. No pain with working on her computer or mousing. Has had cervical fusion C4,5,6 in 2017. Also states that her feet hurt and low back pain.  Patient feels that since she was shown to be more active she has had more difficulty.  Did see her primary care provider to get labs that were fairly unremarkable.  Patient's fusion of the neck was in 2017 and did have x-rays in May of that year.  Independently visualized by me that showed good healing that was appropriate with good alignment of the fusion from C5-C7.     Past Medical History:  Diagnosis Date  . 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 Allergic rhinitis, cause unspecified   . Anxiety   . Complication of anesthesia    "difficulty awakening"  . Depression   . Headache   . Insomnia   . Vitamin D deficiency    Past Surgical History:  Procedure Laterality Date  . ANKLE SURGERY  child   left for torn ligaments, left  . ANTERIOR CERVICAL DECOMP/DISCECTOMY FUSION N/A 01/27/2016   Procedure: C5-6, C6-7 Anterior Cervical Discectomy and Fusion, Allograft, Plate;  Surgeon: Marybelle Killings, MD;  Location: [facility];  Service: Orthopedics;  Laterality: N/A;  . CERVICAL CONIZATION W/BX  2001  . NASAL SEPTOPLASTY W/ TURBINOPLASTY Bilateral 10/07/2015   Procedure: NASAL SEPTOPLASTY WITH BILATERAL TURBINATE REDUCTION;  Surgeon: Leta Baptist, MD;  Location: [facility];  Service: ENT;  Laterality: Bilateral;  . PILONIDAL CYST EXCISION      Social History   Socioeconomic History  . Marital status: Single    Spouse name: Not on file  . Number of children: 0  . Years of education: 37  . Highest education level: Not on file  Occupational History  . Occupation: Naval architect: [employer]  Social Needs  . Financial resource strain: Not on file  . Food insecurity:    Worry: Not on file    Inability: Not on file  . Transportation needs:    Medical: Not on file    Non-medical: Not on file  Tobacco Use  . Smoking status: Never Smoker  . Smokeless tobacco: Never Used  Substance and Sexual Activity  . Alcohol use: No    Alcohol/week: 0.0 standard drinks  . Drug use: No  . Sexual activity: Never    Partners: Female    Birth control/protection: Abstinence  Lifestyle  . Physical activity:    Days per week: Not on file    Minutes per session: Not on file  . Stress: Not on file  Relationships  . Social connections:    Talks on phone: Not on file    Gets together: Not on file    Attends religious service: Not on file    Active member of club or organization: Not on file    Attends meetings of clubs or organizations: Not on file    Relationship status: Not  on file  Other Topics Concern  . Not on file  Social History Narrative   Lives with female partner Juliann Pulse), 2 beagles   Caffeine use: drinks soda 3 drinks per day   Allergies  Allergen Reactions  . Codeine Itching and Nausea And Vomiting  . Pamelor [Nortriptyline] Tinitus   Family History  Problem Relation Age of Onset  . Hypertension Mother   . Diabetes Mother   . Heart disease Father   . Depression Sister   . Breast cancer Cousin   . Colon cancer Maternal Grandmother         died at 50  . Diabetes Maternal Grandmother   . Alzheimer's disease Paternal Grandmother   . Colon polyps Maternal Uncle   . Migraines Neg Hx      Past medical history, social, surgical and family history all reviewed in electronic medical record.  No  pertanent information unless stated regarding to the chief complaint.   Review of Systems:Review of systems updated and as accurate as of 05/02/18  No headache, visual changes, nausea, vomiting, diarrhea, constipation, dizziness, abdominal pain, skin rash, fevers, chills, night sweats, weight loss, swollen lymph nodes, body aches, joint swelling, muscle aches, chest pain, shortness of breath, mood changes.   Objective  Last menstrual period 01/06/2016. Systems examined below as of 05/02/18   General: No apparent distress alert and oriented x3 mood and affect normal, dressed appropriately.  HEENT: Pupils equal, extraocular movements intact  Respiratory: Patient's speak in full sentences and does not appear short of breath  Cardiovascular: No lower extremity edema, non tender, no erythema  Skin: Warm dry intact with no signs of infection or rash on extremities or on axial skeleton.  Abdomen: Soft nontender  Neuro: Cranial nerves II through XII are intact, neurovascularly intact in all extremities with 2+ DTRs and 2+ pulses.  Lymph: No lymphadenopathy of posterior or anterior cervical chain or axillae bilaterally.  Gait normal with good balance and coordination.  MSK:  Non tender with full range of motion and good stability and symmetric strength and tone of shoulders, elbows, wrist, hip, knee and ankles bilaterally.  Neck: Inspection loss of lordosis anterior incision well-healed. No palpable stepoffs. Negative Spurling's maneuver. Loss of range of motion lacking the last 10 degrees of extension.  Minimal sidebending bilaterally.  Negative Spurling's. Grip strength and sensation normal in bilateral hands Patient does have some very mild weakness of the C8 distribution bilaterally but symmetric Negative Hoffman sign bilaterally Reflexes normal Mild tightness of the trapezius   Impression and Recommendations:     This case required medical decision making of moderate complexity.       Note: This dictation was prepared with Dragon dictation along with smaller phrase technology. Any transcriptional errors that result from this process are unintentional.

## 2018-05-05 ENCOUNTER — Ambulatory Visit: Payer: BC Managed Care – PPO | Admitting: Family Medicine

## 2018-05-05 ENCOUNTER — Encounter: Payer: Self-pay | Admitting: Family Medicine

## 2018-05-05 ENCOUNTER — Ambulatory Visit (INDEPENDENT_AMBULATORY_CARE_PROVIDER_SITE_OTHER)
Admission: RE | Admit: 2018-05-05 | Discharge: 2018-05-05 | Disposition: A | Discharge status: Home / Self Care | Payer: BC Managed Care – PPO | Source: Ambulatory Visit | Attending: Family Medicine | Admitting: Family Medicine

## 2018-05-05 VITALS — BP 122/92 | HR 64 | Ht 64.0 in | Wt 184.0 lb

## 2018-05-05 DIAGNOSIS — M542 Cervicalgia: Secondary | ICD-10-CM

## 2018-05-05 DIAGNOSIS — Z981 Arthrodesis status: Secondary | ICD-10-CM | POA: Diagnosis not present

## 2018-05-05 DIAGNOSIS — M5030 Other cervical disc degeneration, unspecified cervical region: Secondary | ICD-10-CM

## 2018-05-05 DIAGNOSIS — M4802 Spinal stenosis, cervical region: Secondary | ICD-10-CM

## 2018-05-05 HISTORY — DX: Spinal stenosis, cervical region: M48.02

## 2018-05-05 HISTORY — DX: Other cervical disc degeneration, unspecified cervical region: M50.30

## 2018-05-05 MED ORDER — GABAPENTIN 100 MG PO CAPS: 200.0000 mg | ORAL_CAPSULE | Freq: Every day | ORAL | 3 refills | Status: DC

## 2018-05-05 NOTE — Assessment & Plan Note (Signed)
Patient did have cervical spinal fusion.  Not having significant amount of pain but more pain in the forearms bilaterally.  Patient does not have any weakness or any numbness.  Will consider though the possibility of an EMG in the future.  Patient wants to start with conservative therapy including home exercises, icing regimen, gabapentin and will repeat x-rays.  Following up again in 4 weeks

## 2018-05-05 NOTE — Patient Instructions (Addendum)
Good to see you.  Ice 20 minutes 2 times daily. Usually after activity and before bed. Exercises 3 times a week.  Keep hands within peripheral vision  Gabapentin 200mg  at night New xray downstairs Turmeric 500mg  daily  Tart cherry extract any dose at night See me again in 4 week

## 2018-06-08 ENCOUNTER — Ambulatory Visit (INDEPENDENT_AMBULATORY_CARE_PROVIDER_SITE_OTHER)
Admission: RE | Admit: 2018-06-08 | Discharge: 2018-06-08 | Disposition: A | Discharge status: Home / Self Care | Payer: BC Managed Care – PPO | Source: Ambulatory Visit | Attending: Family Medicine | Admitting: Family Medicine

## 2018-06-08 ENCOUNTER — Ambulatory Visit: Payer: BC Managed Care – PPO | Admitting: Family Medicine

## 2018-06-08 ENCOUNTER — Encounter: Payer: Self-pay | Admitting: Family Medicine

## 2018-06-08 VITALS — BP 112/82 | HR 61 | Ht 64.0 in | Wt 187.0 lb

## 2018-06-08 DIAGNOSIS — M5412 Radiculopathy, cervical region: Secondary | ICD-10-CM

## 2018-06-08 DIAGNOSIS — M5416 Radiculopathy, lumbar region: Secondary | ICD-10-CM

## 2018-06-08 DIAGNOSIS — Z981 Arthrodesis status: Secondary | ICD-10-CM

## 2018-06-08 DIAGNOSIS — G5701 Lesion of sciatic nerve, right lower limb: Secondary | ICD-10-CM

## 2018-06-08 DIAGNOSIS — M5136 Other intervertebral disc degeneration, lumbar region: Secondary | ICD-10-CM

## 2018-06-08 DIAGNOSIS — M51369 Other intervertebral disc degeneration, lumbar region without mention of lumbar back pain or lower extremity pain: Secondary | ICD-10-CM

## 2018-06-08 DIAGNOSIS — G5700 Lesion of sciatic nerve, unspecified lower limb: Secondary | ICD-10-CM | POA: Diagnosis not present

## 2018-06-08 HISTORY — DX: Other intervertebral disc degeneration, lumbar region without mention of lumbar back pain or lower extremity pain: M51.369

## 2018-06-08 HISTORY — DX: Other intervertebral disc degeneration, lumbar region: M51.36

## 2018-06-08 NOTE — Progress Notes (Signed)
[name] [address], [place] [address] Phone: [phone] Subjective:   [name], am serving as a scribe for Dr. Hulan Saas.    CC: Neck pain and new onset back pain  [id number]  Brandi Carpenter is a 52 y.o. female coming in with complaint of neck pain. Feels the same as last visit. She is taking gabapentin and has been doing the exercises.  Patient since May be not has much tingling and not having the cramping at night.  Patient laboratory work-up was fairly unremarkable.  Patient though does have a ACDF of multiple level of the cervical spine.  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  This would be consistent with patient's symptoms.  She has been having lower back pain since last visit that seems to be radiating down to her foot. Constant pain in the right glute.  Mild radiation of the leg.  Certain activities seem to aggravate it.  Seems worse after sitting for long amount of time and then getting up.  Rates the severity pain is 7 out of 10.  Does not remember any true injury.     Past Medical History:  Diagnosis Date  . 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 Allergic rhinitis, cause unspecified   . Anxiety   . Complication of anesthesia    "difficulty awakening"  . Depression   . Headache   . Insomnia   . Vitamin D deficiency    Past Surgical History:  Procedure Laterality Date  . ANKLE SURGERY  child   left for torn ligaments, left  . ANTERIOR CERVICAL DECOMP/DISCECTOMY FUSION N/A 01/27/2016   Procedure: C5-6, C6-7 Anterior Cervical Discectomy and Fusion, Allograft, Plate;  Surgeon: Marybelle Killings, MD;  Location: [facility];  Service: Orthopedics;  Laterality: N/A;  . CERVICAL CONIZATION W/BX  2001  . NASAL SEPTOPLASTY W/ TURBINOPLASTY Bilateral 10/07/2015   Procedure: NASAL SEPTOPLASTY WITH BILATERAL TURBINATE  REDUCTION;  Surgeon: Leta Baptist, MD;  Location: [facility];  Service: ENT;  Laterality: Bilateral;  . PILONIDAL CYST EXCISION     Social History   Socioeconomic History  . Marital status: Single    Spouse name: Not on file  . Number of children: 0  . Years of education: 16  . Highest education level: Not on file  Occupational History  . Occupation: Naval architect: [employer]  Social Needs  . Financial resource strain: Not on file  . Food insecurity:    Worry: Not on file    Inability: Not on file  . Transportation needs:    Medical: Not on file    Non-medical: Not on file  Tobacco Use  . Smoking status: Never Smoker  . Smokeless tobacco: Never Used  Substance and Sexual Activity  . Alcohol use: No    Alcohol/week: 0.0 standard drinks  . Drug use: No  . Sexual activity: Never    Partners: Female    Birth control/protection: Abstinence  Lifestyle  . Physical activity:    Days per week: Not on file    Minutes per session: Not on file  . Stress: Not on file  Relationships  . Social connections:    Talks on phone: Not on file    Gets together: Not on file    Attends religious service: Not on file  Active member of club or organization: Not on file    Attends meetings of clubs or organizations: Not on file    Relationship status: Not on file  Other Topics Concern  . Not on file  Social History Narrative   Lives with female partner Juliann Pulse), 2 beagles   Caffeine use: drinks soda 3 drinks per day   Allergies  Allergen Reactions  . Codeine Itching and Nausea And Vomiting  . Pamelor [Nortriptyline] Tinitus   Family History  Problem Relation Age of Onset  . Hypertension Mother   . Diabetes Mother   . Heart disease Father   . Depression Sister   . Breast cancer Cousin   . Colon cancer Maternal Grandmother         died at 2  . Diabetes Maternal Grandmother   . Alzheimer's disease Paternal Grandmother   . Colon polyps Maternal Uncle     . Migraines Neg Hx     Current Outpatient Medications (Endocrine & Metabolic):  .  estradiol (CLIMARA - DOSED IN MG/24 HR) 0.05 mg/24hr patch, Place 1 patch (0.05 mg total) onto the skin once a week. .  progesterone (PROMETRIUM) 100 MG capsule, Take 1 capsule (100 mg total) by mouth daily.  Current Outpatient Medications (Cardiovascular):  Marland Kitchen  EPIPEN 2-PAK 0.3 MG/0.3ML SOAJ injection, Reported on 02/17/2016  Current Outpatient Medications (Respiratory):  .  fluticasone (FLONASE) 50 MCG/ACT nasal spray, INSTILL 2 SPRAYS INTO EACH NOSTRIL twice a day .  guaiFENesin (MUCINEX) 600 MG 12 hr tablet, Take 1,200 mg by mouth 2 (two) times daily. Marland Kitchen  levocetirizine (XYZAL) 5 MG tablet, TAKE 1 TABLET BY MOUTH EVERY EVENING .  Olopatadine HCl (PATANASE) 0.6 % SOLN, Place 1-2 each into the nose 2 (two) times daily. Reported on 11/11/2015    Current Outpatient Medications (Other):  Marland Kitchen  ALPRAZolam (XANAX) 0.5 MG tablet, take 1/2 to 1 tablet by mouth three times a day if needed for anxiety .  buPROPion (WELLBUTRIN XL) 300 MG 24 hr tablet, take 1 tablet by mouth every morning .  Cholecalciferol (VITAMIN D) 2000 UNITS tablet, Take 2,000 Units by mouth daily. .  citalopram (CELEXA) 20 MG tablet, take 1 tablet by mouth once daily .  gabapentin (NEURONTIN) 100 MG capsule, Take 2 capsules (200 mg total) by mouth at bedtime. Marland Kitchen  olopatadine (PATANOL) 0.1 % ophthalmic solution, place 1 drop into both eyes twice a day .  Omega-3 Fatty Acids (FISH OIL) 1200 MG CAPS, Take 1,200 capsules by mouth. Marland Kitchen  OVER THE COUNTER MEDICATION, Take 1 tablet by mouth 2 (two) times daily.  Marland Kitchen  zolpidem (AMBIEN) 10 MG tablet, Take 0.5-1 tablets (5-10 mg total) by mouth at bedtime as needed for sleep.    Past medical history, social, surgical and family history all reviewed in electronic medical record.  No pertanent information unless stated regarding to the chief complaint.   Review of Systems:  No headache, visual changes, nausea,  vomiting, diarrhea, constipation, dizziness, abdominal pain, skin rash, fevers, chills, night sweats, weight loss, swollen lymph nodes, body aches, joint swelling, muscle aches, chest pain, shortness of breath, mood changes.   Objective  Blood pressure 112/82, pulse 61, height 5\' 4"  (1.626 m), weight 187 lb (84.8 kg), last menstrual period 01/06/2016, SpO2 95 %.   General: No apparent distress alert and oriented x3 mood and affect normal, dressed appropriately.  HEENT: Pupils equal, extraocular movements intact  Respiratory: Patient's speak in full sentences and does not appear short of breath  Cardiovascular: No lower extremity edema, non tender, no erythema  Skin: Warm dry intact with no signs of infection or rash on extremities or on axial skeleton.  Abdomen: Soft nontender  Neuro: Cranial nerves II through XII are intact, neurovascularly intact in all extremities with 2+ DTRs and 2+ pulses.  Lymph: No lymphadenopathy of posterior or anterior cervical chain or axillae bilaterally.  Gait normal with good balance and coordination.  MSK:  Non tender with full range of motion and good stability and symmetric strength and tone of shoulders, elbows, wrist, hip, knee and ankles bilaterally.  Neck exam shows loss of lordosis.  Anterior incision well-healed.  Patient does have some loss of range of motion in all planes. Mild positive Spurling's more to the elbows. Patient is low back and does have a positive Corky Sox.  Negative straight leg test.  Tenderness to palpation more in the gluteal area.  Minimal discomfort over the paraspinal musculature of the lumbar spine   Impression and Recommendations:     This case required medical decision making of moderate complexity. The above documentation has been reviewed and is accurate and complete Lyndal Pulley, DO       Note: This dictation was prepared with Dragon dictation along with smaller phrase technology. Any transcriptional errors that result  from this process are unintentional.

## 2018-06-08 NOTE — Assessment & Plan Note (Signed)
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  Encourage patient to continue the gabapentin, we discussed that we can make medication changes with patient declined but is willing to go to formal physical therapy.  Discussed icing regimen and home exercises.  Discussed which activities to do which wants to avoid.  Patient is to increase activity as tolerated slowly.  Worsening symptoms CT myelogram will be necessary.  Follow-up again in 4 to 6 weeks.  Will be traveling to [place] in November

## 2018-06-08 NOTE — Assessment & Plan Note (Signed)
Piriformis Syndrome  Using an anatomical model, reviewed with the patient the structures involved and how they related to diagnosis. The patient indicated understanding.   The patient was given a handout from Dr. Rouzier's book "The Sports Medicine Patient Advisor" describing the anatomy and rehabilitation of the following condition: Piriformis Syndrome  Also given a handout with more extensive Piriformis stretching, hip flexor and abductor strengthening, ham stretching  Rec deep massage, explained self-massage with ball RTC in 4 weeks 

## 2018-06-08 NOTE — Patient Instructions (Addendum)
  Good to see you  Pt with art will be great  Tennis ball in back right pocket with sitting OCntinue the vitamins Ice is your friend See me again in 4 weeks to make sure you are doing better or if we need CT of the neck  Xray of back downstairs

## 2018-06-27 ENCOUNTER — Other Ambulatory Visit: Payer: Self-pay | Admitting: Family Medicine

## 2018-06-27 DIAGNOSIS — G4700 Insomnia, unspecified: Secondary | ICD-10-CM

## 2018-06-28 NOTE — Telephone Encounter (Signed)
Is this ok to refill?  

## 2018-07-10 NOTE — Progress Notes (Signed)
[name] [address] [place], [place] [address] Phone: [phone] Subjective:    I Brandi Carpenter am serving as a Education administrator for Dr. Hulan Saas.  CC: Back pain follow-up  [id number]  Brandi Carpenter is a 52 y.o. female coming in with complaint of back pain. States she's been going to therapy and doing dry needling. Feels about the same.  Patient has done physical therapy.  Continues to have radiation down the leg.  Patient states that sometimes the numbness seems to be more constant.  Patient feels that she does not have a day without any pain anymore.  Patient is concerned with this over the course of time.  Feels very similar to what she was having with her neck before she had surgery.  Also new onset of right arm pain.  Seems to be more around the right elbow.  Rates the severity pain is 6 out of 10.  States worse with repetitive activity.  Sometimes can hurt her at night.  Does not remember any true injury.  Works on a computer a lot    Past Medical History:  Diagnosis Date  . 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 Allergic rhinitis, cause unspecified   . Anxiety   . Complication of anesthesia    "difficulty awakening"  . Depression   . Headache   . Insomnia   . Vitamin D deficiency    Past Surgical History:  Procedure Laterality Date  . ANKLE SURGERY  child   left for torn ligaments, left  . ANTERIOR CERVICAL DECOMP/DISCECTOMY FUSION N/A 01/27/2016   Procedure: C5-6, C6-7 Anterior Cervical Discectomy and Fusion, Allograft, Plate;  Surgeon: Marybelle Killings, MD;  Location: [facility];  Service: Orthopedics;  Laterality: N/A;  . CERVICAL CONIZATION W/BX  2001  . NASAL SEPTOPLASTY W/ TURBINOPLASTY Bilateral 10/07/2015   Procedure: NASAL SEPTOPLASTY WITH BILATERAL TURBINATE REDUCTION;  Surgeon: Leta Baptist, MD;  Location: [facility];  Service: ENT;  Laterality: Bilateral;  . PILONIDAL CYST EXCISION     Social  History   Socioeconomic History  . Marital status: Single    Spouse name: Not on file  . Number of children: 0  . Years of education: 57  . Highest education level: Not on file  Occupational History  . Occupation: Naval architect: [employer]  Social Needs  . Financial resource strain: Not on file  . Food insecurity:    Worry: Not on file    Inability: Not on file  . Transportation needs:    Medical: Not on file    Non-medical: Not on file  Tobacco Use  . Smoking status: Never Smoker  . Smokeless tobacco: Never Used  Substance and Sexual Activity  . Alcohol use: No    Alcohol/week: 0.0 standard drinks  . Drug use: No  . Sexual activity: Never    Partners: Female    Birth control/protection: Abstinence  Lifestyle  . Physical activity:    Days per week: Not on file    Minutes per session: Not on file  . Stress: Not on file  Relationships  . Social connections:    Talks on phone: Not on file    Gets together: Not on file    Attends religious service: Not on file    Active member of club or organization: Not on file    Attends meetings of clubs or organizations: Not on file  Relationship status: Not on file  Other Topics Concern  . Not on file  Social History Narrative   Lives with female partner Juliann Pulse), 2 beagles   Caffeine use: drinks soda 3 drinks per day   Allergies  Allergen Reactions  . Codeine Itching and Nausea And Vomiting  . Pamelor [Nortriptyline] Tinitus   Family History  Problem Relation Age of Onset  . Hypertension Mother   . Diabetes Mother   . Heart disease Father   . Depression Sister   . Breast cancer Cousin   . Colon cancer Maternal Grandmother         died at 41  . Diabetes Maternal Grandmother   . Alzheimer's disease Paternal Grandmother   . Colon polyps Maternal Uncle   . Migraines Neg Hx     Current Outpatient Medications (Endocrine & Metabolic):  .  estradiol (CLIMARA - DOSED IN MG/24 HR) 0.05 mg/24hr patch,  Place 1 patch (0.05 mg total) onto the skin once a week. .  progesterone (PROMETRIUM) 100 MG capsule, Take 1 capsule (100 mg total) by mouth daily.  Current Outpatient Medications (Cardiovascular):  Marland Kitchen  EPIPEN 2-PAK 0.3 MG/0.3ML SOAJ injection, Reported on 02/17/2016  Current Outpatient Medications (Respiratory):  .  fluticasone (FLONASE) 50 MCG/ACT nasal spray, INSTILL 2 SPRAYS INTO EACH NOSTRIL twice a day .  guaiFENesin (MUCINEX) 600 MG 12 hr tablet, Take 1,200 mg by mouth 2 (two) times daily. Marland Kitchen  levocetirizine (XYZAL) 5 MG tablet, TAKE 1 TABLET BY MOUTH EVERY EVENING .  Olopatadine HCl (PATANASE) 0.6 % SOLN, Place 1-2 each into the nose 2 (two) times daily. Reported on 11/11/2015    Current Outpatient Medications (Other):  Marland Kitchen  ALPRAZolam (XANAX) 0.5 MG tablet, take 1/2 to 1 tablet by mouth three times a day if needed for anxiety .  buPROPion (WELLBUTRIN XL) 300 MG 24 hr tablet, take 1 tablet by mouth every morning .  Cholecalciferol (VITAMIN D) 2000 UNITS tablet, Take 2,000 Units by mouth daily. .  citalopram (CELEXA) 20 MG tablet, take 1 tablet by mouth once daily .  gabapentin (NEURONTIN) 100 MG capsule, Take 2 capsules (200 mg total) by mouth at bedtime. Marland Kitchen  olopatadine (PATANOL) 0.1 % ophthalmic solution, place 1 drop into both eyes twice a day .  Omega-3 Fatty Acids (FISH OIL) 1200 MG CAPS, Take 1,200 capsules by mouth. Marland Kitchen  OVER THE COUNTER MEDICATION, Take 1 tablet by mouth 2 (two) times daily.  Marland Kitchen  zolpidem (AMBIEN) 10 MG tablet, TAKE 1/2 TO 1 TABLET(5 TO 10 MG) BY MOUTH AT BEDTIME AS NEEDED FOR SLEEP    Past medical history, social, surgical and family history all reviewed in electronic medical record.  No pertanent information unless stated regarding to the chief complaint.   Review of Systems:  No headache, visual changes, nausea, vomiting, diarrhea, constipation, dizziness, abdominal pain, skin rash, fevers, chills, night sweats, weight loss, swollen lymph nodes, body aches,  joint swelling,  chest pain, shortness of breath, mood changes.  Positive muscle aches  Objective  Blood pressure (!) 146/90, pulse 65, height 5\' 4"  (1.626 m), weight 186 lb (84.4 kg), last menstrual period 01/06/2016, SpO2 98 %.   General: No apparent distress alert and oriented x3 mood and affect normal, dressed appropriately.  HEENT: Pupils equal, extraocular movements intact  Respiratory: Patient's speak in full sentences and does not appear short of breath  Cardiovascular: No lower extremity edema, non tender, no erythema  Skin: Warm dry intact with no signs of infection or  rash on extremities or on axial skeleton.  Abdomen: Soft nontender  Neuro: Cranial nerves II through XII are intact, neurovascularly intact in all extremities with 2+ DTRs and 2+ pulses.  Lymph: No lymphadenopathy of posterior or anterior cervical chain or axillae bilaterally.  Gait normal with good balance and coordination.  MSK:  Non tender with full range of motion and good stability and symmetric strength and tone of shoulders, elbows, wrist, hip, knee and ankles bilaterally.  Back Exam:  Inspection: Loss of lordosis Motion: Flexion 40 deg, Extension 25 deg, Side Bending to 45 deg bilaterally,  Rotation to 45 deg bilaterally  SLR laying: Positive on the right side at 20 degrees of flexion XSLR laying: Negative  Palpable tenderness: None. FABER: Positive Faber on the right. Sensory change: Gross sensation intact to all lumbar and sacral dermatomes.  Reflexes: 2+ at both patellar tendons, 2+ at achilles tendons, Babinski's downgoing.  Strength at foot  4-5 strength with plantarflexion and dorsiflexion on the right side compared to the left    Impression and Recommendations:     This case required medical decision making of moderate complexity. The above documentation has been reviewed and is accurate and complete Lyndal Pulley, DO       Note: This dictation was prepared with Dragon dictation along  with smaller phrase technology. Any transcriptional errors that result from this process are unintentional.

## 2018-07-12 ENCOUNTER — Ambulatory Visit: Payer: BC Managed Care – PPO | Admitting: Family Medicine

## 2018-07-12 ENCOUNTER — Encounter: Payer: Self-pay | Admitting: Family Medicine

## 2018-07-12 VITALS — BP 146/90 | HR 65 | Ht 64.0 in | Wt 186.0 lb

## 2018-07-12 DIAGNOSIS — M7711 Lateral epicondylitis, right elbow: Secondary | ICD-10-CM | POA: Insufficient documentation

## 2018-07-12 DIAGNOSIS — M5416 Radiculopathy, lumbar region: Secondary | ICD-10-CM | POA: Diagnosis not present

## 2018-07-12 NOTE — Assessment & Plan Note (Signed)
Worsening radicular symptoms at this time.  Working diagnosis has been more of the piriformis but seems to have some mild weakness now of the lower extremity.  Do believe that with patient failing all conservative therapy including 6 weeks of formal physical therapy advanced imaging would be warranted.  Patient would be a candidate for an epidural steroid injection.  Depending on findings the also differential includes still the piriformis syndrome and could consider possible injections in the long run.  Continue the same medications at this time.  Follow-up again after imaging.

## 2018-07-12 NOTE — Assessment & Plan Note (Signed)
Lateral Epicondylitis: Elbow anatomy was reviewed, and tendinopathy was explained.  Pt. given a formal rehab program. Series of concentric and eccentric exercises should be done starting with no weight, work up to 1 lb, hammer, etc.  Use counterforce strap if working or using hands.  Formal PT would be beneficial. Emphasized stretching an cross-friction massage Emphasized proper palms up lifting biomechanics to unload ECRB patient was given a wrist immobilizer.  Follow-up again in 4 weeks

## 2018-07-12 NOTE — Patient Instructions (Signed)
Good to see you  Brandi Carpenter is your friend We will get MRI of your back  Call [phone] to schedule  Continue the same meds PT as much as you want Depending on the MRI we  Will discuss epidural or piriformis injection  Do not lift overhand  Have one more appointment with me set up before you leave

## 2018-07-22 NOTE — Progress Notes (Signed)
[name] [place],  [address] Phone: [phone] Subjective:    I Kandace Blitz am serving as a Education administrator for Dr. Hulan Saas.   CC: Back pain follow-up  [id number]  Brandi Carpenter is a 52 y.o. female coming in with complaint of back pain. States the back is doing bad. Here for MRI results. Has been going to PT but it hasn't been helping.  Patient back pain continues to seem to be worsening.  Affecting daily activities and even waking her up at night.  Sent for an MRI.  MRI independently visualized by me.  Showed moderate to severe degenerative disc disease at multiple levels and multifactorial degenerative changes at L4-L5 mild to moderate spinal stenosis as well as bilateral L4 foraminal narrowing.  Patient also had a disc protrusion at L3-L4 causing a left L3 nerve root impingement.      Past Medical History:  Diagnosis Date  . 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 Allergic rhinitis, cause unspecified   . Anxiety   . Complication of anesthesia    "difficulty awakening"  . Depression   . Headache   . Insomnia   . Vitamin D deficiency    Past Surgical History:  Procedure Laterality Date  . ANKLE SURGERY  child   left for torn ligaments, left  . ANTERIOR CERVICAL DECOMP/DISCECTOMY FUSION N/A 01/27/2016   Procedure: C5-6, C6-7 Anterior Cervical Discectomy and Fusion, Allograft, Plate;  Surgeon: Marybelle Killings, MD;  Location: [facility];  Service: Orthopedics;  Laterality: N/A;  . CERVICAL CONIZATION W/BX  2001  . NASAL SEPTOPLASTY W/ TURBINOPLASTY Bilateral 10/07/2015   Procedure: NASAL SEPTOPLASTY WITH BILATERAL TURBINATE REDUCTION;  Surgeon: Leta Baptist, MD;  Location: [facility];  Service: ENT;  Laterality: Bilateral;  . PILONIDAL CYST EXCISION     Social History   Socioeconomic History  . Marital status: Single    Spouse name: Not on file  . Number of children: 0  . Years of  education: 48  . Highest education level: Not on file  Occupational History  . Occupation: Naval architect: [employer]  Social Needs  . Financial resource strain: Not on file  . Food insecurity:    Worry: Not on file    Inability: Not on file  . Transportation needs:    Medical: Not on file    Non-medical: Not on file  Tobacco Use  . Smoking status: Never Smoker  . Smokeless tobacco: Never Used  Substance and Sexual Activity  . Alcohol use: No    Alcohol/week: 0.0 standard drinks  . Drug use: No  . Sexual activity: Never    Partners: Female    Birth control/protection: Abstinence  Lifestyle  . Physical activity:    Days per week: Not on file    Minutes per session: Not on file  . Stress: Not on file  Relationships  . Social connections:    Talks on phone: Not on file    Gets together: Not on file    Attends religious service: Not on file    Active member of club or organization: Not on file    Attends meetings of clubs or organizations: Not on file    Relationship status: Not on file  Other Topics Concern  . Not on file  Social History Narrative   Lives with female partner Juliann Pulse), 2 beagles   Caffeine use: drinks  soda 3 drinks per day   Allergies  Allergen Reactions  . Codeine Itching and Nausea And Vomiting  . Pamelor [Nortriptyline] Tinitus   Family History  Problem Relation Age of Onset  . Hypertension Mother   . Diabetes Mother   . Heart disease Father   . Depression Sister   . Breast cancer Cousin   . Colon cancer Maternal Grandmother         died at 70  . Diabetes Maternal Grandmother   . Alzheimer's disease Paternal Grandmother   . Colon polyps Maternal Uncle   . Migraines Neg Hx     Current Outpatient Medications (Endocrine & Metabolic):  .  estradiol (CLIMARA - DOSED IN MG/24 HR) 0.05 mg/24hr patch, Place 1 patch (0.05 mg total) onto the skin once a week. .  progesterone (PROMETRIUM) 100 MG capsule, Take 1 capsule (100 mg total)  by mouth daily. .  predniSONE (DELTASONE) 50 MG tablet, Take 1 tablet (50 mg total) by mouth daily.  Current Outpatient Medications (Cardiovascular):  Marland Kitchen  EPIPEN 2-PAK 0.3 MG/0.3ML SOAJ injection, Reported on 02/17/2016  Current Outpatient Medications (Respiratory):  .  fluticasone (FLONASE) 50 MCG/ACT nasal spray, INSTILL 2 SPRAYS INTO EACH NOSTRIL twice a day .  guaiFENesin (MUCINEX) 600 MG 12 hr tablet, Take 1,200 mg by mouth 2 (two) times daily. Marland Kitchen  levocetirizine (XYZAL) 5 MG tablet, TAKE 1 TABLET BY MOUTH EVERY EVENING    Current Outpatient Medications (Other):  Marland Kitchen  ALPRAZolam (XANAX) 0.5 MG tablet, TAKE 1/2 TO 1 TABLET BY MOUTH THREE TIMES DAILY AS NEEDED FOR ANXIETY .  buPROPion (WELLBUTRIN XL) 300 MG 24 hr tablet, take 1 tablet by mouth every morning .  Cholecalciferol (VITAMIN D) 2000 UNITS tablet, Take 2,000 Units by mouth daily. .  citalopram (CELEXA) 20 MG tablet, take 1 tablet by mouth once daily .  gabapentin (NEURONTIN) 100 MG capsule, Take 2 capsules (200 mg total) by mouth at bedtime. Marland Kitchen  olopatadine (PATANOL) 0.1 % ophthalmic solution, place 1 drop into both eyes twice a day .  OVER THE COUNTER MEDICATION, Take 1 tablet by mouth 2 (two) times daily.  Marland Kitchen  zolpidem (AMBIEN) 10 MG tablet, TAKE 1/2 TO 1 TABLET(5 TO 10 MG) BY MOUTH AT BEDTIME AS NEEDED FOR SLEEP    Past medical history, social, surgical and family history all reviewed in electronic medical record.  No pertanent information unless stated regarding to the chief complaint.   Review of Systems:  No headache, visual changes, nausea, vomiting, diarrhea, constipation, dizziness, abdominal pain, skin rash, fevers, chills, night sweats, weight loss, swollen lymph nodes, body aches, joint swelling, hest pain, shortness of breath, mood changes.  Positive muscle aches  Objective  Blood pressure 124/84, pulse 70, height 5\' 4"  (1.626 m), weight 187 lb (84.8 kg), last menstrual period 01/06/2016, SpO2 97 %.    General: No  apparent distress alert and oriented x3 mood and affect normal, dressed appropriately.  HEENT: Pupils equal, extraocular movements intact  Respiratory: Patient's speak in full sentences and does not appear short of breath  Cardiovascular: No lower extremity edema, non tender, no erythema  Skin: Warm dry intact with no signs of infection or rash on extremities or on axial skeleton.  Abdomen: Soft nontender  Neuro: Cranial nerves II through XII are intact, neurovascularly intact in all extremities with 2+ DTRs and 2+ pulses.  Lymph: No lymphadenopathy of posterior or anterior cervical chain or axillae bilaterally.  Gait normal with good balance and coordination.  MSK:  Non tender with full range of motion and good stability and symmetric strength and tone of shoulders, elbows, wrist, hip, knee and ankles bilaterally.  Arthritic changes of multiple joints  Patient back exam has loss of lordosis.  Mild positive straight leg test bilaterally.  Positive Faber test on the right side.  Tender to palpation severely in the paraspinal musculature lumbar spine.  Deep tendon reflexes though appear to be intact today.     Impression and Recommendations:     This case required medical decision making of moderate complexity. The above documentation has been reviewed and is accurate and complete Lyndal Pulley, DO       Note: This dictation was prepared with Dragon dictation along with smaller phrase technology. Any transcriptional errors that result from this process are unintentional.

## 2018-07-23 ENCOUNTER — Ambulatory Visit
Admission: RE | Admit: 2018-07-23 | Discharge: 2018-07-23 | Disposition: A | Discharge status: Home / Self Care | Payer: BC Managed Care – PPO | Source: Ambulatory Visit | Attending: Family Medicine | Admitting: Family Medicine

## 2018-07-23 DIAGNOSIS — M5416 Radiculopathy, lumbar region: Secondary | ICD-10-CM

## 2018-07-24 ENCOUNTER — Other Ambulatory Visit: Payer: Self-pay | Admitting: Family Medicine

## 2018-07-24 DIAGNOSIS — F411 Generalized anxiety disorder: Secondary | ICD-10-CM

## 2018-07-25 ENCOUNTER — Encounter: Payer: Self-pay | Admitting: Family Medicine

## 2018-07-25 ENCOUNTER — Ambulatory Visit: Payer: BC Managed Care – PPO | Admitting: Family Medicine

## 2018-07-25 VITALS — BP 124/84 | HR 70 | Ht 64.0 in | Wt 187.0 lb

## 2018-07-25 DIAGNOSIS — M5416 Radiculopathy, lumbar region: Secondary | ICD-10-CM

## 2018-07-25 DIAGNOSIS — M545 Low back pain, unspecified: Secondary | ICD-10-CM

## 2018-07-25 MED ORDER — PREDNISONE 50 MG PO TABS: 50.0000 mg | ORAL_TABLET | Freq: Every day | ORAL | 0 refills | Status: DC

## 2018-07-25 NOTE — Telephone Encounter (Signed)
[place].  Last filled 8/2. Done

## 2018-07-25 NOTE — Assessment & Plan Note (Signed)
Discussed with patient as well as her partner at great length today.  We discussed with patient about different treatment options including repeating formal physical therapy, steroids, epidurals, or surgical intervention.  Patient has elected to try epidural which was ordered today.  We discussed the possibility that if this fails it with her having traveling prednisone daily for 1 week could also be beneficial.  We discussed icing regimen and home exercises.  We discussed that we would continue to try to increase stability over the course of time.  Follow-up again in 4 to 8 weeks.  Spent  25 minutes with patient face-to-face and had greater than 50% of counseling including as described above in assessment and plan.

## 2018-07-25 NOTE — Patient Instructions (Addendum)
Good to see you  We will order epidural and call [phone]  Ice is your friend If does not work take the prednisone daily for 7 days Have a great trip see me again in 3 weeks AFTER the injection

## 2018-07-25 NOTE — Telephone Encounter (Signed)
Is this okay to refill? 

## 2018-07-26 ENCOUNTER — Other Ambulatory Visit: Payer: Self-pay | Admitting: Family Medicine

## 2018-07-26 ENCOUNTER — Ambulatory Visit
Admission: RE | Admit: 2018-07-26 | Discharge: 2018-07-26 | Disposition: A | Discharge status: Home / Self Care | Payer: BC Managed Care – PPO | Source: Ambulatory Visit | Attending: Family Medicine | Admitting: Family Medicine

## 2018-07-26 DIAGNOSIS — M545 Low back pain, unspecified: Secondary | ICD-10-CM

## 2018-07-26 MED ORDER — IOPAMIDOL (ISOVUE-M 200) INJECTION 41%
1.0000 mL | Freq: Once | INTRAMUSCULAR | Status: AC
  Administered 2018-07-26: 1 mL via EPIDURAL

## 2018-07-26 MED ORDER — METHYLPREDNISOLONE ACETATE 40 MG/ML INJ SUSP (RADIOLOG
120.0000 mg | Freq: Once | INTRAMUSCULAR | Status: AC
  Administered 2018-07-26: 120 mg via EPIDURAL

## 2018-07-26 NOTE — Discharge Instructions (Signed)

## 2018-07-29 ENCOUNTER — Encounter: Payer: Self-pay | Admitting: Family Medicine

## 2018-07-31 ENCOUNTER — Encounter: Payer: Self-pay | Admitting: Family Medicine

## 2018-08-01 ENCOUNTER — Other Ambulatory Visit: Payer: Self-pay | Admitting: Family Medicine

## 2018-08-01 DIAGNOSIS — G4700 Insomnia, unspecified: Secondary | ICD-10-CM

## 2018-08-01 MED ORDER — PREDNISONE 50 MG PO TABS: 50.0000 mg | ORAL_TABLET | Freq: Every day | ORAL | 0 refills | Status: DC

## 2018-08-01 NOTE — Telephone Encounter (Signed)
Refilled;  Has appt in December, so didn't put refills

## 2018-08-01 NOTE — Telephone Encounter (Signed)
Is this okay to refill? 

## 2018-08-18 NOTE — Progress Notes (Signed)
[facility] [place], [place] [address] Phone: [phone] Subjective:   [name], am serving as a scribe for Dr. Hulan Saas.  I'm seeing this patient by the request  of:    CC: Back pain follow-up  Brandi Carpenter  Brandi Carpenter is a 52 y.o. female coming in with complaint of back pain. She had epidural and had only one day of relief. She finished prednisone but is done with that course. Pain in morning and evening. continues to have radiating symptoms down right leg to right foot.  Patient was found to have a S1 nerve root impingement previously.    Past Medical History:  Diagnosis Date  . 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 Allergic rhinitis, cause unspecified   . Anxiety   . Complication of anesthesia    "difficulty awakening"  . Depression   . Headache   . Insomnia   . Vitamin D deficiency    Past Surgical History:  Procedure Laterality Date  . ANKLE SURGERY  child   left for torn ligaments, left  . ANTERIOR CERVICAL DECOMP/DISCECTOMY FUSION N/A 01/27/2016   Procedure: C5-6, C6-7 Anterior Cervical Discectomy and Fusion, Allograft, Plate;  Surgeon: Marybelle Killings, MD;  Location: [facility];  Service: Orthopedics;  Laterality: N/A;  . CERVICAL CONIZATION W/BX  2001  . NASAL SEPTOPLASTY W/ TURBINOPLASTY Bilateral 10/07/2015   Procedure: NASAL SEPTOPLASTY WITH BILATERAL TURBINATE REDUCTION;  Surgeon: Leta Baptist, MD;  Location: [facility];  Service: ENT;  Laterality: Bilateral;  . PILONIDAL CYST EXCISION     Social History   Socioeconomic History  . Marital status: Single    Spouse name: Not on file  . Number of children: 0  . Years of education: 83  . Highest education level: Not on file  Occupational History  . Occupation: Naval architect: [employer]  Social Needs  . Financial resource strain: Not on file  . Food insecurity:    Worry: Not on file    Inability:  Not on file  . Transportation needs:    Medical: Not on file    Non-medical: Not on file  Tobacco Use  . Smoking status: Never Smoker  . Smokeless tobacco: Never Used  Substance and Sexual Activity  . Alcohol use: No    Alcohol/week: 0.0 standard drinks  . Drug use: No  . Sexual activity: Never    Partners: Female    Birth control/protection: Abstinence  Lifestyle  . Physical activity:    Days per week: Not on file    Minutes per session: Not on file  . Stress: Not on file  Relationships  . Social connections:    Talks on phone: Not on file    Gets together: Not on file    Attends religious service: Not on file    Active member of club or organization: Not on file    Attends meetings of clubs or organizations: Not on file    Relationship status: Not on file  Other Topics Concern  . Not on file  Social History Narrative   Lives with female partner Juliann Pulse), 2 beagles   Caffeine use: drinks soda 3 drinks per day   Allergies  Allergen Reactions  . Codeine Itching and Nausea And Vomiting  . Pamelor [Nortriptyline] Tinitus   Family History  Problem Relation Age of Onset  . Hypertension Mother   . Diabetes  Mother   . Heart disease Father   . Depression Sister   . Breast cancer Cousin   . Colon cancer Maternal Grandmother         died at 1  . Diabetes Maternal Grandmother   . Alzheimer's disease Paternal Grandmother   . Colon polyps Maternal Uncle   . Migraines Neg Hx     Current Outpatient Medications (Endocrine & Metabolic):  .  estradiol (CLIMARA - DOSED IN MG/24 HR) 0.05 mg/24hr patch, Place 1 patch (0.05 mg total) onto the skin once a week. .  predniSONE (DELTASONE) 50 MG tablet, Take 1 tablet (50 mg total) by mouth daily. .  progesterone (PROMETRIUM) 100 MG capsule, Take 1 capsule (100 mg total) by mouth daily.  Current Outpatient Medications (Cardiovascular):  Marland Kitchen  EPIPEN 2-PAK 0.3 MG/0.3ML SOAJ injection, Reported on 02/17/2016  Current Outpatient Medications  (Respiratory):  .  fluticasone (FLONASE) 50 MCG/ACT nasal spray, INSTILL 2 SPRAYS INTO EACH NOSTRIL twice a day .  guaiFENesin (MUCINEX) 600 MG 12 hr tablet, Take 1,200 mg by mouth 2 (two) times daily. Marland Kitchen  levocetirizine (XYZAL) 5 MG tablet, TAKE 1 TABLET BY MOUTH EVERY EVENING    Current Outpatient Medications (Other):  Marland Kitchen  ALPRAZolam (XANAX) 0.5 MG tablet, TAKE 1/2 TO 1 TABLET BY MOUTH THREE TIMES DAILY AS NEEDED FOR ANXIETY .  buPROPion (WELLBUTRIN XL) 300 MG 24 hr tablet, take 1 tablet by mouth every morning .  Cholecalciferol (VITAMIN D) 2000 UNITS tablet, Take 2,000 Units by mouth daily. .  citalopram (CELEXA) 20 MG tablet, take 1 tablet by mouth once daily .  gabapentin (NEURONTIN) 100 MG capsule, Take 2 capsules (200 mg total) by mouth at bedtime. Marland Kitchen  olopatadine (PATANOL) 0.1 % ophthalmic solution, place 1 drop into both eyes twice a day .  OVER THE COUNTER MEDICATION, Take 1 tablet by mouth 2 (two) times daily.  Marland Kitchen  zolpidem (AMBIEN) 10 MG tablet, TAKE 1/2 TO 1 TABLET(5 TO 10 MG) BY MOUTH AT BEDTIME AS NEEDED FOR SLEEP    Past medical history, social, surgical and family history all reviewed in electronic medical record.  No pertanent information unless stated regarding to the chief complaint.   Review of Systems:  No headache, visual changes, nausea, vomiting, diarrhea, constipation, dizziness, abdominal pain, skin rash, fevers, chills, night sweats, weight loss, swollen lymph nodes, body aches, joint swelling, chest pain, shortness of breath, mood changes.  Positive muscle aches  Objective  Blood pressure 122/82, pulse 73, height 5\' 4"  (1.626 m), weight 180 lb (81.6 kg), last menstrual period 01/06/2016, SpO2 97 %.    General: No apparent distress alert and oriented x3 mood and affect normal, dressed appropriately.  HEENT: Pupils equal, extraocular movements intact  Respiratory: Patient's speak in full sentences and does not appear short of breath  Cardiovascular: No lower  extremity edema, non tender, no erythema  Skin: Warm dry intact with no signs of infection or rash on extremities or on axial skeleton.  Abdomen: Soft nontender  Neuro: Cranial nerves II through XII are intact, neurovascularly intact in all extremities with 2+ DTRs and 2+ pulses.  Lymph: No lymphadenopathy of posterior or anterior cervical chain or axillae bilaterally.  Gait normal with good balance and coordination.  MSK:  Non tender with full range of motion and good stability and symmetric strength and tone of shoulders, elbows, wrist, hip, knee and ankles bilaterally.  Back Exam:  Inspection: Loss of lordosis Motion: Flexion 35 deg, Extension 25 deg, Side Bending  to 35 deg bilaterally,  Rotation to 35 deg bilaterally  SLR laying: Positive right XSLR laying: Negative  Palpable tenderness: Patient with the paraspinal musculature) left. FABER: Positive Faber on the right. Sensory change: Gross sensation intact to all lumbar and sacral dermatomes.  Reflexes: 2+ at both patellar tendons, 2+ at achilles tendons, Babinski's downgoing.  Strength at foot  Plantar-flexion: 5/5 Dorsi-flexion: 5/5 Eversion: 5/5 Inversion: 5/5  Leg strength  Quad: 5/5 Hamstring: 5/5 Hip flexor: 5/5 Hip abductors: 4/5 symmetric     Impression and Recommendations:      The above documentation has been reviewed and is accurate and complete Lyndal Pulley, DO       Note: This dictation was prepared with Dragon dictation along with smaller phrase technology. Any transcriptional errors that result from this process are unintentional.

## 2018-08-19 ENCOUNTER — Other Ambulatory Visit: Payer: Self-pay | Admitting: Family Medicine

## 2018-08-19 ENCOUNTER — Ambulatory Visit: Payer: BC Managed Care – PPO | Admitting: Family Medicine

## 2018-08-19 ENCOUNTER — Encounter: Payer: Self-pay | Admitting: Family Medicine

## 2018-08-19 VITALS — BP 122/82 | HR 73 | Ht 64.0 in | Wt 180.0 lb

## 2018-08-19 DIAGNOSIS — J309 Allergic rhinitis, unspecified: Secondary | ICD-10-CM

## 2018-08-19 DIAGNOSIS — M5416 Radiculopathy, lumbar region: Secondary | ICD-10-CM

## 2018-08-19 NOTE — Assessment & Plan Note (Signed)
Patient does have multifactorial spinal stenosis as well as a right-sided S1 nerve impingement that is consistent with patient's symptoms.  Continues to have the radicular symptoms and positive straight leg test.  We discussed different treatment options including repeating formal physical therapy, possible referral for radiofrequency ablation, possible referral for surgical intervention or possibly repeating the nerve root impingement injection.  Patient has decided to do this as well.  Patient will follow-up with me 3 weeks after the injection.

## 2018-08-19 NOTE — Patient Instructions (Addendum)
Good to see you  I am glad the epidural did help for a day  We will repeat again and call them at [phone] to [email or web address] Stay active Send a message 2 weeks after the epidural Have an appointment with me set up in 4-6 weeks just in case  Happy holidays!

## 2018-08-22 ENCOUNTER — Other Ambulatory Visit: Payer: Self-pay

## 2018-08-22 MED ORDER — GABAPENTIN 100 MG PO CAPS: 200.0000 mg | ORAL_CAPSULE | Freq: Every day | ORAL | 1 refills | Status: DC

## 2018-08-24 NOTE — Progress Notes (Signed)
Chief Complaint  Patient presents with  . Annual Exam    fasting annual exam with pelvic. Will have eye exam with her eye doctor. Having epidural in her back with Dr.Smith next Tues-2nd one.     Brandi Carpenter is a 52 y.o. female who presents for a complete physical.  She has the following concerns:  Postmenopausal symptoms.  She was started on HRT (estrogen patch and prometrium) in August. Prior to starting she had hot flashes, insomnia, some memory concerns, and irritability (as discussed at August visit, along with her pain complaints). She is "much better"--sleep is better (still needs ambien a few times/week, but stays asleep entire night), hot flashes are mostly gone, irritability is better.  "I feel better".   Noticed slight spotting twice (for 1-3 days duration, last was a few weeks ago).    Patient has been seeing Dr. Charlann Boxer for her back pain.  He reports she has multifactorial spinal stenosis as well as a right-sided S1 nerve impingement that is consistent with patient's radicular symptoms, with +SLR.  They discussed different treatment options including repeating formal physical therapy, possible referral for radiofrequency ablation, possible referral for surgical intervention or repeating the nerve root impingement injection. She is planning on repeat injection, scheduled for 12/17.  She had the first one 07/26/18. Benefit from the first shot lasted 1 day.  She takes neurontin, unsure how much it is helping for pain; denies side effects. Only takes 200mg  at bedtime.  This may also have been to treat her restless legs. This has been better. She uses Advil, 4-8/day for her back/leg pain.  Depression/Anxiety:  Moods are stable. Denies any side effectsfrom wellbutrin or citalopram. Denies feeling sad, hopeless, helpless.  Denies SI/HI. Moods are better, as is overall wellbeing, since being on HRT. Last refilled alprazolam #20 07/25/18, no refill is needed.  She used it more recently due to  MRI, travel to [place], root canal.  Allergies: Under the care of allergist, is on immunotherapy(now every other week). Doing well.  H/o headaches and migraines--she has had botox injections in 11/2015.Shethenalso had sinus surgery and neck surgery. Headaches had improved significantly after surgery. She later developed different headaches, with jaw clenching.  She was started back on nortryptilene, which she had to stop due to tinnitus. No longer having significant headaches (occasional mild sinus HA).  Immunization History  Administered Date(s) Administered  . Influenza Split 07/14/2014, 04/25/2016  . Influenza,inj,Quad PF,6+ Mos 06/28/2013, 08/01/2015  . Influenza-Unspecified 07/06/2017  . Tdap 05/11/2012  . Zoster 04/25/2016   Got her flu shot in October Last Pap smear:07/2016, normal, no high risk HPV was present. Last mammogram:04/2018 Last colonoscopy:09/2015 with Dr. Ardis Hughs, normal. Last DEXA: never  Dentist: twice a year Ophtho: 2 years ago Exercise:None. Had been doing Curves, but pain got worse after a couple of months.   Lipids: Lab Results  Component Value Date   CHOL 221 (H) 08/30/2017   HDL 69 08/30/2017   LDLCALC 127 (H) 08/30/2017   TRIG 134 08/30/2017   CHOLHDL 3.2 08/30/2017   Vitamin D-OH 67.5 in 04/2018  Past Medical History:  Diagnosis Date  . 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 Allergic rhinitis, cause unspecified   . Anxiety   . Complication of anesthesia    "difficulty awakening"  . Depression   . Headache   . Insomnia   . Vitamin D deficiency     Past Surgical History:  Procedure Laterality Date  .  ANKLE SURGERY  child   left for torn ligaments, left  . ANTERIOR CERVICAL DECOMP/DISCECTOMY FUSION N/A 01/27/2016   Procedure: C5-6, C6-7 Anterior Cervical Discectomy and Fusion, Allograft, Plate;  Surgeon: Marybelle Killings, MD;  Location: [facility];  Service: Orthopedics;  Laterality: N/A;  . CERVICAL  CONIZATION W/BX  2001  . NASAL SEPTOPLASTY W/ TURBINOPLASTY Bilateral 10/07/2015   Procedure: NASAL SEPTOPLASTY WITH BILATERAL TURBINATE REDUCTION;  Surgeon: Leta Baptist, MD;  Location: [facility];  Service: ENT;  Laterality: Bilateral;  . PILONIDAL CYST EXCISION      Social History   Socioeconomic History  . Marital status: Single    Spouse name: Not on file  . Number of children: 0  . Years of education: 58  . Highest education level: Not on file  Occupational History  . Occupation: Naval architect: [employer]  Social Needs  . Financial resource strain: Not on file  . Food insecurity:    Worry: Not on file    Inability: Not on file  . Transportation needs:    Medical: Not on file    Non-medical: Not on file  Tobacco Use  . Smoking status: Never Smoker  . Smokeless tobacco: Never Used  Substance and Sexual Activity  . Alcohol use: No    Alcohol/week: 0.0 standard drinks  . Drug use: No  . Sexual activity: Never    Partners: Female    Birth control/protection: Abstinence  Lifestyle  . Physical activity:    Days per week: Not on file    Minutes per session: Not on file  . Stress: Not on file  Relationships  . Social connections:    Talks on phone: Not on file    Gets together: Not on file    Attends religious service: Not on file    Active member of club or organization: Not on file    Attends meetings of clubs or organizations: Not on file    Relationship status: Not on file  . Intimate partner violence:    Fear of current or ex partner: Not on file    Emotionally abused: Not on file    Physically abused: Not on file    Forced sexual activity: Not on file  Other Topics Concern  . Not on file  Social History Narrative   Lives with female partner Juliann Pulse), 2 beagles    Family History  Problem Relation Age of Onset  . Hypertension Mother   . Diabetes Mother   . Heart disease Father   . Depression Sister   . Breast cancer Cousin    . Colon cancer Maternal Grandmother         died at 8  . Diabetes Maternal Grandmother   . Alzheimer's disease Paternal Grandmother   . Colon polyps Maternal Uncle   . Migraines Neg Hx     Outpatient Encounter Medications as of 08/25/2018  Medication Sig Note  . ALPRAZolam (XANAX) 0.5 MG tablet TAKE 1/2 TO 1 TABLET BY MOUTH THREE TIMES DAILY AS NEEDED FOR ANXIETY 08/25/2018: Taking more recently--dental surgery, flights, MRI; uses just prn  . buPROPion (WELLBUTRIN XL) 300 MG 24 hr tablet take 1 tablet by mouth every morning   . Cholecalciferol (VITAMIN D) 2000 UNITS tablet Take 2,000 Units by mouth daily.   . citalopram (CELEXA) 20 MG tablet take 1 tablet by mouth once daily   . estradiol (CLIMARA - DOSED IN MG/24 HR) 0.05 mg/24hr patch Place 1 patch (0.05  mg total) onto the skin once a week.   . fluticasone (FLONASE) 50 MCG/ACT nasal spray INSTILL 2 SPRAYS INTO EACH NOSTRIL twice a day   . gabapentin (NEURONTIN) 100 MG capsule Take 2 capsules (200 mg total) by mouth at bedtime.   Marland Kitchen guaiFENesin (MUCINEX) 600 MG 12 hr tablet Take 1,200 mg by mouth 2 (two) times daily.   Marland Kitchen levocetirizine (XYZAL) 5 MG tablet TAKE 1 TABLET BY MOUTH EVERY EVENING   . olopatadine (PATANOL) 0.1 % ophthalmic solution place 1 drop into both eyes twice a day   . progesterone (PROMETRIUM) 100 MG capsule Take 1 capsule (100 mg total) by mouth daily.   Marland Kitchen zolpidem (AMBIEN) 10 MG tablet TAKE 1/2 TO 1 TABLET(5 TO 10 MG) BY MOUTH AT BEDTIME AS NEEDED FOR SLEEP   . EPIPEN 2-PAK 0.3 MG/0.3ML SOAJ injection Reported on 02/17/2016   . [DISCONTINUED] OVER THE COUNTER MEDICATION Take 1 tablet by mouth 2 (two) times daily.  08/01/2015: Nature's Bounty AM/PM menopausal supplement  . [DISCONTINUED] predniSONE (DELTASONE) 50 MG tablet Take 1 tablet (50 mg total) by mouth daily.    No facility-administered encounter medications on file as of 08/25/2018.     Allergies  Allergen Reactions  . Codeine Itching and Nausea And Vomiting   . Pamelor [Nortriptyline] Tinitus    ROS: The patient denies anorexia, fever, vision changes, decreased hearing, ear pain, sore throat, breast concerns, chest pain, palpitations, dizziness, syncope, dyspnea on exertion, cough, swelling, nausea, vomiting, diarrhea, constipation, abdominal pain, melena, hematochezia, indigestion/heartburn, hematuria, incontinence, dysuria, vaginal discharge, odor or itch, genital lesions, weakness, tremor, suspicious skin lesions, abnormal bleeding/bruising, or enlarged lymph nodes.  Back pain per HPI, into the RLE. Some numbness into the right leg. Denies weakness Headaches --only occasional sinus headache Insomnia, per HPI, a few times/week, overall improved. Depression/anxiety improved, per HPI Hot flashes are significantly better Occasional mild spotting noted, see HPI   PHYSICAL EXAM:  BP 120/82   Pulse 76   Ht 5\' 4"  (1.626 m)   Wt 179 lb (81.2 kg)   LMP 01/06/2016   BMI 30.73 kg/m   Wt Readings from Last 3 Encounters:  08/25/18 179 lb (81.2 kg)  08/19/18 180 lb (81.6 kg)  07/25/18 187 lb (84.8 kg)    General Appearance:  Alert, cooperative, no distress, appears stated age.  Head:  Normocephalic, without obvious abnormality, atraumatic   Eyes:  PERRL, conjunctiva/corneas clear, EOM's intact, fundi benign   Ears:  Normal TM's and external ear canals. PE tube noted embedded in wax in the canal.  Nose:  Nares normal, mucosanormal--no significant edema noted. No sinus tenderness.   Throat:  Lips, mucosa, and tongue normal; teeth and gums normal   Neck:  Supple, no lymphadenopathy; thyroid: no enlargement/tenderness/nodules; no carotid bruit or JVD   Back:  Spine nontender, no curvature, ROM normal, no CVA tenderness   Lungs:  Clear to auscultation bilaterally without wheezes, rales or ronchi; respirations unlabored   Chest Wall:  No tenderness or deformity   Heart:  Regular rate and rhythm, S1 and S2  normal, no murmur, rub or gallop   Breast Exam:  No tenderness, masses, or nipple discharge or inversion. No axillary lymphadenopathy.  Abdomen:  Soft, non-tender, nondistended, normoactive bowel sounds, no masses, no hepatosplenomegaly   Genitalia:  Normal external genitalia without lesions. BUS and vagina normal;no cervical motion tenderness. No abnormal vaginal discharge. Uterus and adnexa not enlarged, nontender, no masses. Pap not performed.   Rectal:  Normal tone, no  masses or tenderness; Small rectocele noted. Stool is heme negative  Extremities:  No clubbing, cyanosis or edema   Pulses:  2+ and symmetric all extremities   Skin:  Skin color, texture, turgor normal, no rashes.  Lymph nodes:  Cervical, supraclavicular, and axillary nodes normal   Neurologic:  CNII-XII intact, normal strength, sensation and gait; reflexes 2+ and symmetric throughout    Psych: Normal mood, affect, hygiene and grooming   ASSESSMENT/PLAN:  Annual physical exam - Plan: POCT Urinalysis DIP (Proadvantage Device), Comprehensive metabolic panel  Postmenopausal symptoms - significantly improved. Cont Climara and prometrium. Refer to GYN if any ongoing spotting  Depression, major, in remission ([place])  Insomnia, unspecified type - significantly improved; continue ambien prn  Lumbar radiculopathy - plans for cortisone injection next week. Reviewed other possible treatment options, if needed  Medication monitoring encounter - Plan: Comprehensive metabolic panel  Need for shingles vaccine - risks/SE reviewed.  She is willing to be (declined checking insurance). return in 2 mos for 2nd vaccine - Plan: Varicella-zoster vaccine IM (Shingrix)  Restless legs - improved with 200mg  gabapentin qHS  Pt understands the potential causes of spotting/bleeding (imbalance of hormones, but cannot r/o endometrial CA, especially in someone who was never pregnant), and the need for GYN  eval if persists/recurs.  Discussed monthly self breast exams and yearly mammograms; at least 30 minutes of aerobic activity at least 5 days/week, weight-bearing exercise at least 2x/wk; proper sunscreen use reviewed; healthy diet, including goals of calcium and vitamin D intake and alcohol recommendations (less than or equal to 1 drink/day) reviewed; regular seatbelt use; changing batteries in smoke detectors. Immunization recommendations discussed--continue yearly flu shots. Shingrix given. F/u for 2nd shot in 2 mos. Colonoscopy recommendations reviewed--UTD   F/u 1 year, sooner prn

## 2018-08-25 ENCOUNTER — Other Ambulatory Visit: Payer: Self-pay | Admitting: Family Medicine

## 2018-08-25 ENCOUNTER — Ambulatory Visit: Payer: BC Managed Care – PPO | Admitting: Family Medicine

## 2018-08-25 ENCOUNTER — Encounter: Payer: Self-pay | Admitting: Family Medicine

## 2018-08-25 VITALS — BP 120/82 | HR 76 | Ht 64.0 in | Wt 179.0 lb

## 2018-08-25 DIAGNOSIS — N959 Unspecified menopausal and perimenopausal disorder: Secondary | ICD-10-CM | POA: Diagnosis not present

## 2018-08-25 DIAGNOSIS — G2581 Restless legs syndrome: Secondary | ICD-10-CM

## 2018-08-25 DIAGNOSIS — F325 Major depressive disorder, single episode, in full remission: Secondary | ICD-10-CM | POA: Diagnosis not present

## 2018-08-25 DIAGNOSIS — M5416 Radiculopathy, lumbar region: Secondary | ICD-10-CM

## 2018-08-25 DIAGNOSIS — Z23 Encounter for immunization: Secondary | ICD-10-CM | POA: Diagnosis not present

## 2018-08-25 DIAGNOSIS — G4700 Insomnia, unspecified: Secondary | ICD-10-CM

## 2018-08-25 DIAGNOSIS — Z0000 Encounter for general adult medical examination without abnormal findings: Secondary | ICD-10-CM | POA: Diagnosis not present

## 2018-08-25 DIAGNOSIS — Z5181 Encounter for therapeutic drug level monitoring: Secondary | ICD-10-CM

## 2018-08-25 LAB — COMPREHENSIVE METABOLIC PANEL
ALBUMIN: 4.7 g/dL (ref 3.5–5.5)
ALT: 18 IU/L (ref 0–32)
AST: 15 IU/L (ref 0–40)
Albumin/Globulin Ratio: 2.2 (ref 1.2–2.2)
Alkaline Phosphatase: 74 IU/L (ref 39–117)
BUN/Creatinine Ratio: 15 (ref 9–23)
BUN: 14 mg/dL (ref 6–24)
Bilirubin Total: 1.1 mg/dL (ref 0.0–1.2)
CALCIUM: 9.5 mg/dL (ref 8.7–10.2)
CO2: 25 mmol/L (ref 20–29)
Chloride: 98 mmol/L (ref 96–106)
Creatinine, Ser: 0.91 mg/dL (ref 0.57–1.00)
GFR calc Af Amer: 83 mL/min/{1.73_m2} (ref >59)
GFR calc non Af Amer: 72 mL/min/{1.73_m2} (ref >59)
GLOBULIN, TOTAL: 2.1 g/dL (ref 1.5–4.5)
Glucose: 79 mg/dL (ref 65–99)
Potassium: 4.3 mmol/L (ref 3.5–5.2)
Sodium: 136 mmol/L (ref 134–144)
Total Protein: 6.8 g/dL (ref 6.0–8.5)

## 2018-08-25 LAB — POCT URINALYSIS DIP (PROADVANTAGE DEVICE)
BILIRUBIN UA: NEGATIVE mg/dL
Bilirubin, UA: NEGATIVE
Blood, UA: NEGATIVE
GLUCOSE UA: NEGATIVE mg/dL
LEUKOCYTES UA: NEGATIVE
Nitrite, UA: NEGATIVE
PH UA: 6 (ref 5.0–8.0)
PROTEIN UA: NEGATIVE mg/dL
Specific Gravity, Urine: 1.02
Urobilinogen, Ur: NEGATIVE

## 2018-08-25 NOTE — Patient Instructions (Addendum)
  HEALTH MAINTENANCE RECOMMENDATIONS:  It is recommended that you get at least 30 minutes of aerobic exercise at least 5 days/week (for weight loss, you may need as much as 60-90 minutes). This can be any activity that gets your heart rate up. This can be divided in 10-15 minute intervals if needed, but try and build up your endurance at least once a week.  Weight bearing exercise is also recommended twice weekly.  Eat a healthy diet with lots of vegetables, fruits and fiber.  "Colorful" foods have a lot of vitamins (ie green vegetables, tomatoes, red peppers, etc).  Limit sweet tea, regular sodas and alcoholic beverages, all of which has a lot of calories and sugar.  Up to 1 alcoholic drink daily may be beneficial for women (unless trying to lose weight, watch sugars).  Drink a lot of water.  Calcium recommendations are 1200-1500 mg daily (1500 mg for postmenopausal women or women without ovaries), and vitamin D 1000 IU daily.  This should be obtained from diet and/or supplements (vitamins), and calcium should not be taken all at once, but in divided doses.  Monthly self breast exams and yearly mammograms for women over the age of 48 is recommended.  Sunscreen of at least SPF 30 should be used on all sun-exposed parts of the skin when outside between the hours of 10 am and 4 pm (not just when at beach or pool, but even with exercise, golf, tennis, and yard work!)  Use a sunscreen that says "broad spectrum" so it covers both UVA and UVB rays, and make sure to reapply every 1-2 hours.  Remember to change the batteries in your smoke detectors when changing your clock times in the spring and fall.  Use your seat belt every time you are in a car, and please drive safely and not be distracted with cell phones and texting while driving.  Please be sure to let us know if you have any ongoing issues with vaginal spotting or bleeding, as we will send you to a gynecologist.  Routine eye exam is  recommended.,

## 2018-08-30 ENCOUNTER — Ambulatory Visit
Admission: RE | Admit: 2018-08-30 | Discharge: 2018-08-30 | Disposition: A | Discharge status: Home / Self Care | Payer: BC Managed Care – PPO | Source: Ambulatory Visit | Attending: Family Medicine | Admitting: Family Medicine

## 2018-08-30 DIAGNOSIS — M5416 Radiculopathy, lumbar region: Secondary | ICD-10-CM

## 2018-08-30 MED ORDER — METHYLPREDNISOLONE ACETATE 40 MG/ML INJ SUSP (RADIOLOG
120.0000 mg | Freq: Once | INTRAMUSCULAR | Status: AC
  Administered 2018-08-30: 120 mg via EPIDURAL

## 2018-08-30 MED ORDER — IOPAMIDOL (ISOVUE-M 200) INJECTION 41%
1.0000 mL | Freq: Once | INTRAMUSCULAR | Status: AC
  Administered 2018-08-30: 1 mL via EPIDURAL

## 2018-09-16 ENCOUNTER — Other Ambulatory Visit: Payer: Self-pay | Admitting: Family Medicine

## 2018-09-16 DIAGNOSIS — G4700 Insomnia, unspecified: Secondary | ICD-10-CM

## 2018-09-18 ENCOUNTER — Other Ambulatory Visit: Payer: Self-pay | Admitting: Family Medicine

## 2018-09-18 DIAGNOSIS — J309 Allergic rhinitis, unspecified: Secondary | ICD-10-CM

## 2018-09-19 NOTE — Telephone Encounter (Signed)
Is this okay to refill? 

## 2018-09-21 NOTE — Progress Notes (Signed)
[name] [address] [place], [place] [address] Phone: [phone] Subjective:   [name], am serving as a scribe for Dr. Hulan Saas.  I'm seeing this patient by the request  of:    CC: Back pain and elbow pain follow-up  VVO:[id number]  Brandi Carpenter is a 53 y.o. female coming in with complaint of back pain. Did have relief from 2nd epidural. Has tried to begin walking and when taking down the tree and did develop a soreness in her back with some radicular symptoms in her legs.  Found to have spinal stenosis.  Had an epidural.  Feeling of 85% better.  Patient would like more guidance on what to do next.  Wants to start increasing activity but does not want the pain to come back.  Is wearing her wrist splint again during the day. Having an increase in elbow pain. Pain increases when pick up items overhand and mousing at work. Denies any radiating symptoms.  Found to have more of a lateral epicondylitis.  Does not feel like the conservative therapy is helping     Past Medical History:  Diagnosis Date  . 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 Allergic rhinitis, cause unspecified   . Anxiety   . Complication of anesthesia    "difficulty awakening"  . Depression   . Headache   . Insomnia   . Vitamin D deficiency    Past Surgical History:  Procedure Laterality Date  . ANKLE SURGERY  child   left for torn ligaments, left  . ANTERIOR CERVICAL DECOMP/DISCECTOMY FUSION N/A 01/27/2016   Procedure: C5-6, C6-7 Anterior Cervical Discectomy and Fusion, Allograft, Plate;  Surgeon: Marybelle Killings, MD;  Location: [facility];  Service: Orthopedics;  Laterality: N/A;  . CERVICAL CONIZATION W/BX  2001  . NASAL SEPTOPLASTY W/ TURBINOPLASTY Bilateral 10/07/2015   Procedure: NASAL SEPTOPLASTY WITH BILATERAL TURBINATE REDUCTION;  Surgeon: Leta Baptist, MD;  Location: [facility];  Service: ENT;  Laterality: Bilateral;  .  PILONIDAL CYST EXCISION     Social History   Socioeconomic History  . Marital status: Single    Spouse name: Not on file  . Number of children: 0  . Years of education: 31  . Highest education level: Not on file  Occupational History  . Occupation: Naval architect: [employer]  Social Needs  . Financial resource strain: Not on file  . Food insecurity:    Worry: Not on file    Inability: Not on file  . Transportation needs:    Medical: Not on file    Non-medical: Not on file  Tobacco Use  . Smoking status: Never Smoker  . Smokeless tobacco: Never Used  Substance and Sexual Activity  . Alcohol use: No    Alcohol/week: 0.0 standard drinks  . Drug use: No  . Sexual activity: Never    Partners: Female    Birth control/protection: Abstinence  Lifestyle  . Physical activity:    Days per week: Not on file    Minutes per session: Not on file  . Stress: Not on file  Relationships  . Social connections:    Talks on phone: Not on file    Gets together: Not on file    Attends religious service: Not on file    Active member of club or organization: Not on file    Attends meetings of clubs or organizations:  Not on file    Relationship status: Not on file  Other Topics Concern  . Not on file  Social History Narrative   Lives with female partner Juliann Pulse), 2 beagles   Allergies  Allergen Reactions  . Codeine Itching and Nausea And Vomiting  . Pamelor [Nortriptyline] Tinitus   Family History  Problem Relation Age of Onset  . Hypertension Mother   . Diabetes Mother   . Heart disease Father   . Depression Sister   . Breast cancer Cousin   . Colon cancer Maternal Grandmother         died at 39  . Diabetes Maternal Grandmother   . Alzheimer's disease Paternal Grandmother   . Colon polyps Maternal Uncle   . Migraines Neg Hx     Current Outpatient Medications (Endocrine & Metabolic):  .  estradiol (CLIMARA - DOSED IN MG/24 HR) 0.05 mg/24hr patch, Place 1  patch (0.05 mg total) onto the skin once a week. .  progesterone (PROMETRIUM) 100 MG capsule, Take 1 capsule (100 mg total) by mouth daily.  Current Outpatient Medications (Cardiovascular):  Marland Kitchen  EPIPEN 2-PAK 0.3 MG/0.3ML SOAJ injection, Reported on 02/17/2016  Current Outpatient Medications (Respiratory):  .  fluticasone (FLONASE) 50 MCG/ACT nasal spray, INSTILL 2 SPRAYS INTO EACH NOSTRIL twice a day .  guaiFENesin (MUCINEX) 600 MG 12 hr tablet, Take 1,200 mg by mouth 2 (two) times daily. Marland Kitchen  levocetirizine (XYZAL) 5 MG tablet, TAKE 1 TABLET BY MOUTH EVERY EVENING    Current Outpatient Medications (Other):  Marland Kitchen  ALPRAZolam (XANAX) 0.5 MG tablet, TAKE 1/2 TO 1 TABLET BY MOUTH THREE TIMES DAILY AS NEEDED FOR ANXIETY .  buPROPion (WELLBUTRIN XL) 300 MG 24 hr tablet, take 1 tablet by mouth every morning .  Cholecalciferol (VITAMIN D) 2000 UNITS tablet, Take 2,000 Units by mouth daily. .  citalopram (CELEXA) 20 MG tablet, take 1 tablet by mouth once daily .  gabapentin (NEURONTIN) 100 MG capsule, Take 2 capsules (200 mg total) by mouth at bedtime. Marland Kitchen  olopatadine (PATANOL) 0.1 % ophthalmic solution, place 1 drop into both eyes twice a day .  zolpidem (AMBIEN) 10 MG tablet, TAKE 1/2 TO 1 TABLET(5 TO 10 MG) BY MOUTH AT BEDTIME AS NEEDED FOR SLEEP    Past medical history, social, surgical and family history all reviewed in electronic medical record.  No pertanent information unless stated regarding to the chief complaint.   Review of Systems:  No headache, visual changes, nausea, vomiting, diarrhea, constipation, dizziness, abdominal pain, skin rash, fevers, chills, night sweats, weight loss, swollen lymph nodes, body aches, joint swelling, muscle aches, chest pain, shortness of breath, mood changes.   Objective  Blood pressure 122/88, height 5\' 4"  (1.626 m), weight 179 lb (81.2 kg), last menstrual period 01/06/2016.    General: No apparent distress alert and oriented x3 mood and affect normal,  dressed appropriately.  HEENT: Pupils equal, extraocular movements intact  Respiratory: Patient's speak in full sentences and does not appear short of breath  Cardiovascular: No lower extremity edema, non tender, no erythema  Skin: Warm dry intact with no signs of infection or rash on extremities or on axial skeleton.  Abdomen: Soft nontender  Neuro: Cranial nerves II through XII are intact, neurovascularly intact in all extremities with 2+ DTRs and 2+ pulses.  Lymph: No lymphadenopathy of posterior or anterior cervical chain or axillae bilaterally.  Gait normal with good balance and coordination.  MSK:  Non tender with full range of motion and good  stability and symmetric strength and tone of shoulders,  wrist, hip, knee and ankles bilaterally.  Right elbow pain seems to be worsening at this point.  More tender to palpation over the lateral epicondylar region.  Does have some mild weakness with wrist extension against resistance with 4 out of 5 strength compared to the contralateral side.  Patient does have some neck issues and mild positive for radiculopathy but not as bursitis is the tenderness over the lateral epicondylar region  Back exam shows the patient does have tenderness to palpation still noted around the sacroiliac joints bilaterally.  Negative leg straight leg test but patient does have some tightness of the hamstrings noted.  5 out of 5 strength and neurovascularly intact distally.  Tightness of the [facility] test bilaterally   Impression and Recommendations:     This case required medical decision making of moderate complexity. The above documentation has been reviewed and is accurate and complete Lyndal Pulley, DO       Note: This dictation was prepared with Dragon dictation along with smaller phrase technology. Any transcriptional errors that result from this process are unintentional.

## 2018-09-22 ENCOUNTER — Ambulatory Visit: Payer: BC Managed Care – PPO | Admitting: Family Medicine

## 2018-09-22 VITALS — BP 122/88 | Ht 64.0 in | Wt 179.0 lb

## 2018-09-22 DIAGNOSIS — M7711 Lateral epicondylitis, right elbow: Secondary | ICD-10-CM | POA: Diagnosis not present

## 2018-09-22 DIAGNOSIS — M5416 Radiculopathy, lumbar region: Secondary | ICD-10-CM

## 2018-09-22 DIAGNOSIS — M545 Low back pain: Secondary | ICD-10-CM | POA: Diagnosis not present

## 2018-09-22 DIAGNOSIS — G8929 Other chronic pain: Secondary | ICD-10-CM | POA: Diagnosis not present

## 2018-09-22 NOTE — Assessment & Plan Note (Signed)
No significant improvement with conservative therapy.  Discussed the possibility of corticosteroid injection versus the possibility of PRP.  Patient would like to go forward with the PRP.  Patient will have this set up and we will do it.  Patient is under aware that this is a 6-week return to progression.  Patient is in agreement with the plan.  We will see patient soon for this.

## 2018-09-22 NOTE — Patient Instructions (Signed)
Good to see you  PT will be calling you on the back  We will see you soon for the PRP  Happy New Year!

## 2018-09-22 NOTE — Assessment & Plan Note (Signed)
Multifactorial spinal stenosis from L3-L5.  Patient has responded better to an epidural this time with 85% decrease in pain.  Restart formal physical therapy that I think will be more beneficial including the possibility of dry needling.  As long as patient does well we will continue conservative therapy.  Follow-up with me again in 6 weeks

## 2018-09-23 ENCOUNTER — Encounter: Payer: Self-pay | Admitting: Family Medicine

## 2018-10-04 NOTE — Progress Notes (Signed)
[name] [address] [place], [place] [address] Phone: [phone] Subjective:   [name], am serving as a scribe for Dr. Hulan Saas.   CC: Right arm pain follow-up  [id number]  Brandi Carpenter is a 53 y.o. female coming in with complaint of lateral epicondylitis of the right elbow. Patient has had chronic pain and is here for PRP injection today.  Failed conservative therapy otherwise.       Past Medical History:  Diagnosis Date  . 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 Allergic rhinitis, cause unspecified   . Anxiety   . Complication of anesthesia    "difficulty awakening"  . Depression   . Headache   . Insomnia   . Vitamin D deficiency    Past Surgical History:  Procedure Laterality Date  . ANKLE SURGERY  child   left for torn ligaments, left  . ANTERIOR CERVICAL DECOMP/DISCECTOMY FUSION N/A 01/27/2016   Procedure: C5-6, C6-7 Anterior Cervical Discectomy and Fusion, Allograft, Plate;  Surgeon: Marybelle Killings, MD;  Location: [facility];  Service: Orthopedics;  Laterality: N/A;  . CERVICAL CONIZATION W/BX  2001  . NASAL SEPTOPLASTY W/ TURBINOPLASTY Bilateral 10/07/2015   Procedure: NASAL SEPTOPLASTY WITH BILATERAL TURBINATE REDUCTION;  Surgeon: Leta Baptist, MD;  Location: [facility];  Service: ENT;  Laterality: Bilateral;  . PILONIDAL CYST EXCISION     Social History   Socioeconomic History  . Marital status: Single    Spouse name: Not on file  . Number of children: 0  . Years of education: 58  . Highest education level: Not on file  Occupational History  . Occupation: Naval architect: [employer]  Social Needs  . Financial resource strain: Not on file  . Food insecurity:    Worry: Not on file    Inability: Not on file  . Transportation needs:    Medical: Not on file    Non-medical: Not on file  Tobacco Use  . Smoking status: Never Smoker  . Smokeless tobacco:  Never Used  Substance and Sexual Activity  . Alcohol use: No    Alcohol/week: 0.0 standard drinks  . Drug use: No  . Sexual activity: Never    Partners: Female    Birth control/protection: Abstinence  Lifestyle  . Physical activity:    Days per week: Not on file    Minutes per session: Not on file  . Stress: Not on file  Relationships  . Social connections:    Talks on phone: Not on file    Gets together: Not on file    Attends religious service: Not on file    Active member of club or organization: Not on file    Attends meetings of clubs or organizations: Not on file    Relationship status: Not on file  Other Topics Concern  . Not on file  Social History Narrative   Lives with female partner Juliann Pulse), 2 beagles   Allergies  Allergen Reactions  . Codeine Itching and Nausea And Vomiting  . Pamelor [Nortriptyline] Tinitus   Family History  Problem Relation Age of Onset  . Hypertension Mother   . Diabetes Mother   . Heart disease Father   . Depression Sister   . Breast cancer Cousin   . Colon cancer Maternal Grandmother         died at 75  . Diabetes Maternal Grandmother   .  Alzheimer's disease Paternal Grandmother   . Colon polyps Maternal Uncle   . Migraines Neg Hx     Current Outpatient Medications (Endocrine & Metabolic):  .  estradiol (CLIMARA - DOSED IN MG/24 HR) 0.05 mg/24hr patch, Place 1 patch (0.05 mg total) onto the skin once a week. .  progesterone (PROMETRIUM) 100 MG capsule, Take 1 capsule (100 mg total) by mouth daily.  Current Outpatient Medications (Cardiovascular):  Marland Kitchen  EPIPEN 2-PAK 0.3 MG/0.3ML SOAJ injection, Reported on 02/17/2016  Current Outpatient Medications (Respiratory):  .  fluticasone (FLONASE) 50 MCG/ACT nasal spray, INSTILL 2 SPRAYS INTO EACH NOSTRIL twice a day .  guaiFENesin (MUCINEX) 600 MG 12 hr tablet, Take 1,200 mg by mouth 2 (two) times daily. Marland Kitchen  levocetirizine (XYZAL) 5 MG tablet, TAKE 1 TABLET BY MOUTH EVERY  EVENING    Current Outpatient Medications (Other):  Marland Kitchen  ALPRAZolam (XANAX) 0.5 MG tablet, TAKE 1/2 TO 1 TABLET BY MOUTH THREE TIMES DAILY AS NEEDED FOR ANXIETY .  buPROPion (WELLBUTRIN XL) 300 MG 24 hr tablet, take 1 tablet by mouth every morning .  Cholecalciferol (VITAMIN D) 2000 UNITS tablet, Take 2,000 Units by mouth daily. .  citalopram (CELEXA) 20 MG tablet, take 1 tablet by mouth once daily .  gabapentin (NEURONTIN) 100 MG capsule, Take 2 capsules (200 mg total) by mouth at bedtime. Marland Kitchen  olopatadine (PATANOL) 0.1 % ophthalmic solution, place 1 drop into both eyes twice a day .  zolpidem (AMBIEN) 10 MG tablet, TAKE 1/2 TO 1 TABLET(5 TO 10 MG) BY MOUTH AT BEDTIME AS NEEDED FOR SLEEP    Past medical history, social, surgical and family history all reviewed in electronic medical record.  No pertanent information unless stated regarding to the chief complaint.   Review of Systems:  No headache, visual changes, nausea, vomiting, diarrhea, constipation, dizziness, abdominal pain, skin rash, fevers, chills, night sweats, weight loss, swollen lymph nodes, body aches, joint swelling, muscle aches, chest pain, shortness of breath, mood changes.   Objective  Height 5\' 4"  (1.626 m), last menstrual period 01/06/2016. Systems examined below as of    General: No apparent distress alert and oriented x3 mood and affect normal, dressed appropriately.  HEENT: Pupils equal, extraocular movements intact  Respiratory: Patient's speak in full sentences and does not appear short of breath   Procedure: Real-time Ultrasound Guided Injection of right lateral epicondylar region Device: GE Logiq Q7 Ultrasound guided injection is preferred based studies that show increased duration, increased effect, greater accuracy, decreased procedural pain, increased response rate, and decreased cost with ultrasound guided versus blind injection.  Verbal informed consent obtained.  Time-out conducted.  Noted no overlying  erythema, induration, or other signs of local infection.  Skin prepped in a sterile fashion.  Local anesthesia: Topical Ethyl chloride.  With sterile technique and under real time ultrasound guidance: With a 21-gauge 2 inch needle patient was injected with 0.5 cc of 0.5% Marcaine into the lateral epicondylar region.  The common extensor tendon sheath in the area of tearing.  Patient then had pre-centrifuge PRP injected with a total of 3.5 cc into the area. Completed without difficulty  Pain immediately resolved suggesting accurate placement of the medication.  Advised to call if fevers/chills, erythema, induration, drainage, or persistent bleeding.  Images permanently stored and available for review in the ultrasound unit.  Impression: Technically successful ultrasound guided injection.   Impression and Recommendations:     This case required medical decision making of moderate complexity. The above documentation has  been reviewed and is accurate and complete Lyndal Pulley, DO       Note: This dictation was prepared with Dragon dictation along with smaller phrase technology. Any transcriptional errors that result from this process are unintentional.

## 2018-10-05 ENCOUNTER — Ambulatory Visit: Payer: Self-pay

## 2018-10-05 ENCOUNTER — Ambulatory Visit: Payer: BC Managed Care – PPO | Admitting: Family Medicine

## 2018-10-05 VITALS — Ht 64.0 in

## 2018-10-05 DIAGNOSIS — M7711 Lateral epicondylitis, right elbow: Secondary | ICD-10-CM

## 2018-10-05 DIAGNOSIS — M25521 Pain in right elbow: Secondary | ICD-10-CM

## 2018-10-05 NOTE — Assessment & Plan Note (Addendum)
Patient tolerated the procedure well.  Discussed icing regimen and home exercise.  Discussed which activities to do which was to avoid.  Patient is to increase activity slowly over the course the next several days.  Post PRP instructions given.  Patient will follow-up in 3 weeks

## 2018-10-05 NOTE — Patient Instructions (Signed)
Good to see you  No ice and no ibuprofen for 3 days Wear the wrist brace day and night for next week.  Follow the guidelines See me again in 3 weeks

## 2018-10-11 ENCOUNTER — Telehealth: Payer: Self-pay | Admitting: Family Medicine

## 2018-10-11 DIAGNOSIS — J309 Allergic rhinitis, unspecified: Secondary | ICD-10-CM

## 2018-10-11 MED ORDER — OLOPATADINE HCL 0.1 % OP SOLN: OPHTHALMIC | 5 refills | Status: DC

## 2018-10-11 NOTE — Telephone Encounter (Signed)
[facility] req Olopatadine Opth Drops

## 2018-10-15 HISTORY — PX: LUMBAR DISC SURGERY: SHX700

## 2018-10-17 ENCOUNTER — Other Ambulatory Visit: Payer: Self-pay | Admitting: Family Medicine

## 2018-10-17 DIAGNOSIS — N959 Unspecified menopausal and perimenopausal disorder: Secondary | ICD-10-CM

## 2018-10-17 NOTE — Telephone Encounter (Signed)
Is this okay to refill? 

## 2018-10-18 ENCOUNTER — Other Ambulatory Visit: Payer: Self-pay | Admitting: Family Medicine

## 2018-10-18 DIAGNOSIS — N959 Unspecified menopausal and perimenopausal disorder: Secondary | ICD-10-CM

## 2018-10-18 DIAGNOSIS — F325 Major depressive disorder, single episode, in full remission: Secondary | ICD-10-CM

## 2018-10-18 NOTE — Telephone Encounter (Signed)
Is this okay to refill? 

## 2018-10-26 ENCOUNTER — Other Ambulatory Visit (INDEPENDENT_AMBULATORY_CARE_PROVIDER_SITE_OTHER): Payer: BC Managed Care – PPO

## 2018-10-26 DIAGNOSIS — Z23 Encounter for immunization: Secondary | ICD-10-CM | POA: Diagnosis not present

## 2018-10-27 ENCOUNTER — Ambulatory Visit: Payer: BC Managed Care – PPO | Admitting: Family Medicine

## 2018-10-27 ENCOUNTER — Ambulatory Visit: Payer: Self-pay

## 2018-10-27 ENCOUNTER — Encounter: Payer: Self-pay | Admitting: Family Medicine

## 2018-10-27 VITALS — BP 120/74 | HR 76 | Ht 64.0 in | Wt 183.0 lb

## 2018-10-27 DIAGNOSIS — M5416 Radiculopathy, lumbar region: Secondary | ICD-10-CM

## 2018-10-27 DIAGNOSIS — M25521 Pain in right elbow: Secondary | ICD-10-CM

## 2018-10-27 DIAGNOSIS — M7711 Lateral epicondylitis, right elbow: Secondary | ICD-10-CM | POA: Diagnosis not present

## 2018-10-27 MED ORDER — KETOROLAC TROMETHAMINE 60 MG/2ML IM SOLN
60.0000 mg | Freq: Once | INTRAMUSCULAR | Status: AC
  Administered 2018-10-27: 60 mg via INTRAMUSCULAR

## 2018-10-27 MED ORDER — PREDNISONE 50 MG PO TABS: 50.0000 mg | ORAL_TABLET | Freq: Every day | ORAL | 0 refills | Status: DC

## 2018-10-27 MED ORDER — METHYLPREDNISOLONE ACETATE 80 MG/ML IJ SUSP
80.0000 mg | Freq: Once | INTRAMUSCULAR | Status: AC
  Administered 2018-10-27: 80 mg via INTRAMUSCULAR

## 2018-10-27 NOTE — Patient Instructions (Addendum)
Good to see you  Ice Is your friend 2 injections today  The elbow looks great wear the brace less and less overall.  Tried manipulation on the back and I hope it helps Increase gabapentin to 300mg  at night sorry out of samples.  See me again in 3 weeks

## 2018-10-27 NOTE — Assessment & Plan Note (Signed)
Healing noted on ultrasound.  Discussed which activities to do which wants to avoid.  Patient is making improvement noted.  Patient will follow-up with the increase in activities that was given on the sheet.  Follow-up again in 3 weeks

## 2018-10-27 NOTE — Assessment & Plan Note (Signed)
Worsening at this point.  Does have spinal stenosis.  Repeat epidural.  If continued pain will need to send to neurosurgery for further evaluation and treatment.  Short course of prednisone also given.  Injections given today to help with pain relief.

## 2018-10-27 NOTE — Progress Notes (Signed)
[name] [address] [place],  [place] [address] Phone: [phone] Subjective:    I Brandi Carpenter am serving as a Education administrator for Dr. Hulan Saas.     CC: Elbow pain follow-up  [id number]    10/05/2018  Patient tolerated the procedure well.  Discussed icing regimen and home exercise.  Discussed which activities to do which was to avoid.  Patient is to increase activity slowly over the course the next several days.  Post PRP instructions given.  Patient will follow-up in 3 weeks.  Updated 10/27/2018  Brandi Carpenter is a 53 y.o. female coming in with complaint of right elbow pain follow-up.  Seen 3 weeks ago and had PRP for lateral epicondylitis.  Patient states that she can't really tell if it is doing better. Patient has not been doing anything.  She continues to wear the wrist brace.  She also has a known spinal stenosis.  Worsening pain at the moment.  States that is affecting daily activities.  Has called out of work.  Has responded intermittently to epidurals but last one was not       Past Medical History:  Diagnosis Date  . 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 Allergic rhinitis, cause unspecified   . Anxiety   . Complication of anesthesia    "difficulty awakening"  . Depression   . Headache   . Insomnia   . Vitamin D deficiency    Past Surgical History:  Procedure Laterality Date  . ANKLE SURGERY  child   left for torn ligaments, left  . ANTERIOR CERVICAL DECOMP/DISCECTOMY FUSION N/A 01/27/2016   Procedure: C5-6, C6-7 Anterior Cervical Discectomy and Fusion, Allograft, Plate;  Surgeon: Marybelle Killings, MD;  Location: [facility];  Service: Orthopedics;  Laterality: N/A;  . CERVICAL CONIZATION W/BX  2001  . NASAL SEPTOPLASTY W/ TURBINOPLASTY Bilateral 10/07/2015   Procedure: NASAL SEPTOPLASTY WITH BILATERAL TURBINATE REDUCTION;  Surgeon: Leta Baptist, MD;  Location: [facility];  Service: ENT;   Laterality: Bilateral;  . PILONIDAL CYST EXCISION     Social History   Socioeconomic History  . Marital status: Single    Spouse name: Not on file  . Number of children: 0  . Years of education: 19  . Highest education level: Not on file  Occupational History  . Occupation: Naval architect: [employer]  Social Needs  . Financial resource strain: Not on file  . Food insecurity:    Worry: Not on file    Inability: Not on file  . Transportation needs:    Medical: Not on file    Non-medical: Not on file  Tobacco Use  . Smoking status: Never Smoker  . Smokeless tobacco: Never Used  Substance and Sexual Activity  . Alcohol use: No    Alcohol/week: 0.0 standard drinks  . Drug use: No  . Sexual activity: Never    Partners: Female    Birth control/protection: Abstinence  Lifestyle  . Physical activity:    Days per week: Not on file    Minutes per session: Not on file  . Stress: Not on file  Relationships  . Social connections:    Talks on phone: Not on file    Gets together: Not on file    Attends religious service: Not on file    Active member of club or organization: Not on file    Attends meetings of clubs  or organizations: Not on file    Relationship status: Not on file  Other Topics Concern  . Not on file  Social History Narrative   Lives with female partner Juliann Pulse), 2 beagles   Allergies  Allergen Reactions  . Codeine Itching and Nausea And Vomiting  . Pamelor [Nortriptyline] Tinitus   Family History  Problem Relation Age of Onset  . Hypertension Mother   . Diabetes Mother   . Heart disease Father   . Depression Sister   . Breast cancer Cousin   . Colon cancer Maternal Grandmother         died at 80  . Diabetes Maternal Grandmother   . Alzheimer's disease Paternal Grandmother   . Colon polyps Maternal Uncle   . Migraines Neg Hx     Current Outpatient Medications (Endocrine & Metabolic):  .  estradiol (CLIMARA - DOSED IN MG/24 HR) 0.05  mg/24hr patch, APPLY 1 PATCH ONTO THE SKIN ONCE A WEEK .  progesterone (PROMETRIUM) 100 MG capsule, TAKE 1 CAPSULE(100 MG) BY MOUTH DAILY .  predniSONE (DELTASONE) 50 MG tablet, Take 1 tablet (50 mg total) by mouth daily.  Current Outpatient Medications (Cardiovascular):  Marland Kitchen  EPIPEN 2-PAK 0.3 MG/0.3ML SOAJ injection, Reported on 02/17/2016  Current Outpatient Medications (Respiratory):  .  fluticasone (FLONASE) 50 MCG/ACT nasal spray, INSTILL 2 SPRAYS INTO EACH NOSTRIL twice a day .  guaiFENesin (MUCINEX) 600 MG 12 hr tablet, Take 1,200 mg by mouth 2 (two) times daily. Marland Kitchen  levocetirizine (XYZAL) 5 MG tablet, TAKE 1 TABLET BY MOUTH EVERY EVENING    Current Outpatient Medications (Other):  Marland Kitchen  ALPRAZolam (XANAX) 0.5 MG tablet, TAKE 1/2 TO 1 TABLET BY MOUTH THREE TIMES DAILY AS NEEDED FOR ANXIETY .  buPROPion (WELLBUTRIN XL) 300 MG 24 hr tablet, TAKE 1 TABLET BY MOUTH EVERY MORNING .  Cholecalciferol (VITAMIN D) 2000 UNITS tablet, Take 2,000 Units by mouth daily. .  citalopram (CELEXA) 20 MG tablet, TAKE 1 TABLET BY MOUTH ONCE DAILY .  gabapentin (NEURONTIN) 100 MG capsule, Take 2 capsules (200 mg total) by mouth at bedtime. Marland Kitchen  olopatadine (PATANOL) 0.1 % ophthalmic solution, place 1 drop into both eyes twice a day .  zolpidem (AMBIEN) 10 MG tablet, TAKE 1/2 TO 1 TABLET(5 TO 10 MG) BY MOUTH AT BEDTIME AS NEEDED FOR SLEEP    Past medical history, social, surgical and family history all reviewed in electronic medical record.  No pertanent information unless stated regarding to the chief complaint.   Review of Systems:  No headache, visual changes, nausea, vomiting, diarrhea, constipation, dizziness, abdominal pain, skin rash, fevers, chills, night sweats, weight loss, swollen lymph nodes, body aches, joint swelling, chest pain, shortness of breath, mood changes.  Positive muscle aches  Objective  Blood pressure 120/74, pulse 76, height 5\' 4"  (1.626 m), weight 183 lb (83 kg), last menstrual  period 01/06/2016, SpO2 98 %.   General: No apparent distress alert and oriented x3 mood and affect normal, dressed appropriately.  HEENT: Pupils equal, extraocular movements intact  Respiratory: Patient's speak in full sentences and does not appear short of breath  Cardiovascular: No lower extremity edema, non tender, no erythema  Skin: Warm dry intact with no signs of infection or rash on extremities or on axial skeleton.  Abdomen: Soft nontender  Neuro: Cranial nerves II through XII are intact, neurovascularly intact in all extremities with 2+ DTRs and 2+ pulses.  Lymph: No lymphadenopathy of posterior or anterior cervical chain or axillae bilaterally.  Gait  normal with good balance and coordination.  MSK:  Non tender with full range of motion and good stability and symmetric strength and tone of shoulders, , wrist, hip, knee and ankles bilaterally.  Right elbow does have mild tenderness still noted.  Improvement in range of motion though.  Less tenderness than before.  Still over the lateral epicondylar region  Back exam has loss of lordosis.  Increasing tenderness to palpation in the paraspinal musculature of the lumbar spine.  Positive straight leg test on the right side.  4+ out of 5 strength on the right leg compared to the contralateral side.  Deep tendon reflexes are intact  Limited musculoskeletal ultrasound was performed and interpreted by Lyndal Pulley  Limited ultrasound of the lateral epicondylar region does show that patient's intrasubstance tearing is significantly improved.  Approximately 70% improvement.  Significant decrease hypoechoic changes.   Impression and Recommendations:     This case required medical decision making of moderate complexity. The above documentation has been reviewed and is accurate and complete Lyndal Pulley, DO       Note: This dictation was prepared with Dragon dictation along with smaller phrase technology. Any transcriptional errors that  result from this process are unintentional.

## 2018-11-03 ENCOUNTER — Other Ambulatory Visit: Payer: BC Managed Care – PPO

## 2018-11-08 ENCOUNTER — Encounter: Payer: Self-pay | Admitting: Family Medicine

## 2018-11-09 HISTORY — PX: LUMBAR MICRODISCECTOMY: SHX99

## 2018-11-17 ENCOUNTER — Other Ambulatory Visit: Payer: Self-pay | Admitting: Family Medicine

## 2018-11-17 ENCOUNTER — Ambulatory Visit: Payer: BC Managed Care – PPO | Admitting: Family Medicine

## 2018-11-17 DIAGNOSIS — J309 Allergic rhinitis, unspecified: Secondary | ICD-10-CM

## 2018-11-17 NOTE — Telephone Encounter (Signed)
Is this okay to refill? Looks like I did it last time it came over as we have done it in the past. Your last note says she is under care of allergist, so not sure if I should send.

## 2018-11-21 ENCOUNTER — Other Ambulatory Visit: Payer: Self-pay | Admitting: Family Medicine

## 2018-11-21 DIAGNOSIS — G4700 Insomnia, unspecified: Secondary | ICD-10-CM

## 2018-11-21 DIAGNOSIS — F411 Generalized anxiety disorder: Secondary | ICD-10-CM

## 2018-11-21 NOTE — Telephone Encounter (Signed)
Are these okay to refill? 

## 2018-11-21 NOTE — Telephone Encounter (Signed)
Last alprazolam 07/25/18, last zolpidem 09/19/18. Okay to refill both. Done

## 2018-12-12 ENCOUNTER — Encounter: Payer: Self-pay | Admitting: Family Medicine

## 2018-12-12 DIAGNOSIS — N950 Postmenopausal bleeding: Secondary | ICD-10-CM

## 2018-12-20 ENCOUNTER — Encounter: Payer: Self-pay | Admitting: Family Medicine

## 2018-12-26 ENCOUNTER — Ambulatory Visit: Payer: BC Managed Care – PPO | Admitting: Obstetrics and Gynecology

## 2018-12-26 ENCOUNTER — Encounter: Payer: Self-pay | Admitting: Obstetrics and Gynecology

## 2018-12-26 ENCOUNTER — Other Ambulatory Visit: Payer: Self-pay

## 2018-12-26 ENCOUNTER — Other Ambulatory Visit (HOSPITAL_COMMUNITY)
Admission: RE | Admit: 2018-12-26 | Discharge: 2018-12-26 | Disposition: A | Discharge status: Home / Self Care | Payer: BC Managed Care – PPO | Source: Ambulatory Visit | Attending: Obstetrics and Gynecology | Admitting: Obstetrics and Gynecology

## 2018-12-26 VITALS — BP 102/60 | HR 80 | Resp 12 | Ht 64.0 in | Wt 183.8 lb

## 2018-12-26 DIAGNOSIS — N760 Acute vaginitis: Secondary | ICD-10-CM

## 2018-12-26 DIAGNOSIS — N950 Postmenopausal bleeding: Secondary | ICD-10-CM

## 2018-12-26 DIAGNOSIS — R87619 Unspecified abnormal cytological findings in specimens from cervix uteri: Secondary | ICD-10-CM

## 2018-12-26 DIAGNOSIS — Z124 Encounter for screening for malignant neoplasm of cervix: Secondary | ICD-10-CM | POA: Diagnosis not present

## 2018-12-26 DIAGNOSIS — Z79890 Hormone replacement therapy (postmenopausal): Secondary | ICD-10-CM | POA: Diagnosis not present

## 2018-12-26 NOTE — Patient Instructions (Signed)
Postmenopausal Bleeding  Postmenopausal bleeding is any bleeding that a woman has after she has entered into menopause. Menopause is the end of a woman's fertile years. After menopause, a woman no longer ovulates and does not have menstrual periods. Postmenopausal bleeding may have various causes, including:  Menopausal hormone therapy (MHT).  Endometrial atrophy. After menopause, low estrogen hormone levels cause the membrane that lines the uterus (endometrium) to become thinner. You may have bleeding as the endometrium thins.  Endometrial hyperplasia. This condition is caused by excess estrogen hormones and low levels of progesterone hormones. The excess estrogen causes the endometrium to thicken, which can lead to bleeding. In some cases, this can lead to cancer of the uterus.  Endometrial cancer.  Non-cancerous growths (polyps) on the endometrium, the lining of the uterus, or the cervix.  Uterine fibroids. These are non-cancerous growths in or around the uterus muscle tissue that can cause heavy bleeding. Any type of postmenopausal bleeding, even if it appears to be a typical menstrual period, should be evaluated by your health care provider. Treatment will depend on the cause of the bleeding. Follow these instructions at home:  Pay attention to any changes in your symptoms.  Avoid using tampons and douches as told by your health care provider.  Change your pads regularly.  Get regular pelvic exams and Pap tests.  Take iron supplements as told by your health care provider.  Take over-the-counter and prescription medicines only as told by your health care provider.  Keep all follow-up visits as told by your health care provider. This is important. Contact a health care provider if:  Your bleeding lasts more than 1 week.  You have abdominal pain.  You have bleeding with or after sexual intercourse.  You have bleeding that happens more often than every 3 weeks. Get help  right away if:  You have a fever, chills, headache, dizziness, muscle aches, and bleeding.  You have severe pain with bleeding.  You are passing blood clots.  You have heavy bleeding, need more than 1 pad an hour, and have never experienced this before.  You feel faint. Summary  Postmenopausal bleeding is any bleeding that a woman has after she has entered into menopause.  Postmenopausal bleeding may have various causes. Treatment will depend on the cause of the bleeding.  Any type of postmenopausal bleeding, even if it appears to be a typical menstrual period, should be evaluated by your health care provider.  Be sure to pay attention to any changes in your symptoms and keep all follow-up visits as told by your health care provider. This information is not intended to replace advice given to you by your health care provider. Make sure you discuss any questions you have with your health care provider. Document Released: 12/09/2005 Document Revised: 11/24/2016 Document Reviewed: 11/24/2016 Elsevier Interactive Patient Education  2019 [employer]  

## 2018-12-26 NOTE — Progress Notes (Signed)
GYNECOLOGY  VISIT   HPI: 53 y.o.   Domestic Partner  Caucasian  female   [place] with Patient's last menstrual period was 01/06/2016.   here for PMB. Patient has been on hormones since August 2019. Per patient, has had bleeding for the last two months.  Bleeding off and on, but more on than off.  Bleeding almost every day for the last 2 weeks.  Tampons hurt.  Changing pad a couple of times a day.  A little bit of cramping.   Started HRT for hot flashes and sweats for over one year.  Feeling better on HRT. More hot flashes recently.  Has not stopped her HRT.    Has doubled her Prometrium to 200 mg daily to control bleeding and this has not helped. Occasional peeling back of her transdermal patch but ok overall.   [place] 98.8 on 08/12/16.  Back surgery 7 weeks ago.  Recent steroid use.   PCP - Dr. Tomi Bamberger.  GYNECOLOGIC HISTORY: Patient's last menstrual period was 01/06/2016. Contraception:  Postmenopausal Menopausal hormone therapy:  Progesterone and Climara patch Last mammogram:  04/29/18 BIRADS 1 negative/density c Last pap smear:   08/12/16 Neg:Neg HR HPV -- patient has had an abnormal pap smear in 2001 -- Laser Conization was done -- normal paps since         OB History    Gravida  0   Para  0   Term  0   Preterm  0   AB  0   Living  0     SAB  0   TAB  0   Ectopic  0   Multiple  0   Live Births                 Patient Active Problem List   Diagnosis Date Noted  . Lateral epicondylitis of right elbow 07/12/2018  . Lumbar radiculopathy, right 07/12/2018  . Piriformis syndrome of right side 06/08/2018  . Status post cervical spinal fusion 01/27/2016  . Headache 05/13/2015  . Blurry vision 05/13/2015  . Perceived hearing changes 05/13/2015  . Migraine headache 04/29/2015  . Raynaud phenomenon 08/28/2013  . Depression, major, in remission ([place]) 08/28/2013  . Allergic conjunctivitis 05/09/2013  . Anxiety 03/09/2012  . Insomnia 03/09/2012  .  Allergic rhinitis 03/09/2012    Past Medical History:  Diagnosis Date  . 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 Allergic rhinitis, cause unspecified   . Anxiety   . Complication of anesthesia    "difficulty awakening"  . Depression   . Headache   . Insomnia   . Vitamin D deficiency     Past Surgical History:  Procedure Laterality Date  . ANKLE SURGERY  child   left for torn ligaments, left  . ANTERIOR CERVICAL DECOMP/DISCECTOMY FUSION N/A 01/27/2016   Procedure: C5-6, C6-7 Anterior Cervical Discectomy and Fusion, Allograft, Plate;  Surgeon: Marybelle Killings, MD;  Location: [facility];  Service: Orthopedics;  Laterality: N/A;  . CERVICAL CONIZATION W/BX  2001  . [place] SURGERY  2020  . NASAL SEPTOPLASTY W/ TURBINOPLASTY Bilateral 10/07/2015   Procedure: NASAL SEPTOPLASTY WITH BILATERAL TURBINATE REDUCTION;  Surgeon: Leta Baptist, MD;  Location: [facility];  Service: ENT;  Laterality: Bilateral;  . PILONIDAL CYST EXCISION      Current Outpatient Medications  Medication Sig Dispense Refill  . ALPRAZolam (XANAX) 0.5 MG tablet TAKE 1/2 TO 1 TABLET BY MOUTH THREE TIMES DAILY  AS NEEDED FOR ANXIETY 20 tablet 0  . buPROPion (WELLBUTRIN XL) 300 MG 24 hr tablet TAKE 1 TABLET BY MOUTH EVERY MORNING 90 tablet 3  . Cholecalciferol (VITAMIN D) 2000 UNITS tablet Take 2,000 Units by mouth daily.    . citalopram (CELEXA) 20 MG tablet TAKE 1 TABLET BY MOUTH ONCE DAILY 90 tablet 3  . cyclobenzaprine (FLEXERIL) 10 MG tablet TK 1 T PO BID    . EPIPEN 2-PAK 0.3 MG/0.3ML SOAJ injection Reported on 02/17/2016  0  . estradiol (CLIMARA - DOSED IN MG/24 HR) 0.05 mg/24hr patch APPLY 1 PATCH ONTO THE SKIN ONCE A WEEK 4 patch 5  . fluticasone (FLONASE) 50 MCG/ACT nasal spray INSTILL 2 SPRAYS INTO EACH NOSTRIL twice a day  0  . gabapentin (NEURONTIN) 100 MG capsule Take 2 capsules (200 mg total) by mouth at bedtime. 180 capsule 1  . guaiFENesin (MUCINEX) 600 MG 12 hr tablet  Take 1,200 mg by mouth 2 (two) times daily.    Marland Kitchen HYDROcodone-acetaminophen (NORCO/VICODIN) 5-325 MG tablet Take 1 tablet by mouth every 6 (six) hours as needed.    Marland Kitchen levocetirizine (XYZAL) 5 MG tablet TAKE 1 TABLET BY MOUTH EVERY EVENING 30 tablet 8  . olopatadine (PATANOL) 0.1 % ophthalmic solution place 1 drop into both eyes twice a day 5 mL 5  . progesterone (PROMETRIUM) 100 MG capsule TAKE 1 CAPSULE(100 MG) BY MOUTH DAILY 30 capsule 5  . zolpidem (AMBIEN) 10 MG tablet TAKE 1/2 TO 1 TABLET(5 TO 10 MG) BY MOUTH AT BEDTIME AS NEEDED FOR SLEEP 30 tablet 1   No current facility-administered medications for this visit.      ALLERGIES: Codeine and Pamelor [nortriptyline]  Family History  Problem Relation Age of Onset  . Hypertension Mother   . Diabetes Mother   . Heart disease Father   . Depression Sister   . Breast cancer Cousin   . Colon cancer Maternal Grandmother         died at 34  . Diabetes Maternal Grandmother   . Alzheimer's disease Paternal Grandmother   . Colon polyps Maternal Uncle   . Migraines Neg Hx     Social History   Socioeconomic History  . Marital status: Soil scientist    Spouse name: Not on file  . Number of children: 0  . Years of education: 70  . Highest education level: Not on file  Occupational History  . Occupation: Naval architect: [employer]  Social Needs  . Financial resource strain: Not on file  . Food insecurity:    Worry: Not on file    Inability: Not on file  . Transportation needs:    Medical: Not on file    Non-medical: Not on file  Tobacco Use  . Smoking status: Never Smoker  . Smokeless tobacco: Never Used  Substance and Sexual Activity  . Alcohol use: No    Alcohol/week: 0.0 standard drinks  . Drug use: Never  . Sexual activity: Not Currently    Partners: Female    Birth control/protection: Abstinence  Lifestyle  . Physical activity:    Days per week: Not on file    Minutes per session: Not on file  .  Stress: Not on file  Relationships  . Social connections:    Talks on phone: Not on file    Gets together: Not on file    Attends religious service: Not on file    Active member of club or organization: Not  on file    Attends meetings of clubs or organizations: Not on file    Relationship status: Not on file  . Intimate partner violence:    Fear of current or ex partner: Not on file    Emotionally abused: Not on file    Physically abused: Not on file    Forced sexual activity: Not on file  Other Topics Concern  . Not on file  Social History Narrative   Lives with female partner Juliann Pulse), 2 beagles    Review of Systems  Constitutional: Negative.   HENT: Negative.   Eyes: Negative.   Respiratory: Negative.   Cardiovascular: Negative.   Gastrointestinal: Negative.   Endocrine: Negative.   Genitourinary: Positive for vaginal bleeding.  Musculoskeletal: Negative.   Skin: Negative.   Allergic/Immunologic: Negative.   Neurological: Negative.   Hematological: Negative.   Psychiatric/Behavioral: Negative.     PHYSICAL EXAMINATION:    BP 102/60 (BP Location: Left Arm, Patient Position: Sitting, Cuff Size: Large)   Pulse 80   Resp 12   Ht 5\' 4"  (1.626 m)   Wt 183 lb 12.8 oz (83.4 kg)   LMP 01/06/2016   BMI 31.55 kg/m     General appearance: alert, cooperative and appears stated age Head: Normocephalic, without obvious abnormality, atraumatic Lungs: clear to auscultation bilaterally Heart: regular rate and rhythm Abdomen: soft, non-tender, no masses,  no organomegaly Extremities: extremities normal, atraumatic, no cyanosis or edema Skin: Skin color, texture, turgor normal. No rashes or lesions Lymph nodes: Cervical, supraclavicular nodes normal. No abnormal inguinal nodes palpated Neurologic: Grossly normal  Pelvic: External genitalia:  no lesions              Urethra:  normal appearing urethra with no masses, tenderness or lesions              Bartholins and Skenes:  normal                 Vagina: normal appearing vagina with normal color and curd like white/green discharge, no lesions              Cervix: no lesions                Bimanual Exam:  Uterus:  normal size (resting more to the right), 1.5 cm right lateral fibroid?, normal mobility, non-tender              Adnexa: no mass, fullness, tenderness              Rectal exam: Yes.    Confirms.              Anus:  normal sphincter tone, no lesions  Chaperone was present for exam.  ASSESSMENT  Postmenopausal bleeding on HRT. Hx conization of cervix. Possible fibroid on exam? Recent steroid use.  PLAN  Discussed postmenopausal bleeding. Affirm vaginitis testing.  Pap and HR HPV testing.  Return for sonohysterogram/EMB. Ok to continue HRT for now.  Questions invited and answered.   An After Visit Summary was printed and given to the patient.  __30____ minutes face to face time of which over 50% was spent in counseling.

## 2018-12-27 LAB — VAGINITIS/VAGINOSIS, DNA PROBE
Candida Species: POSITIVE — AB
Gardnerella vaginalis: NEGATIVE
Trichomonas vaginosis: NEGATIVE

## 2018-12-28 ENCOUNTER — Telehealth: Payer: Self-pay

## 2018-12-28 ENCOUNTER — Other Ambulatory Visit: Payer: Self-pay

## 2018-12-28 DIAGNOSIS — R87619 Unspecified abnormal cytological findings in specimens from cervix uteri: Secondary | ICD-10-CM

## 2018-12-28 LAB — CYTOLOGY - PAP: HPV: NOT DETECTED

## 2018-12-28 MED ORDER — FLUCONAZOLE 150 MG PO TABS: 150.0000 mg | ORAL_TABLET | Freq: Once | ORAL | 0 refills | Status: AC

## 2018-12-28 NOTE — Telephone Encounter (Signed)
Spoke with patient. Results given. Patient verbalizes understanding. Rx for Diflucan 150 mg po x 1 repeat in 72 hours if symptoms persist #2 0RF sent to pharmacy on file. Colposcopy scheduled for 01/06/2019 at 9:30 am with Dr.Silva.  Instructions given. Motrin 800 mg po x , one hour before appointment with food. Make sure to eat a meal before appointment and drink plenty of fluids.Patient agreeable and verbalized understanding of all instructions. Order placed for precert. Encounter closed.  Notes recorded by Nunzio Cobbs, MD on 12/28/2018 at 12:08 PM EDT Please share result of pap showing AGUS (atypical glandular cells) and yeast.  See separate result note regarding her Affirm testing showing yeast also. I recommended a course of Diflucan for this.   Evaluation of her pap will need to be colposcopy and endometrial biopsy.   She is coming in tomorrow for a pelvic ultrasound and endometrial biopsy for her postmenopausal bleeding.   Once this is completed, she will need a separate visit for colposcopy.  Please send colposcopy to precert.   Cc- Lamont Snowball ------  Notes recorded by Nunzio Cobbs, MD on 12/27/2018 at 6:41 PM EDT Please contact patient with results of Affirm showing yeast vaginitis.  We were expecting this after her office visit.  I am recommending Diflucan 150 mg po x 1. May repeat in 72 hours prn.  Disp: 2, RF none.   Please have her keep her appt for 12/29/18 for her ultrasound appt.

## 2018-12-29 ENCOUNTER — Encounter: Payer: Self-pay | Admitting: Obstetrics and Gynecology

## 2018-12-29 ENCOUNTER — Other Ambulatory Visit: Payer: Self-pay

## 2018-12-29 ENCOUNTER — Ambulatory Visit (INDEPENDENT_AMBULATORY_CARE_PROVIDER_SITE_OTHER): Payer: BC Managed Care – PPO

## 2018-12-29 ENCOUNTER — Ambulatory Visit (INDEPENDENT_AMBULATORY_CARE_PROVIDER_SITE_OTHER): Payer: BC Managed Care – PPO | Admitting: Obstetrics and Gynecology

## 2018-12-29 VITALS — BP 130/86 | HR 76 | Temp 98.3°F | Ht 64.0 in | Wt 187.0 lb

## 2018-12-29 DIAGNOSIS — N950 Postmenopausal bleeding: Secondary | ICD-10-CM

## 2018-12-29 DIAGNOSIS — N959 Unspecified menopausal and perimenopausal disorder: Secondary | ICD-10-CM | POA: Diagnosis not present

## 2018-12-29 DIAGNOSIS — R87619 Unspecified abnormal cytological findings in specimens from cervix uteri: Secondary | ICD-10-CM | POA: Diagnosis not present

## 2018-12-29 DIAGNOSIS — Z79890 Hormone replacement therapy (postmenopausal): Secondary | ICD-10-CM

## 2018-12-29 MED ORDER — PROGESTERONE MICRONIZED 100 MG PO CAPS: ORAL_CAPSULE | ORAL | 0 refills | Status: DC

## 2018-12-29 NOTE — Patient Instructions (Signed)

## 2018-12-29 NOTE — Progress Notes (Deleted)
GYNECOLOGY  VISIT   HPI: 53 y.o.   Domestic Partner  {Race/ethnicity:[id number]}  female   [place] with Patient's last menstrual period was 01/06/2016.   here for     GYNECOLOGIC HISTORY: Patient's last menstrual period was 01/06/2016. Contraception:  *** Menopausal hormone therapy:  *** Last mammogram:  *** Last pap smear:   ***        OB History    Gravida  0   Para  0   Term  0   Preterm  0   AB  0   Living  0     SAB  0   TAB  0   Ectopic  0   Multiple  0   Live Births                 Patient Active Problem List   Diagnosis Date Noted  . Postmenopausal bleeding 12/26/2018  . Lateral epicondylitis of right elbow 07/12/2018  . Lumbar radiculopathy, right 07/12/2018  . Piriformis syndrome of right side 06/08/2018  . Status post cervical spinal fusion 01/27/2016  . Headache 05/13/2015  . Blurry vision 05/13/2015  . Perceived hearing changes 05/13/2015  . Migraine headache 04/29/2015  . Raynaud phenomenon 08/28/2013  . Depression, major, in remission ([place]) 08/28/2013  . Allergic conjunctivitis 05/09/2013  . Anxiety 03/09/2012  . Insomnia 03/09/2012  . Allergic rhinitis 03/09/2012    Past Medical History:  Diagnosis Date  . 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 Allergic rhinitis, cause unspecified   . Anxiety   . Complication of anesthesia    "difficulty awakening"  . Depression   . Headache   . Insomnia   . Vitamin D deficiency     Past Surgical History:  Procedure Laterality Date  . ANKLE SURGERY  child   left for torn ligaments, left  . ANTERIOR CERVICAL DECOMP/DISCECTOMY FUSION N/A 01/27/2016   Procedure: C5-6, C6-7 Anterior Cervical Discectomy and Fusion, Allograft, Plate;  Surgeon: Marybelle Killings, MD;  Location: [facility];  Service: Orthopedics;  Laterality: N/A;  . CERVICAL CONIZATION W/BX  2001  . [place] SURGERY  2020  . NASAL SEPTOPLASTY W/ TURBINOPLASTY Bilateral 10/07/2015   Procedure: NASAL SEPTOPLASTY  WITH BILATERAL TURBINATE REDUCTION;  Surgeon: Leta Baptist, MD;  Location: [facility];  Service: ENT;  Laterality: Bilateral;  . PILONIDAL CYST EXCISION      Current Outpatient Medications  Medication Sig Dispense Refill  . ALPRAZolam (XANAX) 0.5 MG tablet TAKE 1/2 TO 1 TABLET BY MOUTH THREE TIMES DAILY AS NEEDED FOR ANXIETY 20 tablet 0  . buPROPion (WELLBUTRIN XL) 300 MG 24 hr tablet TAKE 1 TABLET BY MOUTH EVERY MORNING 90 tablet 3  . Cholecalciferol (VITAMIN D) 2000 UNITS tablet Take 2,000 Units by mouth daily.    . citalopram (CELEXA) 20 MG tablet TAKE 1 TABLET BY MOUTH ONCE DAILY 90 tablet 3  . cyclobenzaprine (FLEXERIL) 10 MG tablet TK 1 T PO BID    . EPIPEN 2-PAK 0.3 MG/0.3ML SOAJ injection Reported on 02/17/2016  0  . estradiol (CLIMARA - DOSED IN MG/24 HR) 0.05 mg/24hr patch APPLY 1 PATCH ONTO THE SKIN ONCE A WEEK 4 patch 5  . fluticasone (FLONASE) 50 MCG/ACT nasal spray INSTILL 2 SPRAYS INTO EACH NOSTRIL twice a day  0  . gabapentin (NEURONTIN) 100 MG capsule Take 2 capsules (200 mg total) by mouth at bedtime. 180 capsule 1  . guaiFENesin (MUCINEX) 600 MG 12 hr  tablet Take 1,200 mg by mouth 2 (two) times daily.    Marland Kitchen HYDROcodone-acetaminophen (NORCO/VICODIN) 5-325 MG tablet Take 1 tablet by mouth every 6 (six) hours as needed.    Marland Kitchen levocetirizine (XYZAL) 5 MG tablet TAKE 1 TABLET BY MOUTH EVERY EVENING 30 tablet 8  . olopatadine (PATANOL) 0.1 % ophthalmic solution place 1 drop into both eyes twice a day 5 mL 5  . progesterone (PROMETRIUM) 100 MG capsule TAKE 1 CAPSULE(100 MG) BY MOUTH DAILY 30 capsule 5  . zolpidem (AMBIEN) 10 MG tablet TAKE 1/2 TO 1 TABLET(5 TO 10 MG) BY MOUTH AT BEDTIME AS NEEDED FOR SLEEP 30 tablet 1   No current facility-administered medications for this visit.      ALLERGIES: Codeine and Pamelor [nortriptyline]  Family History  Problem Relation Age of Onset  . Hypertension Mother   . Diabetes Mother   . Heart disease Father   . Depression Sister    . Breast cancer Cousin   . Colon cancer Maternal Grandmother         died at 55  . Diabetes Maternal Grandmother   . Alzheimer's disease Paternal Grandmother   . Colon polyps Maternal Uncle   . Migraines Neg Hx     Social History   Socioeconomic History  . Marital status: Soil scientist    Spouse name: Not on file  . Number of children: 0  . Years of education: 17  . Highest education level: Not on file  Occupational History  . Occupation: Naval architect: [employer]  Social Needs  . Financial resource strain: Not on file  . Food insecurity:    Worry: Not on file    Inability: Not on file  . Transportation needs:    Medical: Not on file    Non-medical: Not on file  Tobacco Use  . Smoking status: Never Smoker  . Smokeless tobacco: Never Used  Substance and Sexual Activity  . Alcohol use: No    Alcohol/week: 0.0 standard drinks  . Drug use: Never  . Sexual activity: Not Currently    Partners: Female    Birth control/protection: Abstinence  Lifestyle  . Physical activity:    Days per week: Not on file    Minutes per session: Not on file  . Stress: Not on file  Relationships  . Social connections:    Talks on phone: Not on file    Gets together: Not on file    Attends religious service: Not on file    Active member of club or organization: Not on file    Attends meetings of clubs or organizations: Not on file    Relationship status: Not on file  . Intimate partner violence:    Fear of current or ex partner: Not on file    Emotionally abused: Not on file    Physically abused: Not on file    Forced sexual activity: Not on file  Other Topics Concern  . Not on file  Social History Narrative   Lives with female partner Juliann Pulse), 2 beagles    Review of Systems  PHYSICAL EXAMINATION:    LMP 01/06/2016     General appearance: alert, cooperative and appears stated age   Pelvic US  Sonohysterogram/EMB Consent for procedures.  Sterile prep  with Hibiclens.  Tenaculum to anterior cervical lip Normal saline injected into endometrial cavity.  Filling defect noted:  _________________ x ________________. Reprep with Hibiclens. Tenaculum to anterior cervix.  Ppelle to almost 7  cm twice.  Tissue to pathology.  Minimal EBL.  No complications.   Chaperone was present for exam.  ASSESSMENT  Postmenopausal bleeding.  AGUS pap.  Hx cervical dysplasia.  Endocervical and endometrial masses.  Suspected polyps.   PLAN  FU EMB.  Post procedure instructions given.  Discussion of abnormal pap, endometrial suspected polyp.    An After Visit Summary was printed and given to the patient.  ______ minutes face to face time of which over 50% was spent in counseling.

## 2018-12-29 NOTE — Progress Notes (Signed)
GYNECOLOGY  VISIT   HPI: 53 y.o.   Domestic Partner  Caucasian  female   [place] with Patient's last menstrual period was 01/06/2016.   here for Pelvic US for postmenopausal bleeding on HRT.   She is using Climara 0.05 mg.   She was taking Prometrium 200 mg recently due to her vaginal bleeding and she has run out.   Patient was also just dx with AGUS pap.   GYNECOLOGIC HISTORY: Patient's last menstrual period was 01/06/2016. Contraception:  PMP Menopausal hormone therapy:  Climara and progesterone  Last mammogram:  04/29/18 BIRADS1:Neg  Last pap smear:   12/26/18 Atypical glandular cells. HR HPV:Neg         OB History    Gravida  0   Para  0   Term  0   Preterm  0   AB  0   Living  0     SAB  0   TAB  0   Ectopic  0   Multiple  0   Live Births                 Patient Active Problem List   Diagnosis Date Noted  . Postmenopausal bleeding 12/26/2018  . Lateral epicondylitis of right elbow 07/12/2018  . Lumbar radiculopathy, right 07/12/2018  . Piriformis syndrome of right side 06/08/2018  . Status post cervical spinal fusion 01/27/2016  . Headache 05/13/2015  . Blurry vision 05/13/2015  . Perceived hearing changes 05/13/2015  . Migraine headache 04/29/2015  . Raynaud phenomenon 08/28/2013  . Depression, major, in remission ([place]) 08/28/2013  . Allergic conjunctivitis 05/09/2013  . Anxiety 03/09/2012  . Insomnia 03/09/2012  . Allergic rhinitis 03/09/2012    Past Medical History:  Diagnosis Date  . 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 Allergic rhinitis, cause unspecified   . Anxiety   . Complication of anesthesia    "difficulty awakening"  . Depression   . Headache   . Insomnia   . Vitamin D deficiency     Past Surgical History:  Procedure Laterality Date  . ANKLE SURGERY  child   left for torn ligaments, left  . ANTERIOR CERVICAL DECOMP/DISCECTOMY FUSION N/A 01/27/2016   Procedure: C5-6, C6-7 Anterior Cervical  Discectomy and Fusion, Allograft, Plate;  Surgeon: Marybelle Killings, MD;  Location: [facility];  Service: Orthopedics;  Laterality: N/A;  . CERVICAL CONIZATION W/BX  2001  . [place] SURGERY  2020  . NASAL SEPTOPLASTY W/ TURBINOPLASTY Bilateral 10/07/2015   Procedure: NASAL SEPTOPLASTY WITH BILATERAL TURBINATE REDUCTION;  Surgeon: Leta Baptist, MD;  Location: [facility];  Service: ENT;  Laterality: Bilateral;  . PILONIDAL CYST EXCISION      Current Outpatient Medications  Medication Sig Dispense Refill  . ALPRAZolam (XANAX) 0.5 MG tablet TAKE 1/2 TO 1 TABLET BY MOUTH THREE TIMES DAILY AS NEEDED FOR ANXIETY 20 tablet 0  . azelastine (ASTELIN) 0.1 % nasal spray     . buPROPion (WELLBUTRIN XL) 300 MG 24 hr tablet TAKE 1 TABLET BY MOUTH EVERY MORNING 90 tablet 3  . Cholecalciferol (VITAMIN D) 2000 UNITS tablet Take 2,000 Units by mouth daily.    . citalopram (CELEXA) 20 MG tablet TAKE 1 TABLET BY MOUTH ONCE DAILY 90 tablet 3  . cyclobenzaprine (FLEXERIL) 10 MG tablet TK 1 T PO BID    . EPIPEN 2-PAK 0.3 MG/0.3ML SOAJ injection Reported on 02/17/2016  0  . estradiol (CLIMARA - DOSED IN  MG/24 HR) 0.05 mg/24hr patch APPLY 1 PATCH ONTO THE SKIN ONCE A WEEK 4 patch 5  . fluconazole (DIFLUCAN) 150 MG tablet     . fluticasone (FLONASE) 50 MCG/ACT nasal spray INSTILL 2 SPRAYS INTO EACH NOSTRIL twice a day  0  . gabapentin (NEURONTIN) 100 MG capsule Take 2 capsules (200 mg total) by mouth at bedtime. 180 capsule 1  . guaiFENesin (MUCINEX) 600 MG 12 hr tablet Take 1,200 mg by mouth 2 (two) times daily.    Marland Kitchen HYDROcodone-acetaminophen (NORCO/VICODIN) 5-325 MG tablet Take 1 tablet by mouth every 6 (six) hours as needed.    Marland Kitchen levocetirizine (XYZAL) 5 MG tablet TAKE 1 TABLET BY MOUTH EVERY EVENING 30 tablet 8  . olopatadine (PATANOL) 0.1 % ophthalmic solution place 1 drop into both eyes twice a day 5 mL 5  . progesterone (PROMETRIUM) 100 MG capsule TAKE 1 CAPSULE(100 MG) BY MOUTH DAILY 30 capsule 5  .  zolpidem (AMBIEN) 10 MG tablet TAKE 1/2 TO 1 TABLET(5 TO 10 MG) BY MOUTH AT BEDTIME AS NEEDED FOR SLEEP 30 tablet 1   No current facility-administered medications for this visit.      ALLERGIES: Codeine and Pamelor [nortriptyline]  Family History  Problem Relation Age of Onset  . Hypertension Mother   . Diabetes Mother   . Heart disease Father   . Depression Sister   . Breast cancer Cousin   . Colon cancer Maternal Grandmother         died at 7  . Diabetes Maternal Grandmother   . Alzheimer's disease Paternal Grandmother   . Colon polyps Maternal Uncle   . Migraines Neg Hx     Social History   Socioeconomic History  . Marital status: Soil scientist    Spouse name: Not on file  . Number of children: 0  . Years of education: 50  . Highest education level: Not on file  Occupational History  . Occupation: Naval architect: [employer]  Social Needs  . Financial resource strain: Not on file  . Food insecurity:    Worry: Not on file    Inability: Not on file  . Transportation needs:    Medical: Not on file    Non-medical: Not on file  Tobacco Use  . Smoking status: Never Smoker  . Smokeless tobacco: Never Used  Substance and Sexual Activity  . Alcohol use: No    Alcohol/week: 0.0 standard drinks  . Drug use: Never  . Sexual activity: Not Currently    Partners: Female    Birth control/protection: Abstinence  Lifestyle  . Physical activity:    Days per week: Not on file    Minutes per session: Not on file  . Stress: Not on file  Relationships  . Social connections:    Talks on phone: Not on file    Gets together: Not on file    Attends religious service: Not on file    Active member of club or organization: Not on file    Attends meetings of clubs or organizations: Not on file    Relationship status: Not on file  . Intimate partner violence:    Fear of current or ex partner: Not on file    Emotionally abused: Not on file    Physically abused:  Not on file    Forced sexual activity: Not on file  Other Topics Concern  . Not on file  Social History Narrative   Lives with female partner Juliann Pulse),  2 beagles    Review of Systems  All other systems reviewed and are negative.   PHYSICAL EXAMINATION:    BP 130/86   Pulse 76   Temp 98.3 F (36.8 C) (Oral)   Ht 5\' 4"  (1.626 m)   Wt 187 lb (84.8 kg)   LMP 01/06/2016   BMI 32.10 kg/m     General appearance: alert, cooperative and appears stated age   Pelvic US Uterus no masses.  EMS 9.58 mm, echogenic.  Ovaries normal.  No free fluid.   Sonohysterogram and EMB. Consent for procedures.  Sterile prep with Hibiclens.  Tenaculum to anterior cervical lip.  Cannula placed and sterile saline injected.  2 filling defects - 22 x 12 mm at fundus and smaller possible cervical mass noted.  Reprep with Hibiclens.  Teraculum to anterior cervical lip.  Pipelle passed to 6 cm x 2.  Tissue to pathology.  Minimal EBL.  No complications.   Chaperone was present for exam.  ASSESSMENT  Postmenopausal bleeding on HRT.  Endometrial mass and possible intracervical mass.  AGUS pap.  Hx cervical conization.   PLAN  Discussion of endometrial and endocervical masses and recent AGUS pap.  Follow up results of EMB.  Post biopsy instructions given.  Return for colposcopy and ECC next week.  Refill of Prometrium 100 mg x 30 days.  Final plan to follow. At a minimum, I expect patient will need a hysteroscopy, Myosure resection of endometrial mass, and dilation and curettage.  Will get a copy of her prior conization report.    An After Visit Summary was printed and given to the patient.  ___25___ minutes face to face time of which over 50% was spent in counseling.

## 2019-01-03 ENCOUNTER — Encounter: Payer: Self-pay | Admitting: Obstetrics and Gynecology

## 2019-01-05 NOTE — Progress Notes (Deleted)
GYNECOLOGY  VISIT   HPI: 53 y.o.   Domestic Partner  Caucasian  female   [place] with Patient's last menstrual period was 01/06/2016.   here for colposcopy   GYNECOLOGIC HISTORY: Patient's last menstrual period was 01/06/2016. Contraception:  Post menopausal Menopausal hormone therapy:  Climara & progesterone Last mammogram:  04-29-18 birads 1:neg Last pap smear:   12-26-2018 Atypical glandular cells HPV HR:neg        OB History    Gravida  0   Para  0   Term  0   Preterm  0   AB  0   Living  0     SAB  0   TAB  0   Ectopic  0   Multiple  0   Live Births                 Patient Active Problem List   Diagnosis Date Noted  . Postmenopausal bleeding 12/26/2018  . Lateral epicondylitis of right elbow 07/12/2018  . Lumbar radiculopathy, right 07/12/2018  . Piriformis syndrome of right side 06/08/2018  . Status post cervical spinal fusion 01/27/2016  . Headache 05/13/2015  . Blurry vision 05/13/2015  . Perceived hearing changes 05/13/2015  . Migraine headache 04/29/2015  . Raynaud phenomenon 08/28/2013  . Depression, major, in remission ([place]) 08/28/2013  . Allergic conjunctivitis 05/09/2013  . Anxiety 03/09/2012  . Insomnia 03/09/2012  . Allergic rhinitis 03/09/2012    Past Medical History:  Diagnosis Date  . 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 Allergic rhinitis, cause unspecified   . Anxiety   . Complication of anesthesia    "difficulty awakening"  . Depression   . Headache   . Insomnia   . Vitamin D deficiency     Past Surgical History:  Procedure Laterality Date  . ANKLE SURGERY  child   left for torn ligaments, left  . ANTERIOR CERVICAL DECOMP/DISCECTOMY FUSION N/A 01/27/2016   Procedure: C5-6, C6-7 Anterior Cervical Discectomy and Fusion, Allograft, Plate;  Surgeon: Marybelle Killings, MD;  Location: [place];  Service: Orthopedics;  Laterality: N/A;  . CERVICAL CONIZATION W/BX  2001   L[facility] con[facility] - Dr. Rhodia Albright [facility]  [facility] - Moderate to severe dysplasia, negative margins  . [place] SURGERY  2020  . NASAL SEPTOPLASTY W/ TURBINOPLASTY Bilateral 10/07/2015   Procedure: NASAL SEPTOPLASTY WITH BILATERAL TURBINATE REDUCTION;  Surgeon: Leta Baptist, MD;  Location: [place];  Service: ENT;  Laterality: Bilateral;  . PILONIDAL CYST EXCISION      Current Outpatient Medications  Medication Sig Dispense Refill  . ALPRAZolam (XANAX) 0.5 MG tablet TAKE 1/2 TO 1 TABLET BY MOUTH THREE TIMES DAILY AS NEEDED FOR ANXIETY 20 tablet 0  . azelastine (ASTELIN) 0.1 % nasal spray     . buPROPion (WELLBUTRIN XL) 300 MG 24 hr tablet TAKE 1 TABLET BY MOUTH EVERY MORNING 90 tablet 3  . Cholecalciferol (VITAMIN D) 2000 UNITS tablet Take 2,000 Units by mouth daily.    . citalopram (CELEXA) 20 MG tablet TAKE 1 TABLET BY MOUTH ONCE DAILY 90 tablet 3  . cyclobenzaprine (FLEXERIL) 10 MG tablet TK 1 T PO BID    . EPIPEN 2-PAK 0.3 MG/0.3ML SOAJ injection Reported on 02/17/2016  0  . estradiol (CLIMARA - DOSED IN MG/24 HR) 0.05 mg/24hr patch APPLY 1 PATCH ONTO THE SKIN ONCE A WEEK 4 patch 5  . fluconazole (DIFLUCAN) 150 MG tablet     .  fluticasone (FLONASE) 50 MCG/ACT nasal spray INSTILL 2 SPRAYS INTO EACH NOSTRIL twice a day  0  . gabapentin (NEURONTIN) 100 MG capsule Take 2 capsules (200 mg total) by mouth at bedtime. 180 capsule 1  . guaiFENesin (MUCINEX) 600 MG 12 hr tablet Take 1,200 mg by mouth 2 (two) times daily.    Marland Kitchen HYDROcodone-acetaminophen (NORCO/VICODIN) 5-325 MG tablet Take 1 tablet by mouth every 6 (six) hours as needed.    Marland Kitchen levocetirizine (XYZAL) 5 MG tablet TAKE 1 TABLET BY MOUTH EVERY EVENING 30 tablet 8  . olopatadine (PATANOL) 0.1 % ophthalmic solution place 1 drop into both eyes twice a day 5 mL 5  . progesterone (PROMETRIUM) 100 MG capsule TAKE 1 CAPSULE(100 MG) BY MOUTH DAILY 30 capsule 0  . zolpidem (AMBIEN) 10 MG tablet TAKE 1/2 TO 1 TABLET(5 TO 10 MG) BY MOUTH AT BEDTIME AS NEEDED FOR SLEEP 30  tablet 1   No current facility-administered medications for this visit.      ALLERGIES: Codeine and Pamelor [nortriptyline]  Family History  Problem Relation Age of Onset  . Hypertension Mother   . Diabetes Mother   . Heart disease Father   . Depression Sister   . Breast cancer Cousin   . Colon cancer Maternal Grandmother         died at 54  . Diabetes Maternal Grandmother   . Alzheimer's disease Paternal Grandmother   . Colon polyps Maternal Uncle   . Migraines Neg Hx     Social History   Socioeconomic History  . Marital status: Soil scientist    Spouse name: Not on file  . Number of children: 0  . Years of education: 107  . Highest education level: Not on file  Occupational History  . Occupation: Naval architect: [employer]  Social Needs  . Financial resource strain: Not on file  . Food insecurity:    Worry: Not on file    Inability: Not on file  . Transportation needs:    Medical: Not on file    Non-medical: Not on file  Tobacco Use  . Smoking status: Never Smoker  . Smokeless tobacco: Never Used  Substance and Sexual Activity  . Alcohol use: No    Alcohol/week: 0.0 standard drinks  . Drug use: Never  . Sexual activity: Not Currently    Partners: Female    Birth control/protection: Abstinence  Lifestyle  . Physical activity:    Days per week: Not on file    Minutes per session: Not on file  . Stress: Not on file  Relationships  . Social connections:    Talks on phone: Not on file    Gets together: Not on file    Attends religious service: Not on file    Active member of club or organization: Not on file    Attends meetings of clubs or organizations: Not on file    Relationship status: Not on file  . Intimate partner violence:    Fear of current or ex partner: Not on file    Emotionally abused: Not on file    Physically abused: Not on file    Forced sexual activity: Not on file  Other Topics Concern  . Not on file  Social  History Narrative   Lives with female partner Juliann Pulse), 2 beagles    Review of Systems  PHYSICAL EXAMINATION:    LMP 01/06/2016     General appearance: alert, cooperative and appears stated age Head:  Normocephalic, without obvious abnormality, atraumatic Neck: no adenopathy, supple, symmetrical, trachea midline and thyroid normal to inspection and palpation Lungs: clear to auscultation bilaterally Breasts: normal appearance, no masses or tenderness, No nipple retraction or dimpling, No nipple discharge or bleeding, No axillary or supraclavicular adenopathy Heart: regular rate and rhythm Abdomen: soft, non-tender, no masses,  no organomegaly Extremities: extremities normal, atraumatic, no cyanosis or edema Skin: Skin color, texture, turgor normal. No rashes or lesions Lymph nodes: Cervical, supraclavicular, and axillary nodes normal. No abnormal inguinal nodes palpated Neurologic: Grossly normal  Pelvic: External genitalia:  no lesions              Urethra:  normal appearing urethra with no masses, tenderness or lesions              Bartholins and Skenes: normal                 Vagina: normal appearing vagina with normal color and discharge, no lesions              Cervix: no lesions                Bimanual Exam:  Uterus:  normal size, contour, position, consistency, mobility, non-tender              Adnexa: no mass, fullness, tenderness              Rectal exam: {yes no:[id number]}.  Confirms.              Anus:  normal sphincter tone, no lesions  Chaperone was present for exam.  ASSESSMENT     PLAN     An After Visit Summary was printed and given to the patient.  ______ minutes face to face time of which over 50% was spent in counseling.

## 2019-01-06 ENCOUNTER — Encounter: Payer: Self-pay | Admitting: Obstetrics and Gynecology

## 2019-01-06 ENCOUNTER — Ambulatory Visit (INDEPENDENT_AMBULATORY_CARE_PROVIDER_SITE_OTHER): Payer: BC Managed Care – PPO | Admitting: Obstetrics and Gynecology

## 2019-01-06 ENCOUNTER — Other Ambulatory Visit: Payer: Self-pay

## 2019-01-06 VITALS — BP 102/70 | HR 70 | Temp 98.4°F | Resp 16 | Wt 186.0 lb

## 2019-01-06 DIAGNOSIS — N840 Polyp of corpus uteri: Secondary | ICD-10-CM

## 2019-01-06 DIAGNOSIS — R87619 Unspecified abnormal cytological findings in specimens from cervix uteri: Secondary | ICD-10-CM

## 2019-01-06 NOTE — Progress Notes (Signed)
  Subjective:     Patient ID: Brandi Carpenter, female   DOB: 1966-07-26, 53 y.o.   MRN: [id number]  HPI Here for colposcopy. 12-26-2018 pap showed AGUS. She has had postmenopausal bleeding as well.  Her sonohysterogram showed an endometrial mass and a potential endocervical mass. EMB showed benign endometrial polyp.  Minimal bleeding since her endometrial biopsy.   She feels really good on her daily hormonal therapy.  Had some hot flashes this week.   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 Rhodia Albright.   Review of Systems  Constitutional: Negative.   HENT: Negative.   Eyes: Negative.   Respiratory: Negative.   Cardiovascular: Negative.   Gastrointestinal: Negative.   Endocrine: Negative.   Genitourinary: Negative.   Musculoskeletal: Negative.   Skin: Negative.   Allergic/Immunologic: Negative.   Neurological: Negative.   Psychiatric/Behavioral: Negative.     LMP: 01-06-2016 Contraception post menopausal HRT-climara patch, prometrium Took 4 tylenol at 9am.     Objective:   Physical Exam Genitourinary:      Colposcopy - cervix, vagina. Consent for procedure.  3% acetic acid used in vagina. White light and green light filter used.  Colposcopy satisfactory:  Yes   __x___          No    _____ Findings:    Cervix:  Small transformation zone.  Satisfactory colposcopy.   Thin rim of acetowhite change at 3:00.  Appearance of small endocervical polyp.   Vagina: Biopsies:  ECC and 3:00, sent to pathology separately.  Lugol's placed.  No additional lesions noted. Monsel's placed.  Minimal EBL. No complications.      Assessment:       AGUS pap.  Postmenopausal bleeding.  Cervical polyp.  Endometrial polyp. 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     Plan:     We discussed the above issues.  Will follow up colposcopic biopsies.  Discussion of hysteroscopy with Myosure polypectomy, dilation and  curettage.  Risks, benefits, and alternatives reviewed. Risks include but are not limited to bleeding, infection, damage to surrounding organs including uterine perforation requiring hospitalization and laparoscopy, pulmonary edema, reaction to anesthesia, DVT, PE, need for further treatment and surgery. Will need to see results of colposcopic biopsies to determine her full surgical needs, including removal of cervical polyp. Questions invited and answered.  ___15____ minutes face to face time of which over 50% was spent in counseling.   After visit summary to patient.

## 2019-01-06 NOTE — Patient Instructions (Signed)

## 2019-01-08 DIAGNOSIS — N840 Polyp of corpus uteri: Secondary | ICD-10-CM | POA: Insufficient documentation

## 2019-01-08 DIAGNOSIS — R87619 Unspecified abnormal cytological findings in specimens from cervix uteri: Secondary | ICD-10-CM | POA: Insufficient documentation

## 2019-01-11 ENCOUNTER — Telehealth: Payer: Self-pay | Admitting: *Deleted

## 2019-01-11 NOTE — Telephone Encounter (Signed)
Patient returning Sally's call.

## 2019-01-11 NOTE — Telephone Encounter (Signed)
Call to patient. No answer at home number. Cell number voice mail has name confirmation.  Left message to call back. No emergency.

## 2019-01-11 NOTE — Telephone Encounter (Signed)
-----   Message from Nunzio Cobbs, MD sent at 01/11/2019 11:38 AM EDT ----- Please report results of colposcopic biopsies which are normal.  She had an AGUS pap which preceded this.   She also has postmenopausal bleeding and both an endometrial and endocervical polyp.  She will need a hysteroscopic polypectomy with Myosure and dilation and curettage when we are able to start re-entry of surgical patients during the Covid 19 epidemic.  She is aware of this.   I do consider her a priority surgical patient. She will need to quarantine for 14 days prior to her surgery date and she will need to complete Covid 19 testing also.  This is a hospital protocol that is in place to keep patients and others safe.

## 2019-01-16 ENCOUNTER — Other Ambulatory Visit: Payer: Self-pay | Admitting: Obstetrics and Gynecology

## 2019-01-16 DIAGNOSIS — N959 Unspecified menopausal and perimenopausal disorder: Secondary | ICD-10-CM

## 2019-01-16 NOTE — Telephone Encounter (Signed)
Medication refill request: prometrium 100mg  Last AEX:  12-26-2018 AGUS pap Next AEX: not scheduled Last MMG (if hormonal medication request): 04-29-18 category c density birads 1:neg Refill authorized: please approve if appropriate

## 2019-01-17 ENCOUNTER — Other Ambulatory Visit: Payer: Self-pay | Admitting: Obstetrics and Gynecology

## 2019-01-17 NOTE — Telephone Encounter (Signed)
Call to patient. Reviewed results as directed by Dr Quincy Simmonds. Reviewed plans to proceed with surgery. Advised that hospital is working to resume elective procedures soon. Will call back as soon as date available.   Encounter closed.

## 2019-01-18 ENCOUNTER — Encounter (HOSPITAL_BASED_OUTPATIENT_CLINIC_OR_DEPARTMENT_OTHER): Payer: Self-pay

## 2019-01-18 ENCOUNTER — Other Ambulatory Visit: Payer: Self-pay

## 2019-01-18 ENCOUNTER — Telehealth: Payer: Self-pay | Admitting: Obstetrics and Gynecology

## 2019-01-18 MED ORDER — LACTATED RINGERS IV SOLN
INTRAVENOUS | Status: DC
  Filled 2019-01-18: qty 1000

## 2019-01-18 NOTE — Progress Notes (Signed)
SPOKE W/  Jadae     SCREENING SYMPTOMS OF COVID 19:   COUGH--NO  RUNNY NOSE--- NO  SORE THROAT---NO  NASAL CONGESTION----NO  SNEEZING----NO  SHORTNESS OF BREATH---  DIFFICULTY BREATHING---  TEMP >100.0 -----NO  UNEXPLAINED BODY ACHES------NO  CHILLS -------- NO  HEADACHES ---------NO  LOSS OF SMELL/ TASTE --------NO    HAVE YOU OR ANY FAMILY MEMBER TRAVELLED PAST 14 DAYS OUT OF THE   COUNTY---traveled to [place] Saturday 5/2 STATE----NO COUNTRY----NO  HAVE YOU OR ANY FAMILY MEMBER BEEN EXPOSED TO ANYONE WITH COVID 19? NO

## 2019-01-18 NOTE — Telephone Encounter (Signed)
Patient returning Suzy's call.

## 2019-01-18 NOTE — Progress Notes (Signed)
Spoke with: Elzie Rings NPO:  No food after midnight/Clear liquids until 4:30AM DOS Arrival time: 0530AM Labs: COVID (CBC 01/19/2019 in epic) AM medications: Flonase/nasal spray, Bupropion, Eyedrops, Xanax if needed Pre op orders: Yes Ride home: [place] [phone]

## 2019-01-18 NOTE — Telephone Encounter (Signed)
Patient returning Brandi Carpenter's call.

## 2019-01-18 NOTE — Telephone Encounter (Signed)
Call to patient. Advised surgery scheduled for 01-23-19 at 0730 at [facility].  Surgery instruction sheet reviewed and printed copy will be provided at appointment tomorrow.   Patient advised to expect call from surgery center with additional information regarding guidelines related to Covid 19.    Insurance benefits provided.   Routing to Dr Quincy Simmonds. Encounter closed.

## 2019-01-18 NOTE — Telephone Encounter (Signed)
Call placed to review benefits for scheduled surgery. Left voicemail message requesting a return call   cc: Lamont Snowball, RN

## 2019-01-19 ENCOUNTER — Other Ambulatory Visit (HOSPITAL_COMMUNITY)
Admission: RE | Admit: 2019-01-19 | Discharge: 2019-01-19 | Disposition: A | Discharge status: Home / Self Care | Payer: BC Managed Care – PPO | Source: Ambulatory Visit | Attending: Obstetrics and Gynecology | Admitting: Obstetrics and Gynecology

## 2019-01-19 ENCOUNTER — Encounter: Payer: Self-pay | Admitting: Obstetrics and Gynecology

## 2019-01-19 ENCOUNTER — Encounter (HOSPITAL_COMMUNITY)
Admission: RE | Admit: 2019-01-19 | Discharge: 2019-01-19 | Disposition: A | Discharge status: Home / Self Care | Payer: BC Managed Care – PPO | Source: Ambulatory Visit | Attending: Obstetrics and Gynecology | Admitting: Obstetrics and Gynecology

## 2019-01-19 ENCOUNTER — Ambulatory Visit (INDEPENDENT_AMBULATORY_CARE_PROVIDER_SITE_OTHER): Payer: BC Managed Care – PPO | Admitting: Obstetrics and Gynecology

## 2019-01-19 VITALS — BP 112/70 | HR 80 | Temp 98.1°F | Ht 64.0 in | Wt 186.0 lb

## 2019-01-19 DIAGNOSIS — N840 Polyp of corpus uteri: Secondary | ICD-10-CM

## 2019-01-19 DIAGNOSIS — Z1159 Encounter for screening for other viral diseases: Secondary | ICD-10-CM | POA: Diagnosis not present

## 2019-01-19 DIAGNOSIS — R87619 Unspecified abnormal cytological findings in specimens from cervix uteri: Secondary | ICD-10-CM

## 2019-01-19 DIAGNOSIS — Z01812 Encounter for preprocedural laboratory examination: Secondary | ICD-10-CM | POA: Diagnosis present

## 2019-01-19 DIAGNOSIS — N950 Postmenopausal bleeding: Secondary | ICD-10-CM

## 2019-01-19 LAB — CBC
HCT: 39.9 % (ref 36.0–46.0)
Hemoglobin: 13.3 g/dL (ref 12.0–15.0)
MCH: 31.1 pg (ref 26.0–34.0)
MCHC: 33.3 g/dL (ref 30.0–36.0)
MCV: 93.4 fL (ref 80.0–100.0)
Platelets: 263 10*3/uL (ref 150–400)
RBC: 4.27 MIL/uL (ref 3.87–5.11)
RDW: 12 % (ref 11.5–15.5)
WBC: 6.4 10*3/uL (ref 4.0–10.5)
nRBC: 0 % (ref 0.0–0.2)

## 2019-01-19 NOTE — Progress Notes (Signed)
Left message for Brandi Carpenter to self quarantine after COVID SWAB to prevent exposure to COVID prior to surgery.

## 2019-01-19 NOTE — Progress Notes (Signed)
Spoke with patient while in office. Reviewed pre-op instructions and confirmed post-op appt. Copy provided to patient. Patient verbalizes understanding and is agreeable.

## 2019-01-19 NOTE — Progress Notes (Signed)
GYNECOLOGY  VISIT   HPI: 53 y.o.   Domestic Partner  Caucasian  female   [place] with Patient's last menstrual period was 01/06/2016.   here for pre op for 01/23/19 Dilatation and Curettage/ Hysteroscopy with Myosure.   The patient has postmenopausal bleeding, a pelvic ultrasound/sonohysterogram showing a 22 x 12 mm endometrial and potential endocervical mass, endometrial biopsy showing a benign polyp, an AGUS pap with a negative colposcopic biopsy and ECC.   Patient is on HRT - Climara patch 0.05 mg twice weekly and Prometrium 100 mg daily.   GYNECOLOGIC HISTORY: Patient's last menstrual period was 01/06/2016. Contraception:  PMP Menopausal hormone therapy:  Climara patch and Progesterone cap  Last mammogram:  04/29/18 BIRADS1:Neg Last pap smear:   12/26/18 Atypical Glandular cells. HR HPV:Neg        OB History    Gravida  0   Para  0   Term  0   Preterm  0   AB  0   Living  0     SAB  0   TAB  0   Ectopic  0   Multiple  0   Live Births                 Patient Active Problem List   Diagnosis Date Noted  . Atypical glandular cells of undetermined significance (AGUS) on cervical Pap smear 01/08/2019  . Endometrial polyp 01/08/2019  . Postmenopausal bleeding 12/26/2018  . Lateral epicondylitis of right elbow 07/12/2018  . Lumbar radiculopathy, right 07/12/2018  . Piriformis syndrome of right side 06/08/2018  . Status post cervical spinal fusion 01/27/2016  . Headache 05/13/2015  . Blurry vision 05/13/2015  . Perceived hearing changes 05/13/2015  . Migraine headache 04/29/2015  . Raynaud phenomenon 08/28/2013  . Depression, major, in remission ([place]) 08/28/2013  . Allergic conjunctivitis 05/09/2013  . Anxiety 03/09/2012  . Insomnia 03/09/2012  . Allergic rhinitis 03/09/2012    Past Medical History:  Diagnosis Date  . Abnormal Pap smear    ASCUS 08/1999; CIN 11/1999, s/p conization, 12-26-2018 AGUS  . Allergic rhinitis, cause unspecified   . Anxiety   .  Complication of anesthesia    "difficulty awakening" remote past  . DDD (degenerative disc disease), cervical 05/05/2018   Noted on imaging  . DDD (degenerative disc disease), lumbar 06/08/2018   Noted on imaging  . Depression   . Endometrial polyp   . Headache   . Insomnia   . PMB (postmenopausal bleeding)   . 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 Stenosis, cervical spine 05/05/2018   Noted on imaging  . Vitamin D deficiency     Past Surgical History:  Procedure Laterality Date  . ANKLE SURGERY  child   left for torn ligaments, left  . ANTERIOR CERVICAL DECOMP/DISCECTOMY FUSION N/A 01/27/2016   Procedure: C5-6, C6-7 Anterior Cervical Discectomy and Fusion, Allograft, Plate;  Surgeon: Marybelle Killings, MD;  Location: [place];  Service: Orthopedics;  Laterality: N/A;  . CERVICAL CONIZATION W/BX  2001   Laser conization - Dr. Rhodia Albright - [facility] - Moderate to severe dysplasia, negative margins  . COLONOSCOPY  09/2015  . [place] SURGERY  10/2018  . NASAL SEPTOPLASTY W/ TURBINOPLASTY Bilateral 10/07/2015   Procedure: NASAL SEPTOPLASTY WITH BILATERAL TURBINATE REDUCTION;  Surgeon: Leta Baptist, MD;  Location: [place];  Service: ENT;  Laterality: Bilateral;  . PILONIDAL CYST EXCISION    . WISDOM TOOTH EXTRACTION  Current Outpatient Medications  Medication Sig Dispense Refill  . ALPRAZolam (XANAX) 0.5 MG tablet TAKE 1/2 TO 1 TABLET BY MOUTH THREE TIMES DAILY AS NEEDED FOR ANXIETY 20 tablet 0  . azelastine (ASTELIN) 0.1 % nasal spray 2 (two) times daily.     Marland Kitchen buPROPion (WELLBUTRIN XL) 300 MG 24 hr tablet TAKE 1 TABLET BY MOUTH EVERY MORNING 90 tablet 3  . Cholecalciferol (VITAMIN D) 2000 UNITS tablet Take 2,000 Units by mouth daily.    . citalopram (CELEXA) 20 MG tablet TAKE 1 TABLET BY MOUTH ONCE DAILY (Patient taking differently: every evening. take 1 tablet by mouth once daily) 90 tablet 3  . cyclobenzaprine (FLEXERIL) 10 MG tablet as needed.      Marland Kitchen estradiol (CLIMARA - DOSED IN MG/24 HR) 0.05 mg/24hr patch APPLY 1 PATCH ONTO THE SKIN ONCE A WEEK 4 patch 5  . fluticasone (FLONASE) 50 MCG/ACT nasal spray INSTILL 2 SPRAYS INTO EACH NOSTRIL twice a day  0  . gabapentin (NEURONTIN) 100 MG capsule Take 2 capsules (200 mg total) by mouth at bedtime. 180 capsule 1  . guaiFENesin (MUCINEX) 600 MG 12 hr tablet Take 1,200 mg by mouth 2 (two) times daily.    Marland Kitchen HYDROcodone-acetaminophen (NORCO/VICODIN) 5-325 MG tablet Take 1 tablet by mouth every 6 (six) hours as needed.    Marland Kitchen levocetirizine (XYZAL) 5 MG tablet TAKE 1 TABLET BY MOUTH EVERY EVENING 30 tablet 8  . meloxicam (MOBIC) 15 MG tablet Take 15 mg by mouth every evening.    Marland Kitchen olopatadine (PATANOL) 0.1 % ophthalmic solution place 1 drop into both eyes twice a day (Patient taking differently: as needed. place 1 drop into both eyes twice a day) 5 mL 5  . progesterone (PROMETRIUM) 100 MG capsule TAKE 1 CAPSULE(100 MG) BY MOUTH DAILY (Patient taking differently: every evening. TAKE 1 CAPSULE(100 MG) BY MOUTH DAILY) 30 capsule 0  . zolpidem (AMBIEN) 10 MG tablet TAKE 1/2 TO 1 TABLET(5 TO 10 MG) BY MOUTH AT BEDTIME AS NEEDED FOR SLEEP 30 tablet 1  . EPIPEN 2-PAK 0.3 MG/0.3ML SOAJ injection Reported on 02/17/2016  0   No current facility-administered medications for this visit.      ALLERGIES: Codeine and Pamelor [nortriptyline]  Family History  Problem Relation Age of Onset  . Hypertension Mother   . Diabetes Mother   . Heart disease Father   . Depression Sister   . Breast cancer Cousin   . Colon cancer Maternal Grandmother         died at 22  . Diabetes Maternal Grandmother   . Alzheimer's disease Paternal Grandmother   . Colon polyps Maternal Uncle   . Migraines Neg Hx     Social History   Socioeconomic History  . Marital status: Soil scientist    Spouse name: Not on file  . Number of children: 0  . Years of education: 49  . Highest education level: Not on file  Occupational  History  . Occupation: Naval architect: [employer]  Social Needs  . Financial resource strain: Not on file  . Food insecurity:    Worry: Not on file    Inability: Not on file  . Transportation needs:    Medical: Not on file    Non-medical: Not on file  Tobacco Use  . Smoking status: Never Smoker  . Smokeless tobacco: Never Used  Substance and Sexual Activity  . Alcohol use: No    Alcohol/week: 0.0 standard drinks  .  Drug use: Never  . Sexual activity: Not Currently    Partners: Female    Birth control/protection: Abstinence, Post-menopausal  Lifestyle  . Physical activity:    Days per week: Not on file    Minutes per session: Not on file  . Stress: Not on file  Relationships  . Social connections:    Talks on phone: Not on file    Gets together: Not on file    Attends religious service: Not on file    Active member of club or organization: Not on file    Attends meetings of clubs or organizations: Not on file    Relationship status: Not on file  . Intimate partner violence:    Fear of current or ex partner: Not on file    Emotionally abused: Not on file    Physically abused: Not on file    Forced sexual activity: Not on file  Other Topics Concern  . Not on file  Social History Narrative   Lives with female partner Juliann Pulse), 2 beagles    Review of Systems  All other systems reviewed and are negative.   PHYSICAL EXAMINATION:    BP 112/70   Pulse 80   Temp 98.1 F (36.7 C) (Oral)   Ht 5\' 4"  (1.626 m)   Wt 186 lb (84.4 kg)   LMP 01/06/2016   BMI 31.93 kg/m     General appearance: alert, cooperative and appears stated age Head: Normocephalic, without obvious abnormality, atraumatic Neck: no adenopathy, supple, symmetrical, trachea midline and thyroid normal to inspection and palpation Lungs: clear to auscultation bilaterally  Heart: regular rate and rhythm Abdomen: soft, non-tender, no masses,  no organomegaly Extremities: extremities normal,  atraumatic, no cyanosis or edema Skin: Skin color, texture, turgor normal. No rashes or lesions No abnormal inguinal nodes palpated Neurologic: Grossly normal  Pelvic: External genitalia:  no lesions              Urethra:  normal appearing urethra with no masses, tenderness or lesions              Bartholins and Skenes: normal                 Vagina: normal appearing vagina with normal color and discharge, no lesions              Cervix: no lesions                Bimanual Exam:  Uterus:  normal size, contour, position, consistency, mobility, non-tender              Adnexa: no mass, fullness, tenderness            Chaperone was present for exam.  ASSESSMENT   Postmenopausal bleeding.  Endometrial polyp. Possible endocervical polyp.  AGUS pap and negative colposcopic cervical and endocervical sampling.  HRT.  PLAN  Discussion of hysteroscopy with Myosure polypectomy, dilation and curettage.  Risks, benefits, and alternatives reviewed. Risks include but are not limited to bleeding, infection, damage to surrounding organs including uterine perforation requiring hospitalization and laparoscopy, pulmonary edema, reaction to anesthesia, DVT, PE, death, need for further treatment and surgery including repeat hysteroscopy, hysterectomy or medical therapy.   Surgical expectations and recovery discussed.  Patient wishes to proceed. Ok to continue HRT.  Ok for Meloxicam after surgery.    An After Visit Summary was printed and given to the patient.

## 2019-01-20 LAB — NOVEL CORONAVIRUS, NAA (HOSP ORDER, SEND-OUT TO REF LAB; TAT 18-24 HRS): SARS-CoV-2, NAA: NOT DETECTED

## 2019-01-22 NOTE — H&P (Signed)
Office Visit   01/19/2019 Brandi Carpenter All, MD  Obstetrics and Gynecology   Postmenopausal bleeding +2 more  Dx   Pre-op Exam   ; Referred by Brandi Ohara, MD  Reason for Visit   Additional Documentation   Vitals:    BP 112/70   Pulse 80   Temp 98.1 F (36.7 C) (Oral)   Ht 5\' 4"  (1.626 m)   Wt 84.4 kg   LMP 01/06/2016   BMI 31.93 kg/m   BSA 1.95 m     More Vitals   Flowsheets:    MEWS Score,   Anthropometrics,   Method of Visit     Encounter Info:    Billing Info,   History,   Allergies,   Detailed Report     All Notes    Progress Notes by Brandi Logan, RN at 01/19/2019 11:00 AM  Author: Burnice Logan, RN Author Type: Registered Nurse Filed: 01/22/2019  7:53 PM  Note Status: Signed Cosign: Cosign Not Required Encounter Date: 01/19/2019  Editor: Brandi Logan, RN (Registered Nurse)    Spoke with patient while in office. Reviewed pre-op instructions and confirmed post-op appt. Copy provided to patient. Patient verbalizes understanding and is agreeable.      Progress Notes by Brandi Cobbs, MD at 01/19/2019 11:00 AM  Author: Nunzio Cobbs, MD Author Type: Physician Filed: 01/22/2019  7:53 PM  Note Status: Signed Cosign: Cosign Not Required Encounter Date: 01/19/2019  Editor: Brandi Cobbs, MD (Physician)  Prior Versions: 1. Brandi Carpenter, CMA (Physiological scientist) at 01/19/2019 11:11 AM - Sign when Signing Visit    GYNECOLOGY  VISIT   HPI: 53 y.o.   Domestic Partner  Caucasian  female   [place] with Patient's last menstrual period was 01/06/2016.   here for pre op for 01/23/19 Dilatation and Curettage/ Hysteroscopy with Myosure.    The patient has postmenopausal bleeding, a pelvic ultrasound/sonohysterogram showing a 22 x 12 mm endometrial and potential endocervical mass, endometrial biopsy showing a benign polyp, an AGUS pap with a negative colposcopic biopsy and ECC.     Patient is on HRT - Climara patch 0.05 mg twice weekly and Prometrium 100 mg daily.    GYNECOLOGIC HISTORY: Patient's last menstrual period was 01/06/2016. Contraception:  PMP Menopausal hormone therapy:  Climara patch and Progesterone cap  Last mammogram:  04/29/18 BIRADS1:Neg Last pap smear:   12/26/18 Atypical Glandular cells. HR HPV:Neg                OB History     Gravida  0   Para  0   Term  0   Preterm  0   AB  0   Living  0      SAB  0   TAB  0   Ectopic  0   Multiple  0   Live Births                        Patient Active Problem List    Diagnosis Date Noted  . Atypical glandular cells of undetermined significance (AGUS) on cervical Pap smear 01/08/2019  . Endometrial polyp 01/08/2019  . Postmenopausal bleeding 12/26/2018  . Lateral epicondylitis of right elbow 07/12/2018  . Lumbar radiculopathy, right 07/12/2018  . Piriformis syndrome of right side 06/08/2018  . Status post cervical spinal fusion 01/27/2016  . Headache  05/13/2015  . Blurry vision 05/13/2015  . Perceived hearing changes 05/13/2015  . Migraine headache 04/29/2015  . Raynaud phenomenon 08/28/2013  . Depression, major, in remission ([place]) 08/28/2013  . Allergic conjunctivitis 05/09/2013  . Anxiety 03/09/2012  . Insomnia 03/09/2012  . Allergic rhinitis 03/09/2012          Past Medical History:  Diagnosis Date  . Abnormal Pap smear      ASCUS 08/1999; CIN 11/1999, s/p conization, 12-26-2018 AGUS  . Allergic rhinitis, cause unspecified    . Anxiety    . Complication of anesthesia      "difficulty awakening" remote past  . DDD (degenerative disc disease), cervical 05/05/2018    Noted on imaging  . DDD (degenerative disc disease), lumbar 06/08/2018    Noted on imaging  . Depression    . Endometrial polyp    . Headache    . Insomnia    . PMB (postmenopausal bleeding)    . 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 Stenosis, cervical spine 05/05/2018     Noted on imaging  . Vitamin D deficiency             Past Surgical History:  Procedure Laterality Date  . ANKLE SURGERY   child    left for torn ligaments, left  . ANTERIOR CERVICAL DECOMP/DISCECTOMY FUSION N/A 01/27/2016    Procedure: C5-6, C6-7 Anterior Cervical Discectomy and Fusion, Allograft, Plate;  Surgeon: Marybelle Killings, MD;  Location: [facility] [facility];  Service: Orthopedics;  Laterality: N/A;  . CERVICAL CONIZATION W/BX   2001    Laser conization - Dr. Rhodia Albright - [facility] - Moderate to severe dysplasia, negative margins  . COLONOSCOPY   09/2015  . [place] SURGERY   10/2018  . NASAL SEPTOPLASTY W/ TURBINOPLASTY Bilateral 10/07/2015    Procedure: NASAL SEPTOPLASTY WITH BILATERAL TURBINATE REDUCTION;  Surgeon: Leta Baptist, MD;  Location: [facility];  Service: ENT;  Laterality: Bilateral;  . PILONIDAL CYST EXCISION      . WISDOM TOOTH EXTRACTION                Current Outpatient Medications  Medication Sig Dispense Refill  . ALPRAZolam (XANAX) 0.5 MG tablet TAKE 1/2 TO 1 TABLET BY MOUTH THREE TIMES DAILY AS NEEDED FOR ANXIETY 20 tablet 0  . azelastine (ASTELIN) 0.1 % nasal spray 2 (two) times daily.       Marland Kitchen buPROPion (WELLBUTRIN XL) 300 MG 24 hr tablet TAKE 1 TABLET BY MOUTH EVERY MORNING 90 tablet 3  . Cholecalciferol (VITAMIN D) 2000 UNITS tablet Take 2,000 Units by mouth daily.      . citalopram (CELEXA) 20 MG tablet TAKE 1 TABLET BY MOUTH ONCE DAILY (Patient taking differently: every evening. take 1 tablet by mouth once daily) 90 tablet 3  . cyclobenzaprine (FLEXERIL) 10 MG tablet as needed.       Marland Kitchen estradiol (CLIMARA - DOSED IN MG/24 HR) 0.05 mg/24hr patch APPLY 1 PATCH ONTO THE SKIN ONCE A WEEK 4 patch 5  . fluticasone (FLONASE) 50 MCG/ACT nasal spray INSTILL 2 SPRAYS INTO EACH NOSTRIL twice a day   0  . gabapentin (NEURONTIN) 100 MG capsule Take 2 capsules (200 mg total) by mouth at bedtime. 180 capsule 1  . guaiFENesin (MUCINEX) 600 MG 12 hr tablet Take 1,200 mg by  mouth 2 (two) times daily.      Marland Kitchen HYDROcodone-acetaminophen (NORCO/VICODIN) 5-325 MG tablet Take 1 tablet by mouth every 6 (  six) hours as needed.      Marland Kitchen levocetirizine (XYZAL) 5 MG tablet TAKE 1 TABLET BY MOUTH EVERY EVENING 30 tablet 8  . meloxicam (MOBIC) 15 MG tablet Take 15 mg by mouth every evening.      Marland Kitchen olopatadine (PATANOL) 0.1 % ophthalmic solution place 1 drop into both eyes twice a day (Patient taking differently: as needed. place 1 drop into both eyes twice a day) 5 mL 5  . progesterone (PROMETRIUM) 100 MG capsule TAKE 1 CAPSULE(100 MG) BY MOUTH DAILY (Patient taking differently: every evening. TAKE 1 CAPSULE(100 MG) BY MOUTH DAILY) 30 capsule 0  . zolpidem (AMBIEN) 10 MG tablet TAKE 1/2 TO 1 TABLET(5 TO 10 MG) BY MOUTH AT BEDTIME AS NEEDED FOR SLEEP 30 tablet 1  . EPIPEN 2-PAK 0.3 MG/0.3ML SOAJ injection Reported on 02/17/2016   0    No current facility-administered medications for this visit.       ALLERGIES: Codeine and Pamelor [nortriptyline]        Family History  Problem Relation Age of Onset  . Hypertension Mother    . Diabetes Mother    . Heart disease Father    . Depression Sister    . Breast cancer Cousin    . Colon cancer Maternal Grandmother           died at 22  . Diabetes Maternal Grandmother    . Alzheimer's disease Paternal Grandmother    . Colon polyps Maternal Uncle    . Migraines Neg Hx        Social History         Socioeconomic History  . Marital status: Soil scientist      Spouse name: Not on file  . Number of children: 0  . Years of education: 19  . Highest education level: Not on file  Occupational History  . Occupation: Engineer, water: [employer]  Social Needs  . Financial resource strain: Not on file  . Food insecurity:      Worry: Not on file      Inability: Not on file  . Transportation needs:      Medical: Not on file      Non-medical: Not on file  Tobacco Use  . Smoking status: Never Smoker  . Smokeless  tobacco: Never Used  Substance and Sexual Activity  . Alcohol use: No      Alcohol/week: 0.0 standard drinks  . Drug use: Never  . Sexual activity: Not Currently      Partners: Female      Birth control/protection: Abstinence, Post-menopausal  Lifestyle  . Physical activity:      Days per week: Not on file      Minutes per session: Not on file  . Stress: Not on file  Relationships  . Social connections:      Talks on phone: Not on file      Gets together: Not on file      Attends religious service: Not on file      Active member of club or organization: Not on file      Attends meetings of clubs or organizations: Not on file      Relationship status: Not on file  . Intimate partner violence:      Fear of current or ex partner: Not on file      Emotionally abused: Not on file      Physically abused: Not on file  Forced sexual activity: Not on file  Other Topics Concern  . Not on file  Social History Narrative    Lives with female partner Juliann Pulse), 2 beagles      Review of Systems  All other systems reviewed and are negative.     PHYSICAL EXAMINATION:     BP 112/70   Pulse 80   Temp 98.1 F (36.7 C) (Oral)   Ht 5\' 4"  (1.626 m)   Wt 186 lb (84.4 kg)   LMP 01/06/2016   BMI 31.93 kg/m     General appearance: alert, cooperative and appears stated age Head: Normocephalic, without obvious abnormality, atraumatic Neck: no adenopathy, supple, symmetrical, trachea midline and thyroid normal to inspection and palpation Lungs: clear to auscultation bilaterally  Heart: regular rate and rhythm Abdomen: soft, non-tender, no masses,  no organomegaly Extremities: extremities normal, atraumatic, no cyanosis or edema Skin: Skin color, texture, turgor normal. No rashes or lesions No abnormal inguinal nodes palpated Neurologic: Grossly normal   Pelvic: External genitalia:  no lesions              Urethra:  normal appearing urethra with no masses, tenderness or lesions               Bartholins and Skenes: normal                 Vagina: normal appearing vagina with normal color and discharge, no lesions              Cervix: no lesions                Bimanual Exam:  Uterus:  normal size, contour, position, consistency, mobility, non-tender              Adnexa: no mass, fullness, tenderness            Chaperone was present for exam.   ASSESSMENT   Postmenopausal bleeding.  Endometrial polyp. Possible endocervical polyp.  AGUS pap and negative colposcopic cervical and endocervical sampling.  HRT.   PLAN   Discussion of hysteroscopy with Myosure polypectomy, dilation and curettage.  Risks, benefits, and alternatives reviewed. Risks include but are not limited to bleeding, infection, damage to surrounding organs including uterine perforation requiring hospitalization and laparoscopy, pulmonary edema, reaction to anesthesia, DVT, PE, death, need for further treatment and surgery including repeat hysteroscopy, hysterectomy or medical therapy.   Surgical expectations and recovery discussed.  Patient wishes to proceed. Ok to continue HRT.  Ok for Meloxicam after surgery.    An After Visit Summary was printed and given to the patient.

## 2019-01-22 NOTE — Anesthesia Preprocedure Evaluation (Addendum)
Anesthesia Evaluation  Patient identified by MRN, date of birth, ID band Patient awake    Reviewed: Allergy & Precautions, NPO status , Patient's Chart, lab work & pertinent test results  Airway Mallampati: II  TM Distance: >3 FB Neck ROM: Full    Dental no notable dental hx. (+) Teeth Intact, Dental Advisory Given   Pulmonary    Pulmonary exam normal breath sounds clear to auscultation       Cardiovascular Exercise Tolerance: Good negative cardio ROS Normal cardiovascular exam Rhythm:Regular Rate:Normal     Neuro/Psych  Headaches, Depression    GI/Hepatic negative GI ROS, Neg liver ROS,   Endo/Other  negative endocrine ROS  Renal/GU negative Renal ROS     Musculoskeletal  (+) Arthritis ,   Abdominal   Peds  Hematology Hgb 13.3   Anesthesia Other Findings All: Nortriptyline  Reproductive/Obstetrics                            Anesthesia Physical Anesthesia Plan  ASA: II  Anesthesia Plan: General   Post-op Pain Management:    Induction: Intravenous  PONV Risk Score and Plan: 3 and Treatment may vary due to age or medical condition, Ondansetron and Dexamethasone  Airway Management Planned: LMA  Additional Equipment:   Intra-op Plan:   Post-operative Plan:   Informed Consent: I have reviewed the patients History and Physical, chart, labs and discussed the procedure including the risks, benefits and alternatives for the proposed anesthesia with the patient or authorized representative who has indicated his/her understanding and acceptance.     Dental advisory given  Plan Discussed with: CRNA  Anesthesia Plan Comments: (GA w LMA)       Anesthesia Quick Evaluation

## 2019-01-23 ENCOUNTER — Encounter (HOSPITAL_BASED_OUTPATIENT_CLINIC_OR_DEPARTMENT_OTHER)
Admission: RE | Disposition: A | Discharge status: Home / Self Care | Payer: Self-pay | Source: Home / Self Care | Attending: Obstetrics and Gynecology

## 2019-01-23 ENCOUNTER — Encounter (HOSPITAL_BASED_OUTPATIENT_CLINIC_OR_DEPARTMENT_OTHER): Payer: Self-pay

## 2019-01-23 ENCOUNTER — Ambulatory Visit (HOSPITAL_BASED_OUTPATIENT_CLINIC_OR_DEPARTMENT_OTHER): Payer: BC Managed Care – PPO | Admitting: Physician Assistant

## 2019-01-23 ENCOUNTER — Ambulatory Visit (HOSPITAL_BASED_OUTPATIENT_CLINIC_OR_DEPARTMENT_OTHER): Payer: BC Managed Care – PPO | Admitting: Anesthesiology

## 2019-01-23 ENCOUNTER — Ambulatory Visit (HOSPITAL_BASED_OUTPATIENT_CLINIC_OR_DEPARTMENT_OTHER)
Admission: RE | Admit: 2019-01-23 | Discharge: 2019-01-23 | Disposition: A | Discharge status: Home / Self Care | Payer: BC Managed Care – PPO | Attending: Obstetrics and Gynecology | Admitting: Obstetrics and Gynecology

## 2019-01-23 DIAGNOSIS — Z79899 Other long term (current) drug therapy: Secondary | ICD-10-CM | POA: Insufficient documentation

## 2019-01-23 DIAGNOSIS — R87619 Unspecified abnormal cytological findings in specimens from cervix uteri: Secondary | ICD-10-CM | POA: Diagnosis not present

## 2019-01-23 DIAGNOSIS — N840 Polyp of corpus uteri: Secondary | ICD-10-CM | POA: Diagnosis not present

## 2019-01-23 DIAGNOSIS — N841 Polyp of cervix uteri: Secondary | ICD-10-CM | POA: Diagnosis not present

## 2019-01-23 DIAGNOSIS — Z791 Long term (current) use of non-steroidal anti-inflammatories (NSAID): Secondary | ICD-10-CM | POA: Diagnosis not present

## 2019-01-23 DIAGNOSIS — F329 Major depressive disorder, single episode, unspecified: Secondary | ICD-10-CM | POA: Diagnosis not present

## 2019-01-23 DIAGNOSIS — N950 Postmenopausal bleeding: Secondary | ICD-10-CM

## 2019-01-23 DIAGNOSIS — Z79890 Hormone replacement therapy (postmenopausal): Secondary | ICD-10-CM | POA: Insufficient documentation

## 2019-01-23 DIAGNOSIS — F419 Anxiety disorder, unspecified: Secondary | ICD-10-CM | POA: Diagnosis not present

## 2019-01-23 DIAGNOSIS — E559 Vitamin D deficiency, unspecified: Secondary | ICD-10-CM | POA: Insufficient documentation

## 2019-01-23 DIAGNOSIS — Z981 Arthrodesis status: Secondary | ICD-10-CM | POA: Insufficient documentation

## 2019-01-23 DIAGNOSIS — R87618 Other abnormal cytological findings on specimens from cervix uteri: Secondary | ICD-10-CM | POA: Diagnosis not present

## 2019-01-23 DIAGNOSIS — Z7951 Long term (current) use of inhaled steroids: Secondary | ICD-10-CM | POA: Diagnosis not present

## 2019-01-23 HISTORY — DX: Other intervertebral disc degeneration, lumbar region: M51.36

## 2019-01-23 HISTORY — DX: Spinal stenosis, cervical region: M48.02

## 2019-01-23 HISTORY — DX: Polyp of corpus uteri: N84.0

## 2019-01-23 HISTORY — DX: Spondylolysis, lumbar region: M43.06

## 2019-01-23 HISTORY — PX: DILATATION & CURETTAGE/HYSTEROSCOPY WITH MYOSURE: SHX6511

## 2019-01-23 HISTORY — DX: Other cervical disc degeneration, unspecified cervical region: M50.30

## 2019-01-23 HISTORY — DX: Postmenopausal bleeding: N95.0

## 2019-01-23 SURGERY — DILATATION & CURETTAGE/HYSTEROSCOPY WITH MYOSURE
Anesthesia: General | Site: Cervix

## 2019-01-23 MED ORDER — ONDANSETRON HCL 4 MG/2ML IJ SOLN
INTRAMUSCULAR | Status: DC | PRN
  Administered 2019-01-23: 4 mg via INTRAVENOUS

## 2019-01-23 MED ORDER — KETOROLAC TROMETHAMINE 30 MG/ML IJ SOLN
INTRAMUSCULAR | Status: AC
  Filled 2019-01-23: qty 1

## 2019-01-23 MED ORDER — PROPOFOL 10 MG/ML IV BOLUS
INTRAVENOUS | Status: AC
  Filled 2019-01-23: qty 40

## 2019-01-23 MED ORDER — DEXAMETHASONE SODIUM PHOSPHATE 10 MG/ML IJ SOLN
INTRAMUSCULAR | Status: DC | PRN
  Administered 2019-01-23: 10 mg via INTRAVENOUS

## 2019-01-23 MED ORDER — ACETAMINOPHEN 500 MG PO TABS
1000.0000 mg | ORAL_TABLET | Freq: Once | ORAL | Status: AC
  Administered 2019-01-23: 06:00:00 1000 mg via ORAL
  Filled 2019-01-23: qty 2

## 2019-01-23 MED ORDER — LIDOCAINE HCL 1 % IJ SOLN
INTRAMUSCULAR | Status: DC | PRN
  Administered 2019-01-23: 10 mL

## 2019-01-23 MED ORDER — HYDROCODONE-ACETAMINOPHEN 7.5-325 MG PO TABS
1.0000 | ORAL_TABLET | Freq: Once | ORAL | Status: DC | PRN
  Filled 2019-01-23: qty 1

## 2019-01-23 MED ORDER — PROPOFOL 10 MG/ML IV BOLUS
INTRAVENOUS | Status: DC | PRN
  Administered 2019-01-23: 200 mg via INTRAVENOUS

## 2019-01-23 MED ORDER — FENTANYL CITRATE (PF) 100 MCG/2ML IJ SOLN
25.0000 ug | INTRAMUSCULAR | Status: DC | PRN
  Filled 2019-01-23: qty 1

## 2019-01-23 MED ORDER — ACETAMINOPHEN 500 MG PO TABS
ORAL_TABLET | ORAL | Status: AC
  Filled 2019-01-23: qty 2

## 2019-01-23 MED ORDER — FENTANYL CITRATE (PF) 100 MCG/2ML IJ SOLN
INTRAMUSCULAR | Status: AC
  Filled 2019-01-23: qty 2

## 2019-01-23 MED ORDER — LIDOCAINE 2% (20 MG/ML) 5 ML SYRINGE
INTRAMUSCULAR | Status: AC
  Filled 2019-01-23: qty 5

## 2019-01-23 MED ORDER — KETOROLAC TROMETHAMINE 30 MG/ML IJ SOLN
30.0000 mg | Freq: Once | INTRAMUSCULAR | Status: DC | PRN
  Filled 2019-01-23: qty 1

## 2019-01-23 MED ORDER — FENTANYL CITRATE (PF) 100 MCG/2ML IJ SOLN
INTRAMUSCULAR | Status: DC | PRN
  Administered 2019-01-23 (×2): 50 ug via INTRAVENOUS

## 2019-01-23 MED ORDER — MIDAZOLAM HCL 2 MG/2ML IJ SOLN
INTRAMUSCULAR | Status: AC
  Filled 2019-01-23: qty 2

## 2019-01-23 MED ORDER — ONDANSETRON HCL 4 MG/2ML IJ SOLN
4.0000 mg | Freq: Once | INTRAMUSCULAR | Status: DC | PRN
  Filled 2019-01-23: qty 2

## 2019-01-23 MED ORDER — KETOROLAC TROMETHAMINE 30 MG/ML IJ SOLN
INTRAMUSCULAR | Status: DC | PRN
  Administered 2019-01-23: 30 mg via INTRAVENOUS

## 2019-01-23 MED ORDER — MIDAZOLAM HCL 5 MG/5ML IJ SOLN
INTRAMUSCULAR | Status: DC | PRN
  Administered 2019-01-23: 2 mg via INTRAVENOUS

## 2019-01-23 MED ORDER — LACTATED RINGERS IV SOLN
INTRAVENOUS | Status: DC
  Administered 2019-01-23: 06:00:00 via INTRAVENOUS
  Filled 2019-01-23: qty 1000

## 2019-01-23 MED ORDER — LIDOCAINE 2% (20 MG/ML) 5 ML SYRINGE
INTRAMUSCULAR | Status: DC | PRN
  Administered 2019-01-23: 100 mg via INTRAVENOUS

## 2019-01-23 MED ORDER — ONDANSETRON HCL 4 MG/2ML IJ SOLN
INTRAMUSCULAR | Status: AC
  Filled 2019-01-23: qty 2

## 2019-01-23 MED ORDER — DEXAMETHASONE SODIUM PHOSPHATE 10 MG/ML IJ SOLN
INTRAMUSCULAR | Status: AC
  Filled 2019-01-23: qty 1

## 2019-01-23 SURGICAL SUPPLY — 18 items
CANISTER SUCT 3000ML PPV (MISCELLANEOUS) ×3 IMPLANT
CATH ROBINSON RED A/P 16FR (CATHETERS) ×3 IMPLANT
COVER WAND RF STERILE (DRAPES) ×3 IMPLANT
DEVICE MYOSURE LITE (MISCELLANEOUS) ×2 IMPLANT
DEVICE MYOSURE REACH (MISCELLANEOUS) IMPLANT
DILATOR CANAL MILEX (MISCELLANEOUS) IMPLANT
GAUZE 4X4 16PLY RFD (DISPOSABLE) ×3 IMPLANT
GLOVE BIO SURGEON STRL SZ 6.5 (GLOVE) ×2 IMPLANT
GLOVE BIO SURGEONS STRL SZ 6.5 (GLOVE) ×1
GOWN STRL REUS W/TWL LRG LVL3 (GOWN DISPOSABLE) ×3 IMPLANT
IV NS IRRIG 3000ML ARTHROMATIC (IV SOLUTION) ×3 IMPLANT
KIT PROCEDURE FLUENT (KITS) ×3 IMPLANT
MYOSURE XL FIBROID (MISCELLANEOUS)
PACK VAGINAL MINOR WOMEN LF (CUSTOM PROCEDURE TRAY) ×3 IMPLANT
PAD OB MATERNITY 4.3X12.25 (PERSONAL CARE ITEMS) ×3 IMPLANT
SEAL ROD LENS SCOPE MYOSURE (ABLATOR) ×3 IMPLANT
SYSTEM TISS REMOVAL MYOSURE XL (MISCELLANEOUS) IMPLANT
TOWEL OR 17X26 10 PK STRL BLUE (TOWEL DISPOSABLE) ×4 IMPLANT

## 2019-01-23 NOTE — Op Note (Signed)
OPERATIVE REPORT   PREOPERATIVE DIAGNOSES:   Postmenopausal bleeding, endocervical and endometrial polyps, AGUS pap  POSTOPERATIVE DIAGNOSES:   Postmenopausal bleeding, endocervical and endometrial polyps, AGUS pap  PROCEDURE:  Hysteroscopy with dilation and curettage and Myosure resection of endocervical and endometrial polyps, fractional dilation and curettage.  SURGEON:  Lenard Galloway, MD  ANESTHESIA:  LMA, paracervical block with 10 mL of 1% lidocaine.  IV FLUIDS:   750 cc LR  EBL:  5 cc  URINE OUTPUT:  50 cc  NORMAL SALINE DEFICIT:   329 cc  COMPLICATIONS:  None.  INDICATIONS FOR THE PROCEDURE:     The patient is a 53 year old G60 caucasian female who presented with postmenopausal bleeding on HRT.   She had an AGUS pap with colposcopic biopsies which were benign. Pelvic ultrasound with sonohysterogram revealed both an endocervical and endometrial polyp.  A benign endometrial polyp was confirmed on endometrial biopsy.   A plan is now made to proceed with a hysteroscopy with dilation and curettage and Myosure resection of endocervical and endometrial polyps, after risks, benefits and alternatives were reviewed.  FINDINGS:  Exam under anesthesia revealed a small anteverted, mobile uterus. The uterus was sounded to 7 cm.  Hysteroscopy showed both an endocervical and an endometrial polyp. Endometrial currettings were scanty.   SPECIMENS:   1.  The endocervical and endometrial polyps were sent to pathology together. 2.  Endocervical curettings. 3.  Endometrial curettings.  PROCEDURE IN DETAIL:  The patient was reidentified in the preoperative hold area.  She received TED hose and PAS stockings for DVT prophylaxis.  In the operating room, the patient was placed in the dorsal lithotomy position and then an LMA anesthetic was introduced.  [name] lower abdomen, vagina and perineum were sterilely prepped with Betadine and the  patient's bladder was catheterized of urine.   She was sterilly draped.  An exam under anesthesia was performed.  A speculum was placed inside the vagina and a single-tooth tenaculum was placed on the anterior cervical lip.  A paracervical block was performed with a total of 10 mL of 1% lidocaine plain.  There was evidence of cervical stenosis, and a  false passage was created to the patients right side of the cervix.  The cervix was dilated  to a #21 Pratt dilator.  The MyoSure hysteroscope was then inserted inside the uterine  cavity under the continuous infusion of normal saline solution.  Findings are as noted above.   The MyoSure was used to resect both the endocervical and then endometrial polyp, and This specimen was sent to pathology together.   The hysteroscope was then removed. The cervix was further dilated to a #23 Pratt dilator. The Kevorkian curette was used to  sample the endocervix, and this specimen was sent to pathology.  A sharp curette was introduced  into the uterine cavity and the endometrium was curetted in all 4 quadrants.  A minimal amount of endometrial curettings was obtained.  This specimen was sent to pathology.  The single-tooth tenaculum which had been placed on the anterior cervical lip was removed.  Hemostasis was good, and all of the vaginal instruments  were removed.  The patient was awakened and escorted to the recovery room in stable condition after she was cleansed with Betadine.  There were no complications to the procedure.  All needle, instrument and sponge counts were correct.  Lenard Galloway, MD

## 2019-01-23 NOTE — Progress Notes (Signed)
Update to History and Physical  No marked change in status since office preop visit. No vaginal bleeding today.  Patient examined.  OK to proceed with surgery.

## 2019-01-23 NOTE — Discharge Instructions (Signed)
Hello Brandi Carpenter,   I found and removed the polyp in your cervix and the polyp in your uterine cavity.  I was able to remove them completely.  I also did the curettage of the lining of the uterus.  Everything went well!  [name], MD  Dilation and Curettage or Vacuum Curettage, Care After These instructions give you information about caring for yourself after your procedure. Your doctor may also give you more specific instructions. Call your doctor if you have any problems or questions after your procedure. Follow these instructions at home: Activity  Do not drive or use heavy machinery while taking prescription pain medicine.  For 24 hours after your procedure, avoid driving.  Take short walks often, followed by rest periods. Ask your doctor what activities are safe for you. After one or two days, you may be able to return to your normal activities.  Do not lift anything that is heavier than 10 lb (4.5 kg) until your doctor approves.  For at least 2 weeks, or as long as told by your doctor: ? Do not douche. ? Do not use tampons. ? Do not have sex. General instructions   Take over-the-counter and prescription medicines only as told by your doctor. This is very important if you take blood thinning medicine.  Do not take baths, swim, or use a hot tub until your doctor approves. Take showers instead of baths.  Wear compression stockings as told by your doctor.  It is up to you to get the results of your procedure. Ask your doctor when your results will be ready.  Keep all follow-up visits as told by your doctor. This is important. Contact a doctor if:  You have very bad cramps that get worse or do not get better with medicine.  You have very bad pain in your belly (abdomen).  You cannot drink fluids without throwing up (vomiting).  You get pain in a different part of the area between your belly and thighs (pelvis).  You have bad-smelling discharge from your vagina.  You  have a rash. Get help right away if:  You are bleeding a lot from your vagina. A lot of bleeding means soaking more than one sanitary pad in an hour, for 2 hours in a row.  You have clumps of blood (blood clots) coming from your vagina.  You have a fever or chills.  Your belly feels very tender or hard.  You have chest pain.  You have trouble breathing.  You cough up blood.  You feel dizzy.  You feel light-headed.  You pass out (faint).  You have pain in your neck or shoulder area. Summary  Take short walks often, followed by rest periods. Ask your doctor what activities are safe for you. After one or two days, you may be able to return to your normal activities.  Do not lift anything that is heavier than 10 lb (4.5 kg) until your doctor approves.  Do not take baths, swim, or use a hot tub until your doctor approves. Take showers instead of baths.  Contact your doctor if you have any symptoms of infection, like bad-smelling discharge from your vagina. This information is not intended to replace advice given to you by your health care provider. Make sure you discuss any questions you have with your health care provider. Document Released: 06/09/2008 Document Revised: 05/18/2016 Document Reviewed: 05/18/2016 Elsevier Interactive Patient Education  2019 [place]. Hysteroscopy, Care After This sheet gives you information about how to  care for yourself after your procedure. Your health care provider may also give you more specific instructions. If you have problems or questions, contact your health care provider. What can I expect after the procedure? After the procedure, it is common to have:  Cramping.  Bleeding. This can vary from light spotting to menstrual-like bleeding. Follow these instructions at home: Activity  Rest for 1-2 days after the procedure.  Do not douche, use tampons, or have sex for 2 weeks after the procedure, or until your health care provider  approves.  Do not drive for 24 hours after the procedure, or for as long as told by your health care provider.  Do not drive, use heavy machinery, or drink alcohol while taking prescription pain medicines. Medicines   Take over-the-counter and prescription medicines only as told by your health care provider.  Do not take aspirin during recovery. It can increase the risk of bleeding. General instructions  Do not take baths, swim, or use a hot tub until your health care provider approves. Take showers instead of baths for 2 weeks, or for as long as told by your health care provider.  To prevent or treat constipation while you are taking prescription pain medicine, your health care provider may recommend that you: ? Drink enough fluid to keep your urine clear or pale yellow. ? Take over-the-counter or prescription medicines. ? Eat foods that are high in fiber, such as fresh fruits and vegetables, whole grains, and beans. ? Limit foods that are high in fat and processed sugars, such as fried and sweet foods.  Keep all follow-up visits as told by your health care provider. This is important. Contact a health care provider if:  You feel dizzy or lightheaded.  You feel nauseous.  You have abnormal vaginal discharge.  You have a rash.  You have pain that does not get better with medicine.  You have chills. Get help right away if:  You have bleeding that is heavier than a normal menstrual period.  You have a fever.  You have pain or cramps that get worse.  You develop new abdominal pain.  You faint.  You have pain in your shoulders.  You have shortness of breath. Summary  After the procedure, you may have cramping and some vaginal bleeding.  Do not douche, use tampons, or have sex for 2 weeks after the procedure, or until your health care provider approves.  Do not take baths, swim, or use a hot tub until your health care provider approves. Take showers instead of baths  for 2 weeks, or for as long as told by your health care provider.  Report any unusual symptoms to your health care provider.  Keep all follow-up visits as told by your health care provider. This is important. This information is not intended to replace advice given to you by your health care provider. Make sure you discuss any questions you have with your health care provider. Document Released: 06/21/2013 Document Revised: 09/29/2016 Document Reviewed: 09/29/2016 Elsevier Interactive Patient Education  2019 [place] Anesthesia Home Care Instructions  Activity: Get plenty of rest for the remainder of the day. A responsible individual must stay with you for 24 hours following the procedure.  For the next 24 hours, DO NOT: -Drive a car -Paediatric nurse -Drink alcoholic beverages -Take any medication unless instructed by your physician -Make any legal decisions or sign important papers.  Meals: Start with liquid foods such as gelatin or soup. Progress  to regular foods as tolerated. Avoid greasy, spicy, heavy foods. If nausea and/or vomiting occur, drink only clear liquids until the nausea and/or vomiting subsides. Call your physician if vomiting continues.  Special Instructions/Symptoms: Your throat may feel dry or sore from the anesthesia or the breathing tube placed in your throat during surgery. If this causes discomfort, gargle with warm salt water. The discomfort should disappear within 24 hours.

## 2019-01-23 NOTE — Anesthesia Procedure Notes (Signed)
Procedure Name: LMA Insertion Date/Time: 01/23/2019 7:32 AM Performed by: Bonney Aid, CRNA Pre-anesthesia Checklist: Patient identified, Emergency Drugs available, Suction available and Patient being monitored Patient Re-evaluated:Patient Re-evaluated prior to induction Oxygen Delivery Method: Circle system utilized Preoxygenation: Pre-oxygenation with 100% oxygen Induction Type: IV induction Ventilation: Mask ventilation without difficulty LMA: LMA inserted LMA Size: 4.0 Number of attempts: 1 Airway Equipment and Method: Bite block Placement Confirmation: positive ETCO2 Tube secured with: Tape Dental Injury: Teeth and Oropharynx as per pre-operative assessment

## 2019-01-23 NOTE — Transfer of Care (Signed)
Immediate Anesthesia Transfer of Care Note  Patient: Brandi Carpenter  Procedure(s) Performed: DILATATION & CURETTAGE/HYSTEROSCOPY WITH MYOSURE (N/A )  Patient Location: PACU  Anesthesia Type:General  Level of Consciousness: awake, alert  and oriented  Airway & Oxygen Therapy: Patient Spontanous Breathing and Patient connected to nasal cannula oxygen  Post-op Assessment: Report given to RN  Post vital signs: Reviewed and stable  Last Vitals:  Vitals Value Taken Time  BP 128/80 01/23/2019  8:18 AM  Temp    Pulse 72 01/23/2019  8:19 AM  Resp 12 01/23/2019  8:19 AM  SpO2 100 % 01/23/2019  8:19 AM  Vitals shown include unvalidated device data.  Last Pain:  Vitals:   01/23/19 0539  TempSrc: Oral  PainSc: 4       Patients Stated Pain Goal: 4 (29/47/65 [id number])  Complications: No apparent anesthesia complications

## 2019-01-23 NOTE — Anesthesia Postprocedure Evaluation (Signed)
Anesthesia Post Note  Patient: Brandi Carpenter  Procedure(s) Performed: DILATATION & CURETTAGE/HYSTEROSCOPY WITH MYOSURE (N/A Cervix)     Patient location during evaluation: PACU Anesthesia Type: General Level of consciousness: awake and alert Pain management: pain level controlled Vital Signs Assessment: post-procedure vital signs reviewed and stable Respiratory status: spontaneous breathing, nonlabored ventilation, respiratory function stable and patient connected to nasal cannula oxygen Cardiovascular status: blood pressure returned to baseline and stable Postop Assessment: no apparent nausea or vomiting Anesthetic complications: no    Last Vitals:  Vitals:   01/23/19 0830 01/23/19 0845  BP: 115/74 103/72  Pulse: 72 76  Resp: (!) 21 (!) 22  Temp:    SpO2: 100% 100%    Last Pain:  Vitals:   01/23/19 0910  TempSrc:   PainSc: 0-No pain                 Barnet Glasgow

## 2019-01-23 NOTE — Addendum Note (Signed)
Addendum  created 01/23/19 0928 by Bonney Aid, CRNA   Charge Capture section accepted

## 2019-01-24 ENCOUNTER — Encounter (HOSPITAL_BASED_OUTPATIENT_CLINIC_OR_DEPARTMENT_OTHER): Payer: Self-pay | Admitting: Obstetrics and Gynecology

## 2019-01-25 ENCOUNTER — Telehealth: Payer: Self-pay

## 2019-01-25 NOTE — Telephone Encounter (Signed)
Spoke with patient. Results given. Patient verbalizes understanding. Patient is feeling well. No questions or concerns at this time.  Routing to provider and will close encounter.

## 2019-01-25 NOTE — Telephone Encounter (Signed)
-----   Message from Nunzio Cobbs, MD sent at 01/25/2019  8:54 AM EDT ----- Please report final surgical pathology to the patient showing benign cervical and endometrial polyps.  No abnormal cells were seen.  I hope she is doing well following surgery this week!

## 2019-02-03 ENCOUNTER — Other Ambulatory Visit: Payer: Self-pay

## 2019-02-08 ENCOUNTER — Other Ambulatory Visit: Payer: Self-pay | Admitting: Neurosurgery

## 2019-02-08 ENCOUNTER — Encounter: Payer: Self-pay | Admitting: Obstetrics and Gynecology

## 2019-02-08 ENCOUNTER — Ambulatory Visit (INDEPENDENT_AMBULATORY_CARE_PROVIDER_SITE_OTHER): Payer: BC Managed Care – PPO | Admitting: Obstetrics and Gynecology

## 2019-02-08 ENCOUNTER — Other Ambulatory Visit: Payer: Self-pay

## 2019-02-08 VITALS — BP 118/68 | HR 72 | Temp 97.2°F | Resp 12 | Ht 64.0 in | Wt 185.0 lb

## 2019-02-08 DIAGNOSIS — R6882 Decreased libido: Secondary | ICD-10-CM | POA: Diagnosis not present

## 2019-02-08 DIAGNOSIS — Z9889 Other specified postprocedural states: Secondary | ICD-10-CM

## 2019-02-08 DIAGNOSIS — M5126 Other intervertebral disc displacement, lumbar region: Secondary | ICD-10-CM

## 2019-02-08 DIAGNOSIS — N951 Menopausal and female climacteric states: Secondary | ICD-10-CM

## 2019-02-08 DIAGNOSIS — R5383 Other fatigue: Secondary | ICD-10-CM

## 2019-02-08 MED ORDER — ESTRADIOL 0.05 MG/24HR TD PTWK: MEDICATED_PATCH | TRANSDERMAL | 3 refills | Status: DC

## 2019-02-08 NOTE — Progress Notes (Signed)
GYNECOLOGY  VISIT   HPI: 53 y.o.   Domestic Partner  Caucasian  female   [place] with Patient's last menstrual period was 01/06/2016.   here for  Post op [place] (N/A Cervix) Patient had postmenopausal bleeding on HRT, AGUS pap and evidence of endocervical and endometrial polyps on imaging during her work up.  Her colposcopy was negative and normal.  Final surgical pathology from hysteroscopic surgery - benign endocervical and endometrial polyps.  She is having increased hot flashes.  Feeling "blah" and having weight gain.  20 pound weight gain in the last 2 years.  Wants help with this.  TSH normal 04/2018.  Taking Wellbutrin and Celexa.   Hx back surgery and neck surgery.  Still having back pain but feeling better overall.  Taking Gabapentin.  Stopped Meloxicam due to fluid retention.   She is on HRT.  GYNECOLOGIC HISTORY: Patient's last menstrual period was 01/06/2016. Contraception:  PMP Menopausal hormone therapy: Climara and Progesterone Last mammogram:  04-29-18 birads 1:neg Last pap smear:   12-26-2018 atypical glandular cells HPV HR neg.  Colposcopy benign ECC and benign cervical biopsy.         OB History    Gravida  0   Para  0   Term  0   Preterm  0   AB  0   Living  0     SAB  0   TAB  0   Ectopic  0   Multiple  0   Live Births                 Patient Active Problem List   Diagnosis Date Noted  . Atypical glandular cells of undetermined significance (AGUS) on cervical Pap smear 01/08/2019  . Endometrial polyp 01/08/2019  . Postmenopausal bleeding 12/26/2018  . Lateral epicondylitis of right elbow 07/12/2018  . Lumbar radiculopathy, right 07/12/2018  . Piriformis syndrome of right side 06/08/2018  . Status post cervical spinal fusion 01/27/2016  . Headache 05/13/2015  . Blurry vision 05/13/2015  . Perceived hearing changes 05/13/2015  . Migraine headache 04/29/2015  . Raynaud phenomenon  08/28/2013  . Depression, major, in remission ([place]) 08/28/2013  . Allergic conjunctivitis 05/09/2013  . Anxiety 03/09/2012  . Insomnia 03/09/2012  . Allergic rhinitis 03/09/2012    Past Medical History:  Diagnosis Date  . Abnormal Pap smear    ASCUS 08/1999; CIN 11/1999, s/p conization, 12-26-2018 AGUS  . Allergic rhinitis, cause unspecified   . Anxiety   . Complication of anesthesia    "difficulty awakening" remote past  . DDD (degenerative disc disease), cervical 05/05/2018   Noted on imaging  . DDD (degenerative disc disease), lumbar 06/08/2018   Noted on imaging  . Depression   . Endometrial polyp   . Headache   . Insomnia   . PMB (postmenopausal bleeding)   . 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 Stenosis, cervical spine 05/05/2018   Noted on imaging  . Vitamin D deficiency     Past Surgical History:  Procedure Laterality Date  . ANKLE SURGERY  child   left for torn ligaments, left  . ANTERIOR CERVICAL DECOMP/DISCECTOMY FUSION N/A 01/27/2016   Procedure: C5-6, C6-7 Anterior Cervical Discectomy and Fusion, Allograft, Plate;  Surgeon: Marybelle Killings, MD;  Location: [facility];  Service: Orthopedics;  Laterality: N/A;  . CERVICAL CONIZATION W/BX  2001   Laser conization - Dr. Rhodia Albright - [facility] - Moderate to severe dysplasia,  negative margins  . COLONOSCOPY  09/2015  . DILATATION & CURETTAGE/HYSTEROSCOPY WITH MYOSURE N/A 01/23/2019   Procedure: DILATATION & CURETTAGE/HYSTEROSCOPY WITH MYOSURE;  Surgeon: Nunzio Cobbs, MD;  Location: [facility];  Service: Gynecology;  Laterality: N/A;  . LUMBAR [place] SURGERY  10/2018  . NASAL SEPTOPLASTY W/ TURBINOPLASTY Bilateral 10/07/2015   Procedure: NASAL SEPTOPLASTY WITH BILATERAL TURBINATE REDUCTION;  Surgeon: Leta Baptist, MD;  Location: [facility];  Service: ENT;  Laterality: Bilateral;  . PILONIDAL CYST EXCISION    . WISDOM TOOTH EXTRACTION      Current Outpatient  Medications  Medication Sig Dispense Refill  . ALPRAZolam (XANAX) 0.5 MG tablet TAKE 1/2 TO 1 TABLET BY MOUTH THREE TIMES DAILY AS NEEDED FOR ANXIETY 20 tablet 0  . azelastine (ASTELIN) 0.1 % nasal spray 2 (two) times daily.     Marland Kitchen buPROPion (WELLBUTRIN XL) 300 MG 24 hr tablet TAKE 1 TABLET BY MOUTH EVERY MORNING 90 tablet 3  . Cholecalciferol (VITAMIN D) 2000 UNITS tablet Take 2,000 Units by mouth daily.    . citalopram (CELEXA) 20 MG tablet TAKE 1 TABLET BY MOUTH ONCE DAILY (Patient taking differently: every evening. take 1 tablet by mouth once daily) 90 tablet 3  . cyclobenzaprine (FLEXERIL) 10 MG tablet as needed.     Marland Kitchen EPIPEN 2-PAK 0.3 MG/0.3ML SOAJ injection Reported on 02/17/2016  0  . estradiol (CLIMARA - DOSED IN MG/24 HR) 0.05 mg/24hr patch APPLY 1 PATCH ONTO THE SKIN ONCE A WEEK 4 patch 5  . fluticasone (FLONASE) 50 MCG/ACT nasal spray INSTILL 2 SPRAYS INTO EACH NOSTRIL twice a day  0  . gabapentin (NEURONTIN) 100 MG capsule Take 2 capsules (200 mg total) by mouth at bedtime. 180 capsule 1  . guaiFENesin (MUCINEX) 600 MG 12 hr tablet Take 1,200 mg by mouth 2 (two) times daily.    Marland Kitchen HYDROcodone-acetaminophen (NORCO/VICODIN) 5-325 MG tablet Take 1 tablet by mouth every 6 (six) hours as needed.    Marland Kitchen levocetirizine (XYZAL) 5 MG tablet TAKE 1 TABLET BY MOUTH EVERY EVENING 30 tablet 8  . meloxicam (MOBIC) 15 MG tablet Take 15 mg by mouth every evening.    Marland Kitchen olopatadine (PATANOL) 0.1 % ophthalmic solution place 1 drop into both eyes twice a day (Patient taking differently: as needed. place 1 drop into both eyes twice a day) 5 mL 5  . progesterone (PROMETRIUM) 100 MG capsule TAKE 1 CAPSULE(100 MG) BY MOUTH DAILY (Patient taking differently: every evening. TAKE 1 CAPSULE(100 MG) BY MOUTH DAILY) 30 capsule 0  . zolpidem (AMBIEN) 10 MG tablet TAKE 1/2 TO 1 TABLET(5 TO 10 MG) BY MOUTH AT BEDTIME AS NEEDED FOR SLEEP 30 tablet 1   No current facility-administered medications for this visit.       ALLERGIES: Codeine and Pamelor [nortriptyline]  Family History  Problem Relation Age of Onset  . Hypertension Mother   . Diabetes Mother   . Heart disease Father   . Depression Sister   . Breast cancer Cousin   . Colon cancer Maternal Grandmother         died at 8  . Diabetes Maternal Grandmother   . Alzheimer's disease Paternal Grandmother   . Colon polyps Maternal Uncle   . Migraines Neg Hx     Social History   Socioeconomic History  . Marital status: Soil scientist    Spouse name: Not on file  . Number of children: 0  . Years of education: 32  .  Highest education level: Not on file  Occupational History  . Occupation: Naval architect: [employer]  Social Needs  . Financial resource strain: Not on file  . Food insecurity:    Worry: Not on file    Inability: Not on file  . Transportation needs:    Medical: Not on file    Non-medical: Not on file  Tobacco Use  . Smoking status: Never Smoker  . Smokeless tobacco: Never Used  Substance and Sexual Activity  . Alcohol use: No    Alcohol/week: 0.0 standard drinks  . Drug use: Never  . Sexual activity: Not Currently    Partners: Female    Birth control/protection: Abstinence, Post-menopausal  Lifestyle  . Physical activity:    Days per week: Not on file    Minutes per session: Not on file  . Stress: Not on file  Relationships  . Social connections:    Talks on phone: Not on file    Gets together: Not on file    Attends religious service: Not on file    Active member of club or organization: Not on file    Attends meetings of clubs or organizations: Not on file    Relationship status: Not on file  . Intimate partner violence:    Fear of current or ex partner: Not on file    Emotionally abused: Not on file    Physically abused: Not on file    Forced sexual activity: Not on file  Other Topics Concern  . Not on file  Social History Narrative   Lives with female partner Juliann Pulse), 2 beagles     Review of Systems  Constitutional: Negative.   HENT: Negative.   Eyes: Negative.   Respiratory: Negative.   Cardiovascular: Negative.   Gastrointestinal: Negative.   Endocrine: Negative.   Genitourinary: Negative.   Musculoskeletal: Negative.   Skin: Negative.   Allergic/Immunologic: Negative.   Neurological: Negative.   Hematological: Negative.   Psychiatric/Behavioral: Negative.     PHYSICAL EXAMINATION:    BP 118/68 (BP Location: Left Arm, Patient Position: Sitting, Cuff Size: Large)   Pulse 72   Temp (!) 97.2 F (36.2 C) (Temporal)   Resp 12   Ht 5\' 4"  (1.626 m)   Wt 185 lb (83.9 kg)   LMP 01/06/2016   BMI 31.76 kg/m     General appearance: alert, cooperative and appears stated age   Pelvic: External genitalia:  no lesions              Urethra:  normal appearing urethra with no masses, tenderness or lesions              Bartholins and Skenes: normal                 Vagina: normal appearing vagina with normal color and discharge, no lesions              Cervix: no lesions                Bimanual Exam:  Uterus:  normal size, contour, position, consistency, mobility, non-tender              Adnexa: no mass, fullness, tenderness             Chaperone was present for exam.  ASSESSMENT   Status post hysteroscopy with dilation and curettage, removal of endometrial and endocervical polyp.  AGUS pap and negative colposcopy.  Menopausal symptoms.  Fatigue. Decreased libido. On  Wellbutrin and Celexa.  PLAN  Surgical findings, procedure, and pathology report reviewed with patient. Increase to Climara patch 0.1 mg weekly.  Check testosterone and vit B12 level. I anticipate prescribing testosterone therapy. We discussed risks and benefits of testosterone treatment, and she is interested in this. Call for any future vaginal bleeding. FU for annual exam.   An After Visit Summary was printed and given to the patient.  __15____ minutes face to face time of which  over 50% was spent in counseling.

## 2019-02-11 ENCOUNTER — Ambulatory Visit
Admission: RE | Admit: 2019-02-11 | Discharge: 2019-02-11 | Disposition: A | Discharge status: Home / Self Care | Payer: BC Managed Care – PPO | Source: Ambulatory Visit | Attending: Neurosurgery | Admitting: Neurosurgery

## 2019-02-11 ENCOUNTER — Other Ambulatory Visit: Payer: Self-pay

## 2019-02-11 DIAGNOSIS — M5126 Other intervertebral disc displacement, lumbar region: Secondary | ICD-10-CM

## 2019-02-11 LAB — TESTOSTERONE, FREE, DIRECT
Testosterone, Free: 1.4 pg/mL (ref 0.0–4.2)
Testosterone, Total, LC/MS: 18 ng/dL

## 2019-02-11 LAB — VITAMIN B12: Vitamin B-12: 566 pg/mL (ref 232–1245)

## 2019-02-11 MED ORDER — GADOBENATE DIMEGLUMINE 529 MG/ML IV SOLN
17.0000 mL | Freq: Once | INTRAVENOUS | Status: AC | PRN
  Administered 2019-02-11: 11:00:00 17 mL via INTRAVENOUS

## 2019-02-13 ENCOUNTER — Other Ambulatory Visit: Payer: Self-pay | Admitting: Neurosurgery

## 2019-02-13 DIAGNOSIS — M5126 Other intervertebral disc displacement, lumbar region: Secondary | ICD-10-CM

## 2019-02-15 ENCOUNTER — Other Ambulatory Visit: Payer: Self-pay | Admitting: *Deleted

## 2019-02-15 MED ORDER — NONFORMULARY OR COMPOUNDED ITEM: 5 refills | Status: DC

## 2019-02-18 ENCOUNTER — Other Ambulatory Visit: Payer: Self-pay | Admitting: Family Medicine

## 2019-02-18 DIAGNOSIS — G4700 Insomnia, unspecified: Secondary | ICD-10-CM

## 2019-02-22 ENCOUNTER — Telehealth: Payer: Self-pay | Admitting: Obstetrics and Gynecology

## 2019-02-22 NOTE — Telephone Encounter (Signed)
Patient calling regarding estradiol prescription. States pharmacy does not have it and it has been a couple of weeks. [facility] at [address].

## 2019-02-22 NOTE — Telephone Encounter (Signed)
Spoke with patient. Advised Rx for estradiol 0.05mg  patch was sent to [facility] on 02/08/19 #12/3RF. Advised patient to f/u with pharmacy for filling, return call to office if assistance still needed.   Patient verbalizes understanding and is agreeable.   Encounter closed.

## 2019-02-25 ENCOUNTER — Other Ambulatory Visit: Payer: Self-pay | Admitting: Obstetrics and Gynecology

## 2019-02-25 DIAGNOSIS — N959 Unspecified menopausal and perimenopausal disorder: Secondary | ICD-10-CM

## 2019-02-27 NOTE — Telephone Encounter (Signed)
Medication refill request: Progesterone  Last OV:  02-08-2019 BS  Next OV: 03-29-2019 Last MMG (if hormonal medication request): 04-29-18 density C/BIRADS 1 negative  Refill authorized: today, please advise.   Medication pended for #30, 0RF. Please refill if appropriate.

## 2019-03-04 ENCOUNTER — Other Ambulatory Visit: Payer: BC Managed Care – PPO

## 2019-03-27 ENCOUNTER — Other Ambulatory Visit: Payer: Self-pay

## 2019-03-29 ENCOUNTER — Ambulatory Visit: Payer: BC Managed Care – PPO | Admitting: Obstetrics and Gynecology

## 2019-03-29 ENCOUNTER — Encounter: Payer: Self-pay | Admitting: Obstetrics and Gynecology

## 2019-03-29 ENCOUNTER — Other Ambulatory Visit: Payer: Self-pay

## 2019-03-29 VITALS — BP 118/76 | HR 72 | Temp 97.3°F | Resp 12 | Wt 183.0 lb

## 2019-03-29 DIAGNOSIS — R6882 Decreased libido: Secondary | ICD-10-CM | POA: Diagnosis not present

## 2019-03-29 NOTE — Progress Notes (Signed)
GYNECOLOGY  VISIT   HPI: 53 y.o.   Domestic Partner  Caucasian  female   [place] with Patient's last menstrual period was 01/06/2016.   here for med f/u.  Taking testosterone for decreased desire.  Decreased libido for one year.   Using testosterone daily and not feeling any different.  No increased hair growth, acne.  No vaginal bleeding.   No pain with sexual activity.  No new stressors.  Has plenty of time with her partner.   GYNECOLOGIC HISTORY: Patient's last menstrual period was 01/06/2016. Contraception:  PMP Menopausal hormone therapy:  Climara and Progesterone Last mammogram:  04-29-18 birads 1:neg Last pap smear: 12-26-2018 atypical glandular cells HPV HR neg.  Colposcopy benign ECC and benign cervical biopsy.         OB History    Gravida  0   Para  0   Term  0   Preterm  0   AB  0   Living  0     SAB  0   TAB  0   Ectopic  0   Multiple  0   Live Births                 Patient Active Problem List   Diagnosis Date Noted  . Atypical glandular cells of undetermined significance (AGUS) on cervical Pap smear 01/08/2019  . Endometrial polyp 01/08/2019  . Postmenopausal bleeding 12/26/2018  . Lateral epicondylitis of right elbow 07/12/2018  . Lumbar radiculopathy, right 07/12/2018  . Piriformis syndrome of right side 06/08/2018  . Status post cervical spinal fusion 01/27/2016  . Headache 05/13/2015  . Blurry vision 05/13/2015  . Perceived hearing changes 05/13/2015  . Migraine headache 04/29/2015  . Raynaud phenomenon 08/28/2013  . Depression, major, in remission ([place]) 08/28/2013  . Allergic conjunctivitis 05/09/2013  . Anxiety 03/09/2012  . Insomnia 03/09/2012  . Allergic rhinitis 03/09/2012    Past Medical History:  Diagnosis Date  . Abnormal Pap smear    ASCUS 08/1999; CIN 11/1999, s/p conization, 12-26-2018 AGUS  . Allergic rhinitis, cause unspecified   . Anxiety   . Complication of anesthesia    "difficulty awakening" remote past   . DDD (degenerative disc disease), cervical 05/05/2018   Noted on imaging  . DDD (degenerative disc disease), lumbar 06/08/2018   Noted on imaging  . Depression   . Endometrial polyp   . Headache   . Insomnia   . PMB (postmenopausal bleeding)   . 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 Stenosis, cervical spine 05/05/2018   Noted on imaging  . Vitamin D deficiency     Past Surgical History:  Procedure Laterality Date  . ANKLE SURGERY  child   left for torn ligaments, left  . ANTERIOR CERVICAL DECOMP/DISCECTOMY FUSION N/A 01/27/2016   Procedure: C5-6, C6-7 Anterior Cervical Discectomy and Fusion, Allograft, Plate;  Surgeon: Marybelle Killings, MD;  Location: [facility];  Service: Orthopedics;  Laterality: N/A;  . CERVICAL CONIZATION W/BX  2001   Laser conization - Dr. Rhodia Albright - [facility] - Moderate to severe dysplasia, negative margins  . COLONOSCOPY  09/2015  . DILATATION & CURETTAGE/HYSTEROSCOPY WITH MYOSURE N/A 01/23/2019   Procedure: DILATATION & CURETTAGE/HYSTEROSCOPY WITH MYOSURE;  Surgeon: Nunzio Cobbs, MD;  Location: [facility];  Service: Gynecology;  Laterality: N/A;  . LUMBAR [place] SURGERY  10/2018  . NASAL SEPTOPLASTY W/ TURBINOPLASTY Bilateral 10/07/2015   Procedure: NASAL SEPTOPLASTY WITH BILATERAL TURBINATE REDUCTION;  Surgeon: Leta Baptist, MD;  Location: [facility];  Service: ENT;  Laterality: Bilateral;  . PILONIDAL CYST EXCISION    . WISDOM TOOTH EXTRACTION      Current Outpatient Medications  Medication Sig Dispense Refill  . ALPRAZolam (XANAX) 0.5 MG tablet TAKE 1/2 TO 1 TABLET BY MOUTH THREE TIMES DAILY AS NEEDED FOR ANXIETY 20 tablet 0  . azelastine (ASTELIN) 0.1 % nasal spray 2 (two) times daily.     Marland Kitchen buPROPion (WELLBUTRIN XL) 300 MG 24 hr tablet TAKE 1 TABLET BY MOUTH EVERY MORNING 90 tablet 3  . Cholecalciferol (VITAMIN D) 2000 UNITS tablet Take 2,000 Units by mouth daily.    . citalopram (CELEXA) 20 MG  tablet TAKE 1 TABLET BY MOUTH ONCE DAILY (Patient taking differently: every evening. take 1 tablet by mouth once daily) 90 tablet 3  . EPIPEN 2-PAK 0.3 MG/0.3ML SOAJ injection Reported on 02/17/2016  0  . estradiol (CLIMARA - DOSED IN MG/24 HR) 0.05 mg/24hr patch APPLY 1 PATCH ONTO THE SKIN ONCE A WEEK 12 patch 3  . fluticasone (FLONASE) 50 MCG/ACT nasal spray INSTILL 2 SPRAYS INTO EACH NOSTRIL twice a day  0  . guaiFENesin (MUCINEX) 600 MG 12 hr tablet Take 1,200 mg by mouth 2 (two) times daily.    Marland Kitchen levocetirizine (XYZAL) 5 MG tablet TAKE 1 TABLET BY MOUTH EVERY EVENING 30 tablet 8  . NONFORMULARY OR COMPOUNDED ITEM Compounded 1% testosterone cream. Apply 0.5 gram daily to skin of arm, leg, or abdomen.  Rotate site.  Dispense:  1 month supply, RF 5. 1 each 5  . olopatadine (PATANOL) 0.1 % ophthalmic solution place 1 drop into both eyes twice a day (Patient taking differently: as needed. place 1 drop into both eyes twice a day) 5 mL 5  . progesterone (PROMETRIUM) 100 MG capsule Take 1 capsule (100 mg total) by mouth every evening. TAKE 1 CAPSULE(100 MG) BY MOUTH DAILY 90 capsule 1  . zolpidem (AMBIEN) 10 MG tablet TAKE 1/2 TO 1 TABLET(5 TO 10 MG) BY MOUTH AT BEDTIME AS NEEDED FOR SLEEP 30 tablet 1   No current facility-administered medications for this visit.      ALLERGIES: Codeine and Pamelor [nortriptyline]  Family History  Problem Relation Age of Onset  . Hypertension Mother   . Diabetes Mother   . Heart disease Father   . Depression Sister   . Breast cancer Cousin   . Colon cancer Maternal Grandmother         died at 44  . Diabetes Maternal Grandmother   . Alzheimer's disease Paternal Grandmother   . Colon polyps Maternal Uncle   . Migraines Neg Hx     Social History   Socioeconomic History  . Marital status: Soil scientist    Spouse name: Not on file  . Number of children: 0  . Years of education: 2  . Highest education level: Not on file  Occupational History  .  Occupation: Naval architect: [employer]  Social Needs  . Financial resource strain: Not on file  . Food insecurity    Worry: Not on file    Inability: Not on file  . Transportation needs    Medical: Not on file    Non-medical: Not on file  Tobacco Use  . Smoking status: Never Smoker  . Smokeless tobacco: Never Used  Substance and Sexual Activity  . Alcohol use: No    Alcohol/week: 0.0 standard drinks  . Drug use:  Never  . Sexual activity: Not Currently    Partners: Female    Birth control/protection: Abstinence, Post-menopausal  Lifestyle  . Physical activity    Days per week: Not on file    Minutes per session: Not on file  . Stress: Not on file  Relationships  . Social Herbalist on phone: Not on file    Gets together: Not on file    Attends religious service: Not on file    Active member of club or organization: Not on file    Attends meetings of clubs or organizations: Not on file    Relationship status: Not on file  . Intimate partner violence    Fear of current or ex partner: Not on file    Emotionally abused: Not on file    Physically abused: Not on file    Forced sexual activity: Not on file  Other Topics Concern  . Not on file  Social History Narrative   Lives with female partner Juliann Pulse), 2 beagles    Review of Systems  Constitutional: Negative.   HENT: Negative.   Eyes: Negative.   Respiratory: Negative.   Cardiovascular: Negative.   Gastrointestinal: Negative.   Endocrine: Negative.   Genitourinary: Negative.   Musculoskeletal: Negative.   Skin: Negative.   Allergic/Immunologic: Negative.   Neurological: Negative.   Hematological: Negative.   Psychiatric/Behavioral: Negative.     PHYSICAL EXAMINATION:    BP 118/76 (BP Location: Left Arm, Patient Position: Sitting, Cuff Size: Large)   Pulse 72   Temp (!) 97.3 F (36.3 C) (Temporal)   Resp 12   Wt 183 lb (83 kg)   LMP 01/06/2016   BMI 31.41 kg/m     General  appearance: alert, cooperative and appears stated age   Chaperone was present for exam.  ASSESSMENT  Decreased libido.  On testosterone therapy.   PLAN  We discussed decreased libido.  Will check testosterone levels now.  May need to increase dosage.  We reviewed Wellbutrin as a treatment for decreased libido.  The Celexa may also be contributing to decreased desire.  I mentioned [facility].  Fu prn.    An After Visit Summary was printed and given to the patient.  __15____ minutes face to face time of which over 50% was spent in counseling.

## 2019-03-31 LAB — TESTOSTERONE, FREE, DIRECT
Testosterone, Free: 1.4 pg/mL (ref 0.0–4.2)
Testosterone, Total, LC/MS: 40.2 ng/dL

## 2019-04-02 NOTE — Addendum Note (Signed)
Addended by: Yisroel Ramming, Dietrich Pates E on: 04/02/2019 04:33 PM   Modules accepted: Orders

## 2019-04-11 ENCOUNTER — Other Ambulatory Visit: Payer: Self-pay | Admitting: Family Medicine

## 2019-04-11 DIAGNOSIS — F411 Generalized anxiety disorder: Secondary | ICD-10-CM

## 2019-04-12 NOTE — Telephone Encounter (Signed)
Last filled 11/2018.  PDMP reviewed

## 2019-04-19 ENCOUNTER — Other Ambulatory Visit: Payer: Self-pay

## 2019-04-19 ENCOUNTER — Ambulatory Visit: Payer: BC Managed Care – PPO | Admitting: Family Medicine

## 2019-04-19 ENCOUNTER — Encounter: Payer: Self-pay | Admitting: Family Medicine

## 2019-04-19 VITALS — Ht 64.0 in | Wt 183.0 lb

## 2019-04-19 DIAGNOSIS — F325 Major depressive disorder, single episode, in full remission: Secondary | ICD-10-CM | POA: Diagnosis not present

## 2019-04-19 DIAGNOSIS — R454 Irritability and anger: Secondary | ICD-10-CM | POA: Diagnosis not present

## 2019-04-19 DIAGNOSIS — G2581 Restless legs syndrome: Secondary | ICD-10-CM

## 2019-04-19 MED ORDER — GABAPENTIN 100 MG PO CAPS: 200.0000 mg | ORAL_CAPSULE | Freq: Every day | ORAL | 5 refills | Status: DC

## 2019-04-19 NOTE — Patient Instructions (Addendum)
Take gabapentin 200mg  (2 of the 100mg  capsules) at bedtime.  This should help with restless legs. It is possible that your sleep is affected by this, which can contribute to increased irritability.  It is possible also that the testosterone may be contributing to irritability.  Since it doesn't seem to be improving your libido, you can try stopping this and see how you do.  Send me an update in the next 3-4 weeks, as to how you're doing with respect to irritability/moods, as well as the restless legs.  We should consider sending you to a psychiatrist for further medication management for moods if they aren't improving. Crossing fingers these few changes do the trick!   Mindfulness-Based Stress Reduction Mindfulness-based stress reduction (MBSR) is a program that helps people learn to practice mindfulness. Mindfulness is the practice of intentionally paying attention to the present moment. It can be learned and practiced through techniques such as education, breathing exercises, meditation, and yoga. MBSR includes several mindfulness techniques in one program. MBSR works best when you understand the treatment, are willing to try new things, and can commit to spending time practicing what you learn. MBSR training may include learning about:  How your emotions, thoughts, and reactions affect your body.  New ways to respond to things that cause negative thoughts to start (triggers).  How to notice your thoughts and let go of them.  Practicing awareness of everyday things that you normally do without thinking.  The techniques and goals of different types of meditation. What are the benefits of MBSR? MBSR can have many benefits, which include helping you to:  Develop self-awareness. This refers to knowing and understanding yourself.  Learn skills and attitudes that help you to participate in your own health care.  Learn new ways to care for yourself.  Be more accepting about how things are,  and let things go.  Be less judgmental and approach things with an open mind.  Be patient with yourself and trust yourself more. MBSR has also been shown to:  Reduce negative emotions, such as depression and anxiety.  Improve memory and focus.  Change how you sense and approach pain.  Boost your body's ability to fight infections.  Help you connect better with other people.  Improve your sense of well-being. Follow these instructions at home:   Find a local in-person or online MBSR program.  Set aside some time regularly for mindfulness practice.  Find a mindfulness practice that works best for you. This may include one or more of the following: ? Meditation. Meditation involves focusing your mind on a certain thought or activity. ? Breathing awareness exercises. These help you to stay present by focusing on your breath. ? Body scan. For this practice, you lie down and pay attention to each part of your body from head to toe. You can identify tension and soreness and intentionally relax parts of your body. ? Yoga. Yoga involves stretching and breathing, and it can improve your ability to move and be flexible. It can also provide an experience of testing your body's limits, which can help you release stress. ? Mindful eating. This way of eating involves focusing on the taste, texture, color, and smell of each bite of food. Because this slows down eating and helps you feel full sooner, it can be an important part of a weight-loss plan.  Find a podcast or recording that provides guidance for breathing awareness, body scan, or meditation exercises. You can listen to these any time when  you have a free moment to rest without distractions.  Follow your treatment plan as told by your health care provider. This may include taking regular medicines and making changes to your diet or lifestyle as recommended. How to practice mindfulness To do a basic awareness exercise:  Find a  comfortable place to sit.  Pay attention to the present moment. Observe your thoughts, feelings, and surroundings just as they are.  Avoid placing judgment on yourself, your feelings, or your surroundings. Make note of any judgment that comes up, and let it go.  Your mind may wander, and that is okay. Make note of when your thoughts drift, and return your attention to the present moment. To do basic mindfulness meditation:  Find a comfortable place to sit. This may include a stable chair or a firm floor cushion. ? Sit upright with your back straight. Let your arms fall next to your side with your hands resting on your legs. ? If sitting in a chair, rest your feet flat on the floor. ? If sitting on a cushion, cross your legs in front of you.  Keep your head in a neutral position with your chin dropped slightly. Relax your jaw and rest the tip of your tongue on the roof of your mouth. Drop your gaze to the floor. You can close your eyes if you like.  Breathe normally and pay attention to your breath. Feel the air moving in and out of your nose. Feel your belly expanding and relaxing with each breath.  Your mind may wander, and that is okay. Make note of when your thoughts drift, and return your attention to your breath.  Avoid placing judgment on yourself, your feelings, or your surroundings. Make note of any judgment or feelings that come up, let them go, and bring your attention back to your breath.  When you are ready, lift your gaze or open your eyes. Pay attention to how your body feels after the meditation. Where to find more information You can find more information about MBSR from:  Your health care provider.  Community-based meditation centers or programs.  Programs offered near you. Summary  Mindfulness-based stress reduction (MBSR) is a program that teaches you how to intentionally pay attention to the present moment. It is used with other treatments to help you cope better  with daily stress, emotions, and pain.  MBSR focuses on developing self-awareness, which allows you to respond to life stress without judgment or negative emotions.  MBSR programs may involve learning different mindfulness practices, such as breathing exercises, meditation, yoga, body scan, or mindful eating. Find a mindfulness practice that works best for you, and set aside time for it on a regular basis. This information is not intended to replace advice given to you by your health care provider. Make sure you discuss any questions you have with your health care provider. Document Released: 01/07/2017 Document Revised: 08/13/2017 Document Reviewed: 01/07/2017 Elsevier Patient Education  2020 [employer].

## 2019-04-19 NOTE — Progress Notes (Signed)
Start time: 11:14 End time: 11:30  Virtual Visit via Video Note  I connected with Brandi Carpenter on 04/19/19 by a video enabled telemedicine application and verified that I am speaking with the correct person using two identifiers.  Location: Patient: home  Provider: office   I discussed the limitations of evaluation and management by telemedicine and the availability of in person appointments. The patient expressed understanding and agreed to proceed. Consents to insurance being filed for visit.  History of Present Illness:  Chief Complaint  Patient presents with  . Consult    would like to discuss possible med change/increase. Partner has noticed that she is more irritable, she feels like that as well. Her legs have been "jumping" lately at night as used xanax recently for this, used to be in gabapentin for this and now wonders if this was helping.    She is taking alprazolam more at night recently due to legs being jumpy. She has h/o RLS.  She was on gabapentin from Dr. Tamala Julian for her back, and now that she isn't taking it, RLS is worse.  Thinks it may have helped her legs (didn't really realize it until she stopped it). She has never taken other medications for RLS.  She is feeling edgy, irritable for 2 months. Using testosterone (topically, from GYN), which hasn't helped her libido. Feels fine about pandemic (working, still talks to people), doesn't feel particularly anxious or worried.  PMH, PSH, SH reviewed  Outpatient Encounter Medications as of 04/19/2019  Medication Sig  . ALPRAZolam (XANAX) 0.5 MG tablet TAKE 1/2 TO 1 TABLET BY MOUTH THREE TIMES DAILY AS NEEDED FOR ANXIETY  . azelastine (ASTELIN) 0.1 % nasal spray 2 (two) times daily.   Marland Kitchen buPROPion (WELLBUTRIN XL) 300 MG 24 hr tablet TAKE 1 TABLET BY MOUTH EVERY MORNING  . Cholecalciferol (VITAMIN D) 2000 UNITS tablet Take 2,000 Units by mouth daily.  . citalopram (CELEXA) 20 MG tablet TAKE 1 TABLET BY MOUTH ONCE DAILY  (Patient taking differently: every evening. take 1 tablet by mouth once daily)  . estradiol (CLIMARA - DOSED IN MG/24 HR) 0.05 mg/24hr patch APPLY 1 PATCH ONTO THE SKIN ONCE A WEEK  . fluticasone (FLONASE) 50 MCG/ACT nasal spray INSTILL 2 SPRAYS INTO EACH NOSTRIL twice a day  . guaiFENesin (MUCINEX) 600 MG 12 hr tablet Take 1,200 mg by mouth 2 (two) times daily.  Marland Kitchen levocetirizine (XYZAL) 5 MG tablet TAKE 1 TABLET BY MOUTH EVERY EVENING  . NONFORMULARY OR COMPOUNDED ITEM Compounded 1% testosterone cream. Apply 0.5 gram daily to skin of arm, leg, or abdomen.  Rotate site.  Dispense:  1 month supply, RF 5.  . olopatadine (PATANOL) 0.1 % ophthalmic solution place 1 drop into both eyes twice a day (Patient taking differently: as needed. place 1 drop into both eyes twice a day)  . progesterone (PROMETRIUM) 100 MG capsule Take 1 capsule (100 mg total) by mouth every evening. TAKE 1 CAPSULE(100 MG) BY MOUTH DAILY  . zolpidem (AMBIEN) 10 MG tablet TAKE 1/2 TO 1 TABLET(5 TO 10 MG) BY MOUTH AT BEDTIME AS NEEDED FOR SLEEP  . EPIPEN 2-PAK 0.3 MG/0.3ML SOAJ injection Reported on 02/17/2016  . gabapentin (NEURONTIN) 100 MG capsule Take 2 capsules (200 mg total) by mouth at bedtime.   No facility-administered encounter medications on file as of 04/19/2019.    (NOT taking gabapentin prior to today's visit)  Allergies  Allergen Reactions  . Codeine Itching and Nausea And Vomiting  . Pamelor [Nortriptyline]  Tinitus    ROS: no fever, chills, URI symptoms. Allergies are controlled.  +irritability and restless legs per HPI.  Denies significant depression or anxiety.    Observations/Objective:  Ht 5\' 4"  (1.626 m)   Wt 183 lb (83 kg)   LMP 01/06/2016   BMI 31.41 kg/m   Well-appearing, pleasant female, in good spirits. She is alert, oriented, cranial nerves grossly intact. Exam limited due to virtual nature of the visit.  Assessment and Plan:  Restless leg syndrome - Plan: gabapentin (NEURONTIN) 100 MG  capsule  Irritability - ?if related to worse sleep from RLS vs potentially from testosterone (doubt, since topical only)  Depression, major, in remission ([place]) - continue current doses of Wellbutrin and citalopram    RLS--restart gabapentin 100mg  , taking 2 qHS Stop testosterone Mindfulness/exercise Touch base in 3-4 weeks  Follow Up Instructions:    I discussed the assessment and treatment plan with the patient. The patient was provided an opportunity to ask questions and all were answered. The patient agreed with the plan and demonstrated an understanding of the instructions.   The patient was advised to call back or seek an in-person evaluation if the symptoms worsen or if the condition fails to improve as anticipated.  I provided 16 minutes of non-face-to-face time during this encounter.   Vikki Ports, MD

## 2019-06-12 ENCOUNTER — Telehealth: Payer: Self-pay | Admitting: Obstetrics and Gynecology

## 2019-06-12 NOTE — Telephone Encounter (Signed)
Please contact patient in follow up to her testosterone therapy.   If she is still taking this, I recommend a follow up testosterone level check with a lab visit.  We increased her dosage earlier this summer.

## 2019-06-20 NOTE — Telephone Encounter (Signed)
Left message for patient to call Tyner Codner, CMA. 

## 2019-06-25 ENCOUNTER — Other Ambulatory Visit: Payer: Self-pay | Admitting: Family Medicine

## 2019-06-25 DIAGNOSIS — G4700 Insomnia, unspecified: Secondary | ICD-10-CM

## 2019-06-26 NOTE — Telephone Encounter (Signed)
Is this okay to refill? 

## 2019-06-29 NOTE — Telephone Encounter (Signed)
Left message to call Cylus Douville, RN at [facility] [phone].   

## 2019-06-29 NOTE — Telephone Encounter (Signed)
Please contact patient in follow up to need for recheck of testosterone level if she is still on this therapy.

## 2019-07-04 ENCOUNTER — Other Ambulatory Visit: Payer: Self-pay | Admitting: Family Medicine

## 2019-07-04 ENCOUNTER — Other Ambulatory Visit: Payer: Self-pay | Admitting: Obstetrics and Gynecology

## 2019-07-04 DIAGNOSIS — Z1231 Encounter for screening mammogram for malignant neoplasm of breast: Secondary | ICD-10-CM

## 2019-07-04 NOTE — Telephone Encounter (Signed)
Spoke with patient. Patient states she stopped testosterone therapy a couple of months ago.   Advised if no longer using testosterone recheck since no longer on medication. Advised I will update Dr. Quincy Simmonds. Patient verbalizes understanding.   Routing to provider for final review. Patient is agreeable to disposition. Will close encounter.

## 2019-07-05 ENCOUNTER — Other Ambulatory Visit: Payer: Self-pay

## 2019-07-05 ENCOUNTER — Ambulatory Visit: Payer: BC Managed Care – PPO | Admitting: Family Medicine

## 2019-07-05 ENCOUNTER — Encounter: Payer: Self-pay | Admitting: Family Medicine

## 2019-07-05 ENCOUNTER — Encounter: Payer: Self-pay | Admitting: *Deleted

## 2019-07-05 VITALS — BP 128/72 | HR 72 | Temp 98.4°F | Ht 64.0 in | Wt 182.2 lb

## 2019-07-05 DIAGNOSIS — J302 Other seasonal allergic rhinitis: Secondary | ICD-10-CM

## 2019-07-05 DIAGNOSIS — K1379 Other lesions of oral mucosa: Secondary | ICD-10-CM

## 2019-07-05 MED ORDER — MAGIC MOUTHWASH: 5.0000 mL | Freq: Four times a day (QID) | ORAL | 1 refills | Status: DC | PRN

## 2019-07-05 NOTE — Progress Notes (Signed)
Chief Complaint  Patient presents with  . Burning    inside of her mouth. Roof of her mouth as well. Had some dark red spot this past weekend on the roof of her mouth. Started after nasal surgery (10/07/2015). Said something to Benjamine Mola about it in the past.    After 09/2015 surgery, she had some irritation in the inside of her mouth.  Dr Benjamine Mola at her f/u visit didn't really see anything.  It comes and goes.  Feels like the burning/irritation is in the tongue and roof of the mouth, gums.  Sometimes her throat is sore.  It used to be sporadic, but in the last 3 months it has been more constant.  Not related to eating, can't find any pattern.  No changes to mouthwash, toothpaste or other products. Some itching in her ears. She stopped Astelin, didn't see a difference. Dr. Harold Hedge also didn't see anything or have suggestions. Hasn't been taking allergy shots, notices a little more allergies, some ear popping.  Never saw any white spots in her mouth.  Over the weekend she noticed dark red spots on the roof of her mouth, which are no longer there.  No known trauma or burn.  She has been wearing a retainer at night since March, usually wears it every other night. It doesn't cover the palate. Didn't mention it to her orthodondist, since it pre-dated braces, retainer.  Thought it could be acid reflux.  Tried a PPI (OTC, either prilosec or prevacid), and couldn't really tell a difference.  She had some heartburn, which resolved, but the mouth discomfort was unchanged.  She had persistent numbness in the roof of the mouth that she knows is from surgery (at the very front, behind the teeth), which has mostly resolved.  Stopped testosterone and irritability resolved.  RLS is much better since restarting the gabapentin  PMH, PSH, SH reviewed  Outpatient Encounter Medications as of 07/05/2019  Medication Sig Note  . ALPRAZolam (XANAX) 0.5 MG tablet TAKE 1/2 TO 1 TABLET BY MOUTH THREE TIMES DAILY AS  NEEDED FOR ANXIETY   . buPROPion (WELLBUTRIN XL) 300 MG 24 hr tablet TAKE 1 TABLET BY MOUTH EVERY MORNING   . Cholecalciferol (VITAMIN D) 2000 UNITS tablet Take 2,000 Units by mouth daily.   . citalopram (CELEXA) 20 MG tablet TAKE 1 TABLET BY MOUTH ONCE DAILY (Patient taking differently: every evening. take 1 tablet by mouth once daily)   . estradiol (CLIMARA - DOSED IN MG/24 HR) 0.05 mg/24hr patch APPLY 1 PATCH ONTO THE SKIN ONCE A WEEK   . fluticasone (FLONASE) 50 MCG/ACT nasal spray INSTILL 2 SPRAYS INTO EACH NOSTRIL twice a day   . gabapentin (NEURONTIN) 100 MG capsule Take 2 capsules (200 mg total) by mouth at bedtime.   Marland Kitchen guaiFENesin (MUCINEX) 600 MG 12 hr tablet Take 1,200 mg by mouth 2 (two) times daily.   Marland Kitchen levocetirizine (XYZAL) 5 MG tablet TAKE 1 TABLET BY MOUTH EVERY EVENING   . olopatadine (PATANOL) 0.1 % ophthalmic solution place 1 drop into both eyes twice a day (Patient taking differently: as needed. place 1 drop into both eyes twice a day)   . progesterone (PROMETRIUM) 100 MG capsule Take 1 capsule (100 mg total) by mouth every evening. TAKE 1 CAPSULE(100 MG) BY MOUTH DAILY   . zolpidem (AMBIEN) 10 MG tablet TAKE 1/2 TO 1 TABLET(5 TO 10 MG) BY MOUTH AT BEDTIME AS NEEDED FOR SLEEP   . azelastine (ASTELIN) 0.1 % nasal spray 2 (  two) times daily.  07/05/2019: Hasn't used in the past couple months  . [DISCONTINUED] EPIPEN 2-PAK 0.3 MG/0.3ML SOAJ injection Reported on 02/17/2016    No facility-administered encounter medications on file as of 07/05/2019.    Allergies  Allergen Reactions  . Codeine Itching and Nausea And Vomiting  . Pamelor [Nortriptyline] Tinitus   ROS: no fever, chills, URI symptoms.  Some allergies recently. No cough, shortness of breath. No GI complaints.  Irritability resolved after stopping testosterone.  RLS is much improved.  Moods are good. No recent heartburn (resolved with use of PPI).   See HPI  PHYSICAL EXAM:  BP 128/72   Pulse 72   Temp 98.4 F (36.9  C) (Other (Comment))   Ht 5\' 4"  (1.626 m)   Wt 182 lb 3.2 oz (82.6 kg)   LMP 01/06/2016   BMI 31.27 kg/m   Pleasant female, in no distress HEENT: conjunctiva and sclera are clear, EOMI. OP: clear, no erythema, lesions, thrush or other abnormalities noted.  Neck: no lymphadenopathy, thyromegaly or mass Heart: regular rate and rhythm Lungs: clear bilaterally Neuro: alert and oriented, cranial nerves intact, normal gait Psych: normal mood, affect, hygiene and grooming   ASSESSMENT/PLAN:  Pain in mouth - unclear etiology, normal exam. Trial of magic mouthwash. Avoid citrus. - Plan: magic mouthwash SOLN  Seasonal allergic rhinitis, unspecified trigger - restart astelin, consider restarting allergy shots if allergies not well controlled

## 2019-07-05 NOTE — Patient Instructions (Addendum)
Try and see if any particular food triggers more discomfort. Try and avoid citrus and other acidic foods. Consider discussing further with your allergist.  Let's try Magic Mouthwash to see if this helps at all (not a great long-term fix). Try drinking more water, less soda/carbonated beverages.

## 2019-07-11 ENCOUNTER — Telehealth: Payer: Self-pay | Admitting: Family Medicine

## 2019-07-11 NOTE — Telephone Encounter (Signed)
Pt called and said she went to pick up her prescription for a mouth wash that you prescribed ans it was not there.

## 2019-07-11 NOTE — Telephone Encounter (Signed)
I checked with Verdene Lennert who was to call it in, and she said she left the prescription on their voicemail.  Maybe they didn't get it?  It was done the day of her visit.  Please contact pharmacy, and if they have no record, call it in again.  It is for magic mouthwash without lidocaine, as ordered in the chart.  Thanks

## 2019-07-11 NOTE — Telephone Encounter (Signed)
Pt was advised [facility] 

## 2019-07-11 NOTE — Telephone Encounter (Signed)
Called pharmacy and they didn't have the prescription but they are filling it. They are backed up with prescriptions so they said it may not be until tomorrow when it is filled. I called Brandi Carpenter and left her a voicemail.

## 2019-07-17 ENCOUNTER — Encounter: Payer: Self-pay | Admitting: Family Medicine

## 2019-07-17 NOTE — Telephone Encounter (Signed)
Benadryl, mylanta, carafate, and nystatin in equal parts please

## 2019-08-05 ENCOUNTER — Other Ambulatory Visit: Payer: Self-pay | Admitting: Obstetrics and Gynecology

## 2019-08-05 ENCOUNTER — Other Ambulatory Visit: Payer: Self-pay | Admitting: Family Medicine

## 2019-08-05 DIAGNOSIS — N959 Unspecified menopausal and perimenopausal disorder: Secondary | ICD-10-CM

## 2019-08-05 DIAGNOSIS — J309 Allergic rhinitis, unspecified: Secondary | ICD-10-CM

## 2019-08-07 NOTE — Telephone Encounter (Signed)
Medication refill request: Prometrium Last Office Visit :  06/12/2019 Next AEX:  Non scheduled  Last MMG (if hormonal medication request): 04/29/18 Bi-rads 1 neg  Refill authorized: #[id number] with 0 refills pended

## 2019-08-07 NOTE — Telephone Encounter (Signed)
Please schedule annual exam with me for April, 2021.

## 2019-08-07 NOTE — Telephone Encounter (Signed)
Call to patient. Patient scheduled for aex on 12-27-19 at 1000. Patient agreeable to date and time of appointment.   Encounter closed.

## 2019-08-29 ENCOUNTER — Encounter: Payer: Self-pay | Admitting: Family Medicine

## 2019-08-29 NOTE — Patient Instructions (Addendum)
  HEALTH MAINTENANCE RECOMMENDATIONS:  It is recommended that you get at least 30 minutes of aerobic exercise at least 5 days/week (for weight loss, you may need as much as 60-90 minutes). This can be any activity that gets your heart rate up. This can be divided in 10-15 minute intervals if needed, but try and build up your endurance at least once a week.  Weight bearing exercise is also recommended twice weekly.  Eat a healthy diet with lots of vegetables, fruits and fiber.  "Colorful" foods have a lot of vitamins (ie green vegetables, tomatoes, red peppers, etc).  Limit sweet tea, regular sodas and alcoholic beverages, all of which has a lot of calories and sugar.  Up to 1 alcoholic drink daily may be beneficial for women (unless trying to lose weight, watch sugars).  Drink a lot of water.  Calcium recommendations are 1200-1500 mg daily (1500 mg for postmenopausal women or women without ovaries), and vitamin D 1000 IU daily.  This should be obtained from diet and/or supplements (vitamins), and calcium should not be taken all at once, but in divided doses.  Monthly self breast exams and yearly mammograms for women over the age of 70 is recommended.  Sunscreen of at least SPF 30 should be used on all sun-exposed parts of the skin when outside between the hours of 10 am and 4 pm (not just when at beach or pool, but even with exercise, golf, tennis, and yard work!)  Use a sunscreen that says "broad spectrum" so it covers both UVA and UVB rays, and make sure to reapply every 1-2 hours.  Remember to change the batteries in your smoke detectors when changing your clock times in the spring and fall. Carbon monoxide detectors are recommended for your home.  Use your seat belt every time you are in a car, and please drive safely and not be distracted with cell phones and texting while driving.  [facility] to [facility] your yearly mamm[facility].

## 2019-08-29 NOTE — Progress Notes (Signed)
Chief Complaint  Patient presents with  . Annual Exam    fasting annual exam no GYN exam, see Dr. Quincy Simmonds. No new concerns.     Brandi Carpenter is a 53 y.o. female who presents for a complete physical.   Postmenopausal symptoms: on climara and prometrium from GYN.  Hot flashes, memory concerns and insomnia improved.  Irritability resolved after stopping the testosterone cream. She still has some insomnia, requiring use of 1/2 tablet of ambien most nights (used to use less often).  Denies any side effects and it is effective. Tried CR back in 2019, but she felt groggy in the mornings.   Patient had been seeing Dr. Charlann Boxer for her back pain.  Hasn't been seen since 10/2018.  At that time he recommended repeat epidural, and recommended increasing gabapentin to 300mg  qHS. If no benefit, they discussed neurosurgical referral. 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 Dr. Tamala Julian also had treated her for R lateral epicondylitis.   She went for PT, and ultimately had lumbar disc surgery in 10/2018, and back pain resolved.  She reports back is  "doing great" She is still getting PT (mainly for back) through the end of the year.  She had stopped the gabapentin after surgery, and noted that her restless legs got worse (see 04/2019 visit).  She restarted at 200mg  qHS and RLS has been much better.  Depression/Anxiety: Moods are stable.Denies any side effectsfrom wellbutrin or citalopram.  Last refilled alprazolam #20 on 04/12/2019.  Uses it mainly for [facility], needed yesterday.  Allergies: Under the care of allergist.  She stopped her allergy shots earlier this year, and so far is still doing well.  H/o headaches and migraines--Resolved (occasional sinus headache).  Improved after sinus and neck surgeries, and no longer has jaw clenching contributing to headaches.   Immunization History  Administered Date(s) Administered  . Influenza Split 07/14/2014,  04/25/2016  . Influenza,inj,Quad PF,6+ Mos 06/28/2013, 08/01/2015, 06/16/2019  . Influenza-Unspecified 07/06/2017, 06/28/2018  . Tdap 05/11/2012  . Zoster 04/25/2016  . Zoster Recombinat (Shingrix) 08/25/2018, 10/26/2018   Last Pap smear:12/2018, atypical glandular cells and yeast.  She had colpo with negative biopsies, EMB showing endometrial polyp, and underwent D&C, hysteroscopy and Myosure. Last mammogram:04/2018 (needs to r/s, missed her appt) Last colonoscopy:09/2015 with Dr. Ardis Hughs, normal. Last DEXA: never  Dentist: twice a year Ophtho:a few years ago, wears readers Exercise: elliptical (15 mins) and weights 3x/week at PT Lipids: Lab Results  Component Value Date   CHOL 221 (H) 08/30/2017   HDL 69 08/30/2017   [place] 127 (H) 08/30/2017   TRIG 134 08/30/2017   CHOLHDL 3.2 08/30/2017   Vitamin D-OH 67.5 in 04/2018  PMH, PSH, [place] and FH reviewed and updated  Outpatient Encounter Medications as of 08/30/2019  Medication Sig Note  . ALPRAZolam (XANAX) 0.5 MG tablet TAKE 1/2 TO 1 TABLET BY MOUTH THREE TIMES DAILY AS NEEDED FOR ANXIETY 08/30/2019: Took yesterday for dental visit (root canal); otherwise uses rarely  . azelastine (ASTELIN) 0.1 % nasal spray 2 (two) times daily.    Marland Kitchen buPROPion (WELLBUTRIN XL) 300 MG 24 hr tablet TAKE 1 TABLET BY MOUTH EVERY MORNING   . Cholecalciferol (VITAMIN D) 2000 UNITS tablet Take 2,000 Units by mouth daily.   . citalopram (CELEXA) 20 MG tablet TAKE 1 TABLET BY MOUTH ONCE DAILY (Patient taking differently: every evening. take 1 tablet by mouth once daily)   . estradiol (CLIMARA - DOSED IN MG/24 HR)  0.05 mg/24hr patch APPLY 1 PATCH ONTO THE SKIN ONCE A WEEK   . fluticasone (FLONASE) 50 MCG/ACT nasal spray INSTILL 2 SPRAYS INTO EACH NOSTRIL twice a day   . gabapentin (NEURONTIN) 100 MG capsule Take 2 capsules (200 mg total) by mouth at bedtime.   Marland Kitchen guaiFENesin (MUCINEX) 600 MG 12 hr tablet Take 1,200 mg by mouth 2 (two) times daily.   Marland Kitchen  levocetirizine (XYZAL) 5 MG tablet TAKE 1 TABLET BY MOUTH EVERY EVENING   . olopatadine (PATANOL) 0.1 % ophthalmic solution place 1 drop into both eyes twice a day (Patient taking differently: as needed. place 1 drop into both eyes twice a day)   . progesterone (PROMETRIUM) 100 MG capsule TAKE 1 CAPSULE(100 MG) BY MOUTH EVERY EVENING   . zolpidem (AMBIEN) 10 MG tablet TAKE 1/2 TO 1 TABLET(5 TO 10 MG) BY MOUTH AT BEDTIME AS NEEDED FOR SLEEP 08/30/2019: Takes 1/2 tablet most nights  . magic mouthwash SOLN Take 5 mLs by mouth 4 (four) times daily as needed for mouth pain. (Patient not taking: Reported on 08/30/2019)    No facility-administered encounter medications on file as of 08/30/2019.   Allergies  Allergen Reactions  . Codeine Itching and Nausea And Vomiting  . Pamelor [Nortriptyline] Tinitus    ROS: The patient denies anorexia, fever, vision changes, decreased hearing, ear pain, sore throat, breast concerns, chest pain, palpitations, dizziness, syncope, dyspnea on exertion, cough,swelling, nausea, vomiting, diarrhea, constipation, abdominal pain, melena, hematochezia, indigestion/heartburn, hematuria, incontinence, dysuria, vaginal bleeding, discharge, odor or itch, genital lesions, weakness, tremor, suspicious skin lesions, abnormal bleeding/bruising, or enlarged lymph nodes.  Headaches are very infrequent Insomnia, per HPI, most days, needing ambien, which is effective Depression/anxiety controlled Hot flashes resolved with HRT Mouth pain resolved after using magic mouthwash. No further vaginal bleeding Back and elbow pain resolved.   PHYSICAL EXAM:  BP 112/72   Pulse 80   Temp 98 F (36.7 C) (Other (Comment))   Ht 5\' 5"  (1.651 m)   Wt 180 lb 12.8 oz (82 kg)   LMP 01/06/2016   BMI 30.09 kg/m   Wt Readings from Last 3 Encounters:  08/30/19 180 lb 12.8 oz (82 kg)  07/05/19 182 lb 3.2 oz (82.6 kg)  04/19/19 183 lb (83 kg)     General Appearance:  Alert, cooperative,  no distress, appears stated age. She did not change into gown for exam today  Head:  Normocephalic, without obvious abnormality, atraumatic   Eyes:  PERRL, conjunctiva/corneas clear, EOM's intact, fundi benign   Ears:  Normal TM's and external ear canals..  Nose:  Not examined, wearing mask due to COVID-19 pandemic   Throat:  Not examined, wearing mask due to COVID-19 pandemic  Neck:  Supple, no lymphadenopathy; thyroid: no enlargement/ tenderness/nodules; no carotid bruit or JVD   Back:  Spine nontender, no curvature, ROM normal, no CVA tenderness   Lungs:  Clear to auscultation bilaterally without wheezes, rales or ronchi; respirations unlabored   Chest Wall:  No tenderness or deformity   Heart:  Regular rate and rhythm, S1 and S2 normal, no murmur, rub or gallop   Breast Exam:  Deferred to GYN  Abdomen:  Soft, non-tender, nondistended, normoactive bowel sounds, no masses, no hepatosplenomegaly   Genitalia:  Deferred to GYN   Rectal:  Deferred to GYN  Extremities:  No clubbing, cyanosis or edema. R lateral epicondyle nontender  Pulses:  2+ and symmetric all extremities   Skin:  Skin color, texture, turgor normal, no rashes.  Lymph nodes:  Cervical, supraclavicular, and axillary nodes normal   Neurologic:  Normal strength, sensation and gait; reflexes 2+ and symmetric throughout    Psych: Normal mood, affect, hygiene and grooming   PHQ-9 score of 2 (trouble sleeping)   ASSESSMENT/PLAN:   Annual physical exam - Plan: Comprehensive metabolic panel, Lipid panel  Restless leg syndrome - controlled with gabapentin  Depression, major, in remission ([place]) - cont wellbutrin and citalopram  Insomnia, unspecified type - cont ambien prn  Seasonal allergic rhinitis, unspecified trigger - controlled  Postmenopausal symptoms - controlled with HRT, from GYN   Has f/u GYN 12/2019  Discussed monthly self breast  exams and yearly mammograms; at least 30 minutes of aerobic activity at least 5 days/week, weight-bearing exercise at least 2x/wk; proper sunscreen use reviewed; healthy diet, including goals of calcium and vitamin D intake and alcohol recommendations (less than or equal to 1 drink/day) reviewed; regular seatbelt use; changing batteries in smoke detectors. Immunization recommendations discussed--UTD, continue yearly flu shots.  Colonoscopy recommendations reviewed--UTD  F/u 1 year CPE

## 2019-08-30 ENCOUNTER — Other Ambulatory Visit: Payer: Self-pay

## 2019-08-30 ENCOUNTER — Ambulatory Visit: Payer: BC Managed Care – PPO | Admitting: Family Medicine

## 2019-08-30 ENCOUNTER — Encounter: Payer: Self-pay | Admitting: Family Medicine

## 2019-08-30 VITALS — BP 112/72 | HR 80 | Temp 98.0°F | Ht 65.0 in | Wt 180.8 lb

## 2019-08-30 DIAGNOSIS — G2581 Restless legs syndrome: Secondary | ICD-10-CM | POA: Diagnosis not present

## 2019-08-30 DIAGNOSIS — G4700 Insomnia, unspecified: Secondary | ICD-10-CM

## 2019-08-30 DIAGNOSIS — N959 Unspecified menopausal and perimenopausal disorder: Secondary | ICD-10-CM

## 2019-08-30 DIAGNOSIS — J302 Other seasonal allergic rhinitis: Secondary | ICD-10-CM

## 2019-08-30 DIAGNOSIS — F325 Major depressive disorder, single episode, in full remission: Secondary | ICD-10-CM | POA: Diagnosis not present

## 2019-08-30 DIAGNOSIS — Z0000 Encounter for general adult medical examination without abnormal findings: Secondary | ICD-10-CM | POA: Diagnosis not present

## 2019-08-31 LAB — COMPREHENSIVE METABOLIC PANEL
ALT: 27 IU/L (ref 0–32)
AST: 25 IU/L (ref 0–40)
Albumin/Globulin Ratio: 1.8 (ref 1.2–2.2)
Albumin: 4.4 g/dL (ref 3.8–4.9)
Alkaline Phosphatase: 75 IU/L (ref 39–117)
BUN/Creatinine Ratio: 10 (ref 9–23)
BUN: 10 mg/dL (ref 6–24)
Bilirubin Total: 0.7 mg/dL (ref 0.0–1.2)
CO2: 24 mmol/L (ref 20–29)
Calcium: 9.4 mg/dL (ref 8.7–10.2)
Chloride: 102 mmol/L (ref 96–106)
Creatinine, Ser: 0.99 mg/dL (ref 0.57–1.00)
GFR calc Af Amer: 75 mL/min/{1.73_m2} (ref >59)
GFR calc non Af Amer: 65 mL/min/{1.73_m2} (ref >59)
Globulin, Total: 2.4 g/dL (ref 1.5–4.5)
Glucose: 92 mg/dL (ref 65–99)
Potassium: 5 mmol/L (ref 3.5–5.2)
Sodium: 137 mmol/L (ref 134–144)
Total Protein: 6.8 g/dL (ref 6.0–8.5)

## 2019-08-31 LAB — LIPID PANEL
Chol/HDL Ratio: 3.2 ratio (ref 0.0–4.4)
Cholesterol, Total: 206 mg/dL — ABNORMAL HIGH (ref 100–199)
HDL: 64 mg/dL (ref >39)
LDL Chol Calc (NIH): 122 mg/dL — ABNORMAL HIGH (ref 0–99)
Triglycerides: 111 mg/dL (ref 0–149)
VLDL Cholesterol Cal: 20 mg/dL (ref 5–40)

## 2019-09-05 ENCOUNTER — Other Ambulatory Visit: Payer: Self-pay | Admitting: Family Medicine

## 2019-09-05 DIAGNOSIS — J309 Allergic rhinitis, unspecified: Secondary | ICD-10-CM

## 2019-10-05 ENCOUNTER — Other Ambulatory Visit: Payer: Self-pay | Admitting: Family Medicine

## 2019-10-05 DIAGNOSIS — J309 Allergic rhinitis, unspecified: Secondary | ICD-10-CM

## 2019-10-05 DIAGNOSIS — F325 Major depressive disorder, single episode, in full remission: Secondary | ICD-10-CM

## 2019-10-05 DIAGNOSIS — G2581 Restless legs syndrome: Secondary | ICD-10-CM

## 2019-10-05 NOTE — Telephone Encounter (Signed)
Are these okay to refill? 

## 2019-10-11 ENCOUNTER — Telehealth: Payer: Self-pay | Admitting: Family Medicine

## 2019-10-11 NOTE — Telephone Encounter (Signed)
Requested records received from [facility] 

## 2019-10-15 ENCOUNTER — Other Ambulatory Visit: Payer: Self-pay | Admitting: Family Medicine

## 2019-10-15 DIAGNOSIS — G4700 Insomnia, unspecified: Secondary | ICD-10-CM

## 2019-10-16 NOTE — Telephone Encounter (Signed)
Is this okay to refill? 

## 2019-10-18 ENCOUNTER — Encounter: Payer: Self-pay | Admitting: *Deleted

## 2019-10-23 ENCOUNTER — Other Ambulatory Visit: Payer: Self-pay

## 2019-10-23 DIAGNOSIS — N959 Unspecified menopausal and perimenopausal disorder: Secondary | ICD-10-CM

## 2019-10-23 MED ORDER — PROGESTERONE MICRONIZED 100 MG PO CAPS: ORAL_CAPSULE | ORAL | 0 refills | Status: DC

## 2019-10-23 NOTE — Telephone Encounter (Signed)
Please let patient know that she needs to update her mammogram.

## 2019-10-23 NOTE — Telephone Encounter (Signed)
Spoke with patient and advised she needs to update mammogram for further refills on HRT. Patient states she will call to make an appointment.

## 2019-10-23 NOTE — Telephone Encounter (Signed)
Received fax for following refill:  Medication refill request: Progesterone 100mg  #90,R0 Last AEX:  Last office visit 06-12-19 Next AEX: 12-27-19 Last MMG (if hormonal medication request): 04-29-18 Neg/Birads1 Refill authorized: please authorize if appropriate

## 2019-10-24 ENCOUNTER — Encounter: Payer: Self-pay | Admitting: Family Medicine

## 2019-11-13 ENCOUNTER — Other Ambulatory Visit: Payer: Self-pay | Admitting: Family Medicine

## 2019-11-13 DIAGNOSIS — Z1231 Encounter for screening mammogram for malignant neoplasm of breast: Secondary | ICD-10-CM

## 2019-11-14 ENCOUNTER — Ambulatory Visit
Admission: RE | Admit: 2019-11-14 | Discharge: 2019-11-14 | Disposition: A | Discharge status: Home / Self Care | Payer: BC Managed Care – PPO | Source: Ambulatory Visit

## 2019-11-14 ENCOUNTER — Other Ambulatory Visit: Payer: Self-pay

## 2019-11-14 ENCOUNTER — Other Ambulatory Visit: Payer: Self-pay | Admitting: *Deleted

## 2019-11-14 DIAGNOSIS — N959 Unspecified menopausal and perimenopausal disorder: Secondary | ICD-10-CM

## 2019-11-14 DIAGNOSIS — Z1231 Encounter for screening mammogram for malignant neoplasm of breast: Secondary | ICD-10-CM

## 2019-11-14 NOTE — Telephone Encounter (Signed)
Medication refill request: Progesterone  Last AEX:  12-26-2018 BS Next AEX: 12-27-19 Last MMG (if hormonal medication request): 04-29-18 BIRADS 1 negative, scheduled for today at [facility] Refill authorized: Please advise.   Medication pended for #30, 0RF. Please refill if appropriate.

## 2019-11-15 MED ORDER — PROGESTERONE MICRONIZED 100 MG PO CAPS: ORAL_CAPSULE | ORAL | 1 refills | Status: DC

## 2019-11-17 ENCOUNTER — Ambulatory Visit: Payer: BC Managed Care – PPO | Attending: Internal Medicine

## 2019-11-17 DIAGNOSIS — Z20822 Contact with and (suspected) exposure to covid-19: Secondary | ICD-10-CM

## 2019-11-18 LAB — NOVEL CORONAVIRUS, NAA: SARS-CoV-2, NAA: NOT DETECTED

## 2019-11-23 ENCOUNTER — Other Ambulatory Visit: Payer: Self-pay

## 2019-11-23 ENCOUNTER — Encounter: Payer: Self-pay | Admitting: Family Medicine

## 2019-11-23 ENCOUNTER — Ambulatory Visit (INDEPENDENT_AMBULATORY_CARE_PROVIDER_SITE_OTHER): Payer: BC Managed Care – PPO | Admitting: Family Medicine

## 2019-11-23 VITALS — BP 110/71 | HR 73 | Ht 65.0 in | Wt 178.0 lb

## 2019-11-23 DIAGNOSIS — R0602 Shortness of breath: Secondary | ICD-10-CM

## 2019-11-23 DIAGNOSIS — J302 Other seasonal allergic rhinitis: Secondary | ICD-10-CM | POA: Diagnosis not present

## 2019-11-23 MED ORDER — ALBUTEROL SULFATE HFA 108 (90 BASE) MCG/ACT IN AERS: 2.0000 | INHALATION_SPRAY | Freq: Four times a day (QID) | RESPIRATORY_TRACT | 0 refills | Status: DC | PRN

## 2019-11-23 MED ORDER — METHYLPREDNISOLONE 4 MG PO TBPK: ORAL_TABLET | ORAL | 0 refills | Status: DC

## 2019-11-23 NOTE — Patient Instructions (Signed)
It sounds as though your allergies may be the culprit for your symptoms. Thanks for getting the COVID test--that was also on my list of possibilities.   Other viruses would be improving by now, or you'd be feeling worse if you developed a bacterial infection.  Stay well hydrated. Continue all of your regular allergy medications, including the mucinex, and using the sudafed if needed.  We had discussed using a course of steroids, but decide to delay that due to you getting your J&J COVID vaccine today--I'd prefer you to hold off on the steroids, if possible, so that it doesn't effect the efficacy of the vaccine.  We sent the prescription to the pharmacy, so start the steroids if you are feeling worse rather than noticing any improvement over the next few days. We also sent in the prescription for the albuterol inhaler, as we discussed.  This might help with some of your chest tightness and shortness of breath.  Use it about 15 minutes prior to exercise, and as needed for shortness of breath.  Let us know if you develop fever, discolored mucus or phlegm, chest pain, worsening shortness of breath, pain with breathing, leg swelling or calf pain, or any other different or worsening symptoms.  I suspect it may be your regular seasonal allergies starting, without having your allergy shots on board.  I'd follow up with your allergist in the next week or two if you aren't feeling better despite these treatments.

## 2019-11-23 NOTE — Progress Notes (Signed)
Start time: 2pm End time: 2:20   Virtual Visit via Video Note  I connected with Brandi Carpenter on 11/23/19 at  2:00 PM EST by a video enabled telemedicine application and verified that I am speaking with the correct person using two identifiers.  Location: Patient: home Provider: office   I discussed the limitations of evaluation and management by telemedicine and the availability of in person appointments. The patient expressed understanding and agreed to proceed.  History of Present Illness:  Chief Complaint  Patient presents with  . Sore Throat    fatigue,congestion, SOB, feels like her throat is swollen, jittery. Felt light headed last week and had to sit down. All symptoms started last Monday. No fevers. No cough just throat clearing/congestion. Would have gone to allergist but he is out of town this week Brandi Carpenter).   10 days ago she started with sore throat, congestion.  Thought she was getting a cold.  Then had some diarrhea x 1 day.  She has a heaviness in her chest, hard to get a good breath.  She had been exercising, but it was too exhausting, wiped her out. By Friday, she wasn't feeling any better, so went and got COVID test, which was negative. No known exposures.  In addition to her regular allergy meds and mucinex, she has taken some sudafed. Overall not much improvement.  No fever or chills. No discolored mucus or phlegm. No sinus pain (some headaches last week).  Stopped allergy shots about a year ago, mostly due to COVID, and had been down to q3-4 weeks, wearing off.  Got J&J COVID vaccine today at [facility], [place], [place] reviewed  Outpatient Encounter Medications as of 11/23/2019  Medication Sig Note  . azelastine (ASTELIN) 0.1 % nasal spray 2 (two) times daily.    Marland Kitchen buPROPion (WELLBUTRIN XL) 300 MG 24 hr tablet TAKE 1 TABLET BY MOUTH EVERY MORNING   . cetirizine (ZYRTEC) 10 MG tablet Take 10 mg by mouth daily.   . Cholecalciferol (VITAMIN D) 2000 UNITS  tablet Take 2,000 Units by mouth daily.   . citalopram (CELEXA) 20 MG tablet TAKE 1 TABLET BY MOUTH ONCE DAILY   . estradiol (CLIMARA - DOSED IN MG/24 HR) 0.05 mg/24hr patch APPLY 1 PATCH ONTO THE SKIN ONCE A WEEK   . fluticasone (FLONASE) 50 MCG/ACT nasal spray INSTILL 2 SPRAYS INTO EACH NOSTRIL twice a day   . gabapentin (NEURONTIN) 100 MG capsule TAKE 2 CAPSULES(200 MG) BY MOUTH AT BEDTIME   . guaiFENesin (MUCINEX) 600 MG 12 hr tablet Take 1,200 mg by mouth 2 (two) times daily.   Marland Kitchen levocetirizine (XYZAL) 5 MG tablet TAKE 1 TABLET BY MOUTH EVERY EVENING   . progesterone (PROMETRIUM) 100 MG capsule TAKE 1 CAPSULE(100 MG) BY MOUTH EVERY EVENING   . zolpidem (AMBIEN) 10 MG tablet TAKE 1/2 TO 1 TABLET(5 TO 10 MG) BY MOUTH AT BEDTIME AS NEEDED FOR SLEEP   . albuterol (VENTOLIN HFA) 108 (90 Base) MCG/ACT inhaler Inhale 2 puffs into the lungs every 6 (six) hours as needed for wheezing or shortness of breath.   . ALPRAZolam (XANAX) 0.5 MG tablet TAKE 1/2 TO 1 TABLET BY MOUTH THREE TIMES DAILY AS NEEDED FOR ANXIETY (Patient not taking: Reported on 11/23/2019) 08/30/2019: Took yesterday for dental visit (root canal); otherwise uses rarely  . methylPREDNISolone (MEDROL DOSEPAK) 4 MG TBPK tablet Take as directed   . olopatadine (PATANOL) 0.1 % ophthalmic solution place 1 drop into both eyes twice a  day (Patient not taking: Reported on 11/23/2019)   . [DISCONTINUED] magic mouthwash SOLN Take 5 mLs by mouth 4 (four) times daily as needed for mouth pain. (Patient not taking: Reported on 08/30/2019)    No facility-administered encounter medications on file as of 11/23/2019.   Allergies  Allergen Reactions  . Codeine Itching and Nausea And Vomiting  . Pamelor [Nortriptyline] Tinitus   ROS:  As detailed in HPI   Observations/Objective:  BP 110/71   Pulse 73   Ht 5\' 5"  (1.651 m)   Wt 178 lb (80.7 kg)   LMP 01/06/2016   BMI 29.62 kg/m   Well-appearing, pleasant female, in no distress. She is alert,  oriented, normal mood, affect, grooming. She is speaking easily, in full sentences, in no distress HEENT conjunctiva and sclera are clear, EOMI Exam is limited due to virtual nature of the visit.  Assessment and Plan:  Seasonal allergic rhinitis, unspecified trigger - likely a component to her symptoms, since not improving or worsening, has h/o allergies, not currently on immunotherapy - Plan: methylPREDNISolone (MEDROL DOSEPAK) 4 MG TBPK tablet  Shortness of breath - suspect related to allergies.  Hold off on course of prednisone due to her getting COVID vaccine today; start with trial of albuterol prn - Plan: methylPREDNISolone (MEDROL DOSEPAK) 4 MG TBPK tablet, albuterol (VENTOLIN HFA) 108 (90 Base) MCG/ACT inhaler   Follow Up Instructions:    I discussed the assessment and treatment plan with the patient. The patient was provided an opportunity to ask questions and all were answered. The patient agreed with the plan and demonstrated an understanding of the instructions.   The patient was advised to call back or seek an in-person evaluation if the symptoms worsen or if the condition fails to improve as anticipated.  I provided 20 minutes of video face-to-face time during this encounter. Additional time spent in chart review and documentation.   Vikki Ports, MD

## 2019-12-12 ENCOUNTER — Other Ambulatory Visit: Payer: Self-pay | Admitting: Family Medicine

## 2019-12-12 DIAGNOSIS — G4700 Insomnia, unspecified: Secondary | ICD-10-CM

## 2019-12-13 NOTE — Telephone Encounter (Signed)
Is this okay to refill? 

## 2019-12-21 ENCOUNTER — Telehealth: Payer: Self-pay | Admitting: *Deleted

## 2019-12-21 ENCOUNTER — Other Ambulatory Visit: Payer: Self-pay | Admitting: Family Medicine

## 2019-12-21 DIAGNOSIS — R0602 Shortness of breath: Secondary | ICD-10-CM

## 2019-12-21 NOTE — Telephone Encounter (Signed)
Called into [facility] [address] and phoned in 137ml mouthwash : 74ml nystatin suspension 25ml carafate 30ml benadryl 27ml [place]

## 2019-12-21 NOTE — Telephone Encounter (Signed)
Albuterol refill sent. Looked in chart for mouthwash. She has gotten both Magic Mouthwash and Nystatin in the past.  Not sure which she is referring to.  Okay to refill

## 2019-12-21 NOTE — Telephone Encounter (Signed)
Patient did request this refill, is helping with her breathing and the pollen is really bad right now. She is going to call Dr. Harold Hedge and schedule. He was vacation when she last called. She also asked if we could call in the mouthwash again-her mouth is falring up again.

## 2019-12-27 ENCOUNTER — Ambulatory Visit: Payer: BC Managed Care – PPO | Admitting: Obstetrics and Gynecology

## 2020-01-10 ENCOUNTER — Other Ambulatory Visit: Payer: Self-pay | Admitting: Family Medicine

## 2020-01-10 DIAGNOSIS — J309 Allergic rhinitis, unspecified: Secondary | ICD-10-CM

## 2020-01-11 NOTE — Telephone Encounter (Signed)
Is this okay to refill? 

## 2020-01-16 ENCOUNTER — Other Ambulatory Visit: Payer: Self-pay

## 2020-01-17 ENCOUNTER — Other Ambulatory Visit (HOSPITAL_COMMUNITY)
Admission: RE | Admit: 2020-01-17 | Discharge: 2020-01-17 | Disposition: A | Discharge status: Home / Self Care | Payer: BC Managed Care – PPO | Source: Ambulatory Visit | Attending: Obstetrics and Gynecology | Admitting: Obstetrics and Gynecology

## 2020-01-17 ENCOUNTER — Other Ambulatory Visit: Payer: Self-pay | Admitting: Obstetrics and Gynecology

## 2020-01-17 ENCOUNTER — Encounter: Payer: Self-pay | Admitting: Obstetrics and Gynecology

## 2020-01-17 ENCOUNTER — Ambulatory Visit: Payer: BC Managed Care – PPO | Admitting: Obstetrics and Gynecology

## 2020-01-17 VITALS — BP 128/80 | HR 72 | Temp 97.3°F | Resp 10 | Ht 66.0 in | Wt 179.0 lb

## 2020-01-17 DIAGNOSIS — Z01419 Encounter for gynecological examination (general) (routine) without abnormal findings: Secondary | ICD-10-CM | POA: Diagnosis not present

## 2020-01-17 DIAGNOSIS — N950 Postmenopausal bleeding: Secondary | ICD-10-CM | POA: Diagnosis not present

## 2020-01-17 DIAGNOSIS — N959 Unspecified menopausal and perimenopausal disorder: Secondary | ICD-10-CM

## 2020-01-17 MED ORDER — ESTRADIOL 0.5 MG PO TABS
0.5000 mg | ORAL_TABLET | Freq: Every day | ORAL | 0 refills | Status: DC
Start: 2020-01-17 — End: 2020-03-21

## 2020-01-17 MED ORDER — PROGESTERONE MICRONIZED 100 MG PO CAPS
100.0000 mg | ORAL_CAPSULE | Freq: Every day | ORAL | 0 refills | Status: DC
Start: 2020-01-17 — End: 2020-03-19

## 2020-01-17 NOTE — Progress Notes (Signed)
54 y.o. G0P0000 Domestic Partner Caucasian female here for annual exam. Patient states that she has had some bleeding and cramping within the last couple of months.   Bled for about 5 days in a row and may be been cyclic. This occurred about 2 -3 times.  The patch is not staying on well. Noticing increased sweating at night.   She wants to continue on her HRT. She is going to run out.  She uses Climara patch and changes it weekly and takes Prometrium daily.  Patient is status post hysteroscopy with excision of benign endocervical and endometrial polyps on 01/23/19.   She is on HRT.   She is having mouth irritation.  Magic mouthwash is not working as well now.  She will see her PCP.  Will return to [facility] to work on site in 2 months.  Received her Covid vaccine.   PCP: Rita Ohara, MD    Patient's last menstrual period was 01/06/2016.           Sexually active: No.  The current method of family planning is post menopausal status.    Exercising: No.  The patient does not participate in regular exercise at present. Smoker:  no  Health Maintenance: Pap:  12-26-2018 atypical glandular cells HPV HR neg. Colposcopy benign ECC and benign cervical biopsy.  08/12/16 Neg:Neg HR HPV  08/01/15 Neg:Neg HR HPV History of abnormal Pap:  yes MMG:  11/14/19 BIRADS 1 negative/density c Colonoscopy:  09/27/15 Normal f/u 10 years BMD:   never  Result  n/a TDaP:  05/11/12 Gardasil:   n/a HIV: never Hep C: never Screening Labs:  PCP   reports that she has never smoked. She has never used smokeless tobacco. She reports that she does not drink alcohol or use drugs.  Past Medical History:  Diagnosis Date  . Abnormal Pap smear    ASCUS 08/1999; CIN 11/1999, s/p conization, 12-26-2018 AGUS  . Allergic rhinitis, cause unspecified   . Anxiety   . Complication of anesthesia    "difficulty awakening" remote past  . DDD (degenerative disc disease), cervical 05/05/2018   Noted on imaging  . DDD  (degenerative disc disease), lumbar 06/08/2018   Noted on imaging  . Depression   . Endometrial polyp   . Headache   . Insomnia   . PMB (postmenopausal bleeding)   . 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 Stenosis, cervical spine 05/05/2018   Noted on imaging  . Vitamin D deficiency     Past Surgical History:  Procedure Laterality Date  . ANKLE SURGERY  child   left for torn ligaments, left  . ANTERIOR CERVICAL DECOMP/DISCECTOMY FUSION N/A 01/27/2016   Procedure: C5-6, C6-7 Anterior Cervical Discectomy and Fusion, Allograft, Plate;  Surgeon: Marybelle Killings, MD;  Location: [place];  Service: Orthopedics;  Laterality: N/A;  . CERVICAL CONIZATION W/BX  2001   Laser conization - Dr. Rhodia Albright - [facility] - Moderate to severe dysplasia, negative margins  . COLONOSCOPY  09/2015  . DILATATION & CURETTAGE/HYSTEROSCOPY WITH MYOSURE N/A 01/23/2019   Procedure: DILATATION & CURETTAGE/HYSTEROSCOPY WITH MYOSURE;  Surgeon: Nunzio Cobbs, MD;  Location: [facility];  Service: Gynecology;  Laterality: N/A;  . LUMBAR [facility] SURGERY  10/2018  . LUMBAR MICRODISCECTOMY Right 11/09/2018   lumbar level:L5-S1 Utilizing microscope-Dr.Gary Cram  . NASAL SEPTOPLASTY W/ TURBINOPLASTY Bilateral 10/07/2015   Procedure: NASAL SEPTOPLASTY WITH BILATERAL TURBINATE REDUCTION;  Surgeon: Leta Baptist, MD;  Location: [facility]  [place] [facility];  Service: ENT;  Laterality: Bilateral;  . PILONIDAL CYST EXCISION    . WISDOM TOOTH EXTRACTION      Current Outpatient Medications  Medication Sig Dispense Refill  . ALPRAZolam (XANAX) 0.5 MG tablet TAKE 1/2 TO 1 TABLET BY MOUTH THREE TIMES DAILY AS NEEDED FOR ANXIETY 20 tablet 0  . azelastine (ASTELIN) 0.1 % nasal spray 2 (two) times daily.     Marland Kitchen buPROPion (WELLBUTRIN XL) 300 MG 24 hr tablet TAKE 1 TABLET BY MOUTH EVERY MORNING 90 tablet 3  . cetirizine (ZYRTEC) 10 MG tablet Take 10 mg by mouth daily.    . Cholecalciferol (VITAMIN D) 2000  UNITS tablet Take 2,000 Units by mouth daily.    . citalopram (CELEXA) 20 MG tablet TAKE 1 TABLET BY MOUTH ONCE DAILY 90 tablet 3  . estradiol (CLIMARA - DOSED IN MG/24 HR) 0.05 mg/24hr patch APPLY 1 PATCH ONTO THE SKIN ONCE A WEEK 12 patch 3  . fluticasone (FLONASE) 50 MCG/ACT nasal spray INSTILL 2 SPRAYS INTO EACH NOSTRIL twice a day  0  . gabapentin (NEURONTIN) 100 MG capsule TAKE 2 CAPSULES(200 MG) BY MOUTH AT BEDTIME 180 capsule 1  . guaiFENesin (MUCINEX) 600 MG 12 hr tablet Take 1,200 mg by mouth 2 (two) times daily.    Marland Kitchen levocetirizine (XYZAL) 5 MG tablet TAKE 1 TABLET BY MOUTH EVERY EVENING 90 tablet 3  . montelukast (SINGULAIR) 10 MG tablet SMARTSIG:1 Tablet(s) By Mouth Every Evening    . olopatadine (PATANOL) 0.1 % ophthalmic solution INSTILL 1 DROP IN BOTH EYES TWICE DAILY 5 mL 5  . progesterone (PROMETRIUM) 100 MG capsule TAKE 1 CAPSULE(100 MG) BY MOUTH EVERY EVENING 30 capsule 1  . zolpidem (AMBIEN) 10 MG tablet TAKE 1/2 TO 1 TABLET(5 TO 10 MG) BY MOUTH AT BEDTIME AS NEEDED FOR SLEEP 30 tablet 1  . albuterol (VENTOLIN HFA) 108 (90 Base) MCG/ACT inhaler INHALE 2 PUFFS INTO THE LUNGS EVERY 6 HOURS AS NEEDED FOR WHEEZING OR SHORTNESS OF BREATH (Patient not taking: Reported on 01/17/2020) 18 g 0   No current facility-administered medications for this visit.    Family History  Problem Relation Age of Onset  . Hypertension Mother   . Diabetes Mother   . Heart disease Father   . Depression Sister   . Breast cancer Cousin   . Colon cancer Maternal Grandmother         died at 61  . Diabetes Maternal Grandmother   . Alzheimer's disease Paternal Grandmother   . Colon polyps Maternal Uncle   . Migraines Neg Hx     Review of Systems  Constitutional:       Night sweats  HENT: Negative.   Eyes: Negative.   Respiratory: Negative.   Cardiovascular: Negative.   Gastrointestinal: Negative.   Endocrine: Negative.   Genitourinary: Negative.   Musculoskeletal: Negative.   Skin:  Negative.   Allergic/Immunologic: Negative.   Neurological: Negative.   Hematological: Negative.   Psychiatric/Behavioral: Negative.     Exam:   BP 128/80 (BP Location: Left Arm, Patient Position: Sitting, Cuff Size: Normal)   Pulse 72   Temp (!) 97.3 F (36.3 C) (Temporal)   Resp 10   Ht 5\' 6"  (1.676 m)   Wt 179 lb (81.2 kg)   LMP 01/06/2016   BMI 28.89 kg/m     General appearance: alert, cooperative and appears stated age Head: normocephalic, without obvious abnormality, atraumatic Neck: no adenopathy, supple, symmetrical, trachea midline and thyroid normal to  inspection and palpation Lungs: clear to auscultation bilaterally Breasts: normal appearance, no masses or tenderness, No nipple retraction or dimpling, No nipple discharge or bleeding, No axillary adenopathy Heart: regular rate and rhythm Abdomen: soft, non-tender; no masses, no organomegaly Extremities: extremities normal, atraumatic, no cyanosis or edema Skin: skin color, texture, turgor normal. No rashes or lesions Lymph nodes: cervical, supraclavicular, and axillary nodes normal. Neurologic: grossly normal  Pelvic: External genitalia:  no lesions              No abnormal inguinal nodes palpated.              Urethra:  normal appearing urethra with no masses, tenderness or lesions              Bartholins and Skenes: normal                 Vagina: normal appearing vagina with normal color and discharge, no lesions              Cervix: no lesions              Pap taken: Yes.   Bimanual Exam:  Uterus:  normal size, contour, position, consistency, mobility, non-tender              Adnexa: no mass, fullness, tenderness              Rectal exam: Yes.  .  Confirms.              Anus:  normal sphincter tone, no lesions  Chaperone was present for exam.  Assessment:   Well woman visit with normal exam. Hx recent AGUS pap and normal colpo.  Hx prior CIN 3.  Status post conization.  Hx postmenopausal bleeding with  final pathology showing benign polyp of cervix and endometrium. HRT.   Plan: Mammogram screening discussed. Self breast awareness reviewed. Pap and HR HPV as above. Guidelines for Calcium, Vitamin D, regular exercise program including cardiovascular and weight bearing exercise. Switch to oral estrace 0.5 mg daily.  #90, RF none.  Will refill Prometrium 100 mg daily.  #90, RF none.  Return for sonohysterogram and possible EMB. Follow up annually and prn.   After visit summary provided.

## 2020-01-17 NOTE — Patient Instructions (Signed)

## 2020-01-18 ENCOUNTER — Other Ambulatory Visit: Payer: Self-pay

## 2020-01-18 ENCOUNTER — Other Ambulatory Visit: Payer: Self-pay | Admitting: Family Medicine

## 2020-01-18 ENCOUNTER — Ambulatory Visit: Payer: BC Managed Care – PPO | Admitting: Family Medicine

## 2020-01-18 ENCOUNTER — Other Ambulatory Visit: Payer: Self-pay | Admitting: *Deleted

## 2020-01-18 ENCOUNTER — Encounter: Payer: Self-pay | Admitting: Family Medicine

## 2020-01-18 VITALS — BP 120/70 | HR 72 | Temp 97.1°F | Ht 66.0 in | Wt 181.6 lb

## 2020-01-18 DIAGNOSIS — K1379 Other lesions of oral mucosa: Secondary | ICD-10-CM | POA: Diagnosis not present

## 2020-01-18 MED ORDER — MAGIC MOUTHWASH: ORAL | 0 refills | Status: DC

## 2020-01-18 MED ORDER — LIDOCAINE VISCOUS HCL 2 % MT SOLN: 15.0000 mL | OROMUCOSAL | 0 refills | Status: DC | PRN

## 2020-01-18 NOTE — Patient Instructions (Addendum)
  Cut back on oranges, eat less citrus fruit as snacks. Let's do a trial of stopping the Astelin for a week or two and see if things improve. Continue the flonase and your other allergy medications. Use viscous lidocaine when very painful/irritated.  Be sure to use caution with using this and eating/drinking, as you are at higher risk for choking when the throat/mouth are numb from the medication.   It is okay to continue/re-try the [employer]; call the pharmacy to initiate the refill.  If you have ongoing problems, you can either discuss with your dentist, or we can refer you to an ENT.  [facility] and Dr. Mirna Mires are a couple of dentists I recommend.

## 2020-01-18 NOTE — Progress Notes (Signed)
Chief Complaint  Patient presents with  . Oral Pain    and some lesions. Some days they are worse than others. Used the mouthwash that she has used in the past last month and it didn't help. Really wants to figure out what is going on. Is having allergy testing in July with Dr.Van Creed Copper.    Problems on/off for a couple of years with mouth soreness. She still has red spots that come and go on the roof of her mouth, like a raw feeling. She describes "rawness" on the back and sides of her tongue, as well as the roof of her mouth.  She wonders if it could be from the nasal spray? Denies canker sores.   Didn't really discuss with dentist.  Magic mouthwash--helped when she used it in November. The most recent time (over the past week), it hasn't helped much.  Got a new refill, finished it, didn't seem to help as much.  Uses Flonase for years.  Astelin is used only seasonally. B12 level normal 01/2019  Eats a lot of oranges. Pineapple makes worse +sodas--stopped them for a while in the Fall, to see if it helped, but it didn't  She is seeing Dr. Harold Hedge, scheduled for repeat skin tests in July, and plans to restart allergy shots.  Allergies are flaring some.  Her mouth discomfort tends to have a seasonal component.  Mouth pain has been flaring for the last month.  Last big flare of pain was back in the Fall Having occasional heartburn, using generic OTC Nexium once daily, for the last 2 weeks. Mouth unchanged, heartburn is controlled.  PMH, PSH, SH reviewed  Outpatient Encounter Medications as of 01/18/2020  Medication Sig  . azelastine (ASTELIN) 0.1 % nasal spray 2 (two) times daily.   Marland Kitchen buPROPion (WELLBUTRIN XL) 300 MG 24 hr tablet TAKE 1 TABLET BY MOUTH EVERY MORNING  . cetirizine (ZYRTEC) 10 MG tablet Take 10 mg by mouth daily.  . Cholecalciferol (VITAMIN D) 2000 UNITS tablet Take 2,000 Units by mouth daily.  . citalopram (CELEXA) 20 MG tablet TAKE 1 TABLET BY MOUTH ONCE DAILY  .  esomeprazole (NEXIUM) 20 MG capsule Take 20 mg by mouth daily at 12 noon.  Marland Kitchen estradiol (ESTRACE) 0.5 MG tablet Take 1 tablet (0.5 mg total) by mouth daily.  . fluticasone (FLONASE) 50 MCG/ACT nasal spray INSTILL 2 SPRAYS INTO EACH NOSTRIL twice a day  . gabapentin (NEURONTIN) 100 MG capsule TAKE 2 CAPSULES(200 MG) BY MOUTH AT BEDTIME  . guaiFENesin (MUCINEX) 600 MG 12 hr tablet Take 1,200 mg by mouth 2 (two) times daily.  Marland Kitchen levocetirizine (XYZAL) 5 MG tablet TAKE 1 TABLET BY MOUTH EVERY EVENING  . montelukast (SINGULAIR) 10 MG tablet SMARTSIG:1 Tablet(s) By Mouth Every Evening  . progesterone (PROMETRIUM) 100 MG capsule Take 1 capsule (100 mg total) by mouth daily.  Marland Kitchen zolpidem (AMBIEN) 10 MG tablet TAKE 1/2 TO 1 TABLET(5 TO 10 MG) BY MOUTH AT BEDTIME AS NEEDED FOR SLEEP  . albuterol (VENTOLIN HFA) 108 (90 Base) MCG/ACT inhaler INHALE 2 PUFFS INTO THE LUNGS EVERY 6 HOURS AS NEEDED FOR WHEEZING OR SHORTNESS OF BREATH (Patient not taking: Reported on 01/17/2020)  . ALPRAZolam (XANAX) 0.5 MG tablet TAKE 1/2 TO 1 TABLET BY MOUTH THREE TIMES DAILY AS NEEDED FOR ANXIETY (Patient not taking: Reported on 01/18/2020)  . lidocaine (XYLOCAINE) 2 % solution Use as directed 15 mLs in the mouth or throat every 4 (four) hours as needed for mouth pain.  Marland Kitchen  olopatadine (PATANOL) 0.1 % ophthalmic solution INSTILL 1 DROP IN BOTH EYES TWICE DAILY (Patient not taking: Reported on 01/18/2020)   No facility-administered encounter medications on file as of 01/18/2020.   (NOT using viscous lidocaine prior to today's visit)  Allergies  Allergen Reactions  . Codeine Itching and Nausea And Vomiting  . Pamelor [Nortriptyline] Tinitus    ROS:  +allergies, flaring x last month, along with mouth pain.  Denies fever, chills, other URI symptoms. Denies cough, shortness of breath, GI complaints, rashes or other concerns   PHYSICAL EXAM:  BP 120/70   Pulse 72   Temp (!) 97.1 F (36.2 C) (Other (Comment))   Ht 5\' 6"  (1.676 m)    Wt 181 lb 9.6 oz (82.4 kg)   LMP 01/06/2016   BMI 29.31 kg/m   HEENT: conjunctiva and sclera are clear, EOMI.   OP: Upper palate, more on the L than R, there are some scattered small papules and erythema. No ulcerations or swelling noted. Tongue is geographic but no focal lesions or erythema. Neck : no lymphadenopathy  ASSESSMENT/PLAN:  Mouth pain - seems to be seasonal, consider poss from Astelin, will stop using it. Supportive measures. Consider ENT if not improving - Plan: lidocaine (XYLOCAINE) 2 % solution   Cut back on oranges, eat less citrus fruit as snacks. Let's do a trial of stopping the Astelin for a week or two and see if things improve. Continue the flonase and your other allergy medications. Use viscous lidocaine when very painful/irritated.  Be sure to use caution with using this and eating/drinking, as you are at higher risk for choking when the throat/mouth are numb from the medication.   It is okay to continue/re-try the [employer]; call the pharmacy to initiate the refill.  If you have ongoing problems, you can either discuss with your dentist, or we can refer you to an ENT.

## 2020-01-19 LAB — CYTOLOGY - PAP
Comment: NEGATIVE
Diagnosis: NEGATIVE
High risk HPV: NEGATIVE

## 2020-01-31 ENCOUNTER — Other Ambulatory Visit: Payer: Self-pay

## 2020-02-01 ENCOUNTER — Encounter: Payer: Self-pay | Admitting: Obstetrics and Gynecology

## 2020-02-01 ENCOUNTER — Other Ambulatory Visit (HOSPITAL_COMMUNITY)
Admission: RE | Admit: 2020-02-01 | Discharge: 2020-02-01 | Disposition: A | Discharge status: Home / Self Care | Payer: BC Managed Care – PPO | Source: Ambulatory Visit | Attending: Obstetrics and Gynecology | Admitting: Obstetrics and Gynecology

## 2020-02-01 ENCOUNTER — Ambulatory Visit: Payer: BC Managed Care – PPO | Admitting: Obstetrics and Gynecology

## 2020-02-01 ENCOUNTER — Ambulatory Visit (INDEPENDENT_AMBULATORY_CARE_PROVIDER_SITE_OTHER): Payer: BC Managed Care – PPO

## 2020-02-01 ENCOUNTER — Other Ambulatory Visit: Payer: Self-pay | Admitting: Obstetrics and Gynecology

## 2020-02-01 VITALS — BP 128/82 | HR 66 | Temp 97.7°F | Ht 66.0 in | Wt 179.2 lb

## 2020-02-01 DIAGNOSIS — Z79890 Hormone replacement therapy (postmenopausal): Secondary | ICD-10-CM

## 2020-02-01 DIAGNOSIS — N950 Postmenopausal bleeding: Secondary | ICD-10-CM

## 2020-02-01 NOTE — Patient Instructions (Signed)

## 2020-02-01 NOTE — Progress Notes (Signed)
GYNECOLOGY  VISIT   HPI: 54 y.o.   Domestic Partner  Caucasian  female   [place] with Patient's last menstrual period was 01/06/2016.   here for sonohysterogram and EMB. Here also for follow up on HRT.  She had some bleeding on her HRT.  Her patch was peeling off.  She takes daily Prometrium.   She was switched to oral estrogen and he flashes are improved but somewhat still there.  She is on her new regimen for about 2 weeks.   No further bleeding.   GYNECOLOGIC HISTORY: Patient's last menstrual period was 01/06/2016. Contraception: PMP Menopausal hormone therapy: Estradiol and Prometrium Last mammogram: 11/14/19 BIRADS 1 negative/density c Last pap smear: 12-26-2018 atypical glandular cells HPV HR neg. Colposcopy benign ECC and benign cervical biopsy.             08/12/16 Neg:Neg HR HPV             08/01/15 Neg:Neg HR HPV        OB History    Gravida  0   Para  0   Term  0   Preterm  0   AB  0   Living  0     SAB  0   TAB  0   Ectopic  0   Multiple  0   Live Births                 Patient Active Problem List   Diagnosis Date Noted  . Atypical glandular cells of undetermined significance (AGUS) on cervical Pap smear 01/08/2019  . Endometrial polyp 01/08/2019  . Postmenopausal bleeding 12/26/2018  . Lateral epicondylitis of right elbow 07/12/2018  . Lumbar radiculopathy, right 07/12/2018  . Piriformis syndrome of right side 06/08/2018  . Status post cervical spinal fusion 01/27/2016  . Headache 05/13/2015  . Blurry vision 05/13/2015  . Perceived hearing changes 05/13/2015  . Migraine headache 04/29/2015  . Raynaud phenomenon 08/28/2013  . Depression, major, in remission ([place]) 08/28/2013  . Allergic conjunctivitis 05/09/2013  . Anxiety 03/09/2012  . Insomnia 03/09/2012  . Allergic rhinitis 03/09/2012    Past Medical History:  Diagnosis Date  . Abnormal Pap smear    ASCUS 08/1999; CIN 11/1999, s/p conization, 12-26-2018 AGUS  . Allergic  rhinitis, cause unspecified   . Anxiety   . Complication of anesthesia    "difficulty awakening" remote past  . DDD (degenerative disc disease), cervical 05/05/2018   Noted on imaging  . DDD (degenerative disc disease), lumbar 06/08/2018   Noted on imaging  . Depression   . Endometrial polyp   . Headache   . Insomnia   . PMB (postmenopausal bleeding)   . 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 Stenosis, cervical spine 05/05/2018   Noted on imaging  . Vitamin D deficiency     Past Surgical History:  Procedure Laterality Date  . ANKLE SURGERY  child   left for torn ligaments, left  . ANTERIOR CERVICAL DECOMP/DISCECTOMY FUSION N/A 01/27/2016   Procedure: C5-6, C6-7 Anterior Cervical Discectomy and Fusion, Allograft, Plate;  Surgeon: Marybelle Killings, MD;  Location: [place];  Service: Orthopedics;  Laterality: N/A;  . CERVICAL CONIZATION W/BX  2001   Laser conization - Dr. Rhodia Albright - wake [facility] - Moderate to severe dysplasia, negative margins  . COLONOSCOPY  09/2015  . DILATATION & CURETTAGE/HYSTEROSCOPY WITH MYOSURE N/A 01/23/2019   Procedure: DILATATION & CURETTAGE/HYSTEROSCOPY WITH MYOSURE;  Surgeon: Yisroel Ramming, [name],  MD;  Location: [place];  Service: Gynecology;  Laterality: N/A;  . LUMBAR [place] SURGERY  10/2018  . LUMBAR MICRODISCECTOMY Right 11/09/2018   lumbar level:L5-S1 Utilizing microscope-Dr.Gary Cram  . NASAL SEPTOPLASTY W/ TURBINOPLASTY Bilateral 10/07/2015   Procedure: NASAL SEPTOPLASTY WITH BILATERAL TURBINATE REDUCTION;  Surgeon: Leta Baptist, MD;  Location: [place];  Service: ENT;  Laterality: Bilateral;  . PILONIDAL CYST EXCISION    . WISDOM TOOTH EXTRACTION      Current Outpatient Medications  Medication Sig Dispense Refill  . albuterol (VENTOLIN HFA) 108 (90 Base) MCG/ACT inhaler INHALE 2 PUFFS INTO THE LUNGS EVERY 6 HOURS AS NEEDED FOR WHEEZING OR SHORTNESS OF BREATH 18 g 0  . ALPRAZolam (XANAX) 0.5 MG  tablet TAKE 1/2 TO 1 TABLET BY MOUTH THREE TIMES DAILY AS NEEDED FOR ANXIETY 20 tablet 0  . azelastine (ASTELIN) 0.1 % nasal spray 2 (two) times daily.     Marland Kitchen buPROPion (WELLBUTRIN XL) 300 MG 24 hr tablet TAKE 1 TABLET BY MOUTH EVERY MORNING 90 tablet 3  . cetirizine (ZYRTEC) 10 MG tablet Take 10 mg by mouth daily.    . Cholecalciferol (VITAMIN D) 2000 UNITS tablet Take 2,000 Units by mouth daily.    . citalopram (CELEXA) 20 MG tablet TAKE 1 TABLET BY MOUTH ONCE DAILY 90 tablet 3  . esomeprazole (NEXIUM) 20 MG capsule Take 20 mg by mouth daily at 12 noon.    Marland Kitchen estradiol (ESTRACE) 0.5 MG tablet Take 1 tablet (0.5 mg total) by mouth daily. 90 tablet 0  . fluticasone (FLONASE) 50 MCG/ACT nasal spray INSTILL 2 SPRAYS INTO EACH NOSTRIL twice a day  0  . gabapentin (NEURONTIN) 100 MG capsule TAKE 2 CAPSULES(200 MG) BY MOUTH AT BEDTIME 180 capsule 1  . guaiFENesin (MUCINEX) 600 MG 12 hr tablet Take 1,200 mg by mouth 2 (two) times daily.    Marland Kitchen levocetirizine (XYZAL) 5 MG tablet TAKE 1 TABLET BY MOUTH EVERY EVENING 90 tablet 3  . lidocaine (XYLOCAINE) 2 % solution Use as directed 15 mLs in the mouth or throat every 4 (four) hours as needed for mouth pain. 100 mL 0  . magic mouthwash SOLN 62ml and spit out 4 times daily as needed 160 mL 0  . montelukast (SINGULAIR) 10 MG tablet SMARTSIG:1 Tablet(s) By Mouth Every Evening    . olopatadine (PATANOL) 0.1 % ophthalmic solution INSTILL 1 DROP IN BOTH EYES TWICE DAILY 5 mL 5  . progesterone (PROMETRIUM) 100 MG capsule Take 1 capsule (100 mg total) by mouth daily. 90 capsule 0  . zolpidem (AMBIEN) 10 MG tablet TAKE 1/2 TO 1 TABLET(5 TO 10 MG) BY MOUTH AT BEDTIME AS NEEDED FOR SLEEP 30 tablet 1   No current facility-administered medications for this visit.     ALLERGIES: Codeine and Pamelor [nortriptyline]  Family History  Problem Relation Age of Onset  . Hypertension Mother   . Diabetes Mother   . Heart disease Father   . Depression Sister   . Breast  cancer Cousin   . Colon cancer Maternal Grandmother         died at 37  . Diabetes Maternal Grandmother   . Alzheimer's disease Paternal Grandmother   . Colon polyps Maternal Uncle   . Migraines Neg Hx     Social History   Socioeconomic History  . Marital status: Soil scientist    Spouse name: Not on file  . Number of children: 0  . Years of education: 37  .  Highest education level: Not on file  Occupational History  . Occupation: Naval architect: [employer]  Tobacco Use  . Smoking status: Never Smoker  . Smokeless tobacco: Never Used  Substance and Sexual Activity  . Alcohol use: No    Alcohol/week: 0.0 standard drinks  . Drug use: Never  . Sexual activity: Not Currently    Partners: Female    Birth control/protection: Abstinence, Post-menopausal  Other Topics Concern  . Not on file  Social History Narrative   Lives with female partner Juliann Pulse), 2 beagles   Social Determinants of Health   Financial Resource Strain:   . Difficulty of Paying Living Expenses:   Food Insecurity:   . Worried About Charity fundraiser in the Last Year:   . Arboriculturist in the Last Year:   Transportation Needs:   . Film/video editor (Medical):   Marland Kitchen Lack of Transportation (Non-Medical):   Physical Activity:   . Days of Exercise per Week:   . Minutes of Exercise per Session:   Stress:   . Feeling of Stress :   Social Connections:   . Frequency of Communication with Friends and Family:   . Frequency of Social Gatherings with Friends and Family:   . Attends Religious Services:   . Active Member of Clubs or Organizations:   . Attends Archivist Meetings:   Marland Kitchen Marital Status:   Intimate Partner Violence:   . Fear of Current or Ex-Partner:   . Emotionally Abused:   Marland Kitchen Physically Abused:   . Sexually Abused:     Review of Systems  All other systems reviewed and are negative.   PHYSICAL EXAMINATION:    BP 128/82 (Cuff Size: Large)   Pulse 66    Temp 97.7 F (36.5 C) (Temporal)   Ht 5\' 6"  (1.676 m)   Wt 179 lb 3.2 oz (81.3 kg)   LMP 01/06/2016   BMI 28.92 kg/m     General appearance: alert, cooperative and appears stated age  Pelvic US Uterus with no masses.  EMS 4.58 mm.  Bilateral ovaries each with a small follicular cyst.  No free fluid.   Sonhysterogram and EMB Consent for procedures.  Hibiclens prep.  Tenaculum to anterior cervix.  Catheter placed inside uterine cavity.  Sterile NS injected.  No filling defect noted.  Reprep of cervix with Hibiclens.  Pipelle placed to almost 8 cm x 2.  Tissue to pathology.  Minimal EBL.  No complications.   Chaperone was present for exam.  ASSESSMENT  Postmenopausal bleeding on HRT.  Hx endometrial polyp.  Hx AGUS pap.  Completed evaluation and had hysteroscopy showing benign polyp.  FU pap normal and negative HR HPV.   PLAN  Pelvic US finding reviewed including report and images.  FU EMB.  Post procedure instructions and precautions given.  Continue HRT.  We discussed the possibility of increasing her oral estrogen use. Call for recurrent bleeding or if menopausal symptoms are not well controlled. Fu cervical cancer screening 12 months post annual exam.    An After Visit Summary was printed and given to the patient.  _20_____ minutes face to face time of which over 50% was spent in counseling.

## 2020-02-02 LAB — SURGICAL PATHOLOGY

## 2020-02-06 ENCOUNTER — Telehealth: Payer: Self-pay | Admitting: Obstetrics and Gynecology

## 2020-02-06 NOTE — Telephone Encounter (Signed)
Patient is having bloating and puffiness and wonders if it is related to her new medication?

## 2020-02-07 NOTE — Telephone Encounter (Signed)
Spoke with patient. Patient has been taking estradiol 0.5mg  PO daily and Prometrium 100 mg daily for the past 3 wks. Patient states she changed from estradiol patch due to bleeding and patch not staying in place. Patient states for the past week she has felt "swollen, puffy and bloated". Reports swelling in hands and in her face. No other changes in meds, food or products. Still experiencing some sweating at night. Denies SHOB, wheezing, difficulty breathing, pain or bleeding.   Patient takes medication at night. Advised not to take anymore medication at this time, will review with Dr. Quincy Simmonds and f/u with recommendations. ER precautions provided for new or worsening symptoms. Patient agreeable.   Dr. Quincy Simmonds -please review and advise.

## 2020-02-07 NOTE — Telephone Encounter (Signed)
Call to patient, left detailed message, ok per dpr, name identified on voicemail. Advised as seen below per Dr. Quincy Simmonds. Return call to office if any additional questions. F/u once symptoms resolve to discuss alternative Rx.   Encounter closed.

## 2020-02-07 NOTE — Telephone Encounter (Signed)
I agree with stopping her oral estrogen and Prometrium and seeing if her symptoms resolve.  She may have been getting a lot less estrogen from the patch with it peeling off, and the oral estrogen may be too much dosage for her.   If her swelling does not resolve, I recommend she see her PCP.    We can put her back on a patch at a much lower dosage, but it may have different cost related to it.

## 2020-02-16 ENCOUNTER — Other Ambulatory Visit: Payer: Self-pay | Admitting: Neurosurgery

## 2020-02-16 DIAGNOSIS — M47816 Spondylosis without myelopathy or radiculopathy, lumbar region: Secondary | ICD-10-CM

## 2020-02-21 ENCOUNTER — Other Ambulatory Visit: Payer: Self-pay | Admitting: Family Medicine

## 2020-02-21 DIAGNOSIS — F411 Generalized anxiety disorder: Secondary | ICD-10-CM

## 2020-02-22 NOTE — Telephone Encounter (Signed)
Is this okay to refill? 

## 2020-02-22 NOTE — Telephone Encounter (Signed)
Last filled in July 2020

## 2020-03-18 ENCOUNTER — Other Ambulatory Visit: Payer: Self-pay | Admitting: Family Medicine

## 2020-03-18 ENCOUNTER — Other Ambulatory Visit: Payer: Self-pay | Admitting: Obstetrics and Gynecology

## 2020-03-18 DIAGNOSIS — G2581 Restless legs syndrome: Secondary | ICD-10-CM

## 2020-03-19 ENCOUNTER — Other Ambulatory Visit: Payer: Self-pay

## 2020-03-19 ENCOUNTER — Ambulatory Visit
Admission: RE | Admit: 2020-03-19 | Discharge: 2020-03-19 | Disposition: A | Discharge status: Home / Self Care | Payer: BC Managed Care – PPO | Source: Ambulatory Visit | Attending: Neurosurgery | Admitting: Neurosurgery

## 2020-03-19 DIAGNOSIS — M47816 Spondylosis without myelopathy or radiculopathy, lumbar region: Secondary | ICD-10-CM

## 2020-03-19 MED ORDER — GADOBENATE DIMEGLUMINE 529 MG/ML IV SOLN
17.0000 mL | Freq: Once | INTRAVENOUS | Status: AC | PRN
  Administered 2020-03-19: 17 mL via INTRAVENOUS

## 2020-03-19 NOTE — Telephone Encounter (Signed)
Medication refill request: Progesterone   Last AEX:  01/17/20 Next AEX: 02/05/21 Last MMG (if hormonal medication request): 11/15/19 Bi-rads 1 neg  Refill authorized: #90 with 3 RF

## 2020-03-21 ENCOUNTER — Telehealth: Payer: Self-pay

## 2020-03-21 MED ORDER — MINIVELLE 0.0375 MG/24HR TD PTTW: 1.0000 | MEDICATED_PATCH | TRANSDERMAL | 2 refills | Status: DC

## 2020-03-21 NOTE — Telephone Encounter (Signed)
Spoke with pt. Pt given update and recommendations per Dr Quincy Simmonds. Pt agreeable to use Minivelle patch and Prometrium. Pt states has enough Prometrium at this time, was just refilled on 03/19/20. Only Minivelle patch sent to pharmacy. Pt to return call with any questions or concerns. Pt scheduled for 3 month recheck on 06/19/2020 at 1130 am. Pt agreeable and verbalized understanding of date and time of appt.   Routing to Dr Quincy Simmonds.  Encounter closed.

## 2020-03-21 NOTE — Telephone Encounter (Signed)
Spoke with pt. Pt states having issues with Estrace pill. Pt states still having swollen, puffy, bloated in hands, feet and abd area since using HRT. Pt states has not been seen by PCP. Pt states stopped Estrace x 1 week ago. Pt is still taking Progesterone every day.  Pt having hot flashes and mood swings again. Pt given recommendations per Dr. Quincy Simmonds on phone encounter 02/06/20:  Nunzio Cobbs, MD Note I agree with stopping her oral estrogen and Prometrium and seeing if her symptoms resolve.  She may have been getting a lot less estrogen from the patch with it peeling off, and the oral estrogen may be too much dosage for her.   If her swelling does not resolve, I recommend she see her PCP.    We can put her back on a patch at a much lower dosage, but it may have different cost related to it.      Pt agreeable to retry lower dosage patch that is brand and see if stays on. Pt states patch did work well in the beginning. Advised will review with Dr Quincy Simmonds and return call to pt. Pt agreeable.   Routing to Dr Quincy Simmonds.

## 2020-03-21 NOTE — Telephone Encounter (Signed)
Patient called to discuss medication issues with nurse.

## 2020-03-21 NOTE — Telephone Encounter (Signed)
Ok for Minivelle 0.0375 mg transdermal patch to skin twice weekly.  Dispense:  4, RF:  2. She will need to take Prometrium 100 mg q hs.  Dispense:  30, RF:  2  Please have her see me for a recheck appointment in 3 months.

## 2020-03-23 MED ORDER — MINIVELLE 0.0375 MG/24HR TD PTTW: 1.0000 | MEDICATED_PATCH | TRANSDERMAL | 2 refills | Status: DC

## 2020-03-23 NOTE — Addendum Note (Signed)
Addended by: Yisroel Ramming, Dietrich Pates E on: 03/23/2020 07:06 PM   Modules accepted: Orders

## 2020-03-26 ENCOUNTER — Telehealth: Payer: Self-pay | Admitting: Obstetrics and Gynecology

## 2020-03-26 NOTE — Telephone Encounter (Addendum)
Spoke with pt. Pt states Minivelle patch was not approved by insurance. Pt states is cost prohibitive. See previous encounter dated 03/21/20. Pt advised can submit PA to see if will be approved. Pt agreeable.   PA submitted through [place] CASE ID [id number]  Pt aware that it might take up to 3 days for approval or denial. Pt agreeable. Pt states started retaking estradiol pills x 2 weeks ago, but still having hot flashes.

## 2020-03-26 NOTE — Telephone Encounter (Signed)
Patient would like to speak with nurse about medication and insurance not covering.

## 2020-04-01 ENCOUNTER — Other Ambulatory Visit: Payer: Self-pay | Admitting: Neurosurgery

## 2020-04-01 DIAGNOSIS — M47816 Spondylosis without myelopathy or radiculopathy, lumbar region: Secondary | ICD-10-CM

## 2020-04-01 NOTE — Telephone Encounter (Addendum)
Left message to call Sharee Pimple, RN at [facility].   PA denied for Minivelle  Covered alternatives are estradiol tablets, Divigel, Evamist Lyllana, Dotti, Alora, estradiol

## 2020-04-02 NOTE — Telephone Encounter (Signed)
Spoke with patient, advised of PA response. Patient would like to try an alternative.  Patient has tried estradiol patch in the past, adhesive did not stick well, not effective. Does not want to try vaginal estradiol tablets.   Patient states the pharmacy notified her today that Prometrium 100 mg caps also requires PA.   Advised I can review alternative with Dr. Quincy Simmonds. Can then submit PA for progesterone. Patient states she is not out of medication.   Dr. Quincy Simmonds -please review and provide alternative to [facility] 0.0375mg  patch.

## 2020-04-02 NOTE — Telephone Encounter (Signed)
Patient is returning call.  °

## 2020-04-03 ENCOUNTER — Other Ambulatory Visit: Payer: Self-pay | Admitting: Obstetrics and Gynecology

## 2020-04-03 ENCOUNTER — Ambulatory Visit
Admission: RE | Admit: 2020-04-03 | Discharge: 2020-04-03 | Disposition: A | Discharge status: Home / Self Care | Payer: BC Managed Care – PPO | Source: Ambulatory Visit | Attending: Neurosurgery | Admitting: Neurosurgery

## 2020-04-03 DIAGNOSIS — M47816 Spondylosis without myelopathy or radiculopathy, lumbar region: Secondary | ICD-10-CM

## 2020-04-03 MED ORDER — METHYLPREDNISOLONE ACETATE 40 MG/ML INJ SUSP (RADIOLOG
120.0000 mg | Freq: Once | INTRAMUSCULAR | Status: AC
  Administered 2020-04-03: 120 mg via INTRA_ARTICULAR

## 2020-04-03 MED ORDER — IOPAMIDOL (ISOVUE-M 200) INJECTION 41%
1.0000 mL | Freq: Once | INTRAMUSCULAR | Status: AC
  Administered 2020-04-03: 1 mL via INTRA_ARTICULAR

## 2020-04-03 MED ORDER — ESTRADIOL 0.0375 MG/24HR TD PTTW: 1.0000 | MEDICATED_PATCH | TRANSDERMAL | 2 refills | Status: DC

## 2020-04-03 NOTE — Telephone Encounter (Signed)
I switched her from [place] to Brandi Carpenter 0.0375 mg patch twice weekly.   I sent this to her pharmacy with refills until her follow up in October.   Please do PA for the Prometrium.

## 2020-04-03 NOTE — Telephone Encounter (Signed)
Spoke with patient. Advised per Dr. Quincy Simmonds. Patient will contact the pharmacy to review Rx out of pocket cost, is aware to return call to office if any additional assistance needed. Patient reports she has been on Prometrium since 04/2018. PA submitted via [email or web address], will notify patient of response once received. Patient agreeable. \  PA for Prometrium 100 mg caps  Key: [id number] - PA Case ID: [id number] - Rx #: [id number]

## 2020-04-03 NOTE — Telephone Encounter (Signed)
[facility]  received a request from your provider for coverage of Progesterone 100MG  OR CAPS. The request was denied because: Your plan approved Prometrium (FA-PA) criteria covers this drug when you have a contraindication to all the alternatives, or if complete and valid documentation is provided of you trying and having an inadequate treatment response or intolerance to the required number of formulary alternatives. Your use of this drug does not meet the requirement. This is based on the information that was provided. Formulary alternatives are: medroxyprogesterone; progesterone (micronized). Requirement: Trial and failure of 3 or more in a class with at least 3 alternatives, 2 in a class with 2 alternatives, or 1 in a class with only 1 alternative.   Call returned to patient. Left detailed message on mobile number, ok per dpr. Advised PA denied for brand Prometrium. Generic progesterone 100 mg caps covered by plan. Reviewed with Dr. Quincy Simmonds, progesterone is an appropriate alternative. F/u with pharmacy for filling, return call to office if any additional questions.   Routing to provider for final review. Patient is agreeable to disposition. Will close encounter.

## 2020-04-03 NOTE — Discharge Instructions (Signed)

## 2020-04-08 ENCOUNTER — Encounter: Payer: Self-pay | Admitting: Family Medicine

## 2020-04-22 ENCOUNTER — Other Ambulatory Visit: Payer: Self-pay | Admitting: Obstetrics and Gynecology

## 2020-04-22 DIAGNOSIS — Z79890 Hormone replacement therapy (postmenopausal): Secondary | ICD-10-CM

## 2020-04-22 NOTE — Telephone Encounter (Signed)
Medication refill request: Progesterone 100mg   Last AEX:  01/17/20 Next AEX: 02/05/21 Last MMG (if hormonal medication request): 11/14/19  Normal  Refill authorized: 90/3

## 2020-05-07 ENCOUNTER — Other Ambulatory Visit: Payer: Self-pay | Admitting: Family Medicine

## 2020-05-07 DIAGNOSIS — G4700 Insomnia, unspecified: Secondary | ICD-10-CM

## 2020-05-08 NOTE — Telephone Encounter (Signed)
Is this okay to refill? 

## 2020-05-22 ENCOUNTER — Other Ambulatory Visit: Payer: Self-pay

## 2020-05-22 ENCOUNTER — Ambulatory Visit: Payer: BC Managed Care – PPO | Admitting: Family Medicine

## 2020-05-22 ENCOUNTER — Encounter: Payer: Self-pay | Admitting: Family Medicine

## 2020-05-22 VITALS — BP 138/88 | HR 84 | Temp 97.6°F | Ht 66.0 in | Wt 179.6 lb

## 2020-05-22 DIAGNOSIS — R194 Change in bowel habit: Secondary | ICD-10-CM

## 2020-05-22 DIAGNOSIS — Z23 Encounter for immunization: Secondary | ICD-10-CM | POA: Diagnosis not present

## 2020-05-22 NOTE — Patient Instructions (Signed)
Probiotic for 2-4 weeks. Avoid dairy (lactose-free) for a week or two. Look at your foods/labels to see if anything else could be contributing (artificial sweeteners).  Down the line, consider trial of gluten-free diet or low FODMAP diet.   Low-FODMAP Eating Plan  FODMAPs (fermentable oligosaccharides, disaccharides, monosaccharides, and polyols) are sugars that are hard for some people to digest. A low-FODMAP eating plan may help some people who have bowel (intestinal) diseases to manage their symptoms. This meal plan can be complicated to follow. Work with a diet and nutrition specialist (dietitian) to make a low-FODMAP eating plan that is right for you. A dietitian can make sure that you get enough nutrition from this diet. What are tips for following this plan? Reading food labels  Check labels for hidden FODMAPs such as: ? High-fructose syrup. ? Honey. ? Agave. ? Natural fruit flavors. ? Onion or garlic powder.  Choose low-FODMAP foods that contain 3-4 grams of fiber per serving.  Check food labels for serving sizes. Eat only one serving at a time to make sure FODMAP levels stay low. Meal planning  Follow a low-FODMAP eating plan for up to 6 weeks, or as told by your health care provider or dietitian.  To follow the eating plan: 1. Eliminate high-FODMAP foods from your diet completely. 2. Gradually reintroduce high-FODMAP foods into your diet one at a time. Most people should wait a few days after introducing one high-FODMAP food before they introduce the next high-FODMAP food. Your dietitian can recommend how quickly you may reintroduce foods. 3. Keep a daily record of what you eat and drink, and make note of any symptoms that you have after eating. 4. Review your daily record with a dietitian regularly. Your dietitian can help you identify which foods you can eat and which foods you should avoid. General tips  Drink enough fluid each day to keep your urine pale  yellow.  Avoid processed foods. These often have added sugar and may be high in FODMAPs.  Avoid most dairy products, whole grains, and sweeteners.  Work with a dietitian to make sure you get enough fiber in your diet. Recommended foods Grains  Gluten-free grains, such as rice, oats, buckwheat, quinoa, corn, polenta, and millet. Gluten-free pasta, bread, or cereal. Rice noodles. Corn tortillas. Vegetables  Eggplant, zucchini, cucumber, peppers, green beans, Brussels sprouts, bean sprouts, lettuce, arugula, kale, Swiss chard, spinach, collard greens, bok choy, summer squash, potato, and tomato. Limited amounts of corn, carrot, and sweet potato. Green parts of scallions. Fruits  Bananas, oranges, lemons, limes, blueberries, raspberries, strawberries, grapes, cantaloupe, honeydew melon, kiwi, papaya, passion fruit, and pineapple. Limited amounts of dried cranberries, banana chips, and shredded coconut. Dairy  Lactose-free milk, yogurt, and kefir. Lactose-free cottage cheese and ice cream. Non-dairy milks, such as almond, coconut, hemp, and rice milk. Yogurts made of non-dairy milks. Limited amounts of goat cheese, brie, mozzarella, parmesan, swiss, and other hard cheeses. Meats and other protein foods  Unseasoned beef, pork, poultry, or fish. Eggs. Berniece Salines. Tofu (firm) and tempeh. Limited amounts of nuts and seeds, such as almonds, walnuts, [place] nuts, pecans, peanuts, pumpkin seeds, chia seeds, and sunflower seeds. Fats and oils  Butter-free spreads. Vegetable oils, such as olive, canola, and sunflower oil. Seasoning and other foods  Artificial sweeteners with names that do not end in "ol" such as aspartame, saccharine, and stevia. Maple syrup, white table sugar, raw sugar, brown sugar, and molasses. Fresh basil, coriander, parsley, rosemary, and thyme. Beverages  Water and mineral water. Sugar-sweetened  soft drinks. Small amounts of orange juice or cranberry juice. Black and green tea.  Most dry wines. Coffee. This may not be a complete list of low-FODMAP foods. Talk with your dietitian for more information. Foods to avoid Grains  Wheat, including kamut, durum, and semolina. Barley and bulgur. Couscous. Wheat-based cereals. Wheat noodles, bread, crackers, and pastries. Vegetables  Chicory root, artichoke, asparagus, cabbage, snow peas, sugar snap peas, mushrooms, and cauliflower. Onions, garlic, leeks, and the white part of scallions. Fruits  Fresh, dried, and juiced forms of apple, pear, watermelon, peach, plum, cherries, apricots, blackberries, boysenberries, figs, nectarines, and mango. Avocado. Dairy  Milk, yogurt, ice cream, and soft cheese. Cream and sour cream. Milk-based sauces. Custard. Meats and other protein foods  Fried or fatty meat. Sausage. Cashews and pistachios. Soybeans, baked beans, black beans, chickpeas, kidney beans, fava beans, navy beans, lentils, and split peas. Seasoning and other foods  Any sugar-free gum or candy. Foods that contain artificial sweeteners such as sorbitol, mannitol, isomalt, or xylitol. Foods that contain honey, high-fructose corn syrup, or agave. Bouillon, vegetable stock, beef stock, and chicken stock. Garlic and onion powder. Condiments made with onion, such as hummus, chutney, pickles, relish, salad dressing, and salsa. Tomato paste. Beverages  Chicory-based drinks. Coffee substitutes. Chamomile tea. Fennel tea. Sweet or fortified wines such as port or sherry. Diet soft drinks made with isomalt, mannitol, maltitol, sorbitol, or xylitol. Apple, pear, and mango juice. Juices with high-fructose corn syrup. This may not be a complete list of high-FODMAP foods. Talk with your dietitian to discuss what dietary choices are best for you.  Summary  A low-FODMAP eating plan is a short-term diet that eliminates FODMAPs from your diet to help ease symptoms of certain bowel diseases.  The eating plan usually lasts up to 6 weeks. After  that, high-FODMAP foods are restarted gradually, one at a time, so you can find out which may be causing symptoms.  A low-FODMAP eating plan can be complicated. It is best to work with a dietitian who has experience with this type of plan. This information is not intended to replace advice given to you by your health care provider. Make sure you discuss any questions you have with your health care provider. Document Revised: 08/13/2017 Document Reviewed: 04/27/2017 Elsevier Patient Education  [employer].

## 2020-05-22 NOTE — Progress Notes (Signed)
Chief Complaint  Patient presents with  . Diarrhea    x 3.5 weeks. Has some abdominal cramping. No other symptoms. No change in diet. Has not tried any OTC medications.    Patient presents with 3.5 weeks of diarrhea with with associated lower abdominal cramping. Thought it was getting better at first, but then realized it wasn't. Denies changes to her diet. She is having 1-2 loose stools/day (color varies, green, tan, never bloody).  Not necessarily after eating or relation to food. Rarely watery. No significant bloating, or gassiness. No recent ABX, travel, sick contacts  She had changed to a generic mucinex, thought that could have been the cause, but went back to brand, without any change in her symptoms.  Back pain--improved after an injection.  Needing less ibuprofen  Restarted allergy shots in July.  PMH, PSH, SH reviewed  Outpatient Encounter Medications as of 05/22/2020  Medication Sig  . ALPRAZolam (XANAX) 0.5 MG tablet TAKE 1/2 TO 1 TABLET BY MOUTH THREE TIMES DAILY AS NEEDED FOR ANXIETY  . azelastine (ASTELIN) 0.1 % nasal spray 2 (two) times daily.   Marland Kitchen buPROPion (WELLBUTRIN XL) 300 MG 24 hr tablet TAKE 1 TABLET BY MOUTH EVERY MORNING  . cetirizine (ZYRTEC) 10 MG tablet Take 10 mg by mouth daily.  . Cholecalciferol (VITAMIN D) 2000 UNITS tablet Take 2,000 Units by mouth daily.  . citalopram (CELEXA) 20 MG tablet TAKE 1 TABLET BY MOUTH ONCE DAILY  . estradiol (DOTTI) 0.0375 MG/24HR Place 1 patch onto the skin 2 (two) times a week.  . fluticasone (FLONASE) 50 MCG/ACT nasal spray INSTILL 2 SPRAYS INTO EACH NOSTRIL twice a day  . gabapentin (NEURONTIN) 100 MG capsule TAKE 2 CAPSULES(200 MG) BY MOUTH AT BEDTIME  . guaiFENesin (MUCINEX) 600 MG 12 hr tablet Take 1,200 mg by mouth 2 (two) times daily.  Marland Kitchen levocetirizine (XYZAL) 5 MG tablet TAKE 1 TABLET BY MOUTH EVERY EVENING  . montelukast (SINGULAIR) 10 MG tablet SMARTSIG:1 Tablet(s) By Mouth Every Evening  . olopatadine (PATANOL)  0.1 % ophthalmic solution INSTILL 1 DROP IN BOTH EYES TWICE DAILY  . progesterone (PROMETRIUM) 100 MG capsule TAKE 1 CAPSULE(100 MG) BY MOUTH DAILY  . pseudoephedrine (SUDAFED) 30 MG tablet Take 30 mg by mouth every 4 (four) hours as needed for congestion.  Marland Kitchen zolpidem (AMBIEN) 10 MG tablet TAKE 1/2 TO 1 TABLET(5 TO 10 MG) BY MOUTH AT BEDTIME AS NEEDED FOR SLEEP  . albuterol (VENTOLIN HFA) 108 (90 Base) MCG/ACT inhaler INHALE 2 PUFFS INTO THE LUNGS EVERY 6 HOURS AS NEEDED FOR WHEEZING OR SHORTNESS OF BREATH (Patient not taking: Reported on 05/22/2020)  . EPINEPHrine 0.3 mg/0.3 mL IJ SOAJ injection Inject into the muscle as directed. (Patient not taking: Reported on 05/22/2020)  . esomeprazole (NEXIUM) 20 MG capsule Take 20 mg by mouth daily at 12 noon. (Patient not taking: Reported on 05/22/2020)  . lidocaine (XYLOCAINE) 2 % solution Use as directed 15 mLs in the mouth or throat every 4 (four) hours as needed for mouth pain. (Patient not taking: Reported on 05/22/2020)  . magic mouthwash SOLN 40ml and spit out 4 times daily as needed (Patient not taking: Reported on 05/22/2020)   No facility-administered encounter medications on file as of 05/22/2020.   Allergies  Allergen Reactions  . Codeine Itching and Nausea And Vomiting  . Pamelor [Nortriptyline] Tinitus   ROS: No fever, chills, nausea, vomiting, reflux. Occasional dizziness/lightheadedness--admits to drinking only 3 glasses of water, lots of [facility]. Allergies are stable. No  bleeding, bruising, rash.   See HPI   PHYSICAL EXAM:  BP 138/88   Pulse 84   Temp 97.6 F (36.4 C) (Tympanic)   Ht 5\' 6"  (1.676 m)   Wt 179 lb 9.6 oz (81.5 kg)   LMP 01/06/2016   BMI 28.99 kg/m   Wt Readings from Last 3 Encounters:  05/22/20 179 lb 9.6 oz (81.5 kg)  02/01/20 179 lb 3.2 oz (81.3 kg)  01/18/20 181 lb 9.6 oz (82.4 kg)   Pleasant, well-appearing female in no distress HEENT: conjunctiva and sclera are clear, EOMI. Wearing mask Neck: no  lymphadenopathy, thyromegaly or mass Heart: regular rate and rhythm Lungs: clear bilaterally Back: no spinal or CVA tenderness Abdomen: normal bowel sounds, soft, nontender, no mass Extremities: no edema Neuro: alert and oriented, normal gait Psych: normal mood, affect, hygiene and grooming.   ASSESSMENT/PLAN:   Change in bowel habit - loose stools with abd cramping.  Ddx reviewed.  No red flag symptoms. Trial probiotics and lactose-free.  May need gluten-free trial or low FODMAP if not better  Need for influenza vaccination - Plan: Flu Vaccine QUAD 6+ mos PF IM (Fluarix Quad PF)    Probiotic for 2-4 weeks. Avoid dairy (lactose-free) for a week or two. Look at your foods/labels to see if anything else could be contributing (artificial sweeteners).  Down the line, consider trial of gluten-free diet or low FODMAP diet.

## 2020-06-18 NOTE — Progress Notes (Signed)
GYNECOLOGY  VISIT   HPI: 54 y.o.   Domestic Partner  Caucasian  female   [place] with Patient's last menstrual period was 01/06/2016.   here for 3 month medication check.    Using Estradiol patch 0.0375 mg twice weekly and Prometrium 100 mg nightly.  No problems with patch adherence.   Patient states she is feeling like herself again. No hot flashes or sweating.   Had an EMB this spring for postmenopausal bleeding and it showed an endometrial polyp and SIS showed no filling defect.  No bleeding at this time.   Gabapentin is treating restless legs.  Received her Covid vaccine.  Received flu vaccine.   GYNECOLOGIC HISTORY: Patient's last menstrual period was 01/06/2016. Contraception: PMP Menopausal hormone therapy:  Estradiol patch and progesterone Last mammogram: 11/14/19 BIRADS 1 negative/density c  Last pap smear: 01/17/20 - Normal, neg HR HPV, 12-26-2018 atypical glandular cells HPV HR neg. Colposcopy benign ECC and benign cervical biopsy. 08/12/16 Neg:Neg HR HPV, 08/01/15 Neg:Neg HR HPV.        OB History    Gravida  0   Para  0   Term  0   Preterm  0   AB  0   Living  0     SAB  0   TAB  0   Ectopic  0   Multiple  0   Live Births                 Patient Active Problem List   Diagnosis Date Noted  . Atypical glandular cells of undetermined significance (AGUS) on cervical Pap smear 01/08/2019  . Endometrial polyp 01/08/2019  . Postmenopausal bleeding 12/26/2018  . Lateral epicondylitis of right elbow 07/12/2018  . Lumbar radiculopathy, right 07/12/2018  . Piriformis syndrome of right side 06/08/2018  . Status post cervical spinal fusion 01/27/2016  . Headache 05/13/2015  . Blurry vision 05/13/2015  . Perceived hearing changes 05/13/2015  . Migraine headache 04/29/2015  . Raynaud phenomenon 08/28/2013  . Depression, major, in remission ([place]) 08/28/2013  . Allergic conjunctivitis 05/09/2013  . Anxiety 03/09/2012  . Insomnia 03/09/2012  .  Allergic rhinitis 03/09/2012    Past Medical History:  Diagnosis Date  . Abnormal Pap smear    ASCUS 08/1999; CIN 11/1999, s/p conization, 12-26-2018 AGUS  . Allergic rhinitis, cause unspecified   . Anxiety   . Complication of anesthesia    "difficulty awakening" remote past  . DDD (degenerative disc disease), cervical 05/05/2018   Noted on imaging  . DDD (degenerative disc disease), lumbar 06/08/2018   Noted on imaging  . Depression   . Endometrial polyp   . Headache   . Insomnia   . PMB (postmenopausal bleeding)   . 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 Stenosis, cervical spine 05/05/2018   Noted on imaging  . Vitamin D deficiency     Past Surgical History:  Procedure Laterality Date  . ANKLE SURGERY  child   left for torn ligaments, left  . ANTERIOR CERVICAL DECOMP/DISCECTOMY FUSION N/A 01/27/2016   Procedure: C5-6, C6-7 Anterior Cervical Discectomy and Fusion, Allograft, Plate;  Surgeon: Marybelle Killings, MD;  Location: [facility];  Service: Orthopedics;  Laterality: N/A;  . CERVICAL CONIZATION W/BX  2001   Laser conization - Dr. Rhodia Albright - [facility] - Moderate to severe dysplasia, negative margins  . COLONOSCOPY  09/2015  . DILATATION & CURETTAGE/HYSTEROSCOPY WITH MYOSURE N/A 01/23/2019   Procedure: DILATATION & CURETTAGE/HYSTEROSCOPY WITH MYOSURE;  Surgeon: Nunzio Cobbs, MD;  Location: [facility];  Service: Gynecology;  Laterality: N/A;  . LUMBAR [place] SURGERY  10/2018  . LUMBAR MICRODISCECTOMY Right 11/09/2018   lumbar level:L5-S1 Utilizing microscope-Dr.Gary Cram  . NASAL SEPTOPLASTY W/ TURBINOPLASTY Bilateral 10/07/2015   Procedure: NASAL SEPTOPLASTY WITH BILATERAL TURBINATE REDUCTION;  Surgeon: Leta Baptist, MD;  Location: [facility];  Service: ENT;  Laterality: Bilateral;  . PILONIDAL CYST EXCISION    . WISDOM TOOTH EXTRACTION      Current Outpatient Medications  Medication Sig Dispense Refill  . ALPRAZolam  (XANAX) 0.5 MG tablet TAKE 1/2 TO 1 TABLET BY MOUTH THREE TIMES DAILY AS NEEDED FOR ANXIETY 20 tablet 0  . azelastine (ASTELIN) 0.1 % nasal spray 2 (two) times daily.     Marland Kitchen buPROPion (WELLBUTRIN XL) 300 MG 24 hr tablet TAKE 1 TABLET BY MOUTH EVERY MORNING 90 tablet 3  . cetirizine (ZYRTEC) 10 MG tablet Take 10 mg by mouth daily.    . Cholecalciferol (VITAMIN D) 2000 UNITS tablet Take 2,000 Units by mouth daily.    . citalopram (CELEXA) 20 MG tablet TAKE 1 TABLET BY MOUTH ONCE DAILY 90 tablet 3  . EPINEPHrine 0.3 mg/0.3 mL IJ SOAJ injection Inject into the muscle as directed.     Marland Kitchen esomeprazole (NEXIUM) 20 MG capsule Take 20 mg by mouth daily at 12 noon.     Marland Kitchen estradiol (DOTTI) 0.0375 MG/24HR Place 1 patch onto the skin 2 (two) times a week. 8 patch 2  . fluticasone (FLONASE) 50 MCG/ACT nasal spray INSTILL 2 SPRAYS INTO EACH NOSTRIL twice a day  0  . gabapentin (NEURONTIN) 100 MG capsule TAKE 2 CAPSULES(200 MG) BY MOUTH AT BEDTIME 180 capsule 1  . guaiFENesin (MUCINEX) 600 MG 12 hr tablet Take 1,200 mg by mouth 2 (two) times daily.    Marland Kitchen levocetirizine (XYZAL) 5 MG tablet TAKE 1 TABLET BY MOUTH EVERY EVENING 90 tablet 3  . montelukast (SINGULAIR) 10 MG tablet SMARTSIG:1 Tablet(s) By Mouth Every Evening    . olopatadine (PATANOL) 0.1 % ophthalmic solution INSTILL 1 DROP IN BOTH EYES TWICE DAILY 5 mL 5  . progesterone (PROMETRIUM) 100 MG capsule TAKE 1 CAPSULE(100 MG) BY MOUTH DAILY 90 capsule 2  . pseudoephedrine (SUDAFED) 30 MG tablet Take 30 mg by mouth every 4 (four) hours as needed for congestion.    Marland Kitchen zolpidem (AMBIEN) 10 MG tablet TAKE 1/2 TO 1 TABLET(5 TO 10 MG) BY MOUTH AT BEDTIME AS NEEDED FOR SLEEP 30 tablet 1   No current facility-administered medications for this visit.     ALLERGIES: Codeine and Pamelor [nortriptyline]  Family History  Problem Relation Age of Onset  . Hypertension Mother   . Diabetes Mother   . Heart disease Father   . Depression Sister   . Breast cancer  Cousin   . Colon cancer Maternal Grandmother         died at 61  . Diabetes Maternal Grandmother   . Alzheimer's disease Paternal Grandmother   . Colon polyps Maternal Uncle   . Migraines Neg Hx     Social History   Socioeconomic History  . Marital status: Soil scientist    Spouse name: Not on file  . Number of children: 0  . Years of education: 16  . Highest education level: Not on file  Occupational History  . Occupation: Naval architect: [employer]  Tobacco Use  . Smoking status: Never Smoker  .  Smokeless tobacco: Never Used  Vaping Use  . Vaping Use: Never used  Substance and Sexual Activity  . Alcohol use: No    Alcohol/week: 0.0 standard drinks  . Drug use: Never  . Sexual activity: Not Currently    Partners: Female    Birth control/protection: Abstinence, Post-menopausal  Other Topics Concern  . Not on file  Social History Narrative   Lives with female partner Juliann Pulse), 2 beagles   Social Determinants of Health   Financial Resource Strain:   . Difficulty of Paying Living Expenses: Not on file  Food Insecurity:   . Worried About Charity fundraiser in the Last Year: Not on file  . Ran Out of Food in the Last Year: Not on file  Transportation Needs:   . Lack of Transportation (Medical): Not on file  . Lack of Transportation (Non-Medical): Not on file  Physical Activity:   . Days of Exercise per Week: Not on file  . Minutes of Exercise per Session: Not on file  Stress:   . Feeling of Stress : Not on file  Social Connections:   . Frequency of Communication with Friends and Family: Not on file  . Frequency of Social Gatherings with Friends and Family: Not on file  . Attends Religious Services: Not on file  . Active Member of Clubs or Organizations: Not on file  . Attends Archivist Meetings: Not on file  . Marital Status: Not on file  Intimate Partner Violence:   . Fear of Current or Ex-Partner: Not on file  . Emotionally  Abused: Not on file  . Physically Abused: Not on file  . Sexually Abused: Not on file    Review of Systems  All other systems reviewed and are negative.   PHYSICAL EXAMINATION:    BP 122/68 (Cuff Size: Large)   Pulse 60   Ht 5\' 6"  (1.676 m)   Wt 181 lb (82.1 kg)   LMP 01/06/2016   BMI 29.21 kg/m     General appearance: alert, cooperative and appears stated age  ASSESSMENT  HRT.  Doing well.   PLAN  Continue Estradiol patch 0.0375 twice weekly and Prometrium 100 mg nightly. Discused WHI and use of HRT which can increase risk of PE, DVT, MI, stroke and breast cancer.  We discussed Gabapentin for treatment of night time hot flashes.  Mammogram due in March, 2022.  Annual exam due in May, 2022.

## 2020-06-19 ENCOUNTER — Ambulatory Visit: Payer: BC Managed Care – PPO | Admitting: Obstetrics and Gynecology

## 2020-06-19 ENCOUNTER — Other Ambulatory Visit: Payer: Self-pay

## 2020-06-19 ENCOUNTER — Encounter: Payer: Self-pay | Admitting: Obstetrics and Gynecology

## 2020-06-19 DIAGNOSIS — Z79890 Hormone replacement therapy (postmenopausal): Secondary | ICD-10-CM | POA: Diagnosis not present

## 2020-06-19 MED ORDER — ESTRADIOL 0.0375 MG/24HR TD PTTW: 1.0000 | MEDICATED_PATCH | TRANSDERMAL | 2 refills | Status: DC

## 2020-06-19 MED ORDER — PROGESTERONE MICRONIZED 100 MG PO CAPS: ORAL_CAPSULE | ORAL | 2 refills | Status: DC

## 2020-06-26 ENCOUNTER — Other Ambulatory Visit: Payer: Self-pay | Admitting: Obstetrics and Gynecology

## 2020-07-18 ENCOUNTER — Telehealth: Payer: Self-pay

## 2020-07-18 NOTE — Telephone Encounter (Signed)
I spoke with the pharmacists there and she said that she does still have a refill there but her ins. Will not allow it to be filled until 07/22/20.

## 2020-07-18 NOTE — Telephone Encounter (Signed)
Okay. I was sent it as a refill request, but clearly that isn't what is needed.  You'll need to advise the patient of what the pharmacist said, too soon to pick up per her insurance (unclear why, given that she filled it 2 months ago?), that she can pick up refill on 11/8.

## 2020-07-18 NOTE — Telephone Encounter (Signed)
Pt. Aware she said she was going to call the pharmacy to see why per the her ins. It could not be filled until 07/22/20 since she picked up the forst prescription 05/08/20.

## 2020-07-18 NOTE — Telephone Encounter (Signed)
PDMP reviewed--she last picked up refill on 05/08/20.  Looking at the rx sent that day, there should have been a refill on it that she hasn't picked up yet.  Please contact pharmacy to verify. If she just picked up the refill, then additional refills not appropriate--she uses prn and should let us know when additional refills needed. She has appt next month.

## 2020-07-18 NOTE — Telephone Encounter (Signed)
Received a fax from [facility] for a refill on the pts. Zolpidem pt. Last apt. 05/22/20 and next apt is 09/11/20.

## 2020-07-25 ENCOUNTER — Other Ambulatory Visit: Payer: Self-pay

## 2020-07-25 DIAGNOSIS — G4700 Insomnia, unspecified: Secondary | ICD-10-CM

## 2020-07-25 NOTE — Telephone Encounter (Signed)
Received fax from [facility] for a refill on the pts. Zolpidem pt. Last apt was 05/22/20 and next apt is 09/11/20.

## 2020-07-26 NOTE — Telephone Encounter (Signed)
The pharmacists said you are correct she still has a refill on that prescription and they will get it ready for her.

## 2020-07-26 NOTE — Telephone Encounter (Signed)
This was written in August for #30 with 1 refill.  Per PDMP review, she picked up the prescription in August, hasn't picked up the refill.  She should have refill on file. Please confirm with pharmacy (and let me know if/when she picked up the refill if for some reason the PDMP missed that?)

## 2020-08-24 ENCOUNTER — Other Ambulatory Visit: Payer: Self-pay | Admitting: Family Medicine

## 2020-08-24 DIAGNOSIS — G4700 Insomnia, unspecified: Secondary | ICD-10-CM

## 2020-08-26 NOTE — Telephone Encounter (Signed)
Pt has appt on 12/29

## 2020-08-26 NOTE — Telephone Encounter (Signed)
Pt will contact [facility] and see if they will fill the other #15

## 2020-08-26 NOTE — Telephone Encounter (Signed)
I think she may be having some issues with her pharmacy/insurance (see last phone message in chart from last month).  She was written #30 with 1 refill in August.  She picked up #30 in August, but only picked up #15 on 07/26/20 (only half of the refill).  She should still have another #15 available to her to pick up (unless there is an insurance issue of some sort).  Please have patient check with pharmacy (unless she knows what is going on with it, then let us know).

## 2020-08-27 ENCOUNTER — Other Ambulatory Visit: Payer: Self-pay | Admitting: Family Medicine

## 2020-08-27 DIAGNOSIS — G4700 Insomnia, unspecified: Secondary | ICD-10-CM

## 2020-08-27 NOTE — Telephone Encounter (Signed)
See prior messages regarding this refill request

## 2020-08-27 NOTE — Telephone Encounter (Signed)
[facility] is requesting to fill pt zolpidem. Please advise KH 

## 2020-09-08 NOTE — Patient Instructions (Addendum)
  HEALTH MAINTENANCE RECOMMENDATIONS:  It is recommended that you get at least 30 minutes of aerobic exercise at least 5 days/week (for weight loss, you may need as much as 60-90 minutes). This can be any activity that gets your heart rate up. This can be divided in 10-15 minute intervals if needed, but try and build up your endurance at least once a week.  Weight bearing exercise is also recommended twice weekly.  Eat a healthy diet with lots of vegetables, fruits and fiber.  "Colorful" foods have a lot of vitamins (ie green vegetables, tomatoes, red peppers, etc).  Limit sweet tea, regular sodas and alcoholic beverages, all of which has a lot of calories and sugar.  Up to 1 alcoholic drink daily may be beneficial for women (unless trying to lose weight, watch sugars).  Drink a lot of water.  Calcium recommendations are 1200-1500 mg daily (1500 mg for postmenopausal women or women without ovaries), and vitamin D 1000 IU daily.  This should be obtained from diet and/or supplements (vitamins), and calcium should not be taken all at once, but in divided doses.  Monthly self breast exams and yearly mammograms for women over the age of 28 is recommended.  Sunscreen of at least SPF 30 should be used on all sun-exposed parts of the skin when outside between the hours of 10 am and 4 pm (not just when at beach or pool, but even with exercise, golf, tennis, and yard work!)  Use a sunscreen that says "broad spectrum" so it covers both UVA and UVB rays, and make sure to reapply every 1-2 hours.  Remember to change the batteries in your smoke detectors when changing your clock times in the spring and fall. Carbon monoxide detectors are recommended for your home.  Use your seat belt every time you are in a car, and please drive safely and not be distracted with cell phones and texting while driving.  Consider taking a 2 week course of Nexium or Prilosec if the chest issues/heartburn aren't adequately controlled  with Pepcid alone.  Other option is increasing the pepcid to 40mg  dose.

## 2020-09-08 NOTE — Progress Notes (Signed)
Chief Complaint  Patient presents with  . Annual Exam    Fasting annual exam no pap. Much more frequent heartburn. Taking Pepcid daily, she has been hiccuping more often. Having chest pressure.     Brandi Carpenter is a 54 y.o. female who presents for a complete physical.   She finds herself needing to take a deep breath/sigh. Denies any associated palpitations or shortness of breath. Denies chest pressure or pain. She is also noting more frequent heartburn and hiccuping since September. She has been taking famotidine 20mg  BID, which resolves symptoms.  Recurrent symptoms if she misses a dose. Denies any changes in diet.  3 caffeinated beverages daily.  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  She is under the care of Dr. Quincy Simmonds, yearly exam due in May 2022.  She is on HRT (Estradiol patch 0.0375 mg twice weekly and Prometrium 100 mg nightly), and doing well.  She had sonohistogram and EMB in 01/2020 for PMB, showed benign endometrial polyp.  Hot flashes, memory concerns and insomnia improved since starting HRT.  No further PMB.    She still has insomnia, requiring the use of 1/2 tablet of ambien most nights (was too groggy in the mornings when tried [place] CR in the past). Last picked up #15 on 12/14, having some issues with her pharmacy/insurance. Might be eating too much sugar.  Limited caffeine (nothing after 2pm), no alcohol. Sometimes can't sleep even with the 1/2 ambien.  Denies anxiety.  She was last seen by me in September, with complaints of loose stools and abdominal cramping. There were no red flag symptoms  We discussed trial probiotics and lactose-free diet.  Discussed potential for gluten-free trial or low FODMAP if not better. Probiotic helped some, but then stools were loose again.  She had heartburn flare, starting in August/September (around the same time that allergies got worse and she went back on  immunotherapy) She reports she started taking pepcid, and bowels have improved. Stools are softer than normal, no longer having loose stools or cramping.  Restless legs--recurred after gabapentin stopped after her back surgery in 2020.  RLS improved since restarting gabapentin. This remains well controlled.  Chronic back pain, s/p microdisckectomy 10/2019 by Dr. Saintclair Halsted. She has been getting regular PT for her back.  Prolonged standing, and certain positions aggravate her back pain.  Not needing pain meds. MRI lumbar spine 03/2020: IMPRESSION: L2-3: Shallow right posterolateral disc protrusion with mild narrowing of the right lateral recess but no visible neural compression. L3-4: Small left extraforaminal protrusion without apparent change. L4-5: Disc bulge. Mild facet and ligamentous hypertrophy. Mild stenosis of both lateral recesses with some potential to affect the L5 nerves. This has worsened slightly since last year. L5-S1. Previous right partial hemilaminectomy and facetectomy. Discogenic endplate edema which could contribute to low back pain. Mild epidural fibrosis surrounding the right S1 nerve. No significant change since last year.  Depression/Anxiety: Moods are stable.Denies any side effectsfrom wellbutrin or citalopram.  Last refilled alprazolam #20 in 02/2020.  Uses it mainly for dentalwork. Took some alprazolam when having issues with getting her Lorrin Mais refilled  Allergies: Under the care of allergist.  She stopped her allergy shots last year, symptoms gradually recurred and she is now back on shots (since early Fall).  Allergies are improving. Has a tough time in the mornings with PND, takes a while to clear (clearing throat during visit).  H/o headaches and migraines--Resolved (  occasional sinus headache).  Improved after sinus and neck surgeries, and no longer has jaw clenching contributing to headaches. Doing well still, no headaches.   Immunization History   Administered Date(s) Administered  . Influenza Split 07/14/2014, 04/25/2016  . Influenza,inj,Quad PF,6+ Mos 06/28/2013, 08/01/2015, 06/16/2019, 05/22/2020  . Influenza-Unspecified 07/06/2017, 06/28/2018  . Janssen (J&J) SARS-COV-2 Vaccination 11/23/2019  . Moderna Sars-Covid-2 Vaccination 08/09/2020  . Tdap 05/11/2012  . Zoster 04/25/2016  . Zoster Recombinat (Shingrix) 08/25/2018, 10/26/2018   Last Pap smear:01/2020, normal per GYN Last mammogram:11/2019 Last colonoscopy:09/2015 with Dr. Ardis Hughs, normal. Last DEXA: never  Dentist: twice a year Ophtho:a few years ago, wears readers Exercise: PT for back, which includes elliptical 15 minutes 3x/week and weights.  Lipids: Lab Results  Component Value Date   CHOL 206 (H) 08/30/2019   HDL 64 08/30/2019   LDLCALC 122 (H) 08/30/2019   TRIG 111 08/30/2019   CHOLHDL 3.2 08/30/2019   Vitamin D-OH 67.5 in 04/2018   PMH, PSH, SH and FH reviewed and updated  Outpatient Encounter Medications as of 09/11/2020  Medication Sig  . ALPRAZolam (XANAX) 0.5 MG tablet TAKE 1/2 TO 1 TABLET BY MOUTH THREE TIMES DAILY AS NEEDED FOR ANXIETY  . azelastine (ASTELIN) 0.1 % nasal spray 2 (two) times daily.   Marland Kitchen buPROPion (WELLBUTRIN XL) 300 MG 24 hr tablet TAKE 1 TABLET BY MOUTH EVERY MORNING  . cetirizine (ZYRTEC) 10 MG tablet Take 10 mg by mouth daily.  . Cholecalciferol (VITAMIN D) 2000 UNITS tablet Take 2,000 Units by mouth daily.  . citalopram (CELEXA) 20 MG tablet TAKE 1 TABLET BY MOUTH ONCE DAILY  . estradiol (DOTTI) 0.0375 MG/24HR Place 1 patch onto the skin 2 (two) times a week.  . famotidine (PEPCID) 20 MG tablet Take 20 mg by mouth 2 (two) times daily.  . fluticasone (FLONASE) 50 MCG/ACT nasal spray INSTILL 2 SPRAYS INTO EACH NOSTRIL twice a day  . gabapentin (NEURONTIN) 100 MG capsule TAKE 2 CAPSULES(200 MG) BY MOUTH AT BEDTIME  . guaiFENesin (MUCINEX) 600 MG 12 hr tablet Take 1,200 mg by mouth 2 (two) times daily.  Marland Kitchen levocetirizine  (XYZAL) 5 MG tablet TAKE 1 TABLET BY MOUTH EVERY EVENING  . montelukast (SINGULAIR) 10 MG tablet SMARTSIG:1 Tablet(s) By Mouth Every Evening  . olopatadine (PATANOL) 0.1 % ophthalmic solution INSTILL 1 DROP IN BOTH EYES TWICE DAILY  . progesterone (PROMETRIUM) 100 MG capsule TAKE 1 CAPSULE(100 MG) BY MOUTH DAILY  . zolpidem (AMBIEN) 10 MG tablet TAKE 1/2 TO 1 TABLET(5 TO 10 MG) BY MOUTH AT BEDTIME AS NEEDED FOR SLEEP  . EPINEPHrine 0.3 mg/0.3 mL IJ SOAJ injection Inject into the muscle as directed.  (Patient not taking: Reported on 09/11/2020)  . pseudoephedrine (SUDAFED) 30 MG tablet Take 30 mg by mouth every 4 (four) hours as needed for congestion. (Patient not taking: Reported on 09/11/2020)  . [DISCONTINUED] esomeprazole (NEXIUM) 20 MG capsule Take 20 mg by mouth daily at 12 noon.  (Patient not taking: Reported on 09/11/2020)   No facility-administered encounter medications on file as of 09/11/2020.   Allergies  Allergen Reactions  . Codeine Itching and Nausea And Vomiting  . Pamelor [Nortriptyline] Tinitus   ROS: The patient denies anorexia, fever, vision changes, decreased hearing, ear pain, sore throat, breast concerns, chest pain, palpitations, dizziness, syncope, dyspnea on exertion, cough,swelling, nausea, vomiting, diarrhea, constipation, abdominal pain, melena, hematochezia, hematuria, incontinence, dysuria, vaginal bleeding, discharge, odor or itch, genital lesions, weakness, tremor, suspicious skin lesions, abnormal bleeding/bruising, or enlarged lymph nodes.  No further headaches Insomnia, per HPI, most days needing ambien, which is effective Depression/anxiety controlled Hot flashes resolved with HRT No further vaginal bleeding Back pain per HPI RLS resolved Weight gain since going back to the office (and stopped walking in the mornings) Heartburn per HPI   PHYSICAL EXAM:  BP 120/70   Pulse 68   Ht 5\' 4"  (1.626 m)   Wt 188 lb (85.3 kg)   LMP 01/06/2016   BMI 32.27  kg/m   Wt Readings from Last 3 Encounters:  09/11/20 188 lb (85.3 kg)  06/19/20 181 lb (82.1 kg)  05/22/20 179 lb 9.6 oz (81.5 kg)    General Appearance:  Alert, cooperative, no distress, appears stated age. She is clearing her throat frequently during visit. She did not change into gown for exam today  Head:  Normocephalic, without obvious abnormality, atraumatic   Eyes:  PERRL, conjunctiva/corneas clear, EOM's intact, fundi benign   Ears:  Normal TM's and external ear canals..  Nose:  Not examined, wearing mask due to COVID-19 pandemic   Throat:  Not examined, wearing mask due to COVID-19 pandemic  Neck:  Supple, no lymphadenopathy; thyroid: no enlargement/ tenderness/nodules; no carotid bruit or JVD   Back:  Spine nontender, no curvature, ROM normal, no CVA tenderness   Lungs:  Clear to auscultation bilaterally without wheezes, rales or ronchi; respirations unlabored   Chest Wall:  No tenderness or deformity   Heart:  Regular rate and rhythm, S1 and S2 normal, no murmur, rub or gallop   Breast Exam:  Deferred to GYN  Abdomen:  Soft, non-tender, nondistended, normoactive bowel sounds, no masses, no hepatosplenomegaly   Genitalia:  Deferred to GYN   Rectal:  Deferred to GYN  Extremities:  No clubbing, cyanosis or edema.  Pulses:  2+ and symmetric all extremities   Skin:  Skin color, texture, turgor normal, no rashes.(limited exam, not changed into gown)  Lymph nodes:  Cervical, supraclavicular, and axillary nodes normal   Neurologic:  Normal strength, sensation and gait; reflexes 2+ and symmetric throughout          Psych: Normal mood, affect, hygiene and grooming   ASSESSMENT/PLAN:  Annual physical exam - Plan: CBC with Differential/Platelet, Comprehensive metabolic panel, HIV Antibody (routine testing w rflx), Hepatitis C antibody  Restless leg syndrome - controlled with gabapentin  Depression,  major, in remission ([place]) - controlled with citalopram and wellbutrin  Insomnia, unspecified type - reviewed sleep hygiene measures. cont 1/2 ambien prn  Medication monitoring encounter - Plan: CBC with Differential/Platelet, Comprehensive metabolic panel  Need for hepatitis C screening test - Plan: Hepatitis C antibody  Gastroesophageal reflux disease without esophagitis - counseled in detail re: diet/behavioral modification. Can try 2wk course of PPI, then back to H2 blocker. Risks/SE reviewed  BMI 32.0-32.9,adult - diet/exercise/weight loss recommended/counseled   Discussed monthly self breast exams and yearly mammograms; at least 30 minutes of aerobic activity at least 5 days/week, weight-bearing exercise at least 2x/wk; proper sunscreen use reviewed; healthy diet, including goals of calcium and vitamin D intake and alcohol recommendations (less than or equal to 1 drink/day) reviewed; regular seatbelt use; changing batteries in smoke detectors. Immunization recommendations discussed--UTD, continue yearly flu shots.  Colonoscopy recommendations reviewed--UTD  F/u 1 year CPE  Refills not yet needed.  Will be due in January.  May be changing pharmacies.  She will let us know if changes, otherwise will request through pharmacy when needed

## 2020-09-11 ENCOUNTER — Other Ambulatory Visit: Payer: Self-pay

## 2020-09-11 ENCOUNTER — Encounter: Payer: Self-pay | Admitting: Family Medicine

## 2020-09-11 ENCOUNTER — Ambulatory Visit (INDEPENDENT_AMBULATORY_CARE_PROVIDER_SITE_OTHER): Payer: BC Managed Care – PPO | Admitting: Family Medicine

## 2020-09-11 VITALS — BP 120/70 | HR 68 | Ht 64.0 in | Wt 188.0 lb

## 2020-09-11 DIAGNOSIS — Z5181 Encounter for therapeutic drug level monitoring: Secondary | ICD-10-CM

## 2020-09-11 DIAGNOSIS — Z0000 Encounter for general adult medical examination without abnormal findings: Secondary | ICD-10-CM

## 2020-09-11 DIAGNOSIS — G4700 Insomnia, unspecified: Secondary | ICD-10-CM | POA: Diagnosis not present

## 2020-09-11 DIAGNOSIS — G2581 Restless legs syndrome: Secondary | ICD-10-CM

## 2020-09-11 DIAGNOSIS — Z6832 Body mass index (BMI) 32.0-32.9, adult: Secondary | ICD-10-CM

## 2020-09-11 DIAGNOSIS — K219 Gastro-esophageal reflux disease without esophagitis: Secondary | ICD-10-CM

## 2020-09-11 DIAGNOSIS — Z1159 Encounter for screening for other viral diseases: Secondary | ICD-10-CM

## 2020-09-11 DIAGNOSIS — F325 Major depressive disorder, single episode, in full remission: Secondary | ICD-10-CM | POA: Diagnosis not present

## 2020-09-12 LAB — COMPREHENSIVE METABOLIC PANEL
ALT: 19 IU/L (ref 0–32)
AST: 18 IU/L (ref 0–40)
Albumin/Globulin Ratio: 1.8 (ref 1.2–2.2)
Albumin: 4.4 g/dL (ref 3.8–4.9)
Alkaline Phosphatase: 72 IU/L (ref 44–121)
BUN/Creatinine Ratio: 12 (ref 9–23)
BUN: 14 mg/dL (ref 6–24)
Bilirubin Total: 0.5 mg/dL (ref 0.0–1.2)
CO2: 23 mmol/L (ref 20–29)
Calcium: 9.6 mg/dL (ref 8.7–10.2)
Chloride: 103 mmol/L (ref 96–106)
Creatinine, Ser: 1.2 mg/dL — ABNORMAL HIGH (ref 0.57–1.00)
GFR calc Af Amer: 59 mL/min/{1.73_m2} — ABNORMAL LOW (ref >59)
GFR calc non Af Amer: 51 mL/min/{1.73_m2} — ABNORMAL LOW (ref >59)
Globulin, Total: 2.4 g/dL (ref 1.5–4.5)
Glucose: 85 mg/dL (ref 65–99)
Potassium: 5.2 mmol/L (ref 3.5–5.2)
Sodium: 140 mmol/L (ref 134–144)
Total Protein: 6.8 g/dL (ref 6.0–8.5)

## 2020-09-12 LAB — CBC WITH DIFFERENTIAL/PLATELET
Basophils Absolute: 0 10*3/uL (ref 0.0–0.2)
Basos: 1 %
EOS (ABSOLUTE): 0.5 10*3/uL — ABNORMAL HIGH (ref 0.0–0.4)
Eos: 7 %
Hematocrit: 39 % (ref 34.0–46.6)
Hemoglobin: 13 g/dL (ref 11.1–15.9)
Immature Grans (Abs): 0 10*3/uL (ref 0.0–0.1)
Immature Granulocytes: 0 %
Lymphocytes Absolute: 1.8 10*3/uL (ref 0.7–3.1)
Lymphs: 26 %
MCH: 30.3 pg (ref 26.6–33.0)
MCHC: 33.3 g/dL (ref 31.5–35.7)
MCV: 91 fL (ref 79–97)
Monocytes Absolute: 0.7 10*3/uL (ref 0.1–0.9)
Monocytes: 10 %
Neutrophils Absolute: 4.1 10*3/uL (ref 1.4–7.0)
Neutrophils: 56 %
Platelets: 231 10*3/uL (ref 150–450)
RBC: 4.29 x10E6/uL (ref 3.77–5.28)
RDW: 12.4 % (ref 11.7–15.4)
WBC: 7.1 10*3/uL (ref 3.4–10.8)

## 2020-09-12 LAB — HIV ANTIBODY (ROUTINE TESTING W REFLEX): HIV Screen 4th Generation wRfx: NONREACTIVE

## 2020-09-12 LAB — HEPATITIS C ANTIBODY: Hep C Virus Ab: <0.1 s/co ratio (ref 0.0–0.9)

## 2020-09-17 ENCOUNTER — Other Ambulatory Visit: Payer: Self-pay | Admitting: Family Medicine

## 2020-09-17 DIAGNOSIS — G2581 Restless legs syndrome: Secondary | ICD-10-CM

## 2020-09-19 ENCOUNTER — Other Ambulatory Visit: Payer: Self-pay | Admitting: Family Medicine

## 2020-09-19 DIAGNOSIS — F325 Major depressive disorder, single episode, in full remission: Secondary | ICD-10-CM

## 2020-09-24 ENCOUNTER — Other Ambulatory Visit: Payer: Self-pay | Admitting: Family Medicine

## 2020-09-24 DIAGNOSIS — G4700 Insomnia, unspecified: Secondary | ICD-10-CM

## 2020-09-25 NOTE — Telephone Encounter (Signed)
Is this okay to refill? 

## 2020-09-26 ENCOUNTER — Encounter: Payer: Self-pay | Admitting: Family Medicine

## 2020-09-28 ENCOUNTER — Telehealth: Payer: Self-pay

## 2020-09-28 NOTE — Telephone Encounter (Signed)
P.A. ZOLPIDEM °

## 2020-09-29 NOTE — Telephone Encounter (Signed)
Additional questions submitted

## 2020-10-03 NOTE — Telephone Encounter (Signed)
P.A. approved til 10/01/23

## 2020-10-05 NOTE — Telephone Encounter (Signed)
Pt informed

## 2020-10-27 ENCOUNTER — Other Ambulatory Visit: Payer: Self-pay | Admitting: Family Medicine

## 2020-10-27 DIAGNOSIS — F411 Generalized anxiety disorder: Secondary | ICD-10-CM

## 2020-10-28 NOTE — Telephone Encounter (Signed)
PDMP reviewed, last filled 02/22/20.

## 2020-10-28 NOTE — Telephone Encounter (Signed)
Is this okay to refill? 

## 2020-12-16 ENCOUNTER — Other Ambulatory Visit: Payer: Self-pay | Admitting: Obstetrics and Gynecology

## 2020-12-16 DIAGNOSIS — Z0000 Encounter for general adult medical examination without abnormal findings: Secondary | ICD-10-CM

## 2021-01-29 NOTE — Progress Notes (Signed)
55 y.o. [place] Domestic Partner Caucasian female here for annual exam.    Patient has had occasional spotting in the last few months. Having a little cramping as well.  No patch adherence issues.  Taking Prometrium daily.  Not having hot flashes.   Wants to continue with HRT.  Hx postmenopausal bleeding and EMB showing endometrial polyp and SIS showing no endometrial mass about one year ago.   Also has history of hysteroscopy with Myosure resection of endocervical and endometrial polyp 01/23/19.   Had her Covid booster.   PCP: Rita Ohara, MD    Patient's last menstrual period was 01/06/2016.           Sexually active: No.  The current method of family planning is post menopausal status.    Exercising: No.  The patient does not participate in regular exercise at present. Smoker:  no  Health Maintenance: Pap: 01-23-20 Neg:Neg HR HPV, 12-26-18 AGUS:Neg HR HPV,  08/12/16 Neg:Neg HR HPV History of abnormal Pap:  Yes, 01-06-19 colpo biopsies were normal and 12-29-18 EMB was benign showing EMB polyp. 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 MMG:  11/14/19 BIRADS 1 negative/density c--APPT. 02/05/21 Colonoscopy: 09/27/15 Normal f/u 10 years BMD: n/a    Result  n/a TDaP:  05-11-12 Gardasil:   no HIV: never Hep C: never Screening Labs:  PCP   reports that she has never smoked. She has never used smokeless tobacco. She reports that she does not drink alcohol and does not use drugs.  Past Medical History:  Diagnosis Date  . Abnormal Pap smear    ASCUS 08/1999; CIN 11/1999, s/p conization, 12-26-2018 AGUS  . Allergic rhinitis, cause unspecified   . Anxiety   . Complication of anesthesia    "difficulty awakening" remote past  . DDD (degenerative disc disease), cervical 05/05/2018   Noted on imaging  . DDD (degenerative disc disease), lumbar 06/08/2018   Noted on imaging  . Depression   . Endometrial polyp   . Headache   . Insomnia   . PMB (postmenopausal bleeding)   . 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 Stenosis, cervical spine 05/05/2018   Noted on imaging  . Vitamin D deficiency     Past Surgical History:  Procedure Laterality Date  . ANKLE SURGERY  child   left for torn ligaments, left  . ANTERIOR CERVICAL DECOMP/DISCECTOMY FUSION N/A 01/27/2016   Procedure: C5-6, C6-7 Anterior Cervical Discectomy and Fusion, Allograft, Plate;  Surgeon: Marybelle Killings, MD;  Location: [facility];  Service: Orthopedics;  Laterality: N/A;  . CERVICAL CONIZATION W/BX  2001   Laser conization - Dr. Rhodia Albright - [facility] - Moderate to severe dysplasia, negative margins  . COLONOSCOPY  09/2015  . DILATATION & CURETTAGE/HYSTEROSCOPY WITH MYOSURE N/A 01/23/2019   Procedure: DILATATION & CURETTAGE/HYSTEROSCOPY WITH MYOSURE;  Surgeon: Nunzio Cobbs, MD;  Location: [facility];  Service: Gynecology;  Laterality: N/A;  . LUMBAR [place] SURGERY  10/2018  . LUMBAR MICRODISCECTOMY Right 11/09/2018   lumbar level:L5-S1 Utilizing microscope-Dr.Gary Cram  . NASAL SEPTOPLASTY W/ TURBINOPLASTY Bilateral 10/07/2015   Procedure: NASAL SEPTOPLASTY WITH BILATERAL TURBINATE REDUCTION;  Surgeon: Leta Baptist, MD;  Location: [facility];  Service: ENT;  Laterality: Bilateral;  . PILONIDAL CYST EXCISION    . WISDOM TOOTH EXTRACTION      Current Outpatient Medications  Medication Sig Dispense Refill  . albuterol (VENTOLIN HFA) 108 (90 Base) MCG/ACT inhaler 1 puff as needed    .  ALPRAZolam (XANAX) 0.5 MG tablet TAKE 1/2 TO 1 TABLET BY MOUTH THREE TIMES DAILY AS NEEDED FOR ANXIETY 20 tablet 0  . azelastine (ASTELIN) 0.1 % nasal spray 2 (two) times daily.     Marland Kitchen buPROPion (WELLBUTRIN XL) 300 MG 24 hr tablet TAKE 1 TABLET BY MOUTH EVERY MORNING 90 tablet 3  . cetirizine (ZYRTEC) 10 MG tablet Take 10 mg by mouth daily.    . Cholecalciferol (VITAMIN D) 2000 UNITS tablet Take 2,000 Units by mouth daily.    . citalopram (CELEXA) 20 MG tablet TAKE 1 TABLET BY MOUTH  ONCE DAILY 90 tablet 3  . EPINEPHrine 0.3 mg/0.3 mL IJ SOAJ injection Inject into the muscle as directed.    Marland Kitchen estradiol (DOTTI) 0.0375 MG/24HR Place 1 patch onto the skin 2 (two) times a week. 24 patch 2  . famotidine (PEPCID) 20 MG tablet Take 20 mg by mouth 2 (two) times daily.    . fluticasone (FLONASE) 50 MCG/ACT nasal spray INSTILL 2 SPRAYS INTO EACH NOSTRIL twice a day  0  . gabapentin (NEURONTIN) 100 MG capsule TAKE 2 CAPSULES(200 MG) BY MOUTH AT BEDTIME 180 capsule 1  . guaiFENesin (MUCINEX) 600 MG 12 hr tablet Take 1,200 mg by mouth 2 (two) times daily.    Marland Kitchen levocetirizine (XYZAL) 5 MG tablet TAKE 1 TABLET BY MOUTH EVERY EVENING 90 tablet 3  . montelukast (SINGULAIR) 10 MG tablet SMARTSIG:1 Tablet(s) By Mouth Every Evening    . Olopatadine HCl 0.2 % SOLN 1 drop into affected eye    . progesterone (PROMETRIUM) 100 MG capsule TAKE 1 CAPSULE(100 MG) BY MOUTH DAILY 90 capsule 2  . pseudoephedrine (SUDAFED) 30 MG tablet Take 30 mg by mouth every 4 (four) hours as needed for congestion.    Marland Kitchen zolpidem (AMBIEN) 10 MG tablet TAKE 1/2 TO 1 TABLET BY MOUTH EVERY DAY AT BEDTIME AS NEEDED FOR SLEEP 30 tablet 1   No current facility-administered medications for this visit.    Family History  Problem Relation Age of Onset  . Hypertension Mother   . Diabetes Mother   . GER disease Mother   . Heart disease Father   . Depression Sister   . Hypertension Sister   . Breast cancer Cousin   . Colon cancer Maternal Grandmother         died at 101  . Diabetes Maternal Grandmother   . Alzheimer's disease Paternal Grandmother   . Colon polyps Maternal Uncle   . GER disease Maternal Uncle   . Migraines Neg Hx     Review of Systems  All other systems reviewed and are negative.   Exam:   BP 118/66 (Cuff Size: Large)   Pulse 67   Ht 5\' 4"  (1.626 m)   Wt 188 lb (85.3 kg)   LMP 01/06/2016   SpO2 98%   BMI 32.27 kg/m     General appearance: alert, cooperative and appears stated age Head:  normocephalic, without obvious abnormality, atraumatic Neck: no adenopathy, supple, symmetrical, trachea midline and thyroid normal to inspection and palpation Lungs: clear to auscultation bilaterally Breasts: normal appearance, no masses or tenderness, No nipple retraction or dimpling, No nipple discharge or bleeding, No axillary adenopathy Heart: regular rate and rhythm Abdomen: soft, non-tender; no masses, no organomegaly Extremities: extremities normal, atraumatic, no cyanosis or edema Skin: skin color, texture, turgor normal. No rashes or lesions Lymph nodes: cervical, supraclavicular, and axillary nodes normal. Neurologic: grossly normal  Pelvic: External genitalia:  no lesions  No abnormal inguinal nodes palpated.              Urethra:  normal appearing urethra with no masses, tenderness or lesions              Bartholins and Skenes: normal                 Vagina: normal appearing vagina with normal color and discharge, no lesions              Cervix: no lesions              Pap taken: Yes.   Bimanual Exam:  Uterus:  normal size, contour, position, consistency, mobility, non-tender              Adnexa: no mass, fullness, tenderness              Rectal exam: Yes.  .  Confirms.              Anus:  normal sphincter tone, no lesions  Chaperone was present for exam.  Assessment:   Well woman visit with normal exam. Recurrent postmenopausal bleeding on HRT. Status post hysteroscopy with Myosure resection of benign polyp of cervix and endometrium. Hx AGUS pap and normal colpo.  Hx prior CIN 3.  Status post conization.   Plan: Mammogram screening discussed. Self breast awareness reviewed. Pap and HR HPV as above. Guidelines for Calcium, Vitamin D, regular exercise program including cardiovascular and weight bearing exercise. Return for sonohysterogram and possible endometrial biopsy.   Follow up annually and prn.

## 2021-02-05 ENCOUNTER — Ambulatory Visit
Admission: RE | Admit: 2021-02-05 | Discharge: 2021-02-05 | Disposition: A | Discharge status: Home / Self Care | Payer: BC Managed Care – PPO | Source: Ambulatory Visit | Attending: Obstetrics and Gynecology | Admitting: Obstetrics and Gynecology

## 2021-02-05 ENCOUNTER — Other Ambulatory Visit (HOSPITAL_COMMUNITY)
Admission: RE | Admit: 2021-02-05 | Discharge: 2021-02-05 | Disposition: A | Discharge status: Home / Self Care | Payer: BC Managed Care – PPO | Source: Ambulatory Visit | Attending: Obstetrics and Gynecology | Admitting: Obstetrics and Gynecology

## 2021-02-05 ENCOUNTER — Ambulatory Visit (INDEPENDENT_AMBULATORY_CARE_PROVIDER_SITE_OTHER): Payer: BC Managed Care – PPO | Admitting: Obstetrics and Gynecology

## 2021-02-05 ENCOUNTER — Other Ambulatory Visit: Payer: Self-pay

## 2021-02-05 ENCOUNTER — Encounter: Payer: Self-pay | Admitting: Obstetrics and Gynecology

## 2021-02-05 ENCOUNTER — Ambulatory Visit: Payer: BC Managed Care – PPO

## 2021-02-05 VITALS — BP 118/66 | HR 67 | Ht 64.0 in | Wt 188.0 lb

## 2021-02-05 DIAGNOSIS — Z79890 Hormone replacement therapy (postmenopausal): Secondary | ICD-10-CM

## 2021-02-05 DIAGNOSIS — Z124 Encounter for screening for malignant neoplasm of cervix: Secondary | ICD-10-CM | POA: Diagnosis not present

## 2021-02-05 DIAGNOSIS — Z01419 Encounter for gynecological examination (general) (routine) without abnormal findings: Secondary | ICD-10-CM | POA: Diagnosis not present

## 2021-02-05 DIAGNOSIS — N950 Postmenopausal bleeding: Secondary | ICD-10-CM | POA: Diagnosis not present

## 2021-02-05 DIAGNOSIS — Z0000 Encounter for general adult medical examination without abnormal findings: Secondary | ICD-10-CM

## 2021-02-05 MED ORDER — ESTRADIOL 0.0375 MG/24HR TD PTTW: 1.0000 | MEDICATED_PATCH | TRANSDERMAL | 3 refills | Status: DC

## 2021-02-05 MED ORDER — PROGESTERONE MICRONIZED 100 MG PO CAPS: ORAL_CAPSULE | ORAL | 3 refills | Status: DC

## 2021-02-05 NOTE — Patient Instructions (Signed)

## 2021-02-06 ENCOUNTER — Other Ambulatory Visit: Payer: Self-pay | Admitting: Family Medicine

## 2021-02-06 DIAGNOSIS — G4700 Insomnia, unspecified: Secondary | ICD-10-CM

## 2021-02-07 LAB — CYTOLOGY - PAP
Comment: NEGATIVE
High risk HPV: NEGATIVE

## 2021-02-07 NOTE — Telephone Encounter (Signed)
Ok to refill 

## 2021-02-11 NOTE — Progress Notes (Deleted)
GYNECOLOGY  VISIT   HPI: 55 y.o.   Domestic Partner Caucasian female   [place] with Patient's last menstrual period was 01/06/2016.   here for [facility] and endometrial biopsy.  GYNECOLOGIC HISTORY: Patient's last menstrual period was 01/06/2016. Contraception: post menopausal status. Menopausal hormone therapy: Estradiol 0.0375 mg Last mammogram:  11/14/19 BIRADS 1 negative/density c Last pap smear:   01-23-20 Neg:Neg HR HPV        OB History    Gravida  0   Para  0   Term  0   Preterm  0   AB  0   Living  0     SAB  0   IAB  0   Ectopic  0   Multiple  0   Live Births                 Patient Active Problem List   Diagnosis Date Noted  . Atypical glandular cells of undetermined significance (AGUS) on cervical Pap smear 01/08/2019  . Endometrial polyp 01/08/2019  . Postmenopausal bleeding 12/26/2018  . Lateral epicondylitis of right elbow 07/12/2018  . Lumbar radiculopathy, right 07/12/2018  . Piriformis syndrome of right side 06/08/2018  . Status post cervical spinal fusion 01/27/2016  . Headache 05/13/2015  . Blurry vision 05/13/2015  . Perceived hearing changes 05/13/2015  . Migraine headache 04/29/2015  . Raynaud phenomenon 08/28/2013  . Depression, major, in remission ([place]) 08/28/2013  . Allergic conjunctivitis 05/09/2013  . Anxiety 03/09/2012  . Insomnia 03/09/2012  . Allergic rhinitis 03/09/2012    Past Medical History:  Diagnosis Date  . Abnormal Pap smear    ASCUS 08/1999; CIN 11/1999, s/p conization, 12-26-2018 AGUS  . Allergic rhinitis, cause unspecified   . Anxiety   . Complication of anesthesia    "difficulty awakening" remote past  . DDD (degenerative disc disease), cervical 05/05/2018   Noted on imaging  . DDD (degenerative disc disease), lumbar 06/08/2018   Noted on imaging  . Depression   . Endometrial polyp   . Headache   . Insomnia   . PMB (postmenopausal bleeding)   . 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 Stenosis, cervical spine 05/05/2018   Noted on imaging  . Vitamin D deficiency     Past Surgical History:  Procedure Laterality Date  . ANKLE SURGERY  child   left for torn ligaments, left  . ANTERIOR CERVICAL DECOMP/DISCECTOMY FUSION N/A 01/27/2016   Procedure: C5-6, C6-7 Anterior Cervical Discectomy and Fusion, Allograft, Plate;  Surgeon: Marybelle Killings, MD;  Location: [place];  Service: Orthopedics;  Laterality: N/A;  . CERVICAL CONIZATION W/BX  2001   Laser conization - Dr. Rhodia Albright - [facility] - Moderate to severe dysplasia, negative margins  . COLONOSCOPY  09/2015  . DILATATION & CURETTAGE/HYSTEROSCOPY WITH MYOSURE N/A 01/23/2019   Procedure: DILATATION & CURETTAGE/HYSTEROSCOPY WITH MYOSURE;  Surgeon: Nunzio Cobbs, MD;  Location: [facility];  Service: Gynecology;  Laterality: N/A;  . LUMBAR [place] SURGERY  10/2018  . LUMBAR MICRODISCECTOMY Right 11/09/2018   lumbar level:L5-S1 Utilizing microscope-Dr.Gary Cram  . NASAL SEPTOPLASTY W/ TURBINOPLASTY Bilateral 10/07/2015   Procedure: NASAL SEPTOPLASTY WITH BILATERAL TURBINATE REDUCTION;  Surgeon: Leta Baptist, MD;  Location: [place];  Service: ENT;  Laterality: Bilateral;  . PILONIDAL CYST EXCISION    . WISDOM TOOTH EXTRACTION      Current Outpatient Medications  Medication Sig Dispense Refill  . zolpidem (AMBIEN) 10 MG  tablet TAKE 1/2 TO 1 TABLET BY MOUTH EVERY DAY AT BEDTIME AS NEEDED FOR SLEEP 30 tablet 2  . albuterol (VENTOLIN HFA) 108 (90 Base) MCG/ACT inhaler 1 puff as needed    . ALPRAZolam (XANAX) 0.5 MG tablet TAKE 1/2 TO 1 TABLET BY MOUTH THREE TIMES DAILY AS NEEDED FOR ANXIETY 20 tablet 0  . azelastine (ASTELIN) 0.1 % nasal spray 2 (two) times daily.     Marland Kitchen buPROPion (WELLBUTRIN XL) 300 MG 24 hr tablet TAKE 1 TABLET BY MOUTH EVERY MORNING 90 tablet 3  . cetirizine (ZYRTEC) 10 MG tablet Take 10 mg by mouth daily.    . Cholecalciferol (VITAMIN D) 2000 UNITS tablet Take 2,000 Units by  mouth daily.    . citalopram (CELEXA) 20 MG tablet TAKE 1 TABLET BY MOUTH ONCE DAILY 90 tablet 3  . EPINEPHrine 0.3 mg/0.3 mL IJ SOAJ injection Inject into the muscle as directed.    Marland Kitchen estradiol (DOTTI) 0.0375 MG/24HR Place 1 patch onto the skin 2 (two) times a week. 24 patch 3  . famotidine (PEPCID) 20 MG tablet Take 20 mg by mouth 2 (two) times daily.    . fluticasone (FLONASE) 50 MCG/ACT nasal spray INSTILL 2 SPRAYS INTO EACH NOSTRIL twice a day  0  . gabapentin (NEURONTIN) 100 MG capsule TAKE 2 CAPSULES(200 MG) BY MOUTH AT BEDTIME 180 capsule 1  . guaiFENesin (MUCINEX) 600 MG 12 hr tablet Take 1,200 mg by mouth 2 (two) times daily.    Marland Kitchen levocetirizine (XYZAL) 5 MG tablet TAKE 1 TABLET BY MOUTH EVERY EVENING 90 tablet 3  . montelukast (SINGULAIR) 10 MG tablet SMARTSIG:1 Tablet(s) By Mouth Every Evening    . Olopatadine HCl 0.2 % SOLN 1 drop into affected eye    . progesterone (PROMETRIUM) 100 MG capsule TAKE 1 CAPSULE(100 MG) BY MOUTH DAILY 90 capsule 3  . pseudoephedrine (SUDAFED) 30 MG tablet Take 30 mg by mouth every 4 (four) hours as needed for congestion.     No current facility-administered medications for this visit.     ALLERGIES: Codeine and Pamelor [nortriptyline]  Family History  Problem Relation Age of Onset  . Hypertension Mother   . Diabetes Mother   . GER disease Mother   . Heart disease Father   . Depression Sister   . Hypertension Sister   . Breast cancer Cousin   . Colon cancer Maternal Grandmother         died at 17  . Diabetes Maternal Grandmother   . Alzheimer's disease Paternal Grandmother   . Colon polyps Maternal Uncle   . GER disease Maternal Uncle   . Migraines Neg Hx     Social History   Socioeconomic History  . Marital status: Soil scientist    Spouse name: Not on file  . Number of children: 0  . Years of education: 24  . Highest education level: Not on file  Occupational History  . Occupation: Naval architect: [employer]   Tobacco Use  . Smoking status: Never Smoker  . Smokeless tobacco: Never Used  Vaping Use  . Vaping Use: Never used  Substance and Sexual Activity  . Alcohol use: No    Alcohol/week: 0.0 standard drinks  . Drug use: Never  . Sexual activity: Not Currently    Partners: Female    Birth control/protection: Abstinence, Post-menopausal  Other Topics Concern  . Not on file  Social History Narrative   Lives with female partner Juliann Pulse), 2 beagles  Social Determinants of Health   Financial Resource Strain: Not on file  Food Insecurity: Not on file  Transportation Needs: Not on file  Physical Activity: Not on file  Stress: Not on file  Social Connections: Not on file  Intimate Partner Violence: Not on file    Review of Systems  PHYSICAL EXAMINATION:    LMP 01/06/2016     General appearance: alert, cooperative and appears stated age Head: Normocephalic, without obvious abnormality, atraumatic Neck: no adenopathy, supple, symmetrical, trachea midline and thyroid normal to inspection and palpation Lungs: clear to auscultation bilaterally Breasts: normal appearance, no masses or tenderness, No nipple retraction or dimpling, No nipple discharge or bleeding, No axillary or supraclavicular adenopathy Heart: regular rate and rhythm Abdomen: soft, non-tender, no masses,  no organomegaly Extremities: extremities normal, atraumatic, no cyanosis or edema Skin: Skin color, texture, turgor normal. No rashes or lesions Lymph nodes: Cervical, supraclavicular, and axillary nodes normal. No abnormal inguinal nodes palpated Neurologic: Grossly normal  Pelvic: External genitalia:  no lesions              Urethra:  normal appearing urethra with no masses, tenderness or lesions              Bartholins and Skenes: normal                 Vagina: normal appearing vagina with normal color and discharge, no lesions              Cervix: no lesions                Bimanual Exam:  Uterus:  normal size,  contour, position, consistency, mobility, non-tender              Adnexa: no mass, fullness, tenderness              Rectal exam: {yes no:314532}.  Confirms.              Anus:  normal sphincter tone, no lesions  Chaperone was present for exam.  ASSESSMENT     PLAN     An After Visit Summary was printed and given to the patient.  ______ minutes face to face time of which over 50% was spent in counseling.

## 2021-02-25 ENCOUNTER — Other Ambulatory Visit: Payer: BC Managed Care – PPO | Admitting: Obstetrics and Gynecology

## 2021-02-25 ENCOUNTER — Other Ambulatory Visit: Payer: BC Managed Care – PPO

## 2021-03-13 ENCOUNTER — Other Ambulatory Visit: Payer: Self-pay | Admitting: Family Medicine

## 2021-03-13 DIAGNOSIS — G2581 Restless legs syndrome: Secondary | ICD-10-CM

## 2021-04-03 ENCOUNTER — Ambulatory Visit (INDEPENDENT_AMBULATORY_CARE_PROVIDER_SITE_OTHER): Payer: BC Managed Care – PPO

## 2021-04-03 ENCOUNTER — Encounter: Payer: Self-pay | Admitting: Obstetrics and Gynecology

## 2021-04-03 ENCOUNTER — Other Ambulatory Visit: Payer: Self-pay | Admitting: Obstetrics and Gynecology

## 2021-04-03 ENCOUNTER — Other Ambulatory Visit (HOSPITAL_COMMUNITY)
Admission: RE | Admit: 2021-04-03 | Discharge: 2021-04-03 | Disposition: A | Discharge status: Home / Self Care | Payer: BC Managed Care – PPO | Source: Ambulatory Visit | Attending: Obstetrics and Gynecology | Admitting: Obstetrics and Gynecology

## 2021-04-03 ENCOUNTER — Ambulatory Visit: Payer: BC Managed Care – PPO | Admitting: Obstetrics and Gynecology

## 2021-04-03 ENCOUNTER — Other Ambulatory Visit: Payer: Self-pay

## 2021-04-03 VITALS — BP 122/74 | HR 66 | Ht 64.0 in | Wt 185.4 lb

## 2021-04-03 DIAGNOSIS — N9489 Other specified conditions associated with female genital organs and menstrual cycle: Secondary | ICD-10-CM | POA: Diagnosis present

## 2021-04-03 DIAGNOSIS — R87619 Unspecified abnormal cytological findings in specimens from cervix uteri: Secondary | ICD-10-CM | POA: Insufficient documentation

## 2021-04-03 DIAGNOSIS — N950 Postmenopausal bleeding: Secondary | ICD-10-CM

## 2021-04-03 NOTE — Progress Notes (Signed)
GYNECOLOGY  VISIT   HPI: 55 y.o.   Domestic Partner  Caucasian  female   [place] with Patient's last menstrual period was 01/06/2016.   here for sonohysterogram and EMB.    She has recurrent postmenopausal bleeding on HRT.  AGUS pap.  Colpo scheduled for 04/17/21.   Hx CIN III.   Status post conization.   Hx benign endometrial polyp, 01/2019.  GYNECOLOGIC HISTORY: Patient's last menstrual period was 01/06/2016. Contraception:  PMP Menopausal hormone therapy: Dotti patch and Progesterone Last mammogram: 02-05-21 3D/Neg/Birads1 Last pap smear:   02/05/21 - AGUS, neg HR HPV, 01-23-20 Neg:Neg HR HPV, 12-26-18 AGUS:Neg HR HPV,  08/12/16 Neg:Neg HR HPV        OB History     Gravida  0   Para  0   Term  0   Preterm  0   AB  0   Living  0      SAB  0   IAB  0   Ectopic  0   Multiple  0   Live Births                 Patient Active Problem List   Diagnosis Date Noted   Atypical glandular cells of undetermined significance (AGUS) on cervical Pap smear 01/08/2019   Endometrial polyp 01/08/2019   Postmenopausal bleeding 12/26/2018   Lateral epicondylitis of right elbow 07/12/2018   Lumbar radiculopathy, right 07/12/2018   Piriformis syndrome of right side 06/08/2018   Status post cervical spinal fusion 01/27/2016   Headache 05/13/2015   Blurry vision 05/13/2015   Perceived hearing changes 05/13/2015   Migraine headache 04/29/2015   Raynaud phenomenon 08/28/2013   Depression, major, in remission ([place]) 08/28/2013   Allergic conjunctivitis 05/09/2013   Anxiety 03/09/2012   Insomnia 03/09/2012   Allergic rhinitis 03/09/2012    Past Medical History:  Diagnosis Date   Abnormal Pap smear    ASCUS 08/1999; CIN 11/1999, s/p conization, 12-26-2018 AGUS   Allergic rhinitis, cause unspecified    Anxiety    Complication of anesthesia    "difficulty awakening" remote past   DDD (degenerative disc disease), cervical 05/05/2018   Noted on imaging   DDD (degenerative  disc disease), lumbar 06/08/2018   Noted on imaging   Depression    Endometrial polyp    Headache    Insomnia    PMB (postmenopausal bleeding)    Spondylolysis, lumbar region 07/13/2003   Noted on imaging   Stenosis, cervical spine 05/05/2018   Noted on imaging   Vitamin D deficiency     Past Surgical History:  Procedure Laterality Date   ANKLE SURGERY  child   left for torn ligaments, left   ANTERIOR CERVICAL DECOMP/DISCECTOMY FUSION N/A 01/27/2016   Procedure: C5-6, C6-7 Anterior Cervical Discectomy and Fusion, Allograft, Plate;  Surgeon: Marybelle Killings, MD;  Location: [facility];  Service: Orthopedics;  Laterality: N/A;   CERVICAL CONIZATION W/BX  2001   Laser conization - Dr. Rhodia Albright - [facility] - Moderate to severe dysplasia, negative margins   COLONOSCOPY  09/2015   DILATATION & CURETTAGE/HYSTEROSCOPY WITH MYOSURE N/A 01/23/2019   Procedure: [place];  Surgeon: Nunzio Cobbs, MD;  Location: [facility];  Service: Gynecology;  Laterality: N/A;   LUMBAR [place]C SURGERY  10/2018   LUMBAR MICRODISCECTOMY Right 11/09/2018   lumbar level:L5-S1 Utilizing microscope-Dr.Gary Cram   NASAL SEPTOPLASTY W/ TURBINOPLASTY Bilateral 10/07/2015   Procedure: NASAL SEPTOPLASTY WITH BILATERAL TURBINATE  REDUCTION;  Surgeon: Leta Baptist, MD;  Location: [place];  Service: ENT;  Laterality: Bilateral;   PILONIDAL CYST EXCISION     WISDOM TOOTH EXTRACTION      Current Outpatient Medications  Medication Sig Dispense Refill   albuterol (VENTOLIN HFA) 108 (90 Base) MCG/ACT inhaler 1 puff as needed     ALPRAZolam (XANAX) 0.5 MG tablet TAKE 1/2 TO 1 TABLET BY MOUTH THREE TIMES DAILY AS NEEDED FOR ANXIETY 20 tablet 0   azelastine (ASTELIN) 0.1 % nasal spray 2 (two) times daily.      buPROPion (WELLBUTRIN XL) 300 MG 24 hr tablet TAKE 1 TABLET BY MOUTH EVERY MORNING 90 tablet 3   cetirizine (ZYRTEC) 10 MG tablet Take 10 mg by mouth  daily.     Cholecalciferol (VITAMIN D) 2000 UNITS tablet Take 2,000 Units by mouth daily.     citalopram (CELEXA) 20 MG tablet TAKE 1 TABLET BY MOUTH ONCE DAILY 90 tablet 3   EPINEPHrine 0.3 mg/0.3 mL IJ SOAJ injection Inject into the muscle as directed.     estradiol (DOTTI) 0.0375 MG/24HR Place 1 patch onto the skin 2 (two) times a week. 24 patch 3   famotidine (PEPCID) 20 MG tablet Take 20 mg by mouth 2 (two) times daily.     fluticasone (FLONASE) 50 MCG/ACT nasal spray INSTILL 2 SPRAYS INTO EACH NOSTRIL twice a day  0   gabapentin (NEURONTIN) 100 MG capsule TAKE 2 CAPSULES(200 MG) BY MOUTH AT BEDTIME 180 capsule 1   guaiFENesin (MUCINEX) 600 MG 12 hr tablet Take 1,200 mg by mouth 2 (two) times daily.     levocetirizine (XYZAL) 5 MG tablet TAKE 1 TABLET BY MOUTH EVERY EVENING 90 tablet 3   montelukast (SINGULAIR) 10 MG tablet SMARTSIG:1 Tablet(s) By Mouth Every Evening     Olopatadine HCl 0.2 % SOLN 1 drop into affected eye     progesterone (PROMETRIUM) 100 MG capsule TAKE 1 CAPSULE(100 MG) BY MOUTH DAILY 90 capsule 3   pseudoephedrine (SUDAFED) 30 MG tablet Take 30 mg by mouth every 4 (four) hours as needed for congestion.     zolpidem (AMBIEN) 10 MG tablet TAKE 1/2 TO 1 TABLET BY MOUTH EVERY DAY AT BEDTIME AS NEEDED FOR SLEEP 30 tablet 2   No current facility-administered medications for this visit.     ALLERGIES: Codeine and Pamelor [nortriptyline]  Family History  Problem Relation Age of Onset   Hypertension Mother    Diabetes Mother    GER disease Mother    Heart disease Father    Depression Sister    Hypertension Sister    Breast cancer Cousin    Colon cancer Maternal Grandmother         died at 70   Diabetes Maternal Grandmother    Alzheimer's disease Paternal Grandmother    Colon polyps Maternal Uncle    GER disease Maternal Uncle    Migraines Neg Hx     Social History   Socioeconomic History   Marital status: Soil scientist    Spouse name: Not on file    Number of children: 0   Years of education: 16   Highest education level: Not on file  Occupational History   Occupation: Naval architect: [employer]  Tobacco Use   Smoking status: Never   Smokeless tobacco: Never  Vaping Use   Vaping Use: Never used  Substance and Sexual Activity   Alcohol use: No    Alcohol/week: 0.0 standard  drinks   Drug use: Never   Sexual activity: Not Currently    Partners: Female    Birth control/protection: Abstinence, Post-menopausal  Other Topics Concern   Not on file  Social History Narrative   Lives with female partner Juliann Pulse), 2 beagles   Social Determinants of Health   Financial Resource Strain: Not on file  Food Insecurity: Not on file  Transportation Needs: Not on file  Physical Activity: Not on file  Stress: Not on file  Social Connections: Not on file  Intimate Partner Violence: Not on file    Review of Systems  See HPI.   PHYSICAL EXAMINATION:    BP 122/74   Pulse 66   Ht 5\' 4"  (1.626 m)   Wt 185 lb 6.4 oz (84.1 kg)   LMP 01/06/2016   SpO2 99%   BMI 31.82 kg/m     General appearance: alert, cooperative and appears stated age   Pelvic US Uterus 7.19 x 4.15 x 4.83 cm.  EMS 6.9 mm.  Left ovary normal.  Right ovary follicle 6 mm.  No adnexal masses.  No free fluid.  Sonohysterogram Consent done.  Blood noted from os. Hibiclens prep. Tenaculum to anterior cervical lip.  Cannula placed.   Sterile NS injected and filling defect 19 x 9 mm noted.   Endometrial biopsy Consent done.  Hibiclens prep.  Paracervical block with 10 cc 1% lidocaine, lot [id number], exp Jan 2024.  Pipelle to 7 cm x 2.  Tissue to pathology.  No complications.  Minimal EBL.  Chaperone was present for exam:  Ronald Pippins, [place] technician.   ASSESSMENT   Postmenopausal bleeding. Endometrial mass.  AGUS pap. Hx cervical dysplasia.  Status post conization. Status post hysteroscopic polypectomy, dilation and curettage. On  HRT.  PLAN  Pelvic US and sonohysterogram report and images reviewed.  Fu endometrial biopsy.  Return for colposcopy to complete evaluation.  Final plan to follow after evaluation is complete.   An After Visit Summary was printed and given to the patient.

## 2021-04-03 NOTE — Patient Instructions (Signed)

## 2021-04-09 LAB — SURGICAL PATHOLOGY

## 2021-04-09 NOTE — Progress Notes (Signed)
  Subjective:     Patient ID: Brandi Carpenter, female   DOB: 18-Jul-1966, 55 y.o.   MRN: [id number]  HPI Patient here today for colposcopy with pap smear  02-05-21 AGUS:Neg HR HPV.  PAP HISTORY; 02-05-21 AGUS:Neg HR HPV 01-23-20 Neg:Neg HR HPV, 12-26-18 AGUS:Neg HR HPV,  08/12/16 Neg:Neg HR HPV  Recent dx of benign endometrial polyp on endometrial biopsy done for postmenopausal bleeding on HRT.  Review of Systems  All other systems reviewed and are negative. LMP: PMP Contraception: PMP      Objective:   Physical Exam  Colposcopy of the cervix, vagina, and vulva.  Consent for procedure.  Acetic acid placed.  Colposcopy satisfactory.  No lesions of the cervix, vagina, or vulva.  ECC collected and sent to pathology.  Minimal EBL.  No complications.     Assessment:     AGUS pap, negative HR HPV.  Hx conization of cervix, CIN III. Postmenopausal bleeding on HRT.  Benign endometrial polyp. Hx prior hysteroscopy with polypectomy.    Plan:     Follow up ECC.  Final plan to follow.

## 2021-04-17 ENCOUNTER — Other Ambulatory Visit (HOSPITAL_COMMUNITY)
Admission: RE | Admit: 2021-04-17 | Discharge: 2021-04-17 | Disposition: A | Discharge status: Home / Self Care | Payer: BC Managed Care – PPO | Source: Ambulatory Visit | Attending: Obstetrics and Gynecology | Admitting: Obstetrics and Gynecology

## 2021-04-17 ENCOUNTER — Other Ambulatory Visit: Payer: Self-pay

## 2021-04-17 ENCOUNTER — Encounter: Payer: Self-pay | Admitting: Obstetrics and Gynecology

## 2021-04-17 ENCOUNTER — Ambulatory Visit: Payer: BC Managed Care – PPO | Admitting: Obstetrics and Gynecology

## 2021-04-17 DIAGNOSIS — R87619 Unspecified abnormal cytological findings in specimens from cervix uteri: Secondary | ICD-10-CM

## 2021-04-18 NOTE — Patient Instructions (Signed)
Colposcopy, Care After This sheet gives you information about how to care for yourself after your procedure. Your doctor may also give you more specific instructions. If youhave problems or questions, contact your doctor. What can I expect after the procedure? If you did not have a sample of your tissue taken out (did not have a biopsy), you may only have some spotting of blood for a few days. You can go back toyour normal activities. If you had a sample of your tissue taken out, it is common to have: Soreness and mild pain. These may last for a few days. A light-headed feeling. Mild bleeding or fluid (discharge) coming from your vagina. The fluid will look dark and grainy. You may have this for a few days. The fluid may be caused by a liquid that was used during your procedure. You may need to wear a sanitary pad. Spotting of blood for at least 48 hours after the procedure. Follow these instructions at home: Medicines Take over-the-counter and prescription medicines only as told by your doctor. Ask your doctor what medicines you can start taking again. This is very important if you take blood thinners. Activity Limit your activity for the first day after your procedure as told by your doctor. For at least 3 days, or for as long as told by your doctor, avoid: Douching. Using tampons. Having sex. Return to your normal activities as told by your doctor. Ask your doctor what activities are safe for you. General instructions  Drink enough fluid to keep your pee (urine) pale yellow. Ask your doctor if you may take baths, swim, or use a hot tub. You may take showers. If you use birth control (contraception), keep using it. Keep all follow-up visits as told by your doctor. This is important.  Contact a doctor if: You get a skin rash. Get help right away if: You bleed a lot from your vagina. A lot of bleeding means you use more than one pad an hour for 2 hours in a row. You have clumps of  blood (blood clots) coming from your vagina. You have a fever or chills. You have signs of infection. This may be fluid coming from your vagina that is: Different than normal. Yellow. Bad-smelling. You have very bad pain or cramps in your lower belly that do not get better with medicine. You faint. Summary If you did not have a sample of your tissue taken out, you may only have some spotting of blood for a few days. You can go back to your normal activities. If you had a sample of your tissue taken out, it is common to have mild pain for a few days and spotting for 48 hours. Avoid douching, using tampons, and having sex for at least 3 days after the procedure or for as long as told. Get help right away if you have a lot of bleeding, very bad pain, or signs of infection. This information is not intended to replace advice given to you by your health care provider. Make sure you discuss any questions you have with your healthcare provider. Document Revised: 07/02/2020 Document Reviewed: 08/30/2019 Elsevier Patient Education  2022 Elsevier Inc.  

## 2021-04-22 LAB — SURGICAL PATHOLOGY

## 2021-04-24 ENCOUNTER — Telehealth: Payer: Self-pay | Admitting: Obstetrics and Gynecology

## 2021-04-24 NOTE — Telephone Encounter (Signed)
Please precert and schedule surgery.   Patient has postmenopausal bleeding and an endometrial polyp.  She needs a hysteroscopy with Myosure resection of endometrial polyp, dilation and curettage at [facility].   Time needed is 1 hour.   She needs a preop with me.

## 2021-04-30 NOTE — Telephone Encounter (Signed)
Spoke with patient regarding surgery benefits. Patient acknowledges understanding of information presented. Patient is aware that benefits presented are professional benefits only. Patient is aware that once surgery is scheduled, the hospital will call with separate benefits. See account note.

## 2021-04-30 NOTE — Telephone Encounter (Signed)
Spoke with patient. Reviewed surgery dates. Patient request to proceed with surgery on 05/13/21.  Advised patient I will forward to business office for return call. I will return call once surgery date and time confirmed. Patient verbalizes understanding and is agreeable.   Surgery request sent.   Routing to [facility]

## 2021-05-02 NOTE — Telephone Encounter (Signed)
Spoke with patient. Surgery date request confirmed.  Advised surgery is scheduled for 05/13/21 at [place], [facility]. Surgery instruction sheet and hospital brochure reviewed, printed copy will be mailed.Patient advised if Covid screening and quarantine requirements and agreeable.   Routing to provider. Encounter closed.  Cc: Hayley Carder

## 2021-05-05 ENCOUNTER — Encounter: Payer: Self-pay | Admitting: Obstetrics and Gynecology

## 2021-05-05 ENCOUNTER — Other Ambulatory Visit: Payer: Self-pay

## 2021-05-05 ENCOUNTER — Ambulatory Visit (INDEPENDENT_AMBULATORY_CARE_PROVIDER_SITE_OTHER): Payer: BC Managed Care – PPO | Admitting: Obstetrics and Gynecology

## 2021-05-05 VITALS — BP 128/70 | HR 70 | Ht 64.0 in

## 2021-05-05 DIAGNOSIS — N950 Postmenopausal bleeding: Secondary | ICD-10-CM

## 2021-05-05 DIAGNOSIS — R87619 Unspecified abnormal cytological findings in specimens from cervix uteri: Secondary | ICD-10-CM | POA: Diagnosis not present

## 2021-05-05 DIAGNOSIS — N840 Polyp of corpus uteri: Secondary | ICD-10-CM

## 2021-05-05 NOTE — Progress Notes (Signed)
GYNECOLOGY  VISIT   HPI: 55 y.o.   Domestic Partner  Caucasian  female   [place] with Patient's last menstrual period was 01/06/2016.   here for Pre-op exam.  Patient had postmenopausal bleeding on HRT and an AGUS pap. Her endometrial biopsy confirmed a benign polyp and sonohysterogram confirmed a filling defect 19 x 9 mm in size.  Her colposcopy with ECC showed benign endocervical cells.   No further bleeding.   She has a history of CIN III in 2000 and a prior benign endometrial polyp in 2020.  GYNECOLOGIC HISTORY: Patient's last menstrual period was 01/06/2016. Contraception:  PMP Menopausal hormone therapy:  Dotti patch and Progesterone Last mammogram:  02-05-21 3D/Neg/Birads1 Last pap smear: 02-05-21 AGUS:Neg HR HPV, 01-23-20 Neg:Neg HR HPV, 12-26-18 AGUS:Neg HR HPV        OB History     Gravida  0   Para  0   Term  0   Preterm  0   AB  0   Living  0      SAB  0   IAB  0   Ectopic  0   Multiple  0   Live Births                 Patient Active Problem List   Diagnosis Date Noted   Atypical glandular cells of undetermined significance (AGUS) on cervical Pap smear 01/08/2019   Endometrial polyp 01/08/2019   Postmenopausal bleeding 12/26/2018   Lateral epicondylitis of right elbow 07/12/2018   Lumbar radiculopathy, right 07/12/2018   Piriformis syndrome of right side 06/08/2018   Status post cervical spinal fusion 01/27/2016   Headache 05/13/2015   Blurry vision 05/13/2015   Perceived hearing changes 05/13/2015   Migraine headache 04/29/2015   Raynaud phenomenon 08/28/2013   Depression, major, 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   Allergic conjunctivitis 05/09/2013   Anxiety 03/09/2012   Insomnia 03/09/2012   Allergic rhinitis 03/09/2012    Past Medical History:  Diagnosis Date   Abnormal Pap smear    ASCUS 08/1999; CIN 11/1999, s/p conization, 12-26-2018 AGUS   Allergic rhinitis, cause unspecified    Anxiety    Complication of anesthesia     "difficulty awakening" remote past   DDD (degenerative disc disease), cervical 05/05/2018   Noted on imaging   DDD (degenerative disc disease), lumbar 06/08/2018   Noted on imaging   Depression    Endometrial polyp    Headache    Insomnia    PMB (postmenopausal bleeding)    Spondylolysis, lumbar region 07/13/2003   Noted on imaging   Stenosis, cervical spine 05/05/2018   Noted on imaging   Vitamin D deficiency     Past Surgical History:  Procedure Laterality Date   ANKLE SURGERY  child   left for torn ligaments, left   ANTERIOR CERVICAL DECOMP/DISCECTOMY FUSION N/A 01/27/2016   Procedure: C5-6, C6-7 Anterior Cervical Discectomy and Fusion, Allograft, Plate;  Surgeon: Marybelle Killings, MD;  Location: [place];  Service: Orthopedics;  Laterality: N/A;   CERVICAL CONIZATION W/BX  2001   Laser conization - Dr. Rhodia Albright - [facility] - Moderate to severe dysplasia, negative margins   COLONOSCOPY  09/2015   DILATATION & CURETTAGE/HYSTEROSCOPY WITH MYOSURE N/A 01/23/2019   Procedure: [place];  Surgeon: Nunzio Cobbs, MD;  Location: [facility];  Service: Gynecology;  Laterality: N/A;   LUMBAR DISC SURGERY  10/2018   LUMBAR MICRODISCECTOMY Right 11/09/2018   lumbar  level:L5-S1 Utilizing microscope-Dr.Gary Cram   NASAL SEPTOPLASTY W/ TURBINOPLASTY Bilateral 10/07/2015   Procedure: NASAL SEPTOPLASTY WITH BILATERAL TURBINATE REDUCTION;  Surgeon: Leta Baptist, MD;  Location: [place];  Service: ENT;  Laterality: Bilateral;   PILONIDAL CYST EXCISION     WISDOM TOOTH EXTRACTION      Current Outpatient Medications  Medication Sig Dispense Refill   albuterol (VENTOLIN HFA) 108 (90 Base) MCG/ACT inhaler 1 puff as needed     ALPRAZolam (XANAX) 0.5 MG tablet TAKE 1/2 TO 1 TABLET BY MOUTH THREE TIMES DAILY AS NEEDED FOR ANXIETY 20 tablet 0   azelastine (ASTELIN) 0.1 % nasal spray 2 (two) times daily.      buPROPion (WELLBUTRIN  XL) 300 MG 24 hr tablet TAKE 1 TABLET BY MOUTH EVERY MORNING 90 tablet 3   cetirizine (ZYRTEC) 10 MG tablet Take 10 mg by mouth daily.     Cholecalciferol (VITAMIN D) 2000 UNITS tablet Take 2,000 Units by mouth daily.     citalopram (CELEXA) 20 MG tablet TAKE 1 TABLET BY MOUTH ONCE DAILY 90 tablet 3   EPINEPHrine 0.3 mg/0.3 mL IJ SOAJ injection Inject into the muscle as directed.     estradiol (DOTTI) 0.0375 MG/24HR Place 1 patch onto the skin 2 (two) times a week. 24 patch 3   famotidine (PEPCID) 20 MG tablet Take 20 mg by mouth 2 (two) times daily.     fluticasone (FLONASE) 50 MCG/ACT nasal spray INSTILL 2 SPRAYS INTO EACH NOSTRIL twice a day  0   gabapentin (NEURONTIN) 100 MG capsule TAKE 2 CAPSULES(200 MG) BY MOUTH AT BEDTIME 180 capsule 1   guaiFENesin (MUCINEX) 600 MG 12 hr tablet Take 1,200 mg by mouth 2 (two) times daily.     levocetirizine (XYZAL) 5 MG tablet TAKE 1 TABLET BY MOUTH EVERY EVENING 90 tablet 3   montelukast (SINGULAIR) 10 MG tablet SMARTSIG:1 Tablet(s) By Mouth Every Evening     Olopatadine HCl 0.2 % SOLN 1 drop into affected eye     progesterone (PROMETRIUM) 100 MG capsule TAKE 1 CAPSULE(100 MG) BY MOUTH DAILY 90 capsule 3   pseudoephedrine (SUDAFED) 30 MG tablet Take 30 mg by mouth every 4 (four) hours as needed for congestion.     zolpidem (AMBIEN) 10 MG tablet TAKE 1/2 TO 1 TABLET BY MOUTH EVERY DAY AT BEDTIME AS NEEDED FOR SLEEP 30 tablet 2   No current facility-administered medications for this visit.     ALLERGIES: Codeine and Pamelor [nortriptyline]  Family History  Problem Relation Age of Onset   Hypertension Mother    Diabetes Mother    GER disease Mother    Heart disease Father    Depression Sister    Hypertension Sister    Breast cancer Cousin    Colon cancer Maternal Grandmother         died at 71   Diabetes Maternal Grandmother    Alzheimer's disease Paternal Grandmother    Colon polyps Maternal Uncle    GER disease Maternal Uncle    Migraines  Neg Hx     Social History   Socioeconomic History   Marital status: Soil scientist    Spouse name: Not on file   Number of children: 0   Years of education: 16   Highest education level: Not on file  Occupational History   Occupation: Naval architect: [employer]  Tobacco Use   Smoking status: Never   Smokeless tobacco: Never  Vaping Use  Vaping Use: Never used  Substance and Sexual Activity   Alcohol use: No    Alcohol/week: 0.0 standard drinks   Drug use: Never   Sexual activity: Not Currently    Partners: Female    Birth control/protection: Abstinence, Post-menopausal  Other Topics Concern   Not on file  Social History Narrative   Lives with female partner Juliann Pulse), 2 beagles   Social Determinants of Health   Financial Resource Strain: Not on file  Food Insecurity: Not on file  Transportation Needs: Not on file  Physical Activity: Not on file  Stress: Not on file  Social Connections: Not on file  Intimate Partner Violence: Not on file    Review of Systems  All other systems reviewed and are negative.  PHYSICAL EXAMINATION:    BP 128/70   Pulse 70   Ht '5\' 4"'$  (1.626 m)   LMP 01/06/2016   SpO2 98%   BMI 32.27 kg/m     General appearance: alert, cooperative and appears stated age Head: Normocephalic, without obvious abnormality, atraumatic Neck: no adenopathy, supple, symmetrical, trachea midline and thyroid normal to inspection and palpation Lungs: clear to auscultation bilaterally Heart: regular rate and rhythm Abdomen: soft, non-tender, no masses,  no organomegaly Extremities: extremities normal, atraumatic, no cyanosis or edema Skin: Skin color, texture, turgor normal. No rashes or lesions Lymph nodes: Cervical, supraclavicular, and axillary nodes normal. No abnormal inguinal nodes palpated Neurologic: Grossly normal  Pelvic: External genitalia:  no lesions              Urethra:  normal appearing urethra with no masses, tenderness  or lesions              Bartholins and Skenes: normal                 Vagina: normal appearing vagina with normal color and discharge, no lesions              Cervix: no lesions                Bimanual Exam:  Uterus:  normal size, contour, position, consistency, mobility, non-tender              Adnexa: no mass, fullness, tenderness              Rectal exam: yes.  Confirms.              Anus:  normal sphincter tone, no lesions  Chaperone was present for exam:  Estill Bamberg, CMA.  ASSESSMENT   Postmenopausal bleeding.  Endometrial polyp. AGUS pap.  Endometrial polyp and negative ECC.   PLAN Discussion of postmenopausal bleeding, AGUS pap and endometrial polyp. Discussion of hysteroscopy with Myosure polypectomy, dilation and curettage.  Risks, benefits, and alternatives reviewed. Risks include but are not limited to bleeding, infection, damage to surrounding organs including uterine perforation requiring hospitalization and laparoscopy, pulmonary edema, reaction to anesthesia, DVT, PE, death, need for further treatment reviewed. Surgical expectations and recovery discussed.  Patient wishes to proceed.   An After Visit Summary was printed and given to the patient.   25 min  total time was spent for this patient encounter, including preparation, face-to-face counseling with the patient, coordination of care, and documentation of the encounter.

## 2021-05-07 ENCOUNTER — Other Ambulatory Visit: Payer: Self-pay

## 2021-05-07 ENCOUNTER — Encounter: Payer: Self-pay | Admitting: Obstetrics and Gynecology

## 2021-05-07 ENCOUNTER — Ambulatory Visit: Payer: BC Managed Care – PPO | Admitting: Obstetrics and Gynecology

## 2021-05-07 ENCOUNTER — Other Ambulatory Visit (HOSPITAL_COMMUNITY)
Admission: RE | Admit: 2021-05-07 | Discharge: 2021-05-07 | Disposition: A | Discharge status: Home / Self Care | Payer: BC Managed Care – PPO | Source: Ambulatory Visit | Attending: Obstetrics and Gynecology | Admitting: Obstetrics and Gynecology

## 2021-05-07 VITALS — Ht 64.0 in | Wt 188.0 lb

## 2021-05-07 DIAGNOSIS — N760 Acute vaginitis: Secondary | ICD-10-CM | POA: Diagnosis not present

## 2021-05-07 MED ORDER — FLUCONAZOLE 150 MG PO TABS: 150.0000 mg | ORAL_TABLET | Freq: Once | ORAL | 0 refills | Status: AC

## 2021-05-07 MED ORDER — NYSTATIN-TRIAMCINOLONE 100000-0.1 UNIT/GM-% EX CREA: 1.0000 "application " | TOPICAL_CREAM | Freq: Two times a day (BID) | CUTANEOUS | 0 refills | Status: DC

## 2021-05-07 NOTE — Patient Instructions (Signed)

## 2021-05-07 NOTE — Telephone Encounter (Signed)
Please schedule office visit with me.  

## 2021-05-07 NOTE — Progress Notes (Signed)
GYNECOLOGY  VISIT   HPI: 55 y.o.   Domestic Partner  Caucasian  female   [place] with Patient's last menstrual period was 01/06/2016.   here for vaginal itching and irritation.   Symptoms started in 2 days.   No discharge.  No odor.   No change in product use.   Has upcoming hysteroscopy procedure next week.  GYNECOLOGIC HISTORY: Patient's last menstrual period was 01/06/2016. Contraception: PMP Menopausal hormone therapy:  Dotti patch and Progesterone Last mammogram:  02-05-21 3D/Neg/Birads1 Last pap smear: 02-05-21 AGUS:Neg HR HPV, 01-23-20 Neg:Neg HR HPV, 12-26-18 AGUS:Neg HR HPV        OB History     Gravida  0   Para  0   Term  0   Preterm  0   AB  0   Living  0      SAB  0   IAB  0   Ectopic  0   Multiple  0   Live Births                 Patient Active Problem List   Diagnosis Date Noted   Atypical glandular cells of undetermined significance (AGUS) on cervical Pap smear 01/08/2019   Endometrial polyp 01/08/2019   Postmenopausal bleeding 12/26/2018   Lateral epicondylitis of right elbow 07/12/2018   Lumbar radiculopathy, right 07/12/2018   Piriformis syndrome of right side 06/08/2018   Status post cervical spinal fusion 01/27/2016   Headache 05/13/2015   Blurry vision 05/13/2015   Perceived hearing changes 05/13/2015   Migraine headache 04/29/2015   Raynaud phenomenon 08/28/2013   Depression, major, 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/2013   Allergic conjunctivitis 05/09/2013   Anxiety 03/09/2012   Insomnia 03/09/2012   Allergic rhinitis 03/09/2012    Past Medical History:  Diagnosis Date   Abnormal Pap smear    ASCUS 08/1999; CIN 11/1999, s/p conization, 12-26-2018 AGUS   Allergic rhinitis, cause unspecified    Anxiety    Complication of anesthesia    "difficulty awakening" remote past   DDD (degenerative disc disease), cervical 05/05/2018   Noted on imaging   DDD (degenerative disc disease), lumbar 06/08/2018   Noted on imaging   Depression     Endometrial polyp    Family history of adverse reaction to anesthesia    mother slow to awaken   GERD (gastroesophageal reflux disease)    Headache    Insomnia    PMB (postmenopausal bleeding) 05/08/2021   Post-nasal drip 05/08/2021   uses  mucinex daily for   Spondylolysis, lumbar region 07/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 Rhodia Albright - [facility] - Moderate to severe dysplasia, negative margins   COLONOSCOPY  09/2015   DILATATION & CURETTAGE/HYSTEROSCOPY WITH MYOSURE N/A 01/23/2019   Procedure: [place];  Surgeon: Nunzio Cobbs, MD;  Location[facility];  Service: Gynecology;  Laterality: N/A;   LUMBAR DISC SURGERY  10/2018   LUMBAR MICRODISCECTOMY Right 11/09/2018  lumbar level:L5-S1 Utilizing microscope-Dr.Gary Cram   NASAL SEPTOPLASTY W/ TURBINOPLASTY Bilateral 10/07/2015   Procedure: NASAL SEPTOPLASTY WITH BILATERAL TURBINATE REDUCTION;  Surgeon: Leta Baptist, MD;  Location: [place];  Service: ENT;  Laterality: Bilateral;   PILONIDAL CYST EXCISION  1988   WISDOM TOOTH EXTRACTION     yrs ago    Current Outpatient Medications  Medication Sig Dispense Refill   ALPRAZolam (XANAX) 0.5 MG tablet TAKE 1/2 TO 1 TABLET BY MOUTH THREE TIMES DAILY AS NEEDED FOR ANXIETY 20 tablet 0   azelastine (ASTELIN) 0.1 % nasal spray 2 (two) times daily.      buPROPion (WELLBUTRIN XL) 300 MG 24 hr tablet TAKE 1 TABLET BY MOUTH EVERY MORNING 90 tablet  3   cetirizine (ZYRTEC) 10 MG tablet Take 10 mg by mouth daily.     Cholecalciferol (VITAMIN D) 2000 UNITS tablet Take 2,000 Units by mouth daily.     citalopram (CELEXA) 20 MG tablet TAKE 1 TABLET BY MOUTH ONCE DAILY (Patient taking differently: every evening. take 1 tablet by mouth once daily) 90 tablet 3   EPINEPHrine 0.3 mg/0.3 mL IJ SOAJ injection Inject into the muscle as directed.     estradiol (DOTTI) 0.0375 MG/24HR Place 1 patch onto the skin 2 (two) times a week. 24 patch 3   famotidine (PEPCID) 20 MG tablet Take 20 mg by mouth as needed.     fluticasone (FLONASE) 50 MCG/ACT nasal spray INSTILL 2 SPRAYS INTO EACH NOSTRIL twice a day  0   gabapentin (NEURONTIN) 100 MG capsule TAKE 2 CAPSULES(200 MG) BY MOUTH AT BEDTIME 180 capsule 1   guaiFENesin (MUCINEX) 600 MG 12 hr tablet Take 1,200 mg by mouth 2 (two) times daily.     levocetirizine (XYZAL) 5 MG tablet TAKE 1 TABLET BY MOUTH EVERY EVENING 90 tablet 3   montelukast (SINGULAIR) 10 MG tablet SMARTSIG:1 Tablet(s) By Mouth Every Evening     nystatin-triamcinolone (MYCOLOG II) cream Apply 1 application topically 2 (two) times daily. Apply to affected area BID for up to 7 days. 60 g 0   Olopatadine HCl 0.2 % SOLN daily as needed.     progesterone (PROMETRIUM) 100 MG capsule TAKE 1 CAPSULE(100 MG) BY MOUTH DAILY (Patient taking differently: at bedtime. TAKE 1 CAPSULE(100 MG) BY MOUTH DAILY) 90 capsule 3   pseudoephedrine (SUDAFED) 30 MG tablet Take 30 mg by mouth every 4 (four) hours as needed for congestion.     zolpidem (AMBIEN) 10 MG tablet TAKE 1/2 TO 1 TABLET BY MOUTH EVERY DAY AT BEDTIME AS NEEDED FOR SLEEP 30 tablet 2   UNABLE TO FIND Allergy injection q week at dr [facility] Hedge offfice     No current facility-administered medications for this visit.     ALLERGIES: Codeine and Pamelor [nortriptyline]  Family History  Problem Relation Age of Onset   Hypertension Mother    Diabetes Mother    GER disease Mother    Heart disease  Father    Depression Sister    Hypertension Sister    Breast cancer Cousin    Colon cancer Maternal Grandmother         died at 15   Diabetes Maternal Grandmother    Alzheimer's disease Paternal Grandmother    Colon polyps Maternal Uncle    GER disease Maternal Uncle    Migraines Neg Hx     Social History   Socioeconomic History   Marital status: Soil scientist    Spouse name: Not on file  Number of children: 0   Years of education: 16   Highest education level: Not on file  Occupational History   Occupation: Naval architect: [employer]  Tobacco Use   Smoking status: Never   Smokeless tobacco: Never  Vaping Use   Vaping Use: Never used  Substance and Sexual Activity   Alcohol use: No    Alcohol/week: 0.0 standard drinks   Drug use: Never   Sexual activity: Not Currently    Partners: Female    Birth control/protection: Abstinence, Post-menopausal  Other Topics Concern   Not on file  Social History Narrative   Lives with female partner Juliann Pulse), 2 beagles   Social Determinants of Health   Financial Resource Strain: Not on file  Food Insecurity: Not on file  Transportation Needs: Not on file  Physical Activity: Not on file  Stress: Not on file  Social Connections: Not on file  Intimate Partner Violence: Not on file    Review of Systems  Genitourinary:        Vaginal irritation/itching  All other systems reviewed and are negative.  PHYSICAL EXAMINATION:    Ht '5\' 4"'$  (1.626 m)   Wt 188 lb (85.3 kg)   LMP 01/06/2016   BMI 32.27 kg/m     General appearance: alert, cooperative and appears stated age   Pelvic: External genitalia:  rosy erythema of vulva and slit between labia major and minora.               Urethra:  normal appearing urethra with no masses, tenderness or lesions              Bartholins and Skenes: normal                 Vagina: normal appearing vagina with normal color and discharge, no lesions              Cervix: no  lesions                Bimanual Exam:  Uterus:  normal size, contour, position, consistency, mobility, non-tender              Adnexa: no mass, fullness, tenderness             Chaperone was present for exam:  Estill Bamberg, CMA  ASSESSMENT  Vulvovaginitis.  Looks classic for yeast.   PLAN  Vaginitis swab sent.  Will treat with Diflucan 150 mg po x 1 and repeat in 72 hours prn. Mycolog II bid x 1 week.  Ok to proceed with hysteroscopy next week.    An After Visit Summary was printed and given to the patient.

## 2021-05-08 ENCOUNTER — Encounter (HOSPITAL_BASED_OUTPATIENT_CLINIC_OR_DEPARTMENT_OTHER): Payer: Self-pay | Admitting: Obstetrics and Gynecology

## 2021-05-08 ENCOUNTER — Other Ambulatory Visit: Payer: Self-pay

## 2021-05-08 DIAGNOSIS — N950 Postmenopausal bleeding: Secondary | ICD-10-CM

## 2021-05-08 DIAGNOSIS — R0982 Postnasal drip: Secondary | ICD-10-CM

## 2021-05-08 HISTORY — DX: Postmenopausal bleeding: N95.0

## 2021-05-08 HISTORY — DX: Postnasal drip: R09.82

## 2021-05-08 LAB — CERVICOVAGINAL ANCILLARY ONLY
Bacterial Vaginitis (gardnerella): NEGATIVE
Candida Glabrata: NEGATIVE
Candida Vaginitis: POSITIVE — AB
Comment: NEGATIVE
Comment: NEGATIVE
Comment: NEGATIVE
Comment: NEGATIVE
Trichomonas: NEGATIVE

## 2021-05-08 NOTE — Progress Notes (Signed)
Spoke w/ via phone for pre-op interview---pt Lab needs dos----    none per anesthesia, surgery orders pending           Lab results------none COVID test -----patient states asymptomatic no test needed Arrive at -------530 am 05-13-2021 NPO after MN NO Solid Food.  Clear liquids from MN until---430 am Med rec completed Medications to take morning of surgery -----alprazolam prn, bupropion, estradiol patch, astelin nasal spray, flonase nasal spray, certrizine, pepcid, mucinex Diabetic medication ----- Patient instructed no nail polish to be worn day of surgery Patient instructed to bring photo id and insurance card day of surgery Patient aware to have Driver (ride ) / caregiver    for 24 hours after surgery  Patient Special Instructions -----none Pre-Op special Istructions -----none Patient verbalized understanding of instructions that were given at this phone interview. Patient denies shortness of breath, chest pain, fever, cough at this phone interview.

## 2021-05-11 NOTE — H&P (Signed)
Office Visit 05/05/2021 [facility], MD Obstetrics and Gynecology Postmenopausal bleeding +2 more Dx Pre-op Exam ; Referred by Rita Ohara, MD Reason for Visit   Additional Documentation  Vitals:  BP 128/70 Pulse 70 Ht '5\' 4"'$  (1.626 m) LMP 01/06/2016 SpO2 98% BMI 32.27 kg/m BSA 1.96 m  Flowsheets:  NEWS, MEWS Score   Encounter Info:  Billing Info, History, Allergies, Detailed Report    All Notes    Progress Notes by Nunzio Cobbs, MD at 05/05/2021 8:30 AM  Author: Nunzio Cobbs, MD Author Type: Physician Filed: 05/05/2021  9:06 AM  Note Status: Signed Cosign: Cosign Not Required Encounter Date: 05/05/2021  Editor: Nunzio Cobbs, MD (Physician)      Prior Versions: 1. Lowella Fairy, CMA (Certified Psychologist, sport and exercise) at 05/05/2021  8:39 AM - Sign when Signing Visit    GYNECOLOGY  VISIT   HPI: 55 y.o.   Domestic Partner  Caucasian  female   [place] with Patient's last menstrual period was 01/06/2016.   here for Pre-op exam.   Patient had postmenopausal bleeding on HRT and an AGUS pap. Her endometrial biopsy confirmed a benign polyp and sonohysterogram confirmed a filling defect 19 x 9 mm in size.  Her colposcopy with ECC showed benign endocervical cells.    No further bleeding.    She has a history of CIN III in 2000 and a prior benign endometrial polyp in 2020.   GYNECOLOGIC HISTORY: Patient's last menstrual period was 01/06/2016. Contraception:  PMP Menopausal hormone therapy:  Dotti patch and Progesterone Last mammogram:  02-05-21 3D/Neg/Birads1 Last pap smear: 02-05-21 AGUS:Neg HR HPV, 01-23-20 Neg:Neg HR HPV, 12-26-18 AGUS:Neg HR HPV        OB History       Gravida  0   Para  0   Term  0   Preterm  0   AB  0   Living  0        SAB  0   IAB  0   Ectopic  0   Multiple  0   Live Births                        Patient Active Problem List    Diagnosis Date  Noted   Atypical glandular cells of undetermined significance (AGUS) on cervical Pap smear 01/08/2019   Endometrial polyp 01/08/2019   Postmenopausal bleeding 12/26/2018   Lateral epicondylitis of right elbow 07/12/2018   Lumbar radiculopathy, right 07/12/2018   Piriformis syndrome of right side 06/08/2018   Status post cervical spinal fusion 01/27/2016   Headache 05/13/2015   Blurry vision 05/13/2015   Perceived hearing changes 05/13/2015   Migraine headache 04/29/2015   Raynaud phenomenon 08/28/2013   Depression, major, 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   Allergic conjunctivitis 05/09/2013   Anxiety 03/09/2012   Insomnia 03/09/2012   Allergic rhinitis 03/09/2012          Past Medical History:  Diagnosis Date   Abnormal Pap smear      ASCUS 08/1999; CIN 11/1999, s/p conization, 12-26-2018 AGUS   Allergic rhinitis, cause unspecified     Anxiety     Complication of anesthesia      "difficulty awakening" remote past   DDD (degenerative disc disease), cervical 05/05/2018    Noted on imaging   DDD (degenerative disc disease), lumbar 06/08/2018    Noted on imaging  Depression     Endometrial polyp     Headache     Insomnia     PMB (postmenopausal bleeding)     Spondylolysis, 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 Rhodia Albright - [facility] - Moderate to severe dysplasia, negative margins   COLONOSCOPY   09/2015   DILATATION & CURETTAGE/HYSTEROSCOPY WITH MYOSURE N/A 01/23/2019    Procedure:  Perla;  Surgeon: Nunzio Cobbs, MD;  Location: [facility];  Service: Gynecology;  Laterality: N/A;   LUMBAR DISC SURGERY   10/2018   LUMBAR MICRODISCECTOMY Right 11/09/2018    lumbar level:L5-S1 Utilizing microscope-Dr.Gary Cram   NASAL SEPTOPLASTY W/ TURBINOPLASTY Bilateral 10/07/2015    Procedure: NASAL SEPTOPLASTY WITH BILATERAL TURBINATE REDUCTION;  Surgeon: Leta Baptist, MD;  Location: [place];  Service: ENT;  Laterality: Bilateral;   PILONIDAL CYST EXCISION       WISDOM TOOTH EXTRACTION                Current Outpatient Medications  Medication Sig Dispense Refill   albuterol (VENTOLIN HFA) 108 (90 Base) MCG/ACT inhaler 1 puff as needed       ALPRAZolam (XANAX) 0.5 MG tablet TAKE 1/2 TO 1 TABLET BY MOUTH THREE TIMES DAILY AS NEEDED FOR ANXIETY 20 tablet 0   azelastine (ASTELIN) 0.1 % nasal spray 2 (two) times daily.        buPROPion (WELLBUTRIN XL) 300 MG 24 hr tablet TAKE 1 TABLET BY MOUTH EVERY MORNING 90 tablet 3   cetirizine (ZYRTEC) 10 MG tablet Take 10 mg by mouth daily.       Cholecalciferol (VITAMIN D) 2000 UNITS tablet Take 2,000 Units by mouth daily.       citalopram (CELEXA) 20 MG tablet TAKE 1 TABLET BY MOUTH ONCE DAILY 90 tablet 3   EPINEPHrine 0.3 mg/0.3 mL IJ SOAJ injection Inject into the muscle as directed.       estradiol (DOTTI) 0.0375 MG/24HR Place 1 patch onto the skin 2 (two) times a week. 24 patch 3   famotidine (PEPCID) 20 MG tablet Take 20 mg by mouth 2 (two) times daily.       fluticasone (FLONASE) 50 MCG/ACT nasal spray INSTILL 2 SPRAYS INTO EACH NOSTRIL twice a day   0   gabapentin (NEURONTIN) 100 MG capsule TAKE 2 CAPSULES(200 MG) BY MOUTH AT BEDTIME 180 capsule 1   guaiFENesin (MUCINEX) 600 MG 12 hr tablet Take 1,200 mg by mouth 2 (two) times daily.       levocetirizine (XYZAL) 5 MG tablet TAKE 1 TABLET BY MOUTH EVERY EVENING 90 tablet 3   montelukast (SINGULAIR) 10 MG  tablet SMARTSIG:1 Tablet(s) By Mouth Every Evening       Olopatadine HCl 0.2 % SOLN 1 drop into affected eye       progesterone (PROMETRIUM) 100 MG capsule TAKE  1 CAPSULE(100 MG) BY MOUTH DAILY 90 capsule 3   pseudoephedrine (SUDAFED) 30 MG tablet Take 30 mg by mouth every 4 (four) hours as needed for congestion.       zolpidem (AMBIEN) 10 MG tablet TAKE 1/2 TO 1 TABLET BY MOUTH EVERY DAY AT BEDTIME AS NEEDED FOR SLEEP 30 tablet 2    No current facility-administered medications for this visit.      ALLERGIES: Codeine and Pamelor [nortriptyline]        Family History  Problem Relation Age of Onset   Hypertension Mother     Diabetes Mother     GER disease Mother     Heart disease Father     Depression Sister     Hypertension Sister     Breast cancer Cousin     Colon cancer Maternal Grandmother           died at 21   Diabetes Maternal Grandmother     Alzheimer's disease Paternal Grandmother     Colon polyps Maternal Uncle     GER disease Maternal Uncle     Migraines Neg Hx        Social History         Socioeconomic History   Marital status: Soil scientist      Spouse name: Not on file   Number of children: 0   Years of education: 28   Highest education level: Not on file  Occupational History   Occupation: Engineer, water: [employer]   Tobacco Use   Smoking status: Never   Smokeless tobacco: Never  Vaping Use   Vaping Use: Never used  Substance and Sexual Activity   Alcohol use: No      Alcohol/week: 0.0 standard drinks   Drug use: Never   Sexual activity: Not Currently      Partners: Female      Birth control/protection: Abstinence, Post-menopausal  Other Topics Concern   Not on file  Social History Narrative    Lives with female partner Juliann Pulse), 2 beagles    Social Determinants of Health    Financial Resource Strain: Not on file  Food Insecurity: Not on file  Transportation Needs: Not on file  Physical Activity: Not on file  Stress:  Not on file  Social Connections: Not on file  Intimate Partner Violence: Not on file      Review of Systems  All other systems reviewed and are negative.   PHYSICAL EXAMINATION:     BP 128/70   Pulse 70   Ht '5\' 4"'$  (1.626 m)   LMP 01/06/2016   SpO2 98%   BMI 32.27 kg/m     General appearance: alert, cooperative and appears stated age Head: Normocephalic, without obvious abnormality, atraumatic Neck: no adenopathy, supple, symmetrical, trachea midline and thyroid normal to inspection and palpation Lungs: clear to auscultation bilaterally Heart: regular rate and rhythm Abdomen: soft, non-tender, no masses,  no organomegaly Extremities: extremities normal, atraumatic, no cyanosis or edema Skin: Skin color, texture, turgor normal. No rashes or lesions Lymph nodes: Cervical, supraclavicular, and axillary nodes normal. No abnormal inguinal nodes palpated Neurologic: Grossly normal   Pelvic: External genitalia:  no lesions              Urethra:  normal appearing urethra with no masses, tenderness or lesions              Bartholins and Skenes: normal  Vagina: normal appearing vagina with normal color and discharge, no lesions              Cervix: no lesions                Bimanual Exam:  Uterus:  normal size, contour, position, consistency, mobility, non-tender              Adnexa: no mass, fullness, tenderness              Rectal exam: yes.  Confirms.              Anus:  normal sphincter tone, no lesions   Chaperone was present for exam:  Estill Bamberg, CMA.   ASSESSMENT   Postmenopausal bleeding.  Endometrial polyp. AGUS pap.  Endometrial polyp and negative ECC.    PLAN Discussion of postmenopausal bleeding, AGUS pap and endometrial polyp. Discussion of hysteroscopy with Myosure polypectomy, dilation and curettage.  Risks, benefits, and alternatives reviewed. Risks include but are not limited to bleeding, infection, damage to surrounding organs including uterine  perforation requiring hospitalization and laparoscopy, pulmonary edema, reaction to anesthesia, DVT, PE, death, need for further treatment reviewed. Surgical expectations and recovery discussed.  Patient wishes to proceed.   An After Visit Summary was printed and given to the patient.    25 min  total time was spent for this patient encounter, including preparation, face-to-face counseling with the patient, coordination of care, and documentation of the encounter.

## 2021-05-13 ENCOUNTER — Encounter (HOSPITAL_BASED_OUTPATIENT_CLINIC_OR_DEPARTMENT_OTHER)
Admission: RE | Disposition: A | Discharge status: Home / Self Care | Payer: Self-pay | Source: Home / Self Care | Attending: Obstetrics and Gynecology

## 2021-05-13 ENCOUNTER — Ambulatory Visit (HOSPITAL_BASED_OUTPATIENT_CLINIC_OR_DEPARTMENT_OTHER): Payer: BC Managed Care – PPO | Admitting: Anesthesiology

## 2021-05-13 ENCOUNTER — Ambulatory Visit (HOSPITAL_BASED_OUTPATIENT_CLINIC_OR_DEPARTMENT_OTHER)
Admission: RE | Admit: 2021-05-13 | Discharge: 2021-05-13 | Disposition: A | Discharge status: Home / Self Care | Payer: BC Managed Care – PPO | Attending: Obstetrics and Gynecology | Admitting: Obstetrics and Gynecology

## 2021-05-13 ENCOUNTER — Encounter (HOSPITAL_BASED_OUTPATIENT_CLINIC_OR_DEPARTMENT_OTHER): Payer: Self-pay | Admitting: Obstetrics and Gynecology

## 2021-05-13 ENCOUNTER — Other Ambulatory Visit: Payer: Self-pay

## 2021-05-13 DIAGNOSIS — Z800 Family history of malignant neoplasm of digestive organs: Secondary | ICD-10-CM | POA: Diagnosis not present

## 2021-05-13 DIAGNOSIS — Z8249 Family history of ischemic heart disease and other diseases of the circulatory system: Secondary | ICD-10-CM | POA: Insufficient documentation

## 2021-05-13 DIAGNOSIS — N840 Polyp of corpus uteri: Secondary | ICD-10-CM

## 2021-05-13 DIAGNOSIS — Z79899 Other long term (current) drug therapy: Secondary | ICD-10-CM | POA: Diagnosis not present

## 2021-05-13 DIAGNOSIS — Z8371 Family history of colonic polyps: Secondary | ICD-10-CM | POA: Diagnosis not present

## 2021-05-13 DIAGNOSIS — Z833 Family history of diabetes mellitus: Secondary | ICD-10-CM | POA: Insufficient documentation

## 2021-05-13 DIAGNOSIS — Z820 Family history of epilepsy and other diseases of the nervous system: Secondary | ICD-10-CM | POA: Insufficient documentation

## 2021-05-13 DIAGNOSIS — N950 Postmenopausal bleeding: Secondary | ICD-10-CM

## 2021-05-13 DIAGNOSIS — Z803 Family history of malignant neoplasm of breast: Secondary | ICD-10-CM | POA: Insufficient documentation

## 2021-05-13 HISTORY — DX: Gastro-esophageal reflux disease without esophagitis: K21.9

## 2021-05-13 HISTORY — PX: DILATATION & CURETTAGE/HYSTEROSCOPY WITH MYOSURE: SHX6511

## 2021-05-13 HISTORY — DX: Family history of other specified conditions: Z84.89

## 2021-05-13 LAB — CBC
HCT: 41.8 % (ref 36.0–46.0)
Hemoglobin: 14.2 g/dL (ref 12.0–15.0)
MCH: 30.4 pg (ref 26.0–34.0)
MCHC: 34 g/dL (ref 30.0–36.0)
MCV: 89.5 fL (ref 80.0–100.0)
Platelets: 324 10*3/uL (ref 150–400)
RBC: 4.67 MIL/uL (ref 3.87–5.11)
RDW: 12.9 % (ref 11.5–15.5)
WBC: 6.3 10*3/uL (ref 4.0–10.5)
nRBC: 0 % (ref 0.0–0.2)

## 2021-05-13 SURGERY — DILATATION & CURETTAGE/HYSTEROSCOPY WITH MYOSURE
Anesthesia: General | Site: Vagina

## 2021-05-13 MED ORDER — MIDAZOLAM HCL 2 MG/2ML IJ SOLN
INTRAMUSCULAR | Status: AC
  Filled 2021-05-13: qty 2

## 2021-05-13 MED ORDER — OXYCODONE HCL 5 MG/5ML PO SOLN
5.0000 mg | Freq: Once | ORAL | Status: DC | PRN
Start: 2021-05-13 — End: 2021-05-13

## 2021-05-13 MED ORDER — OXYCODONE HCL 5 MG PO TABS: 5.0000 mg | ORAL_TABLET | Freq: Once | ORAL | Status: DC | PRN

## 2021-05-13 MED ORDER — IBUPROFEN 800 MG PO TABS: 800.0000 mg | ORAL_TABLET | Freq: Three times a day (TID) | ORAL | 0 refills | Status: DC | PRN

## 2021-05-13 MED ORDER — ONDANSETRON HCL 4 MG/2ML IJ SOLN: 4.0000 mg | Freq: Once | INTRAMUSCULAR | Status: DC | PRN

## 2021-05-13 MED ORDER — GLYCOPYRROLATE PF 0.2 MG/ML IJ SOSY
PREFILLED_SYRINGE | INTRAMUSCULAR | Status: AC
  Filled 2021-05-13: qty 1

## 2021-05-13 MED ORDER — PROPOFOL 10 MG/ML IV BOLUS
INTRAVENOUS | Status: AC
  Filled 2021-05-13: qty 20

## 2021-05-13 MED ORDER — KETOROLAC TROMETHAMINE 30 MG/ML IJ SOLN
INTRAMUSCULAR | Status: DC | PRN
  Administered 2021-05-13: 30 mg via INTRAVENOUS

## 2021-05-13 MED ORDER — DEXAMETHASONE SODIUM PHOSPHATE 10 MG/ML IJ SOLN
INTRAMUSCULAR | Status: AC
  Filled 2021-05-13: qty 1

## 2021-05-13 MED ORDER — LACTATED RINGERS IV SOLN: INTRAVENOUS | Status: DC

## 2021-05-13 MED ORDER — LIDOCAINE HCL 1 % IJ SOLN
INTRAMUSCULAR | Status: DC | PRN
  Administered 2021-05-13: 10 mL

## 2021-05-13 MED ORDER — EPHEDRINE SULFATE-NACL 50-0.9 MG/10ML-% IV SOSY
PREFILLED_SYRINGE | INTRAVENOUS | Status: DC | PRN
  Administered 2021-05-13 (×2): 10 mg via INTRAVENOUS

## 2021-05-13 MED ORDER — FENTANYL CITRATE (PF) 100 MCG/2ML IJ SOLN
INTRAMUSCULAR | Status: DC | PRN
  Administered 2021-05-13: 50 ug via INTRAVENOUS

## 2021-05-13 MED ORDER — ACETAMINOPHEN 500 MG PO TABS
1000.0000 mg | ORAL_TABLET | ORAL | Status: AC
  Administered 2021-05-13: 1000 mg via ORAL

## 2021-05-13 MED ORDER — DEXAMETHASONE SODIUM PHOSPHATE 10 MG/ML IJ SOLN
INTRAMUSCULAR | Status: DC | PRN
  Administered 2021-05-13: 10 mg via INTRAVENOUS

## 2021-05-13 MED ORDER — KETOROLAC TROMETHAMINE 30 MG/ML IJ SOLN
INTRAMUSCULAR | Status: AC
  Filled 2021-05-13: qty 1

## 2021-05-13 MED ORDER — LIDOCAINE 2% (20 MG/ML) 5 ML SYRINGE
INTRAMUSCULAR | Status: DC | PRN
  Administered 2021-05-13: 100 mg via INTRAVENOUS

## 2021-05-13 MED ORDER — POVIDONE-IODINE 10 % EX SWAB: 2.0000 "application " | Freq: Once | CUTANEOUS | Status: DC

## 2021-05-13 MED ORDER — PROPOFOL 10 MG/ML IV BOLUS
INTRAVENOUS | Status: DC | PRN
  Administered 2021-05-13: 200 mg via INTRAVENOUS

## 2021-05-13 MED ORDER — ONDANSETRON HCL 4 MG/2ML IJ SOLN
INTRAMUSCULAR | Status: DC | PRN
Start: 2021-05-13 — End: 2021-05-13
  Administered 2021-05-13: 4 mg via INTRAVENOUS

## 2021-05-13 MED ORDER — FENTANYL CITRATE (PF) 250 MCG/5ML IJ SOLN
INTRAMUSCULAR | Status: AC
  Filled 2021-05-13: qty 5

## 2021-05-13 MED ORDER — FENTANYL CITRATE (PF) 100 MCG/2ML IJ SOLN: 25.0000 ug | INTRAMUSCULAR | Status: DC | PRN

## 2021-05-13 MED ORDER — ACETAMINOPHEN 500 MG PO TABS
ORAL_TABLET | ORAL | Status: AC
  Filled 2021-05-13: qty 2

## 2021-05-13 MED ORDER — ONDANSETRON HCL 4 MG/2ML IJ SOLN
INTRAMUSCULAR | Status: AC
  Filled 2021-05-13: qty 2

## 2021-05-13 MED ORDER — ROCURONIUM BROMIDE 10 MG/ML (PF) SYRINGE
PREFILLED_SYRINGE | INTRAVENOUS | Status: AC
  Filled 2021-05-13: qty 10

## 2021-05-13 MED ORDER — AMISULPRIDE (ANTIEMETIC) 5 MG/2ML IV SOLN: 10.0000 mg | Freq: Once | INTRAVENOUS | Status: DC | PRN

## 2021-05-13 MED ORDER — FENTANYL CITRATE (PF) 100 MCG/2ML IJ SOLN
INTRAMUSCULAR | Status: AC
  Filled 2021-05-13: qty 2

## 2021-05-13 MED ORDER — PHENYLEPHRINE 40 MCG/ML (10ML) SYRINGE FOR IV PUSH (FOR BLOOD PRESSURE SUPPORT)
PREFILLED_SYRINGE | INTRAVENOUS | Status: AC
  Filled 2021-05-13: qty 30

## 2021-05-13 MED ORDER — MIDAZOLAM HCL 5 MG/5ML IJ SOLN
INTRAMUSCULAR | Status: DC | PRN
  Administered 2021-05-13: 2 mg via INTRAVENOUS

## 2021-05-13 MED ORDER — LIDOCAINE HCL (PF) 2 % IJ SOLN
INTRAMUSCULAR | Status: AC
  Filled 2021-05-13: qty 5

## 2021-05-13 MED ORDER — EPHEDRINE 5 MG/ML INJ
INTRAVENOUS | Status: AC
  Filled 2021-05-13: qty 5

## 2021-05-13 MED ORDER — GLYCOPYRROLATE PF 0.2 MG/ML IJ SOSY
PREFILLED_SYRINGE | INTRAMUSCULAR | Status: DC | PRN
  Administered 2021-05-13: .2 mg via INTRAVENOUS

## 2021-05-13 MED ORDER — SODIUM CHLORIDE 0.9 % IR SOLN
Status: DC | PRN
  Administered 2021-05-13: 3000 mL

## 2021-05-13 SURGICAL SUPPLY — 16 items
CATH ROBINSON RED A/P 16FR (CATHETERS) ×2 IMPLANT
DEVICE MYOSURE REACH (MISCELLANEOUS) ×1 IMPLANT
GAUZE 4X4 16PLY ~~LOC~~+RFID DBL (SPONGE) ×3 IMPLANT
GLOVE SURG ENC MOIS LTX SZ6.5 (GLOVE) ×2 IMPLANT
GLOVE SURG LTX SZ6.5 (GLOVE) ×1 IMPLANT
GLOVE SURG UNDER POLY LF SZ6.5 (GLOVE) ×1 IMPLANT
GLOVE SURG UNDER POLY LF SZ7 (GLOVE) ×1 IMPLANT
GOWN STRL REUS W/TWL LRG LVL3 (GOWN DISPOSABLE) ×3 IMPLANT
IV NS IRRIG 3000ML ARTHROMATIC (IV SOLUTION) ×2 IMPLANT
KIT PROCEDURE FLUENT (KITS) ×2 IMPLANT
KIT TURNOVER CYSTO (KITS) ×2 IMPLANT
PACK VAGINAL MINOR WOMEN LF (CUSTOM PROCEDURE TRAY) ×2 IMPLANT
PAD OB MATERNITY 4.3X12.25 (PERSONAL CARE ITEMS) ×2 IMPLANT
SEAL ROD LENS SCOPE MYOSURE (ABLATOR) ×2 IMPLANT
SOL PREP POV-IOD 4OZ 10% (MISCELLANEOUS) ×1 IMPLANT
TOWEL OR 17X26 10 PK STRL BLUE (TOWEL DISPOSABLE) ×2 IMPLANT

## 2021-05-13 NOTE — Discharge Instructions (Addendum)
Hi Brandi Carpenter,   I found and removed two polyps.   Everything went well today!  I will see you in about 2 weeks for your post operative visit.   [name], MD    Post Anesthesia Home Care Instructions  Activity: Get plenty of rest for the remainder of the day. A responsible individual must stay with you for 24 hours following the procedure.  For the next 24 hours, DO NOT: -Drive a car -Paediatric nurse -Drink alcoholic beverages -Take any medication unless instructed by your physician -Make any legal decisions or sign important papers.  Meals: Start with liquid foods such as gelatin or soup. Progress to regular foods as tolerated. Avoid greasy, spicy, heavy foods. If nausea and/or vomiting occur, drink only clear liquids until the nausea and/or vomiting subsides. Call your physician if vomiting continues.  Special Instructions/Symptoms: Your throat may feel dry or sore from the anesthesia or the breathing tube placed in your throat during surgery. If this causes discomfort, gargle with warm salt water. The discomfort should disappear within 24 hours.  If you had a scopolamine patch placed behind your ear for the management of post- operative nausea and/or vomiting:  1. The medication in the patch is effective for 72 hours, after which it should be removed.  Wrap patch in a tissue and discard in the trash. Wash hands thoroughly with soap and water. 2. You may remove the patch earlier than 72 hours if you experience unpleasant side effects which may include dry mouth, dizziness or visual disturbances. 3. Avoid touching the patch. Wash your hands with soap and water after contact with the patch.

## 2021-05-13 NOTE — Anesthesia Preprocedure Evaluation (Signed)
Anesthesia Evaluation  Patient identified by MRN, date of birth, ID band Patient awake    Reviewed: Allergy & Precautions, NPO status , Patient's Chart, lab work & pertinent test results  History of Anesthesia Complications Negative for: history of anesthetic complications  Airway Mallampati: III  TM Distance: >3 FB Neck ROM: Full    Dental  (+) Dental Advisory Given, Teeth Intact   Pulmonary neg pulmonary ROS,    Pulmonary exam normal        Cardiovascular negative cardio ROS Normal cardiovascular exam     Neuro/Psych Anxiety Depression    GI/Hepatic Neg liver ROS, GERD  ,  Endo/Other  negative endocrine ROS  Renal/GU negative Renal ROS  negative genitourinary   Musculoskeletal  (+) Arthritis ,   Abdominal   Peds  Hematology negative hematology ROS (+)   Anesthesia Other Findings   Reproductive/Obstetrics Postmenopausal bleeding, endometrial polyp                            Anesthesia Physical Anesthesia Plan  ASA: 2  Anesthesia Plan: General   Post-op Pain Management:    Induction: Intravenous  PONV Risk Score and Plan: 3 and Ondansetron, Dexamethasone, Midazolam and Treatment may vary due to age or medical condition  Airway Management Planned: LMA  Additional Equipment: None  Intra-op Plan:   Post-operative Plan: Extubation in OR  Informed Consent: I have reviewed the patients History and Physical, chart, labs and discussed the procedure including the risks, benefits and alternatives for the proposed anesthesia with the patient or authorized representative who has indicated his/her understanding and acceptance.     Dental advisory given  Plan Discussed with:   Anesthesia Plan Comments:         Anesthesia Quick Evaluation

## 2021-05-13 NOTE — Anesthesia Procedure Notes (Signed)
Procedure Name: LMA Insertion Date/Time: 05/13/2021 7:22 AM Performed by: Rogers Blocker, CRNA Pre-anesthesia Checklist: Patient identified, Emergency Drugs available, Suction available and Patient being monitored Patient Re-evaluated:Patient Re-evaluated prior to induction Oxygen Delivery Method: Circle System Utilized Preoxygenation: Pre-oxygenation with 100% oxygen Induction Type: IV induction Ventilation: Mask ventilation without difficulty LMA: LMA inserted LMA Size: 4.0 Number of attempts: 1 Placement Confirmation: positive ETCO2 Tube secured with: Tape Dental Injury: Teeth and Oropharynx as per pre-operative assessment

## 2021-05-13 NOTE — Op Note (Signed)
OPERATIVE REPORT  PREOPERATIVE DIAGNOSES:   Postmenopausal bleeding, endometrial polyp, AGUS pap, negative high risk HPV  POSTOPERATIVE DIAGNOSES:   Postmenopausal bleeding, endometrial polyp, AGUS pap, negative high risk HPV  PROCEDURE:  Hysteroscopy with dilation and curettage, endocervical curettage, and Myosure resection of endometrial polyps  SURGEON:  Lenard Galloway, MD  ANESTHESIA:  LMA, paracervical block with 10 mL of 1% lidocaine.  IV FLUIDS:  700 cc LR  EBL:  2 cc  URINE OUTPUT:   20 cc by I and O catheterization  NORMAL SALINE DEFICIT:   50 cc  COMPLICATIONS:  None.  INDICATIONS FOR THE PROCEDURE:     The patient is a 55 year old G4P0 Caucasian female who presents with postmenopausal bleeding on hormone therapy.  She had an AGUS pap with negative high risk HPV.  Sonohysterogram showed a 19 x 9 mm mass and endometrial biopsy showed a benign polyp.  Her colposcopy with ECC showed benign endocervical cells.  The patient has a history of CIN III and has had a conization of the cervix.   A plan is now made to proceed with a hysteroscopy with dilation and curettage and Myosurgical resection of endometrial polyp; after risks, benefits and alternatives were reviewed.  FINDINGS:  Exam under anesthesia revealed a small anteverted, mobile uterus.  No adnexal masses were noted.  The uterus was sounded to 7.5 cm.  Hysteroscopy showed a 1.5 x 1 cm right right fundal polyp and a 0.5 cm left fundal polyp. The tubal ostia regions were visualized.  There was a cribiform pattern over the right tubal ostia.  The left tubal ostia was unremarkable. Endocervical curettings were scant. Endometrial currettings were scant.  SPECIMENS:   Endometrial polyp, endocervical curettings, and endometrial curettings were sent to Pathology separately.  PROCEDURE IN DETAIL:  The patient was reidentified in the preoperative hold area.  She received TED hose and PAS stockings for DVT prophylaxis.  In  the operating room, the patient was placed in the dorsal lithotomy position and then an LMA anesthetic was introduced.  [name] lower abdomen, vagina and perineum were sterilely prepped with Betadine and the  patient's bladder was catheterized of urine.  She was sterilly draped.  An exam under anesthesia was performed.  A speculum was placed inside the vagina and a single-tooth tenaculum was placed on the anterior cervical lip.  A paracervical block was performed with a total of 10 mL of 1% lidocaine plain.  The uterus was sounded. The cervix was dilated to a #21 Pratt dilator.  The MyoSure hysteroscope was then inserted inside the uterine cavity under the continuous infusion of normal saline solution.   Findings are as noted above.  The Myosure Reach device was used to resect the endometrial polyps without difficulty. The polyps were sent to Pathology. Endocervical curettage was performed with the Kevorkian curette.  The specimen was sent to Pathology. The cervix was further dilated to a #23 Pratt dilator.   The serrated and then sharp curettes were introduced into the uterine cavity and the endometrium was curetted in all 4 quadrants.   A minimal amount of endometrial curettings was obtained.  This specimen was sent to Pathology.  The single-tooth tenaculum which had been placed on the anterior cervical lip was removed.     Hemostasis was good, and all of the vaginal instruments were removed.  The patient was awakened and escorted to the recovery room in stable condition after she was cleansed of Betadine.  There were no  complications to the procedure.  All needle, instrument and sponge counts were correct.  Lenard Galloway, MD

## 2021-05-13 NOTE — Progress Notes (Signed)
Update to History and Physical  No marked change in status since office pre-op visit.   Patient examined.   OK to proceed with surgery. 

## 2021-05-13 NOTE — Transfer of Care (Signed)
Immediate Anesthesia Transfer of Care Note  Patient: Brandi Carpenter  Procedure(s) Performed: DILATATION & CURETTAGE/HYSTEROSCOPY WITH MYOSURE RESECTION OF ENDOMETRIAL POLYP, ENDOCERVICAL CURETTAGE (Vagina )  Patient Location: PACU  Anesthesia Type:General  Level of Consciousness: awake, alert , oriented and patient cooperative  Airway & Oxygen Therapy: Patient Spontanous Breathing  Post-op Assessment: Report given to RN and Post -op Vital signs reviewed and stable  Post vital signs: Reviewed and stable  Last Vitals:  Vitals Value Taken Time  BP 127/88 05/13/21 0754  Temp    Pulse 97 05/13/21 0757  Resp 17 05/13/21 0757  SpO2 97 % 05/13/21 0757  Vitals shown include unvalidated device data.  Last Pain:  Vitals:   05/13/21 0610  TempSrc: Oral  PainSc: 0-No pain      Patients Stated Pain Goal: 5 ([id number])  Complications: No notable events documented.

## 2021-05-13 NOTE — Anesthesia Postprocedure Evaluation (Signed)
Anesthesia Post Note  Patient: Brandi Carpenter  Procedure(s) Performed: DILATATION & CURETTAGE/HYSTEROSCOPY WITH MYOSURE RESECTION OF ENDOMETRIAL POLYP, ENDOCERVICAL CURETTAGE (Vagina )     Patient location during evaluation: PACU Anesthesia Type: General Level of consciousness: awake and alert Pain management: pain level controlled Vital Signs Assessment: post-procedure vital signs reviewed and stable Respiratory status: spontaneous breathing, nonlabored ventilation and respiratory function stable Cardiovascular status: blood pressure returned to baseline and stable Postop Assessment: no apparent nausea or vomiting Anesthetic complications: no   No notable events documented.  Last Vitals:  Vitals:   05/13/21 0815 05/13/21 0850  BP: 119/76 138/62  Pulse: 90 82  Resp: (!) 29 16  Temp:  (!) 36.4 C  SpO2: 99% 99%    Last Pain:  Vitals:   05/13/21 0845  TempSrc:   PainSc: 0-No pain                 [name]

## 2021-05-14 ENCOUNTER — Encounter (HOSPITAL_BASED_OUTPATIENT_CLINIC_OR_DEPARTMENT_OTHER): Payer: Self-pay | Admitting: Obstetrics and Gynecology

## 2021-05-22 LAB — SURGICAL PATHOLOGY

## 2021-05-29 ENCOUNTER — Other Ambulatory Visit: Payer: Self-pay

## 2021-05-29 ENCOUNTER — Encounter: Payer: Self-pay | Admitting: Obstetrics and Gynecology

## 2021-05-29 ENCOUNTER — Ambulatory Visit (INDEPENDENT_AMBULATORY_CARE_PROVIDER_SITE_OTHER): Payer: BC Managed Care – PPO | Admitting: Obstetrics and Gynecology

## 2021-05-29 VITALS — BP 114/78 | HR 71 | Resp 14 | Ht 64.0 in | Wt 180.0 lb

## 2021-05-29 DIAGNOSIS — Z9889 Other specified postprocedural states: Secondary | ICD-10-CM

## 2021-05-29 NOTE — Progress Notes (Signed)
GYNECOLOGY  VISIT   HPI: 55 y.o.   Domestic Partner  Caucasian  female   Epes with Patient's last menstrual period was 01/06/2016.   here for  post op check for [place] RESECTION OF ENDOMETRIAL POLYP, ENDOCERVICAL CURETTAGE  Final pathology:  benign endometrial polyp, benign endometrial curetting, benign endocervical curetting.   No pain or bleeding.   Did well after surgery.   On HRT and wants to continue.   GYNECOLOGIC HISTORY: Patient's last menstrual period was 01/06/2016. Contraception:  Post menopausal  Menopausal hormone therapy:  none  Last mammogram:  02-05-21 density D/BiRADS 1 negative  Last pap smear:   02-05-21 Atypical glandular, 04-17-21 colposcopy benign         OB History     Gravida  0   Para  0   Term  0   Preterm  0   AB  0   Living  0      SAB  0   IAB  0   Ectopic  0   Multiple  0   Live Births                 Patient Active Problem List   Diagnosis Date Noted   Atypical glandular cells of undetermined significance (AGUS) on cervical Pap smear 01/08/2019   Endometrial polyp 01/08/2019   Postmenopausal bleeding 12/26/2018   Lateral epicondylitis of right elbow 07/12/2018   Lumbar radiculopathy, right 07/12/2018   Piriformis syndrome of right side 06/08/2018   Status post cervical spinal fusion 01/27/2016   Headache 05/13/2015   Blurry vision 05/13/2015   Perceived hearing changes 05/13/2015   Migraine headache 04/29/2015   Raynaud phenomenon 08/28/2013   Depression, major, in remission ([place]) 08/28/2013   Allergic conjunctivitis 05/09/2013   Anxiety 03/09/2012   Insomnia 03/09/2012   Allergic rhinitis 03/09/2012    Past Medical History:  Diagnosis Date   Abnormal Pap smear    ASCUS 08/1999; CIN 11/1999, s/p conization, 12-26-2018 AGUS   Allergic rhinitis, cause unspecified    Anxiety    Complication of anesthesia    "difficulty awakening" remote past   DDD (degenerative disc  disease), cervical 05/05/2018   Noted on imaging   DDD (degenerative disc disease), lumbar 06/08/2018   Noted on imaging   Depression    Endometrial polyp    Family history of adverse reaction to anesthesia    mother slow to awaken   GERD (gastroesophageal reflux disease)    Headache    Insomnia    PMB (postmenopausal bleeding) 05/08/2021   Post-nasal drip 05/08/2021   uses  mucinex daily for   Spondylolysis, lumbar region 07/13/2003   Noted on imaging   Stenosis, cervical spine 05/05/2018   Noted on imaging   Vitamin D deficiency     Past Surgical History:  Procedure Laterality Date   ANKLE SURGERY  child   left for torn ligaments, left   ANTERIOR CERVICAL DECOMP/DISCECTOMY FUSION N/A 01/27/2016   Procedure: C5-6, C6-7 Anterior Cervical Discectomy and Fusion, Allograft, Plate;  Surgeon: Marybelle Killings, MD;  Location: [place];  Service: Orthopedics;  Laterality: N/A;   CERVICAL CONIZATION W/BX  2001   Laser conization - Dr. Rhodia Albright - [facility] - Moderate to severe dysplasia, negative margins   COLONOSCOPY  09/2015   DILATATION & CURETTAGE/HYSTEROSCOPY WITH MYOSURE N/A 01/23/2019   Procedure: [place];  Surgeon: Nunzio Cobbs, MD;  Location: [facility];  Service: Gynecology;  Laterality: N/A;   DILATATION & CURETTAGE/HYSTEROSCOPY WITH MYOSURE N/A 05/13/2021   Procedure: DILATATION & CURETTAGE/HYSTEROSCOPY WITH MYOSURE RESECTION OF ENDOMETRIAL POLYP, ENDOCERVICAL CURETTAGE;  Surgeon: Nunzio Cobbs, MD;  Location: [facility];  Service: Gynecology;  Laterality: N/A;   LUMBAR DISC SURGERY  10/2018   LUMBAR MICRODISCECTOMY Right 11/09/2018   lumbar level:L5-S1 Utilizing microscope-Dr.Gary Cram   NASAL SEPTOPLASTY W/ TURBINOPLASTY Bilateral 10/07/2015   Procedure: NASAL SEPTOPLASTY WITH BILATERAL TURBINATE REDUCTION;  Surgeon: Leta Baptist, MD;  Location: [place];  Service: ENT;   Laterality: Bilateral;   PILONIDAL CYST EXCISION  1988   WISDOM TOOTH EXTRACTION     yrs ago    Current Outpatient Medications  Medication Sig Dispense Refill   ALPRAZolam (XANAX) 0.5 MG tablet TAKE 1/2 TO 1 TABLET BY MOUTH THREE TIMES DAILY AS NEEDED FOR ANXIETY 20 tablet 0   azelastine (ASTELIN) 0.1 % nasal spray 2 (two) times daily.      buPROPion (WELLBUTRIN XL) 300 MG 24 hr tablet TAKE 1 TABLET BY MOUTH EVERY MORNING 90 tablet 3   cetirizine (ZYRTEC) 10 MG tablet Take 10 mg by mouth daily.     Cholecalciferol (VITAMIN D) 2000 UNITS tablet Take 2,000 Units by mouth daily.     citalopram (CELEXA) 20 MG tablet TAKE 1 TABLET BY MOUTH ONCE DAILY (Patient taking differently: every evening. take 1 tablet by mouth once daily) 90 tablet 3   EPINEPHrine 0.3 mg/0.3 mL IJ SOAJ injection Inject into the muscle as directed.     estradiol (DOTTI) 0.0375 MG/24HR Place 1 patch onto the skin 2 (two) times a week. 24 patch 3   famotidine (PEPCID) 20 MG tablet Take 20 mg by mouth as needed.     fluticasone (FLONASE) 50 MCG/ACT nasal spray INSTILL 2 SPRAYS INTO EACH NOSTRIL twice a day  0   gabapentin (NEURONTIN) 100 MG capsule TAKE 2 CAPSULES(200 MG) BY MOUTH AT BEDTIME 180 capsule 1   guaiFENesin (MUCINEX) 600 MG 12 hr tablet Take 1,200 mg by mouth 2 (two) times daily.     ibuprofen (ADVIL) 800 MG tablet Take 1 tablet (800 mg total) by mouth every 8 (eight) hours as needed. 30 tablet 0   levocetirizine (XYZAL) 5 MG tablet TAKE 1 TABLET BY MOUTH EVERY EVENING 90 tablet 3   montelukast (SINGULAIR) 10 MG tablet SMARTSIG:1 Tablet(s) By Mouth Every Evening     Olopatadine HCl 0.2 % SOLN daily as needed.     progesterone (PROMETRIUM) 100 MG capsule TAKE 1 CAPSULE(100 MG) BY MOUTH DAILY (Patient taking differently: at bedtime. TAKE 1 CAPSULE(100 MG) BY MOUTH DAILY) 90 capsule 3   pseudoephedrine (SUDAFED) 30 MG tablet Take 30 mg by mouth every 4 (four) hours as needed for congestion.     UNABLE TO FIND Allergy  injection q week at dr [facility] offfice     zolpidem (AMBIEN) 10 MG tablet TAKE 1/2 TO 1 TABLET BY MOUTH EVERY DAY AT BEDTIME AS NEEDED FOR SLEEP 30 tablet 2   No current facility-administered medications for this visit.     ALLERGIES: Codeine and Pamelor [nortriptyline]  Family History  Problem Relation Age of Onset   Hypertension Mother    Diabetes Mother    GER disease Mother    Heart disease Father    Depression Sister    Hypertension Sister    Breast cancer Cousin    Colon cancer Maternal Grandmother  died at 18   Diabetes Maternal Grandmother    Alzheimer's disease Paternal Grandmother    Colon polyps Maternal Uncle    GER disease Maternal Uncle    Migraines Neg Hx     Social History   Socioeconomic History   Marital status: Soil scientist    Spouse name: Not on file   Number of children: 0   Years of education: 16   Highest education level: Not on file  Occupational History   Occupation: Naval architect: [employer]  Tobacco Use   Smoking status: Never   Smokeless tobacco: Never  Vaping Use   Vaping Use: Never used  Substance and Sexual Activity   Alcohol use: No    Alcohol/week: 0.0 standard drinks   Drug use: Never   Sexual activity: Not Currently    Partners: Female    Birth control/protection: Abstinence, Post-menopausal  Other Topics Concern   Not on file  Social History Narrative   Lives with female partner Juliann Pulse), 2 beagles   Social Determinants of Health   Financial Resource Strain: Not on file  Food Insecurity: Not on file  Transportation Needs: Not on file  Physical Activity: Not on file  Stress: Not on file  Social Connections: Not on file  Intimate Partner Violence: Not on file    Review of Systems  All other systems reviewed and are negative.  PHYSICAL EXAMINATION:    BP 114/78 (BP Location: Right Arm, Patient Position: Sitting, Cuff Size: Normal)   Pulse 71   Resp 14   Ht '5\' 4"'$  (1.626 m)   Wt 180  lb (81.6 kg)   LMP 01/06/2016   BMI 30.90 kg/m     General appearance: alert, cooperative and appears stated age   ASSESSMENT  Status post hysteroscopic polypectomy with Myosure, dilation and curettage.  HRT.  Hx AGUS pap.   PLAN  Surgical findings, procedure and pathology report reviewed with patient.  Call for any future bleeding.  Annual exam and pap in May, 2023.    An After Visit Summary was printed and given to the patient.

## 2021-06-11 ENCOUNTER — Other Ambulatory Visit: Payer: Self-pay | Admitting: Family Medicine

## 2021-06-11 DIAGNOSIS — F325 Major depressive disorder, single episode, in full remission: Secondary | ICD-10-CM

## 2021-06-26 ENCOUNTER — Other Ambulatory Visit: Payer: Self-pay | Admitting: Family Medicine

## 2021-06-26 DIAGNOSIS — F411 Generalized anxiety disorder: Secondary | ICD-10-CM

## 2021-07-14 ENCOUNTER — Other Ambulatory Visit: Payer: Self-pay | Admitting: Family Medicine

## 2021-07-14 DIAGNOSIS — J309 Allergic rhinitis, unspecified: Secondary | ICD-10-CM

## 2021-07-15 MED ORDER — OLOPATADINE HCL 0.2 % OP SOLN: 1.0000 [drp] | Freq: Every day | OPHTHALMIC | 1 refills | Status: DC

## 2021-07-15 NOTE — Addendum Note (Signed)
Addended by: Rita Ohara on: 07/15/2021 06:53 PM   Modules accepted: Orders

## 2021-07-15 NOTE — Telephone Encounter (Signed)
Ok to refill 

## 2021-07-15 NOTE — Addendum Note (Signed)
Addended by: Minette Headland A on: 07/15/2021 12:41 PM   Modules accepted: Orders

## 2021-07-25 ENCOUNTER — Telehealth: Payer: BC Managed Care – PPO | Admitting: Medical

## 2021-07-25 VITALS — Wt 180.0 lb

## 2021-07-25 DIAGNOSIS — J0100 Acute maxillary sinusitis, unspecified: Secondary | ICD-10-CM | POA: Diagnosis not present

## 2021-07-25 MED ORDER — PREDNISONE 10 MG PO TABS: ORAL_TABLET | ORAL | 0 refills | Status: DC

## 2021-07-25 MED ORDER — CLARITHROMYCIN 500 MG PO TABS: 500.0000 mg | ORAL_TABLET | Freq: Two times a day (BID) | ORAL | 0 refills | Status: DC

## 2021-07-25 NOTE — Progress Notes (Signed)
Subjective:     Patient ID: Brandi Carpenter, female   DOB: 07-Mar-1966, 55 y.o.   MRN: [id number]  This visit type was conducted due to national recommendations for restrictions regarding the COVID-19 Pandemic (e.g. social distancing) in an effort to limit this patient's exposure and mitigate transmission in our community.  Due to their co-morbid illnesses, this patient is at least at moderate risk for complications without adequate follow up.  This format is felt to be most appropriate for this patient at this time.    Documentation for virtual audio and video telecommunications through [place] encounter:  The patient was located at home. The provider was located in the office. The patient did consent to this visit and is aware of possible charges through their insurance for this visit.  The other persons participating in this telemedicine service were none. Time spent on call was 20 minutes and in review of previous records 20 minutes total.  This virtual service is not related to other E/M service within previous 7 days.   HPI Chief Complaint  Patient presents with   Facial Pain    Facial pain, teeth pain x couple days. Sinus pressure and headaches   She notes history chronic allergies, on allergy shots and allergy medication.  She is on her routine medications.  However this week she has had facial pain, headache, sinus pressure, upper teeth ache.   Hasn't had this type of symptoms in several years.  In the past thought she gets sinus infections that feel like this.  In the past antibiotic alone doesn't seem to help.  Usually has to use prednisone + abx.  No fever, no cough, no sore throat.   No ear pain.  Feels pressure behind eyes.  No SOB or wheezing.  No nvd.  No sick contacts.    Using decongestant, mucinex, using nasal saline.  No sick contacts.  No other aggravating or relieving factors. No other complaint.   Past Medical History:  Diagnosis Date   Abnormal Pap smear     ASCUS 08/1999; CIN 11/1999, s/p conization, 12-26-2018 AGUS   Allergic rhinitis, cause unspecified    Anxiety    Complication of anesthesia    "difficulty awakening" remote past   DDD (degenerative disc disease), cervical 05/05/2018   Noted on imaging   DDD (degenerative disc disease), lumbar 06/08/2018   Noted on imaging   Depression    Endometrial polyp    Family history of adverse reaction to anesthesia    mother slow to awaken   GERD (gastroesophageal reflux disease)    Headache    Insomnia    PMB (postmenopausal bleeding) 05/08/2021   Post-nasal drip 05/08/2021   uses  mucinex daily for   Spondylolysis, 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      buPROPion (WELLBUTRIN XL) 300 MG 24 hr tablet TAKE 1 TABLET BY MOUTH EVERY MORNING 90 tablet 3   cetirizine (ZYRTEC) 10 MG tablet Take 10 mg by mouth daily.     Cholecalciferol (VITAMIN D) 2000 UNITS tablet Take 2,000 Units by mouth daily.  citalopram (CELEXA) 20 MG tablet TAKE 1 TABLET BY MOUTH ONCE DAILY (Patient taking differently: every evening. take 1 tablet by mouth once daily) 90 tablet 3   estradiol (DOTTI) 0.0375 MG/24HR Place 1 patch onto the skin 2 (two) times a week. 24 patch 3   famotidine (PEPCID) 20 MG tablet Take 20 mg by mouth as needed.     fluticasone (FLONASE) 50 MCG/ACT nasal spray INSTILL 2 SPRAYS INTO EACH NOSTRIL twice a day  0   gabapentin (NEURONTIN) 100 MG capsule TAKE 2 CAPSULES(200 MG) BY MOUTH AT BEDTIME 180 capsule 1   guaiFENesin (MUCINEX) 600 MG 12 hr tablet Take 1,200 mg by mouth 2 (two) times daily.     ibuprofen (ADVIL) 800 MG tablet Take 1 tablet  (800 mg total) by mouth every 8 (eight) hours as needed. 30 tablet 0   levocetirizine (XYZAL) 5 MG tablet TAKE 1 TABLET BY MOUTH EVERY EVENING 90 tablet 3   montelukast (SINGULAIR) 10 MG tablet SMARTSIG:1 Tablet(s) By Mouth Every Evening     Olopatadine HCl 0.2 % SOLN Place 1 drop into both eyes daily. 2.5 mL 1   progesterone (PROMETRIUM) 100 MG capsule TAKE 1 CAPSULE(100 MG) BY MOUTH DAILY (Patient taking differently: at bedtime. TAKE 1 CAPSULE(100 MG) BY MOUTH DAILY) 90 capsule 3   pseudoephedrine (SUDAFED) 30 MG tablet Take 30 mg by mouth every 4 (four) hours as needed for congestion.     UNABLE TO FIND Allergy injection q week at dr [facility]edge offfice     zolpidem (AMBIEN) 10 MG tablet TAKE 1/2 TO 1 TABLET BY MOUTH EVERY DAY AT BEDTIME AS NEEDED FOR SLEEP 30 tablet 2   EPINEPHrine 0.3 mg/0.3 mL IJ SOAJ injection Inject into the muscle as directed.     No current facility-administered medications on file prior to visit.    Review of Systems As in subjective    Objective:   Physical Exam Due to coronavirus pandemic stay at home measures, patient visit was virtual and they were not examined in person.   Wt 180 lb (81.6 kg)   LMP 01/06/2016   BMI 30.90 kg/m   Gen: wd, wn, nad, mildly ill appearing No labored breathing or witnessed wheezing      Assessment:     Encounter Diagnosis  Name Primary?   Acute non-recurrent maxillary sinusitis Yes       Plan:     Discussed symptoms, concerns, continue good hydration, nasal saline, decongestant short term.   Begin regimen below.  If not much better in 4-5 days or not resolving within a week or so, then recheck.  If new or worse symptoms in the short term then get re-evaluated.    Brandi Carpenter was seen today for facial pain.  Diagnoses and all orders for this visit:  Acute non-recurrent maxillary sinusitis  Other orders -     clarithromycin (BIAXIN) 500 MG tablet; Take 1 tablet (500 mg total) by mouth 2 (two) times daily. -      predniSONE (DELTASONE) 10 MG tablet; 6 tablets all together day 1, 5 tablets day 2, 4 tablets day 3, 3 tablets day 4, 2 tablets day 5, 1 tablet day 6.   F/u prn

## 2021-09-01 ENCOUNTER — Other Ambulatory Visit: Payer: Self-pay | Admitting: Family Medicine

## 2021-09-01 DIAGNOSIS — G2581 Restless legs syndrome: Secondary | ICD-10-CM

## 2021-09-02 NOTE — Telephone Encounter (Signed)
[facility] is requesting to fill pt gabapentin. Please advise K[facility] 

## 2021-09-05 ENCOUNTER — Other Ambulatory Visit: Payer: Self-pay | Admitting: Family Medicine

## 2021-09-05 DIAGNOSIS — F325 Major depressive disorder, single episode, in full remission: Secondary | ICD-10-CM

## 2021-09-12 ENCOUNTER — Other Ambulatory Visit: Payer: Self-pay | Admitting: Family Medicine

## 2021-09-12 DIAGNOSIS — F325 Major depressive disorder, single episode, in full remission: Secondary | ICD-10-CM

## 2021-09-12 NOTE — Telephone Encounter (Signed)
Patient has an appt in January

## 2021-09-17 ENCOUNTER — Other Ambulatory Visit: Payer: Self-pay | Admitting: Family Medicine

## 2021-09-17 DIAGNOSIS — G4700 Insomnia, unspecified: Secondary | ICD-10-CM

## 2021-09-17 NOTE — Telephone Encounter (Signed)
Is this okay to refill? 

## 2021-09-23 NOTE — Progress Notes (Signed)
Chief Complaint  Patient presents with   Annual Exam    Fasting annual exam. Did not give urine. Still having sleeping issues, off and on. Still continues to feel fatigued and having weight issues. After she has been working in the yard for a long time, pushes herself and she starts to feel shaky-wonders if she has a component of diabetes-feels like she needs to eat something and then she feels better. Also wonders if her anxiety meds need changed.     Brandi Carpenter is a 56 y.o. female who presents for a complete physical.   She is under the care of Dr. Quincy Simmonds. She had PMB (on HRT) in August, and AGUS on pap.  Colpo was benign.  She underwent hysteroscopic polypectomy with Myosure, dilation and curettage on 8/30.  She has done well since, with no further PMB.  She continues on HRT. She is due for annual exam and pap with GYN in May.   Depression/Anxiety:  She feels "blah", always feels worse in the winter, but this is worse than normal. Sister was hospitalized recently, seeing therapist, on different medications, and is a "whole different person", back to herself.  She is interested in changing meds to see if her moods and motivation could be better. Recalls being on other SSRI's in the distant past, made her feel "flat". She feels fidgety and impatient in general, not just at night. Stressors include family, "we are worriers" Denies any known side effects from wellbutrin or citalopram (other than some chronic insomnia).  Last refilled alprazolam #20 in 06/2021.  Uses it mainly for dentalwork.  Insomnia, requiring the use of 1/2 tablet of ambien about 4-5 nights/week (was too groggy in the mornings when tried [place] CR in the past). Recently refilled.  Her sleep issues comes and goes.  Sometimes even the full tablet doesn't "knock her out". Has problems falling asleep and staying asleep.   She limits her caffeine (nothing after 2pm), no alcohol.  Restless legs--recurred after gabapentin  stopped after her back surgery in 2020.  RLS resolved since restarting gabapentin.   GERD:  She has been taking famotidine once daily (rarely also in the evening, only prn), and that has been controlling her symptoms.  Denies heartburn, hiccups, dysphagia.  Chronic back pain, s/p microdisckectomy 10/2019 by Dr. Saintclair Halsted. Last MRI 03/2020. Prolonged standing, and certain positions aggravate her back pain.  Not needing pain meds. Back has been pretty good, sometimes flares with yardwork (repetitive bending, to pick up sticks).  Stretches help, sometimes will take advil or aleve,  It could take a week, then back to baseline. Denies numbness, tingling or weakness.   Allergies: Under the care of allergist.  She is back on immunotherapy.  She had a sinus infection treated in November with Biaxin and prednisone (saw Audelia Acton). Allergies are well controlled on her current regimen.    Immunization History  Administered Date(s) Administered   Influenza Split 07/14/2014, 04/25/2016   Influenza, High Dose Seasonal PF 07/18/2021   Influenza,inj,Quad PF,6+ Mos 06/28/2013, 08/01/2015, 06/16/2019, 05/22/2020   Influenza-Unspecified 07/06/2017, 06/28/2018   Janssen (J&J) SARS-COV-2 Vaccination 11/23/2019   Moderna Sars-Covid-2 Vaccination 08/09/2020   Tdap 05/11/2012   Zoster Recombinat (Shingrix) 08/25/2018, 10/26/2018   Zoster, Live 04/25/2016   Last Pap smear: 01/2021 AGUS, benign colpo. Last mammogram: 01/2021 Last colonoscopy: 09/2015 with Dr. Ardis Hughs, normal.  Last DEXA: never   Dentist: twice a year Ophtho:  went recently, got glasses for distance (prev just used readers).   Exercise: "  not much".  Walks a few times/week.  Lack of motivation. No current weight-bearing exercise  Lipids: Lab Results  Component Value Date   CHOL 206 (H) 08/30/2019   HDL 64 08/30/2019   LDLCALC 122 (H) 08/30/2019   TRIG 111 08/30/2019   CHOLHDL 3.2 08/30/2019   Vitamin D-OH 67.5 in 04/2018. Pt states vitamins are the  same.   PMH, PSH, SH and FH reviewed and updated  Outpatient Encounter Medications as of 09/24/2021  Medication Sig Note   buPROPion (WELLBUTRIN XL) 300 MG 24 hr tablet TAKE 1 TABLET BY MOUTH EVERY MORNING    cetirizine (ZYRTEC) 10 MG tablet Take 10 mg by mouth daily.    Cholecalciferol (VITAMIN D) 2000 UNITS tablet Take 2,000 Units by mouth daily.    citalopram (CELEXA) 20 MG tablet TAKE 1 TABLET BY MOUTH EVERY DAY    estradiol (DOTTI) 0.0375 MG/24HR Place 1 patch onto the skin 2 (two) times a week. 05/13/2021: Has on currently   famotidine (PEPCID) 20 MG tablet Take 20 mg by mouth as needed. 09/24/2021: Taking more daily, lately   fluticasone (FLONASE) 50 MCG/ACT nasal spray INSTILL 2 SPRAYS INTO EACH NOSTRIL twice a day    gabapentin (NEURONTIN) 100 MG capsule TAKE 2 CAPSULES(200 MG) BY MOUTH AT BEDTIME    guaiFENesin (MUCINEX) 600 MG 12 hr tablet Take 1,200 mg by mouth 2 (two) times daily.    ibuprofen (ADVIL) 200 MG tablet Take 800 mg by mouth every 6 (six) hours as needed. 09/24/2021: Took yesterday   levocetirizine (XYZAL) 5 MG tablet TAKE 1 TABLET BY MOUTH EVERY EVENING    montelukast (SINGULAIR) 10 MG tablet SMARTSIG:1 Tablet(s) By Mouth Every Evening    Olopatadine HCl 0.2 % SOLN Place 1 drop into both eyes daily.    progesterone (PROMETRIUM) 100 MG capsule TAKE 1 CAPSULE(100 MG) BY MOUTH DAILY (Patient taking differently: at bedtime. TAKE 1 CAPSULE(100 MG) BY MOUTH DAILY)    UNABLE TO FIND Allergy injection q week at dr [facility] offfice    ALPRAZolam (XANAX) 0.5 MG tablet TAKE 1/2 TO 1 TABLET BY MOUTH THREE TIMES DAILY AS NEEDED FOR ANXIETY (Patient not taking: Reported on 09/24/2021) 09/24/2021: Uses very infrequently   azelastine (ASTELIN) 0.1 % nasal spray 2 (two) times daily.  (Patient not taking: Reported on 09/24/2021) 09/24/2021: Uses as needed   EPINEPHrine 0.3 mg/0.3 mL IJ SOAJ injection Inject into the muscle as directed. (Patient not taking: Reported on 09/24/2021)     pseudoephedrine (SUDAFED) 30 MG tablet Take 30 mg by mouth every 4 (four) hours as needed for congestion. (Patient not taking: Reported on 09/24/2021) 09/24/2021: As needed   zolpidem (AMBIEN) 10 MG tablet TAKE 1/2 TO 1 TABLET BY MOUTH EVERY DAY AT BEDTIME AS NEEDED FOR SLEEP (Patient not taking: Reported on 09/24/2021) 09/24/2021: As needed   [DISCONTINUED] clarithromycin (BIAXIN) 500 MG tablet Take 1 tablet (500 mg total) by mouth 2 (two) times daily.    [DISCONTINUED] ibuprofen (ADVIL) 800 MG tablet Take 1 tablet (800 mg total) by mouth every 8 (eight) hours as needed. (Patient not taking: Reported on 09/24/2021) 09/24/2021: Took yesterday   [DISCONTINUED] predniSONE (DELTASONE) 10 MG tablet 6 tablets all together day 1, 5 tablets day 2, 4 tablets day 3, 3 tablets day 4, 2 tablets day 5, 1 tablet day 6.    No facility-administered encounter medications on file as of 09/24/2021.   Allergies  Allergen Reactions   Codeine Itching, Nausea And Vomiting and Other (See Comments)  Pamelor [Nortriptyline] Tinitus    ROS: The patient denies anorexia, fever, vision changes, decreased hearing, ear pain, sore throat, breast concerns, chest pain, palpitations, dizziness, syncope, dyspnea on exertion, cough, swelling, nausea, vomiting, diarrhea, constipation, abdominal pain, melena, hematochezia, hematuria, incontinence, dysuria, vaginal bleeding, discharge, odor or itch, genital lesions, weakness, tremor, suspicious skin lesions, abnormal bleeding/bruising, or enlarged lymph nodes.   No further headaches, occ sinus. Insomnia, per HPI Depression/anxiety per HPI Hot flashes resolved with HRT No further vaginal bleeding Back pain per HPI, rare/sporadic RLS resolved Heartburn resolved with pepcid Denies hair/skin/bowel changes   PHYSICAL EXAM:  BP 124/76    Pulse 80    Ht 5' 4"  (1.626 m)    Wt 186 lb 3.2 oz (84.5 kg)    LMP 01/06/2016    BMI 31.96 kg/m   Wt Readings from Last 3 Encounters:  09/24/21 186  lb 3.2 oz (84.5 kg)  07/25/21 180 lb (81.6 kg)  05/29/21 180 lb (81.6 kg)    General Appearance:    Alert, cooperative, no distress, appears stated age. She did not change into gown for exam today. Some throat-clearing  Head:    Normocephalic, without obvious abnormality, atraumatic     Eyes:    PERRL, conjunctiva/corneas clear, EOM's intact, fundi benign     Ears:    Normal TM's and external ear canals..  Nose:    Not examined, wearing mask due to COVID-19 pandemic    Throat:    Not examined, wearing mask due to COVID-19 pandemic    Neck:    Supple, no lymphadenopathy; thyroid: no enlargement/ tenderness/nodules; no carotid bruit or JVD     Back:    Spine nontender, no curvature, ROM normal, no CVA tenderness    Lungs:    Clear to auscultation bilaterally without wheezes, rales or ronchi; respirations unlabored     Chest Wall:    No tenderness or deformity     Heart:    Regular rate and rhythm, S1 and S2 normal, no murmur, rub or gallop     Breast Exam:    Deferred to GYN   Abdomen:    Soft, non-tender, nondistended, normoactive bowel sounds, no masses, no hepatosplenomegaly     Genitalia:    Deferred to GYN   Rectal:    Deferred to GYN   Extremities:    No clubbing, cyanosis or edema.  Pulses:    2+ and symmetric all extremities     Skin:    Skin color, texture, turgor normal, no rashes. (limited exam, not changed into gown)  Lymph nodes:   Cervical, supraclavicular nodes normal    Neurologic:    Normal strength, sensation and gait; reflexes 2+ and symmetric throughout                   Psych:   Depressed mood, full range of affect, normal hygiene and grooming   Depression screen [facility] 2/9 09/24/2021 09/11/2020 05/22/2020 08/30/2019 08/30/2019  Decreased Interest 0 0 0 0 0  Down, Depressed, Hopeless 0 0 0 0 0  PHQ - 2 Score 0 0 0 0 0  Altered sleeping 2 - - 2 -  Tired, decreased energy 2 - - 0 -  Change in appetite 2 - - 0 -  Feeling bad or failure about yourself  3 - - 0 -  Trouble  concentrating 0 - - 0 -  Moving slowly or fidgety/restless 2 - - 0 -  Suicidal thoughts 0 - - 0 -  PHQ-9 Score 11 - - 2 -  Difficult doing work/chores Somewhat difficult - - - -  Some recent data might be hidden   GAD 7 : Generalized Anxiety Score 09/24/2021  Nervous, Anxious, on Edge 2  Control/stop worrying 1  Worry too much - different things 1  Trouble relaxing 1  Restless 2  Easily annoyed or irritable 3  Afraid - awful might happen 0  Total GAD 7 Score 10  Anxiety Difficulty Not difficult at all    ASSESSMENT/PLAN:  Annual physical exam - Plan: Lipid panel, Comprehensive metabolic panel, CBC with Differential/Platelet, TSH  Current moderate episode of major depressive disorder, unspecified whether recurrent ([place]) - doesn't think she will make the calls herself to find psych; refer to [facility] to help coordinate  Insomnia, unspecified type - discussed sleep hygiene, exercise, briefly spoke of meds. cont ambien prn. Refer to psych  Restless leg syndrome - well controlled with gabapentin  Seasonal allergic rhinitis, unspecified trigger - on immunotherapy, sees allergist.  Frequent throat-clearing during visit, she states mornings only  Need for Tdap vaccination - Plan: Tdap vaccine greater than or equal to 7yo IM  Fatigue, unspecified type - Ddx reviewed, contrib by depression/anxiety, insomnia. Also snores--will check with partner re: apnea. Check labs. - Plan: Comprehensive metabolic panel, CBC with Differential/Platelet, TSH  Weight gain - counseled re: healthy diet, exercise, weight loss. Suspect related to depression, lack of motivation for exercise - Plan: TSH  Need for COVID-19 vaccine - Plan: Moderna Covid-19 Vaccine Bivalent Booster  To check with partner regarding any sleep apnea, and to let us kow TSH, c-met, lipid.[place] referral for therapist and psych (MD); okay to text. [place] for in-person or virtual. Can try increasing citalopram to 30-40  mg while waiting.  Meds RF'd in December, not needed until March.  Discussed monthly self breast exams and yearly mammograms; at least 30 minutes of aerobic activity at least 5 days/week, weight-bearing exercise at least 2x/wk; proper sunscreen use reviewed; healthy diet, including goals of calcium and vitamin D intake and alcohol recommendations (less than or equal to 1 drink/day) reviewed; regular seatbelt use; changing batteries in smoke detectors.  Immunization recommendations discussed--continue yearly flu shots.  TdaP and bivalent COVID booster given today. Colonoscopy recommendations reviewed--UTD   F/u 1 year CPE

## 2021-09-23 NOTE — Patient Instructions (Addendum)
°  HEALTH MAINTENANCE RECOMMENDATIONS:  It is recommended that you get at least 30 minutes of aerobic exercise at least 5 days/week (for weight loss, you may need as much as 60-90 minutes). This can be any activity that gets your heart rate up. This can be divided in 10-15 minute intervals if needed, but try and build up your endurance at least once a week.  Weight bearing exercise is also recommended twice weekly.  Eat a healthy diet with lots of vegetables, fruits and fiber.  "Colorful" foods have a lot of vitamins (ie green vegetables, tomatoes, red peppers, etc).  Limit sweet tea, regular sodas and alcoholic beverages, all of which has a lot of calories and sugar.  Up to 1 alcoholic drink daily may be beneficial for women (unless trying to lose weight, watch sugars).  Drink a lot of water.  Calcium recommendations are 1200-1500 mg daily (1500 mg for postmenopausal women or women without ovaries), and vitamin D 1000 IU daily.  This should be obtained from diet and/or supplements (vitamins), and calcium should not be taken all at once, but in divided doses.  Monthly self breast exams and yearly mammograms for women over the age of 60 is recommended.  Sunscreen of at least SPF 30 should be used on all sun-exposed parts of the skin when outside between the hours of 10 am and 4 pm (not just when at beach or pool, but even with exercise, golf, tennis, and yard work!)  Use a sunscreen that says "broad spectrum" so it covers both UVA and UVB rays, and make sure to reapply every 1-2 hours.  Remember to change the batteries in your smoke detectors when changing your clock times in the spring and fall. Carbon monoxide detectors are recommended for your home.  Use your seat belt every time you are in a car, and please drive safely and not be distracted with cell phones and texting while driving.  We are going to put in a referral to [facility], which should help get you hooked up with a therapist and  psychiatrist that takes your insurance. If you aren't happy with who that is, I highly suggest looking at [facility] (on [facility]).  Ask your partner if you ever have long pauses in your breathing while you are sleeping/snoring.  If that is occurring frequently, we should do a sleep study to evaluate for sleep apnea.    Please limit your sugar in your diet.  Eat more protein, and vegetables.  Limit carbs. Don't skip meals. Drink plenty of water.  If you feel bad eat something, not necessarily sugary.

## 2021-09-24 ENCOUNTER — Ambulatory Visit: Payer: BC Managed Care – PPO | Admitting: Family Medicine

## 2021-09-24 ENCOUNTER — Other Ambulatory Visit: Payer: Self-pay

## 2021-09-24 ENCOUNTER — Encounter: Payer: Self-pay | Admitting: Family Medicine

## 2021-09-24 VITALS — BP 124/76 | HR 80 | Ht 64.0 in | Wt 186.2 lb

## 2021-09-24 DIAGNOSIS — Z0000 Encounter for general adult medical examination without abnormal findings: Secondary | ICD-10-CM

## 2021-09-24 DIAGNOSIS — J302 Other seasonal allergic rhinitis: Secondary | ICD-10-CM

## 2021-09-24 DIAGNOSIS — G4700 Insomnia, unspecified: Secondary | ICD-10-CM | POA: Diagnosis not present

## 2021-09-24 DIAGNOSIS — R5383 Other fatigue: Secondary | ICD-10-CM

## 2021-09-24 DIAGNOSIS — F321 Major depressive disorder, single episode, moderate: Secondary | ICD-10-CM

## 2021-09-24 DIAGNOSIS — G2581 Restless legs syndrome: Secondary | ICD-10-CM

## 2021-09-24 DIAGNOSIS — F325 Major depressive disorder, single episode, in full remission: Secondary | ICD-10-CM

## 2021-09-24 DIAGNOSIS — Z23 Encounter for immunization: Secondary | ICD-10-CM

## 2021-09-24 DIAGNOSIS — R635 Abnormal weight gain: Secondary | ICD-10-CM

## 2021-09-25 LAB — CBC WITH DIFFERENTIAL/PLATELET
Basophils Absolute: 0 10*3/uL (ref 0.0–0.2)
Basos: 1 %
EOS (ABSOLUTE): 0.2 10*3/uL (ref 0.0–0.4)
Eos: 3 %
Hematocrit: 39.1 % (ref 34.0–46.6)
Hemoglobin: 13.2 g/dL (ref 11.1–15.9)
Immature Grans (Abs): 0 10*3/uL (ref 0.0–0.1)
Immature Granulocytes: 0 %
Lymphocytes Absolute: 1.9 10*3/uL (ref 0.7–3.1)
Lymphs: 33 %
MCH: 29.9 pg (ref 26.6–33.0)
MCHC: 33.8 g/dL (ref 31.5–35.7)
MCV: 89 fL (ref 79–97)
Monocytes Absolute: 0.5 10*3/uL (ref 0.1–0.9)
Monocytes: 9 %
Neutrophils Absolute: 3.2 10*3/uL (ref 1.4–7.0)
Neutrophils: 54 %
Platelets: 278 10*3/uL (ref 150–450)
RBC: 4.41 x10E6/uL (ref 3.77–5.28)
RDW: 12.3 % (ref 11.7–15.4)
WBC: 5.8 10*3/uL (ref 3.4–10.8)

## 2021-09-25 LAB — COMPREHENSIVE METABOLIC PANEL
ALT: 12 IU/L (ref 0–32)
AST: 17 IU/L (ref 0–40)
Albumin/Globulin Ratio: 2.1 (ref 1.2–2.2)
Albumin: 4.7 g/dL (ref 3.8–4.9)
Alkaline Phosphatase: 70 IU/L (ref 44–121)
BUN/Creatinine Ratio: 11 (ref 9–23)
BUN: 11 mg/dL (ref 6–24)
Bilirubin Total: 0.8 mg/dL (ref 0.0–1.2)
CO2: 26 mmol/L (ref 20–29)
Calcium: 9.6 mg/dL (ref 8.7–10.2)
Chloride: 103 mmol/L (ref 96–106)
Creatinine, Ser: 1.04 mg/dL — ABNORMAL HIGH (ref 0.57–1.00)
Globulin, Total: 2.2 g/dL (ref 1.5–4.5)
Glucose: 89 mg/dL (ref 70–99)
Potassium: 4.7 mmol/L (ref 3.5–5.2)
Sodium: 140 mmol/L (ref 134–144)
Total Protein: 6.9 g/dL (ref 6.0–8.5)
eGFR: 63 mL/min/{1.73_m2} (ref >59)

## 2021-09-25 LAB — LIPID PANEL
Chol/HDL Ratio: 3.5 ratio (ref 0.0–4.4)
Cholesterol, Total: 229 mg/dL — ABNORMAL HIGH (ref 100–199)
HDL: 66 mg/dL (ref >39)
LDL Chol Calc (NIH): 137 mg/dL — ABNORMAL HIGH (ref 0–99)
Triglycerides: 150 mg/dL — ABNORMAL HIGH (ref 0–149)
VLDL Cholesterol Cal: 26 mg/dL (ref 5–40)

## 2021-09-25 LAB — TSH: TSH: 1.15 u[IU]/mL (ref 0.450–4.500)

## 2021-10-19 ENCOUNTER — Other Ambulatory Visit: Payer: Self-pay | Admitting: Family Medicine

## 2021-10-19 DIAGNOSIS — G4700 Insomnia, unspecified: Secondary | ICD-10-CM

## 2021-10-19 NOTE — Telephone Encounter (Signed)
This usually lasts 2-3 months. Is this auto-refill? See if she truly needs (and why using more often).

## 2021-10-20 NOTE — Telephone Encounter (Signed)
Patient did not need this, she hit refill by mistake when requesting another refill. Denied.

## 2021-10-22 ENCOUNTER — Other Ambulatory Visit: Payer: Self-pay | Admitting: Family Medicine

## 2021-10-22 ENCOUNTER — Other Ambulatory Visit: Payer: Self-pay | Admitting: *Deleted

## 2021-10-22 ENCOUNTER — Encounter: Payer: Self-pay | Admitting: *Deleted

## 2021-10-22 DIAGNOSIS — F325 Major depressive disorder, single episode, in full remission: Secondary | ICD-10-CM

## 2021-11-26 ENCOUNTER — Other Ambulatory Visit: Payer: Self-pay | Admitting: Family Medicine

## 2021-11-26 DIAGNOSIS — G2581 Restless legs syndrome: Secondary | ICD-10-CM

## 2021-11-26 DIAGNOSIS — F325 Major depressive disorder, single episode, in full remission: Secondary | ICD-10-CM

## 2021-12-12 ENCOUNTER — Other Ambulatory Visit: Payer: Self-pay | Admitting: Family Medicine

## 2021-12-12 DIAGNOSIS — G4700 Insomnia, unspecified: Secondary | ICD-10-CM

## 2021-12-12 MED ORDER — ZOLPIDEM TARTRATE 10 MG PO TABS: 10.0000 mg | ORAL_TABLET | Freq: Every evening | ORAL | 0 refills | Status: DC | PRN

## 2021-12-12 NOTE — Telephone Encounter (Signed)
Pt is requesting to fill her ambien. Please advise [place] ?

## 2021-12-29 ENCOUNTER — Other Ambulatory Visit: Payer: Self-pay | Admitting: Family Medicine

## 2021-12-29 DIAGNOSIS — Z1231 Encounter for screening mammogram for malignant neoplasm of breast: Secondary | ICD-10-CM

## 2022-02-04 ENCOUNTER — Other Ambulatory Visit: Payer: Self-pay | Admitting: Obstetrics and Gynecology

## 2022-02-04 NOTE — Telephone Encounter (Signed)
Last annual exam 01/2021  Scheduled on 02/11/22 Mammogram scheduled on 02/06/22

## 2022-02-05 NOTE — Progress Notes (Unsigned)
56 y.o. [place] Domestic Partner Caucasian female here for annual exam.    Sporadic burning and itching, possible more vulvar.  No discharge or odor.   Some spurts of night sweats. Sleeping ok.  Taking her HRT still, and she wants to continue.   Patient has stopped Celexa and is now taking increased gabapentin.   No vaginal bleeding or spotting.    PCP:  Rita Ohara, MD   Patient's last menstrual period was 01/06/2016.           Sexually active: No.  The current method of family planning is post menopausal status.    Exercising: No.  The patient does not participate in regular exercise at present. Smoker:  no  Health Maintenance: Pap:  02-05-21 AGUS:Neg HR HPV, 01-23-20 Neg:Neg HR HPV, 12-26-18 AGUS:Neg HR HPV History of abnormal Pap:  yes,  05-13-21 Pathology benign from hysteroscopy w/Myosure. 02-05-21 AGUS:Neg HR HPV, 04/17/21 colposcopy ECC benign, 01-06-19 colpo biopsies were normal and 12-29-18 EMB was benign showing EMB polyp. 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 MMG: 02-06-22 neg/BiRads1 Colonoscopy:  09/27/15 Normal f/u 10 years BMD:   n/a  Result  n/a TDaP:  09-24-21 Gardasil:   no HIV: no Hep C: no Screening Labs:   PCP   reports that she has never smoked. She has never used smokeless tobacco. She reports that she does not drink alcohol and does not use drugs.  Past Medical History:  Diagnosis Date   Abnormal Pap smear    ASCUS 08/1999; CIN 11/1999, s/p conization, 12-26-2018 AGUS   Allergic rhinitis, cause unspecified    Anxiety    Complication of anesthesia    "difficulty awakening" remote past   DDD (degenerative disc disease), cervical 05/05/2018   Noted on imaging   DDD (degenerative disc disease), lumbar 06/08/2018   Noted on imaging   Depression    Endometrial polyp    Family history of adverse reaction to anesthesia    mother slow to awaken   GERD (gastroesophageal reflux disease)    Headache    Insomnia    PMB (postmenopausal bleeding) 05/08/2021    Post-nasal drip 05/08/2021   uses  mucinex daily for   Spondylolysis, 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 Rhodia Albright - [facility] - Moderate to severe dysplasia, negative margins   COLONOSCOPY  09/2015   DILATATION & CURETTAGE/HYSTEROSCOPY WITH MYOSURE N/A 01/23/2019   Procedure: [place];  Surgeon: Nunzio Cobbs, MD;  Location: [facility];  Service: Gynecology;  Laterality: N/A;   DILATATION & CURETTAGE/HYSTEROSCOPY WITH MYOSURE N/A 05/13/2021   Procedure: DILATATION & CURETTAGE/HYSTEROSCOPY WITH MYOSURE RESECTION OF ENDOMETRIAL POLYP, ENDOCERVICAL CURETTAGE;  Surgeon: Nunzio Cobbs, MD;  Location: [facility];  Service: Gynecology;  Laterality: N/A;   LUMBAR DISC SURGERY  10/2018   LUMBAR MICRODISCECTOMY Right 11/09/2018   lumbar level:L5-S1 Utilizing microscope-Dr.Gary Cram   NASAL SEPTOPLASTY W/ TURBINOPLASTY Bilateral 10/07/2015  Procedure: NASAL SEPTOPLASTY WITH BILATERAL TURBINATE REDUCTION;  Surgeon: Leta Baptist, MD;  Location: [place];  Service: ENT;  Laterality: Bilateral;   PILONIDAL CYST EXCISION  1988   WISDOM TOOTH EXTRACTION     yrs ago    Current Outpatient Medications  Medication Sig Dispense Refill   ALPRAZolam (XANAX) 0.5 MG tablet TAKE 1/2 TO 1 TABLET BY MOUTH THREE TIMES DAILY AS NEEDED FOR ANXIETY 20 tablet 0   azelastine (ASTELIN) 0.1 % nasal spray 2 (two) times daily.      buPROPion (WELLBUTRIN XL) 300 MG 24 hr tablet TAKE 1 TABLET BY MOUTH EVERY MORNING 90 tablet 2   Cholecalciferol (VITAMIN D) 2000 UNITS tablet Take 2,000 Units by mouth daily.     EPINEPHrine 0.3 mg/0.3 mL IJ SOAJ injection Inject into the muscle as directed.     estradiol (VIVELLE-DOT) 0.0375 MG/24HR APPLY 1 PATCH TOPICALLY TO THE SKIN 2 TIMES A WEEK 8 patch 0   famotidine (PEPCID) 20 MG tablet Take 20 mg by mouth as needed.     fluticasone (FLONASE) 50 MCG/ACT nasal spray INSTILL 2 SPRAYS INTO EACH NOSTRIL twice a day  0   gabapentin (NEURONTIN) 400 MG capsule Take 400 mg by mouth daily.     guaiFENesin (MUCINEX) 600 MG 12 hr tablet Take 1,200 mg by mouth 2 (two) times daily.     levocetirizine (XYZAL) 5 MG tablet TAKE 1 TABLET BY MOUTH EVERY EVENING 90 tablet 3   levocetirizine (XYZAL) 5 MG tablet every evening.     montelukast (SINGULAIR) 10 MG tablet SMARTSIG:1 Tablet(s) By Mouth Every Evening     Olopatadine HCl 0.2 % SOLN Place 1 drop into both eyes daily. 2.5 mL 1   progesterone (PROMETRIUM) 100 MG capsule TAKE 1 CAPSULE(100 MG) BY MOUTH DAILY (Patient taking differently: at bedtime. TAKE 1 CAPSULE(100 MG) BY MOUTH DAILY) 90 capsule 3   UNABLE TO FIND Allergy injection q week at dr [facility]edge offfice     zolpidem (AMBIEN) 10 MG tablet Take 1 tablet (10 mg total) by mouth at bedtime as needed for sleep. 30 tablet 0   citalopram (CELEXA) 10 MG tablet Take by mouth. (Patient not taking: Reported on 02/11/2022)     No current facility-administered medications for this visit.    Family History  Problem Relation Age of Onset   Hypertension Mother    Diabetes Mother    GER disease Mother    Heart disease Father    Depression Sister    Hypertension Sister    Breast cancer Cousin    Colon cancer Maternal Grandmother         died at 53   Diabetes Maternal Grandmother    Alzheimer's disease Paternal Grandmother    Colon polyps Maternal Uncle    GER disease Maternal Uncle    Migraines  Neg Hx     Review of Systems  Genitourinary:  Positive for vaginal pain (external vaginal burning/itching).  All other systems reviewed and are negative.  Exam:   BP 114/80   Pulse 73   Ht 5' 3.5" (1.613 m)   Wt 187 lb (84.8 kg)   LMP 01/06/2016   SpO2 98%   BMI 32.61 kg/m     General appearance: alert, cooperative and appears stated age Head: normocephalic, without obvious abnormality, atraumatic Neck: no adenopathy, supple, symmetrical, trachea midline and thyroid normal to inspection and palpation Lungs: clear to auscultation bilaterally Breasts: normal appearance, no masses or tenderness, No nipple retraction or  dimpling, No nipple discharge or bleeding, No axillary adenopathy Heart: regular rate and rhythm Abdomen: soft, non-tender; no masses, no organomegaly Extremities: extremities normal, atraumatic, no cyanosis or edema Skin: skin color, texture, turgor normal. No rashes or lesions Lymph nodes: cervical, supraclavicular, and axillary nodes normal. Neurologic: grossly normal  Pelvic: External genitalia:  no lesions.  Yellow green curd like discharge              No abnormal inguinal nodes palpated.              Urethra:  normal appearing urethra with no masses, tenderness or lesions              Bartholins and Skenes: normal                 Vagina: normal appearing vagina with normal color and discharge, no lesions              Cervix: no lesions.  Yellow green curd like discharge.              Pap taken: yes Bimanual Exam:  Uterus:  normal size, contour, position, consistency, mobility, non-tender              Adnexa: no mass, fullness, tenderness              Rectal exam: yes.  Confirms.              Anus:  normal sphincter tone, no lesions  Chaperone was present for exam:  Estill Bamberg, CMA  Assessment:   Well woman visit with gynecologic exam. HRT. Hx AGUS pap and normal colpo.  Hx prior CIN 3.  Status post conization.  Vulvovaginitis.   Plan: Mammogram  screening discussed. Self breast awareness reviewed. Pap and HR HPV collected. Guidelines for Calcium, Vitamin D, regular exercise program including cardiovascular and weight bearing exercise. Discused WHI and use of HRT which can increase risk of PE, DVT, MI, stroke and breast cancer.  Will increase Vivelle Dot to 0.05 mg twice weekly.  #24, RF 3 Continue Prometrium 100 mg q hs, #90, RF 3. Wet prep:  positive for yeast and clue cells.  Rx for Diflucan and for Flagyl.  Instructed in use of each. Follow up annually and prn.   After visit summary provided.

## 2022-02-06 ENCOUNTER — Ambulatory Visit
Admission: RE | Admit: 2022-02-06 | Discharge: 2022-02-06 | Disposition: A | Discharge status: Home / Self Care | Payer: BC Managed Care – PPO | Source: Ambulatory Visit | Attending: Family Medicine | Admitting: Family Medicine

## 2022-02-06 DIAGNOSIS — Z1231 Encounter for screening mammogram for malignant neoplasm of breast: Secondary | ICD-10-CM

## 2022-02-11 ENCOUNTER — Encounter: Payer: Self-pay | Admitting: Obstetrics and Gynecology

## 2022-02-11 ENCOUNTER — Ambulatory Visit (INDEPENDENT_AMBULATORY_CARE_PROVIDER_SITE_OTHER): Payer: BC Managed Care – PPO | Admitting: Obstetrics and Gynecology

## 2022-02-11 ENCOUNTER — Other Ambulatory Visit (HOSPITAL_COMMUNITY)
Admission: RE | Admit: 2022-02-11 | Discharge: 2022-02-11 | Disposition: A | Discharge status: Home / Self Care | Payer: BC Managed Care – PPO | Source: Ambulatory Visit | Attending: Obstetrics and Gynecology | Admitting: Obstetrics and Gynecology

## 2022-02-11 VITALS — BP 114/80 | HR 73 | Ht 63.5 in | Wt 187.0 lb

## 2022-02-11 DIAGNOSIS — N760 Acute vaginitis: Secondary | ICD-10-CM

## 2022-02-11 DIAGNOSIS — Z87410 Personal history of cervical dysplasia: Secondary | ICD-10-CM

## 2022-02-11 DIAGNOSIS — Z124 Encounter for screening for malignant neoplasm of cervix: Secondary | ICD-10-CM | POA: Diagnosis present

## 2022-02-11 DIAGNOSIS — Z79890 Hormone replacement therapy (postmenopausal): Secondary | ICD-10-CM | POA: Diagnosis not present

## 2022-02-11 DIAGNOSIS — Z01419 Encounter for gynecological examination (general) (routine) without abnormal findings: Secondary | ICD-10-CM | POA: Diagnosis not present

## 2022-02-11 LAB — WET PREP FOR TRICH, YEAST, CLUE

## 2022-02-11 MED ORDER — METRONIDAZOLE 500 MG PO TABS: 500.0000 mg | ORAL_TABLET | Freq: Two times a day (BID) | ORAL | 0 refills | Status: DC

## 2022-02-11 MED ORDER — PROGESTERONE MICRONIZED 100 MG PO CAPS: 100.0000 mg | ORAL_CAPSULE | Freq: Every day | ORAL | 3 refills | Status: DC

## 2022-02-11 MED ORDER — FLUCONAZOLE 150 MG PO TABS: 150.0000 mg | ORAL_TABLET | Freq: Once | ORAL | 0 refills | Status: AC

## 2022-02-11 MED ORDER — ESTRADIOL 0.05 MG/24HR TD PTTW: 1.0000 | MEDICATED_PATCH | TRANSDERMAL | 3 refills | Status: DC

## 2022-02-11 NOTE — Patient Instructions (Signed)

## 2022-02-17 LAB — CYTOLOGY - PAP
Comment: NEGATIVE
High risk HPV: NEGATIVE

## 2022-02-18 ENCOUNTER — Other Ambulatory Visit: Payer: Self-pay

## 2022-02-18 DIAGNOSIS — R87619 Unspecified abnormal cytological findings in specimens from cervix uteri: Secondary | ICD-10-CM

## 2022-02-19 ENCOUNTER — Ambulatory Visit (INDEPENDENT_AMBULATORY_CARE_PROVIDER_SITE_OTHER): Payer: BC Managed Care – PPO | Admitting: Medical

## 2022-02-19 VITALS — BP 110/60 | HR 67 | Temp 97.9°F | Wt 190.4 lb

## 2022-02-19 DIAGNOSIS — J301 Allergic rhinitis due to pollen: Secondary | ICD-10-CM

## 2022-02-19 DIAGNOSIS — R0981 Nasal congestion: Secondary | ICD-10-CM

## 2022-02-19 DIAGNOSIS — Z9889 Other specified postprocedural states: Secondary | ICD-10-CM | POA: Diagnosis not present

## 2022-02-19 MED ORDER — IPRATROPIUM BROMIDE 0.06 % NA SOLN: 2.0000 | Freq: Two times a day (BID) | NASAL | 1 refills | Status: DC

## 2022-02-19 NOTE — Progress Notes (Signed)
Subjective:  Brandi Carpenter is a 56 y.o. female who presents for Chief Complaint  Patient presents with   nasal issue    Nasal issue- congestion and nothing is helping. Had nasal surgery 2019 and ENT is out til September. Feels like she can breathing some but can't freely     Here for problems with nostrils.  Been going on a while but getting worse.  Had turbinate surgery in 2019, but feels like her nasal passages are cosign back up.   She does have allergy problems, on allergy medications and allergy shots and doesn't feel like this allergy season has been particularly worse, but can't breath out nostrils, both.    Called ENT , but will need a new referral.   No bleeding.  No recent frequency headaches.   No other aggravating or relieving factors.    No other c/o.  Past Medical History:  Diagnosis Date   Abnormal Pap smear    ASCUS 08/1999; CIN 11/1999, s/p conization, 12-26-2018 AGUS   Allergic rhinitis, cause unspecified    Anxiety    Complication of anesthesia    "difficulty awakening" remote past   DDD (degenerative disc disease), cervical 05/05/2018   Noted on imaging   DDD (degenerative disc disease), lumbar 06/08/2018   Noted on imaging   Depression    Endometrial polyp    Family history of adverse reaction to anesthesia    mother slow to awaken   GERD (gastroesophageal reflux disease)    Headache    Insomnia    PMB (postmenopausal bleeding) 05/08/2021   Post-nasal drip 05/08/2021   uses  mucinex daily for   Spondylolysis, 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     buPROPion (WELLBUTRIN XL) 300 MG 24 hr tablet TAKE 1  TABLET BY MOUTH EVERY MORNING 90 tablet 2   Cholecalciferol (VITAMIN D) 2000 UNITS tablet Take 2,000 Units by mouth daily.     citalopram (CELEXA) 10 MG tablet Take by mouth.     EPINEPHrine 0.3 mg/0.3 mL IJ SOAJ injection Inject into the muscle as directed.     estradiol (VIVELLE-DOT) 0.05 MG/24HR patch Place 1 patch (0.05 mg total) onto the skin 2 (two) times a week. 24 patch 3   famotidine (PEPCID) 20 MG tablet Take 20 mg by mouth as needed.     fluticasone (FLONASE) 50 MCG/ACT nasal spray INSTILL 2 SPRAYS INTO EACH NOSTRIL twice a day  0   gabapentin (NEURONTIN) 400 MG capsule Take 400 mg by mouth daily.     guaiFENesin (MUCINEX) 600 MG 12 hr tablet Take 1,200 mg by mouth 2 (two) times daily.     levocetirizine (XYZAL) 5 MG tablet TAKE 1 TABLET BY MOUTH EVERY EVENING 90 tablet 3   montelukast (SINGULAIR) 10 MG tablet SMARTSIG:1 Tablet(s) By Mouth Every Evening     Olopatadine HCl 0.2 % SOLN Place 1 drop into both eyes daily. 2.5 mL 1   progesterone (PROMETRIUM) 100 MG capsule Take 1 capsule (100 mg total) by mouth at bedtime. TAKE 1 CAPSULE(100 MG) BY MOUTH DAILY 90 capsule 3  UNABLE TO FIND Allergy injection q week at dr [facility]     zolpidem (AMBIEN) 10 MG tablet Take 1 tablet (10 mg total) by mouth at bedtime as needed for sleep. 30 tablet 0   No current facility-administered medications on file prior to visit.     The following portions of the patient's history were reviewed and updated as appropriate: allergies, current medications, past family history, past medical history, past social history, past surgical history and problem list.  ROS Otherwise as in subjective above  Objective: BP 110/60   Pulse 67   Temp 97.9 F (36.6 C)   Wt 190 lb 6.4 oz (86.4 kg)   LMP 12/18/2015   BMI 33.20 kg/m   General appearance: alert, no distress, well developed, well nourished No obvious hoarse voice character, when she inhales in 1 nostril there is somewhat of a congested  sound but not significant HEENT: normocephalic, sclerae anicteric, conjunctiva pink and moist, TMs pearly, nares patent, no discharge or erythema, pharynx normal Oral cavity: MMM, no lesions Neck: supple, no lymphadenopathy, no thyromegaly, no masses   Assessment: Encounter Diagnoses  Name Primary?   Allergic rhinitis due to pollen, unspecified seasonality Yes   Nasal congestion    History of nasal surgery      Plan: Discussed her symptoms and concerns and possible causes.  She has a history of nasal turbinate surgery but her nares are actually quite patent.  We discussed the possibility of chronic sinusitis, discussed the possibility of medication intolerance or other.  Currently there is a low suspicion for tumor or mass.  We discussed the possibility of CT sinus.  For now she will stop her Astelin short-term and begin trial of ipratropium nasal spray.  She will continue her other allergy and regular medicines as usual including flonase, xyzal and Singulair.   Referral to ENT for further evaluation  [name] was seen today for nasal issue.  Diagnoses and all orders for this visit:  Allergic rhinitis due to pollen, unspecified seasonality -     Ambulatory referral to ENT  Nasal congestion -     Ambulatory referral to ENT  History of nasal surgery -     Ambulatory referral to ENT  Other orders -     ipratropium (ATROVENT) 0.06 % nasal spray; Place 2 sprays into both nostrils in the morning and at bedtime.    Follow up: with ENT

## 2022-02-23 ENCOUNTER — Ambulatory Visit: Payer: BC Managed Care – PPO | Admitting: Family Medicine

## 2022-02-23 NOTE — Progress Notes (Unsigned)
GYNECOLOGY  VISIT   HPI: 56 y.o.   Domestic Partner  Caucasian  female   Pillow with Patient's last menstrual period was 12/18/2015.   here for  Colpo and Endometrial Bx   GYNECOLOGIC HISTORY: Patient's last menstrual period was 12/18/2015. Contraception:  PMP Menopausal hormone therapy:  Vivelle Dot Last mammogram:  02-06-22 normal Cat.C BiRADS 1 Last pap smear:   02-11-22 Atypical glandular cells Neg HPV        OB History     Gravida  0   Para  0   Term  0   Preterm  0   AB  0   Living  0      SAB  0   IAB  0   Ectopic  0   Multiple  0   Live Births                 Patient Active Problem List   Diagnosis Date Noted   Atypical glandular cells of undetermined significance (AGUS) on cervical Pap smear 01/08/2019   Endometrial polyp 01/08/2019   Postmenopausal bleeding 12/26/2018   Lateral epicondylitis of right elbow 07/12/2018   Lumbar radiculopathy, right 07/12/2018   Piriformis syndrome of right side 06/08/2018   Status post cervical spinal fusion 01/27/2016   Headache 05/13/2015   Blurry vision 05/13/2015   Perceived hearing changes 05/13/2015   Migraine headache 04/29/2015   Raynaud phenomenon 08/28/2013   Depression, major, in remission ([place]) 08/28/2013   Allergic conjunctivitis 05/09/2013   Anxiety 03/09/2012   Insomnia 03/09/2012   Allergic rhinitis 03/09/2012    Past Medical History:  Diagnosis Date   Abnormal Pap smear    ASCUS 08/1999; CIN 11/1999, s/p conization, 12-26-2018 AGUS   Allergic rhinitis, cause unspecified    Anxiety    Complication of anesthesia    "difficulty awakening" remote past   DDD (degenerative disc disease), cervical 05/05/2018   Noted on imaging   DDD (degenerative disc disease), lumbar 06/08/2018   Noted on imaging   Depression    Endometrial polyp    Family history of adverse reaction to anesthesia    mother slow to awaken   GERD (gastroesophageal reflux disease)    Headache    Insomnia    PMB  (postmenopausal bleeding) 05/08/2021   Post-nasal drip 05/08/2021   uses  mucinex daily for   Spondylolysis, 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 Rhodia Albright - [facility] - Moderate to severe dysplasia, negative margins   COLONOSCOPY  09/2015   DILATATION & CURETTAGE/HYSTEROSCOPY WITH MYOSURE N/A 01/23/2019   Procedure: [place];  Surgeon: Nunzio Cobbs, MD;  Location: [facility];  Service: Gynecology;  Laterality: N/A;   DILATATION & CURETTAGE/HYSTEROSCOPY WITH MYOSURE N/A 05/13/2021   Procedure: DILATATION & CURETTAGE/HYSTEROSCOPY WITH MYOSURE RESECTION OF ENDOMETRIAL POLYP, ENDOCERVICAL CURETTAGE;  Surgeon: Nunzio Cobbs, MD;  Location: [facility];  Service: Gynecology;  Laterality: N/A;  LUMBAR DISC SURGERY  10/2018   LUMBAR MICRODISCECTOMY Right 11/09/2018   lumbar level:L5-S1 Utilizing microscope-Dr.Gary Cram   NASAL SEPTOPLASTY W/ TURBINOPLASTY Bilateral 10/07/2015   Procedure: NASAL SEPTOPLASTY WITH BILATERAL TURBINATE REDUCTION;  Surgeon: Leta Baptist, MD;  Location: [place];  Service: ENT;  Laterality: Bilateral;   PILONIDAL CYST EXCISION  1988   WISDOM TOOTH EXTRACTION     yrs ago    Current Outpatient Medications  Medication Sig Dispense Refill   ALPRAZolam (XANAX) 0.5 MG tablet TAKE 1/2 TO 1 TABLET BY MOUTH THREE TIMES DAILY AS NEEDED FOR ANXIETY 20 tablet 0   azelastine (ASTELIN) 0.1 %  nasal spray 2 (two) times daily.     buPROPion (WELLBUTRIN XL) 300 MG 24 hr tablet TAKE 1 TABLET BY MOUTH EVERY MORNING 90 tablet 2   Cholecalciferol (VITAMIN D) 2000 UNITS tablet Take 2,000 Units by mouth daily.     citalopram (CELEXA) 10 MG tablet Take by mouth.     EPINEPHrine 0.3 mg/0.3 mL IJ SOAJ injection Inject into the muscle as directed.     estradiol (VIVELLE-DOT) 0.05 MG/24HR patch Place 1 patch (0.05 mg total) onto the skin 2 (two) times a week. 24 patch 3   famotidine (PEPCID) 20 MG tablet Take 20 mg by mouth as needed.     fluticasone (FLONASE) 50 MCG/ACT nasal spray INSTILL 2 SPRAYS INTO EACH NOSTRIL twice a day  0   gabapentin (NEURONTIN) 400 MG capsule Take 400 mg by mouth daily.     guaiFENesin (MUCINEX) 600 MG 12 hr tablet Take 1,200 mg by mouth 2 (two) times daily.     ipratropium (ATROVENT) 0.06 % nasal spray Place 2 sprays into both nostrils in the morning and at bedtime. 15 mL 1   levocetirizine (XYZAL) 5 MG tablet TAKE 1 TABLET BY MOUTH EVERY EVENING 90 tablet 3   montelukast (SINGULAIR) 10 MG tablet SMARTSIG:1 Tablet(s) By Mouth Every Evening     Olopatadine HCl 0.2 % SOLN Place 1 drop into both eyes daily. 2.5 mL 1   progesterone (PROMETRIUM) 100 MG capsule Take 1 capsule (100 mg total) by mouth at bedtime. TAKE 1 CAPSULE(100 MG) BY MOUTH DAILY 90 capsule 3   UNABLE TO FIND Allergy injection q week at dr [facility]edge of[facility]     zolpidem (AMBIEN) 10 MG tablet Take 1 tablet (10 mg total) by mouth at bedtime as needed for sleep. 30 tablet 0   No current facility-administered medications for this visit.     ALLERGIES: Codeine and Pamelor [nortriptyline]  Family History  Problem Relation Age of Onset   Hypertension Mother    Diabetes Mother    GER disease Mother    Heart disease Father    Depression Sister    Hypertension Sister    Breast cancer Cousin    Colon cancer Maternal Grandmother         died at 71   Diabetes Maternal Grandmother    Alzheimer's disease  Paternal Grandmother    Colon polyps Maternal Uncle    GER disease Maternal Uncle    Migraines Neg Hx     Social History   Socioeconomic History   Marital status: Soil scientist    Spouse name: Not on file   Number of children: 0   Years of education: 16   Highest education level: Not on file  Occupational History   Occupation: Naval architect: [employer]  Tobacco Use   Smoking status: Never  Smokeless tobacco: Never  Vaping Use   Vaping Use: Never used  Substance and Sexual Activity   Alcohol use: No    Alcohol/week: 0.0 standard drinks of alcohol   Drug use: Never   Sexual activity: Not Currently    Partners: Female    Birth control/protection: Abstinence, Post-menopausal  Other Topics Concern   Not on file  Social History Narrative   Lives with female partner Juliann Pulse), 2 beagles   Social Determinants of Health   Financial Resource Strain: Not on file  Food Insecurity: Not on file  Transportation Needs: Not on file  Physical Activity: Not on file  Stress: Not on file  Social Connections: Not on file  Intimate Partner Violence: Not on file    Review of Systems  PHYSICAL EXAMINATION:    LMP 12/18/2015     General appearance: alert, cooperative and appears stated age Head: Normocephalic, without obvious abnormality, atraumatic Neck: no adenopathy, supple, symmetrical, trachea midline and thyroid normal to inspection and palpation Lungs: clear to auscultation bilaterally Breasts: normal appearance, no masses or tenderness, No nipple retraction or dimpling, No nipple discharge or bleeding, No axillary or supraclavicular adenopathy Heart: regular rate and rhythm Abdomen: soft, non-tender, no masses,  no organomegaly Extremities: extremities normal, atraumatic, no cyanosis or edema Skin: Skin color, texture, turgor normal. No rashes or lesions Lymph nodes: Cervical, supraclavicular, and axillary nodes normal. No abnormal inguinal nodes  palpated Neurologic: Grossly normal  Pelvic: External genitalia:  no lesions              Urethra:  normal appearing urethra with no masses, tenderness or lesions              Bartholins and Skenes: normal                 Vagina: normal appearing vagina with normal color and discharge, no lesions              Cervix: no lesions                Bimanual Exam:  Uterus:  normal size, contour, position, consistency, mobility, non-tender              Adnexa: no mass, fullness, tenderness              Rectal exam: {yes no:[id number]}.  Confirms.              Anus:  normal sphincter tone, no lesions  Chaperone was present for exam:  ***  ASSESSMENT     PLAN     An After Visit Summary was printed and given to the patient.  ______ minutes face to face time of which over 50% was spent in counseling.

## 2022-02-25 NOTE — Progress Notes (Signed)
Chief Complaint  Patient presents with   Follow-up    Follow up on breathing and wants to discuss meds   Patient saw Suzette Battiest with complaints of persistent nasal congestion. She has h/o nasal turbinate surgery; he felt her nares appeared patent.  Plan was to stop Astelin and try Ipratropium nasal spray, in addition to her regular allergy meds (flonase, xyzal, singulair).  Discussed possibility of CT sinus. She was referred to ENT ([facility]).  She hasn't heard anything from them yet. Dr. Benjamine Mola did her prior surgery. She called for appointment, but couldn't get seen before August/Sept (he was going out of the country).  No change in nasal congestion since switching nasal sprays as recommended. She has been on Flonase x years.  She takes allergy shots. She is using a saline spray, which sometimes helps loosen things. Doesn't use sinus rinses. Doesn't seem to be fixing anything, is getting frustrated.  She has had symptoms x 6 mos, but seems to be getting worse. She reports that it feels so constricted in her nose that it is making her feel panicky. Breathe Right strips help some. Denies sinus pain.  Phlegm in the morning is clear, always thick. Takes 1200 mg Mucinex twice daily regularly  She is under the care of allergist.  Goes regularly for shots, but hasn't seen the doctor or discussed this with them.  Today she also reports that her sister was just dx'd with glioblastoma. Going to [facility]. She became very tearful in discussing this. She is seeing psychiatrist.  She reports they have reduced the wellbutrin dose, stopped celexa. She is wondering if "they are doing the right thing".  Apparently they tried other meds, reported having SE of feeling swollen from other meds. She is sleeping well on gabapentin 415m, only occasionally needs ambien.  Pleased with that change.   PMH, PSH, SH and FH reviewed and updated (sister's glioblastoma)  Outpatient Encounter Medications as of 02/26/2022   Medication Sig Note   ALPRAZolam (XANAX) 0.5 MG tablet TAKE 1/2 TO 1 TABLET BY MOUTH THREE TIMES DAILY AS NEEDED FOR ANXIETY    azelastine (ASTELIN) 0.1 % nasal spray 2 (two) times daily. 09/24/2021: Uses as needed   buPROPion (WELLBUTRIN XL) 150 MG 24 hr tablet Take 150 mg by mouth daily.    Cholecalciferol (VITAMIN D) 2000 UNITS tablet Take 2,000 Units by mouth daily.    EPINEPHrine 0.3 mg/0.3 mL IJ SOAJ injection Inject into the muscle as directed.    estradiol (VIVELLE-DOT) 0.05 MG/24HR patch Place 1 patch (0.05 mg total) onto the skin 2 (two) times a week.    famotidine (PEPCID) 20 MG tablet Take 20 mg by mouth as needed.    fluticasone (FLONASE) 50 MCG/ACT nasal spray INSTILL 2 SPRAYS INTO EACH NOSTRIL twice a day    gabapentin (NEURONTIN) 400 MG capsule Take 400 mg by mouth daily.    guaiFENesin (MUCINEX) 600 MG 12 hr tablet Take 1,200 mg by mouth 2 (two) times daily.    ipratropium (ATROVENT) 0.06 % nasal spray Place 2 sprays into both nostrils in the morning and at bedtime.    levocetirizine (XYZAL) 5 MG tablet TAKE 1 TABLET BY MOUTH EVERY EVENING    montelukast (SINGULAIR) 10 MG tablet SMARTSIG:1 Tablet(s) By Mouth Every Evening    Olopatadine HCl 0.2 % SOLN Place 1 drop into both eyes daily.    progesterone (PROMETRIUM) 100 MG capsule Take 1 capsule (100 mg total) by mouth at bedtime. TAKE 1 CAPSULE(100 MG) BY  MOUTH DAILY    buPROPion (WELLBUTRIN XL) 300 MG 24 hr tablet TAKE 1 TABLET BY MOUTH EVERY MORNING (Patient not taking: Reported on 02/26/2022)    citalopram (CELEXA) 10 MG tablet Take by mouth. (Patient not taking: Reported on 02/26/2022)    zolpidem (AMBIEN) 10 MG tablet Take 1 tablet (10 mg total) by mouth at bedtime as needed for sleep. (Patient not taking: Reported on 02/26/2022) 02/26/2022: Just prn, very rarely   [DISCONTINUED] UNABLE TO FIND Allergy injection q week at dr [facility]edge offfice    No facility-administered encounter medications on file as of 02/26/2022.    Allergies  Allergen Reactions   Codeine Itching, Nausea And Vomiting and Other (See Comments)   Pamelor [Nortriptyline] Tinitus   ROS:  no fever, chills, headaches.  +stress/worsening of moods related to sister's brain cancer diagnosis.  Otherwise had been doing okay.  Improved sleep with gabapentin.  +nasal congestion per HPI.  No sinus pain, cough, shortness of breath, chest pain, rashes or other concerns. See HPI.   PHYSICAL EXAM:  BP 140/80   Pulse 78   Wt 186 lb (84.4 kg)   LMP 12/18/2015   SpO2 99%   BMI 32.43 kg/m   Wt Readings from Last 3 Encounters:  02/26/22 186 lb (84.4 kg)  02/19/22 190 lb 6.4 oz (86.4 kg)  02/11/22 187 lb (84.8 kg)   Well developed female, who became quite tearful in discussing her sister's recent diagnosis. She had full range of affect. Normal eye contact, speech, hygiene and grooming HEENT: conjunctiva and sclera are clear, EOMI.  Nasal mucosa--Some thick white mucus noted on the L, and stringy mucus bilaterally Some sniffing, throat clearing and occasional cough during the visit. OP clear.  Sinuses nontender Neck: no lymphadenopathy or mass Heart: regular rate and rhythm Lungs: clear bilaterally    ASSESSMENT/PLAN:  Allergic rhinitis due to pollen, unspecified seasonality - suboptimally controlled. Switch inhaled steroid. Resume Astelin. Trial sinus rinses. Cont mucinex.  f/u with allergist (ENT ref pending)  Nasal congestion  Current moderate episode of major depressive disorder, unspecified whether recurrent ([place]) - cont care with psych; counseling encouraged. Asked her to make list of meds she has been put on and SE, so we can put in chart  Since you haven't noticed much benefit with the change in nasal sprays, go back to the Astelin and try using it more often. Continue the mucinex twice daily. I recommend trial of sinus rinses rather than just the nasal saline spray.  NeillMed Sinus Rinse Kit vs Neti-pot, use it once or twice  daily. Try switching the Flonase to a different nasal steroid (remember gentle sniffts), such as Nasacort, Nasonex, etc.  I suggest scheduling a follow-up visit with your allergist, since that likely will be much sooner than an ENT, and probably will give you answers, since there appears to still be a component of allergies.  Referral to [facility] (under [facility]) has been authorized.  Call them to schedule appointment and find out if they keep a cancellation list (vs calling daily).

## 2022-02-26 ENCOUNTER — Encounter: Payer: Self-pay | Admitting: Family Medicine

## 2022-02-26 ENCOUNTER — Ambulatory Visit (INDEPENDENT_AMBULATORY_CARE_PROVIDER_SITE_OTHER): Payer: BC Managed Care – PPO | Admitting: Family Medicine

## 2022-02-26 VITALS — BP 140/80 | HR 78 | Wt 186.0 lb

## 2022-02-26 DIAGNOSIS — F321 Major depressive disorder, single episode, moderate: Secondary | ICD-10-CM

## 2022-02-26 DIAGNOSIS — J301 Allergic rhinitis due to pollen: Secondary | ICD-10-CM

## 2022-02-26 DIAGNOSIS — R0981 Nasal congestion: Secondary | ICD-10-CM | POA: Diagnosis not present

## 2022-02-26 NOTE — Patient Instructions (Addendum)
Since you haven't noticed much benefit with the change in nasal sprays, go back to the Astelin and try using it more often. Continue the mucinex twice daily. I recommend trial of sinus rinses rather than just the nasal saline spray.  NeillMed Sinus Rinse Kit vs Neti-pot, use it once or twice daily. Try switching the Flonase to a different nasal steroid (remember gentle sniffts), such as Nasacort, Nasonex, etc.  I suggest scheduling a follow-up visit with your allergist, since that likely will be much sooner than an ENT, and probably will give you answers, since there appears to still be a component of allergies.  Referral to [facility]NT (under [facility]) has been authorized.  Call them to schedule appointment and find out if they keep a cancellation list (vs calling daily).  Please send a MyChart message when you have time--stating which medications you tried and what side effects you had (so that I'm aware don't have to reinvent the wheel in the future).

## 2022-03-06 ENCOUNTER — Encounter: Payer: Self-pay | Admitting: Obstetrics and Gynecology

## 2022-03-06 ENCOUNTER — Ambulatory Visit: Payer: BC Managed Care – PPO | Admitting: Obstetrics and Gynecology

## 2022-03-06 ENCOUNTER — Other Ambulatory Visit (HOSPITAL_COMMUNITY)
Admission: RE | Admit: 2022-03-06 | Discharge: 2022-03-06 | Disposition: A | Discharge status: Home / Self Care | Payer: BC Managed Care – PPO | Source: Ambulatory Visit | Attending: Obstetrics and Gynecology | Admitting: Obstetrics and Gynecology

## 2022-03-06 DIAGNOSIS — R87619 Unspecified abnormal cytological findings in specimens from cervix uteri: Secondary | ICD-10-CM | POA: Diagnosis present

## 2022-03-10 LAB — SURGICAL PATHOLOGY

## 2022-04-06 NOTE — Progress Notes (Unsigned)
GYNECOLOGY  VISIT   HPI: 56 y.o.   Domestic Partner  Caucasian  female   [place] with Patient's last menstrual period was 12/18/2015.   here for pelvic ultrasound.  Had AGUS pap and negative HR HPV 02/11/22.  Colposcopy with endometrial biopsy done 03/06/22.  ECC normal endocervical tissue and endometrial biopsy showed fragments of benign endometrial and endocervical epithelium.  No dysplasia or malignancy noted in either specimen.   Hx recurrent AGUS paps and ultimate diagnoses of benign endometrial polyps removed with hysteroscopy x 2.   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   No bleeding.   Is on HRT.   GYNECOLOGIC HISTORY: Patient's last menstrual period was 12/18/2015. Contraception:  PMP Menopausal hormone therapy: Vivelle Dot, Prometrium Last mammogram:  02-06-22 neg/BiRads1 Last pap smear: 02-11-22 AGUS:Neg HR HPV, 02-05-21 AGUS:Neg HR HPV, 01-23-20 Neg:Neg HR HPV        OB History     Gravida  0   Para  0   Term  0   Preterm  0   AB  0   Living  0      SAB  0   IAB  0   Ectopic  0   Multiple  0   Live Births                 Patient Active Problem List   Diagnosis Date Noted   Atypical glandular cells of undetermined significance (AGUS) on cervical Pap smear 01/08/2019   Endometrial polyp 01/08/2019   Postmenopausal bleeding 12/26/2018   Lateral epicondylitis of right elbow 07/12/2018   Lumbar radiculopathy, right 07/12/2018   Piriformis syndrome of right side 06/08/2018   Status post cervical spinal fusion 01/27/2016   Headache 05/13/2015   Blurry vision 05/13/2015   Perceived hearing changes 05/13/2015   Migraine headache 04/29/2015   Raynaud phenomenon 08/28/2013   Depression, major, 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/28/2013   Allergic conjunctivitis 05/09/2013   Anxiety 03/09/2012   Insomnia 03/09/2012   Allergic rhinitis 03/09/2012    Past Medical History:  Diagnosis Date    Abnormal Pap smear    ASCUS 08/1999; CIN 11/1999, s/p conization, 12-26-2018 AGUS   Allergic rhinitis, cause unspecified    Anxiety    Complication of anesthesia    "difficulty awakening" remote past   DDD (degenerative disc disease), cervical 05/05/2018   Noted on imaging   DDD (degenerative disc disease), lumbar 06/08/2018   Noted on imaging   Depression    Endometrial polyp    Family history of adverse reaction to anesthesia    mother slow to awaken   GERD (gastroesophageal reflux disease)    Headache    Insomnia    PMB (postmenopausal bleeding) 05/08/2021   Post-nasal drip 05/08/2021   uses  mucinex daily for   Spondylolysis, lumbar region 07/13/2003   Noted on imaging   Stenosis, cervical spine 05/05/2018   Noted on imaging   Vitamin D deficiency     Past Surgical History:  Procedure Laterality Date   ANKLE SURGERY  child   left for torn ligaments, left   ANTERIOR CERVICAL DECOMP/DISCECTOMY FUSION N/A 01/27/2016   Procedure: C5-6, C6-7 Anterior Cervical Discectomy and Fusion, Allograft, Plate;  Surgeon: Marybelle Killings, MD;  Location: [place];  Service: Orthopedics;  Laterality: N/A;   CERVICAL CONIZATION W/BX  2001   Laser conization - Dr. Rhodia Albright - wake [facility] - Moderate to severe dysplasia, negative margins  COLONOSCOPY  09/2015   DILATATION & CURETTAGE/HYSTEROSCOPY WITH MYOSURE N/A 01/23/2019   Procedure: DILATATION & CURETTAGE/HYSTEROSCOPY WITH MYOSURE;  Surgeon: Nunzio Cobbs, MD;  Location: [facility];  Service: Gynecology;  Laterality: N/A;   DILATATION & CURETTAGE/HYSTEROSCOPY WITH MYOSURE N/A 05/13/2021   Procedure: DILATATION & CURETTAGE/HYSTEROSCOPY WITH MYOSURE RESECTION OF ENDOMETRIAL POLYP, ENDOCERVICAL CURETTAGE;  Surgeon: Nunzio Cobbs, MD;  Location: [facility];  Service: Gynecology;  Laterality: N/A;   LUMBAR DISC SURGERY  10/2018   LUMBAR MICRODISCECTOMY Right 11/09/2018   lumbar level:L5-S1 Utilizing  microscope-Dr.Gary Cram   NASAL SEPTOPLASTY W/ TURBINOPLASTY Bilateral 10/07/2015   Procedure: NASAL SEPTOPLASTY WITH BILATERAL TURBINATE REDUCTION;  Surgeon: Leta Baptist, MD;  Location: [place];  Service: ENT;  Laterality: Bilateral;   PILONIDAL CYST EXCISION  1988   WISDOM TOOTH EXTRACTION     yrs ago    Current Outpatient Medications  Medication Sig Dispense Refill   ALPRAZolam (XANAX) 0.5 MG tablet TAKE 1/2 TO 1 TABLET BY MOUTH THREE TIMES DAILY AS NEEDED FOR ANXIETY 20 tablet 0   azelastine (ASTELIN) 0.1 % nasal spray 2 (two) times daily.     buPROPion (WELLBUTRIN XL) 150 MG 24 hr tablet Take 150 mg by mouth daily.     Cholecalciferol (VITAMIN D) 2000 UNITS tablet Take 2,000 Units by mouth daily.     citalopram (CELEXA) 10 MG tablet Take by mouth.     EPINEPHrine 0.3 mg/0.3 mL IJ SOAJ injection Inject into the muscle as directed.     estradiol (VIVELLE-DOT) 0.05 MG/24HR patch Place 1 patch (0.05 mg total) onto the skin 2 (two) times a week. 24 patch 3   famotidine (PEPCID) 20 MG tablet Take 20 mg by mouth as needed.     fluticasone (FLONASE) 50 MCG/ACT nasal spray INSTILL 2 SPRAYS INTO EACH NOSTRIL twice a day  0   gabapentin (NEURONTIN) 400 MG capsule Take 400 mg by mouth daily.     guaiFENesin (MUCINEX) 600 MG 12 hr tablet Take 1,200 mg by mouth 2 (two) times daily.     levocetirizine (XYZAL) 5 MG tablet TAKE 1 TABLET BY MOUTH EVERY EVENING 90 tablet 3   montelukast (SINGULAIR) 10 MG tablet SMARTSIG:1 Tablet(s) By Mouth Every Evening     Olopatadine HCl 0.2 % SOLN Place 1 drop into both eyes daily. 2.5 mL 1   progesterone (PROMETRIUM) 100 MG capsule Take 1 capsule (100 mg total) by mouth at bedtime. TAKE 1 CAPSULE(100 MG) BY MOUTH DAILY 90 capsule 3   zolpidem (AMBIEN) 10 MG tablet Take 1 tablet (10 mg total) by mouth at bedtime as needed for sleep. 30 tablet 0   No current facility-administered medications for this visit.     ALLERGIES: Codeine and Pamelor  [nortriptyline]  Family History  Problem Relation Age of Onset   Hypertension Mother    Diabetes Mother    GER disease Mother    Heart disease Father    Brain cancer Sister        glioblastoma   Depression Sister    Hypertension Sister    Colon cancer Maternal Grandmother         died at 65   Diabetes Maternal Grandmother    Alzheimer's disease Paternal Grandmother    Colon polyps Maternal Uncle    GER disease Maternal Uncle    Breast cancer Cousin    Migraines Neg Hx     Social History   Socioeconomic History  Marital status: Soil scientist    Spouse name: Not on file   Number of children: 0   Years of education: 16   Highest education level: Not on file  Occupational History   Occupation: Naval architect: [employer]  Tobacco Use   Smoking status: Never   Smokeless tobacco: Never  Vaping Use   Vaping Use: Never used  Substance and Sexual Activity   Alcohol use: No    Alcohol/week: 0.0 standard drinks of alcohol   Drug use: Never   Sexual activity: Not Currently    Partners: Female    Birth control/protection: Abstinence, Post-menopausal  Other Topics Concern   Not on file  Social History Narrative   Lives with female partner Juliann Pulse), 2 beagles   Social Determinants of Health   Financial Resource Strain: Not on file  Food Insecurity: Not on file  Transportation Needs: Not on file  Physical Activity: Not on file  Stress: Not on file  Social Connections: Not on file  Intimate Partner Violence: Not on file    Review of Systems  All other systems reviewed and are negative.   PHYSICAL EXAMINATION:    BP 130/72   Ht 5' 3.5" (1.613 m)   Wt 187 lb (84.8 kg)   LMP 12/18/2015   BMI 32.61 kg/m     General appearance: alert, cooperative and appears stated age  Pelvic: External genitalia:  no lesions              Urethra:  normal appearing urethra with no masses, tenderness or lesions              Bartholins and Skenes: normal                  Vagina: normal appearing vagina with normal color and discharge, no lesions              Cervix: no lesions                Bimanual Exam:  Uterus:  normal size, contour, position, consistency, mobility, non-tender              Adnexa: no mass, fullness, tenderness             Chaperone was present for exam:  Estill Bamberg, CMA  Pelvic US Uterus 7.5 x 4.28 x 3.76 cm. No myometrial masses.  EMS 6.49 mm, echogenic area - possible mass 1.13 x 0.92 cm in central fundal region. Left ovary 1.57 x 1.01 x 1.06 cm.  Atrophic.  Right ovary 2.21 x 1.61 x 1.38 cm.  Atrophic.   No adnexal masses.  No free fluid.  ASSESSMENT  Hx recurrent AGUS paps. Negative HR HPV.  Echogenic area of pelvic ultrasound, possible endometrial mass.  Hx benign endometrial polyps.  HRT.   PLAN  Ultrasound findings and report reviewed.  Prior paps and surgical pathology specimens reviewed.  Will proceed with sonohysterogram.  If intracavitary mass noted, will proceed with hysteroscopy, Myosure resection of mass, and dilation and curettage.  Will also consider LEEP procedure due to recurrent AGUS pap smears.    An After Visit Summary was printed and given to the patient.  30 min  total time was spent for this patient encounter, including preparation, face-to-face counseling with the patient, coordination of care, and documentation of the encounter.

## 2022-04-07 ENCOUNTER — Ambulatory Visit (INDEPENDENT_AMBULATORY_CARE_PROVIDER_SITE_OTHER): Payer: BC Managed Care – PPO | Admitting: Obstetrics and Gynecology

## 2022-04-07 ENCOUNTER — Ambulatory Visit (INDEPENDENT_AMBULATORY_CARE_PROVIDER_SITE_OTHER): Payer: BC Managed Care – PPO

## 2022-04-07 ENCOUNTER — Encounter: Payer: Self-pay | Admitting: Obstetrics and Gynecology

## 2022-04-07 VITALS — BP 130/72 | Ht 63.5 in | Wt 187.0 lb

## 2022-04-07 DIAGNOSIS — R87619 Unspecified abnormal cytological findings in specimens from cervix uteri: Secondary | ICD-10-CM | POA: Diagnosis not present

## 2022-04-07 DIAGNOSIS — R9389 Abnormal findings on diagnostic imaging of other specified body structures: Secondary | ICD-10-CM

## 2022-04-21 ENCOUNTER — Other Ambulatory Visit: Payer: Self-pay | Admitting: Family Medicine

## 2022-04-21 DIAGNOSIS — G4700 Insomnia, unspecified: Secondary | ICD-10-CM

## 2022-04-21 DIAGNOSIS — F411 Generalized anxiety disorder: Secondary | ICD-10-CM

## 2022-05-12 NOTE — Progress Notes (Unsigned)
GYNECOLOGY  VISIT   HPI: 56 y.o.   Domestic Partner  Caucasian  female   [place] with Patient's last menstrual period was 12/18/2015.   here for sonohysterogram.   Had AGUS pap and negative HR HPV 02/11/22.  Colposcopy with endometrial biopsy done 03/06/22.   ECC normal endocervical tissue and endometrial biopsy showed fragments of benign endometrial and endocervical epithelium.   No dysplasia or malignancy noted in either specimen.   Pelvic US Uterus 7.5 x 4.28 x 3.76 cm. No myometrial masses.  EMS 6.49 mm, echogenic area - possible mass 1.13 x 0.92 cm in central fundal region. Left ovary 1.57 x 1.01 x 1.06 cm.  Atrophic.  Right ovary 2.21 x 1.61 x 1.38 cm.  Atrophic.   No adnexal masses.  No free fluid.   Hx recurrent AGUS paps and ultimate diagnoses of benign endometrial polyps removed with hysteroscopy x 2.    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    No vaginal bleeding.    Is on HRT.   GYNECOLOGIC HISTORY: Patient's last menstrual period was 12/18/2015. Contraception:  PMP Menopausal hormone therapy:  Vivelle Dot, Prometrium Last mammogram:   02-06-22 neg/BiRads1 Last pap smear:  02-11-22 AGUS:Neg HR HPV, 02-05-21 AGUS:Neg HR HPV, 01-23-20 Neg:Neg HR HPV         OB History     Gravida  0   Para  0   Term  0   Preterm  0   AB  0   Living  0      SAB  0   IAB  0   Ectopic  0   Multiple  0   Live Births                 Patient Active Problem List   Diagnosis Date Noted   Atypical glandular cells of undetermined significance (AGUS) on cervical Pap smear 01/08/2019   Endometrial polyp 01/08/2019   Postmenopausal bleeding 12/26/2018   Lateral epicondylitis of right elbow 07/12/2018   Lumbar radiculopathy, right 07/12/2018   Piriformis syndrome of right side 06/08/2018   Status post cervical spinal fusion 01/27/2016   Headache 05/13/2015   Blurry vision 05/13/2015   Perceived  hearing changes 05/13/2015   Migraine headache 04/29/2015   Raynaud phenomenon 08/28/2013   Depression, major, in remission ([place]) 08/28/2013   Allergic conjunctivitis 05/09/2013   Anxiety 03/09/2012   Insomnia 03/09/2012   Allergic rhinitis 03/09/2012    Past Medical History:  Diagnosis Date   Abnormal Pap smear    ASCUS 08/1999; CIN 11/1999, s/p conization, 12-26-2018 AGUS   Allergic rhinitis, cause unspecified    Anxiety    Complication of anesthesia    "difficulty awakening" remote past   DDD (degenerative disc disease), cervical 05/05/2018   Noted on imaging   DDD (degenerative disc disease), lumbar 06/08/2018   Noted on imaging   Depression    Endometrial polyp    Family history of adverse reaction to anesthesia    mother slow to awaken   GERD (gastroesophageal reflux disease)    Headache    Insomnia    PMB (postmenopausal bleeding) 05/08/2021   Post-nasal drip 05/08/2021   uses  mucinex daily for   Spondylolysis, lumbar region 07/13/2003   Noted on imaging   Stenosis, cervical spine 05/05/2018   Noted on imaging   Vitamin D deficiency     Past Surgical History:  Procedure Laterality Date  ANKLE SURGERY  child   left for torn ligaments, left   ANTERIOR CERVICAL DECOMP/DISCECTOMY FUSION N/A 01/27/2016   Procedure: C5-6, C6-7 Anterior Cervical Discectomy and Fusion, Allograft, Plate;  Surgeon: Marybelle Killings, MD;  Location: [place];  Service: Orthopedics;  Laterality: N/A;   CERVICAL CONIZATION W/BX  2001   Laser conization - Dr. Rhodia Albright - wake [facility] - Moderate to severe dysplasia, negative margins   COLONOSCOPY  09/2015   DILATATION & CURETTAGE/HYSTEROSCOPY WITH MYOSURE N/A 01/23/2019   Procedure: [place];  Surgeon: Nunzio Cobbs, MD;  Location: [facility];  Service: Gynecology;  Laterality: N/A;   DILATATION & CURETTAGE/HYSTEROSCOPY WITH MYOSURE N/A 05/13/2021   Procedure: DILATATION &  CURETTAGE/HYSTEROSCOPY WITH MYOSURE RESECTION OF ENDOMETRIAL POLYP, ENDOCERVICAL CURETTAGE;  Surgeon: Nunzio Cobbs, MD;  Location: [facility];  Service: Gynecology;  Laterality: N/A;   LUMBAR DISC SURGERY  10/2018   LUMBAR MICRODISCECTOMY Right 11/09/2018   lumbar level:L5-S1 Utilizing microscope-Dr.Gary Cram   NASAL SEPTOPLASTY W/ TURBINOPLASTY Bilateral 10/07/2015   Procedure: NASAL SEPTOPLASTY WITH BILATERAL TURBINATE REDUCTION;  Surgeon: Leta Baptist, MD;  Location: [place];  Service: ENT;  Laterality: Bilateral;   PILONIDAL CYST EXCISION  1988   WISDOM TOOTH EXTRACTION     yrs ago    Current Outpatient Medications  Medication Sig Dispense Refill   ALPRAZolam (XANAX) 0.5 MG tablet TAKE 1/2 TO 1 TABLET BY MOUTH THREE TIMES DAILY AS NEEDED FOR ANXIETY 20 tablet 0   azelastine (ASTELIN) 0.1 % nasal spray 2 (two) times daily.     buPROPion (WELLBUTRIN XL) 150 MG 24 hr tablet Take 150 mg by mouth daily.     Cholecalciferol (VITAMIN D) 2000 UNITS tablet Take 2,000 Units by mouth daily.     citalopram (CELEXA) 10 MG tablet Take by mouth.     EPINEPHrine 0.3 mg/0.3 mL IJ SOAJ injection Inject into the muscle as directed.     estradiol (VIVELLE-DOT) 0.05 MG/24HR patch Place 1 patch (0.05 mg total) onto the skin 2 (two) times a week. 24 patch 3   famotidine (PEPCID) 20 MG tablet Take 20 mg by mouth as needed.     fluticasone (FLONASE) 50 MCG/ACT nasal spray INSTILL 2 SPRAYS INTO EACH NOSTRIL twice a day  0   gabapentin (NEURONTIN) 400 MG capsule Take 400 mg by mouth daily.     guaiFENesin (MUCINEX) 600 MG 12 hr tablet Take 1,200 mg by mouth 2 (two) times daily.     levocetirizine (XYZAL) 5 MG tablet TAKE 1 TABLET BY MOUTH EVERY EVENING 90 tablet 3   montelukast (SINGULAIR) 10 MG tablet SMARTSIG:1 Tablet(s) By Mouth Every Evening     Olopatadine HCl 0.2 % SOLN Place 1 drop into both eyes daily. 2.5 mL 1   progesterone (PROMETRIUM) 100 MG capsule Take 1  capsule (100 mg total) by mouth at bedtime. TAKE 1 CAPSULE(100 MG) BY MOUTH DAILY 90 capsule 3   zolpidem (AMBIEN) 10 MG tablet TAKE 1 TABLET(10 MG) BY MOUTH AT BEDTIME AS NEEDED FOR SLEEP 30 tablet 0   No current facility-administered medications for this visit.     ALLERGIES: Codeine and Pamelor [nortriptyline]  Family History  Problem Relation Age of Onset   Hypertension Mother    Diabetes Mother    GER disease Mother    Heart disease Father    Brain cancer Sister        glioblastoma   Depression Sister  Hypertension Sister    Colon cancer Maternal Grandmother         died at 54   Diabetes Maternal Grandmother    Alzheimer's disease Paternal Grandmother    Colon polyps Maternal Uncle    GER disease Maternal Uncle    Breast cancer Cousin    Migraines Neg Hx     Social History   Socioeconomic History   Marital status: Soil scientist    Spouse name: Not on file   Number of children: 0   Years of education: 16   Highest education level: Not on file  Occupational History   Occupation: Naval architect: [employer]  Tobacco Use   Smoking status: Never   Smokeless tobacco: Never  Vaping Use   Vaping Use: Never used  Substance and Sexual Activity   Alcohol use: No    Alcohol/week: 0.0 standard drinks of alcohol   Drug use: Never   Sexual activity: Not Currently    Partners: Female    Birth control/protection: Abstinence, Post-menopausal  Other Topics Concern   Not on file  Social History Narrative   Lives with female partner Juliann Pulse), 2 beagles   Social Determinants of Health   Financial Resource Strain: Not on file  Food Insecurity: Not on file  Transportation Needs: Not on file  Physical Activity: Not on file  Stress: Not on file  Social Connections: Not on file  Intimate Partner Violence: Not on file    Review of Systems  PHYSICAL EXAMINATION:    BP 134/78   Ht 5' 3.5" (1.613 m)   Wt 187 lb (84.8 kg)   LMP 12/18/2015   BMI 32.61  kg/m     General appearance: alert, cooperative and appears stated age   Pelvic: External genitalia:  no lesions              Urethra:  normal appearing urethra with no masses, tenderness or lesions              Bartholins and Skenes: normal                 Vagina: normal appearing vagina with normal color and discharge, no lesions              Cervix: no lesions.  Small cervical os and transformation zone.                 Pelvic US Uterus with EMS 8.80 mm.  Left ovary 2.75 cm length Right ovary 3.08 cm length  Sonohysterogram Consent for procedure.  Betadine sterile prep. Tenaculum to anterior cervical lip.  Cannula placed and sterile NS injected.  Filling defects noted:   Left cornua:  0.8 x 0.5 cm Fundal:  0.7 x 0.5 cm Mid uterus:  0.6 x 0.4 cm Small amount of free fluid noted.  Bilateral ovaries are atrophic.   Chaperone was present for exam:  Ashley, [place] tech  ASSESSMENT  Endometrial masses.  AGUS pap. Recurrent.  Hx recurrent endometrial polyps.  HRT.  PLAN  Sonohysterogram findings reviewed along with history of recurrent AGUS paps and polyps.  I am recommending hysteroscopy with Myosure removal of endometrial masses, dilation and curettage, and LEEP procedure.   Risks, benefits, and alternatives reviewed. Risks include but are not limited to bleeding, infection, damage to surrounding organs including uterine perforation requiring hospitalization and laparoscopy, reaction to anesthesia, DVT, PE, death, cervical scarring, need for further treatment and surgery, and possible negative findings. Surgical expectations and  recovery discussed.  Patient wishes to proceed.  After surgical pathology reports are finalized, we will discuss further the possibility of definitive treatment with total laparoscopic hysterectomy, bilateral salpingectomy, and possible bilateral oophorectomy.   An After Visit Summary was printed and given to the patient.  35 min  total time was  spent for this patient encounter, including preparation, face-to-face counseling with the patient, coordination of care, and documentation of the encounter.

## 2022-05-14 ENCOUNTER — Ambulatory Visit: Payer: BC Managed Care – PPO

## 2022-05-14 ENCOUNTER — Telehealth: Payer: Self-pay | Admitting: Obstetrics and Gynecology

## 2022-05-14 ENCOUNTER — Encounter: Payer: Self-pay | Admitting: Obstetrics and Gynecology

## 2022-05-14 ENCOUNTER — Ambulatory Visit (INDEPENDENT_AMBULATORY_CARE_PROVIDER_SITE_OTHER): Payer: BC Managed Care – PPO | Admitting: Obstetrics and Gynecology

## 2022-05-14 VITALS — BP 134/78 | Ht 63.5 in | Wt 187.0 lb

## 2022-05-14 DIAGNOSIS — R87619 Unspecified abnormal cytological findings in specimens from cervix uteri: Secondary | ICD-10-CM

## 2022-05-14 DIAGNOSIS — N9489 Other specified conditions associated with female genital organs and menstrual cycle: Secondary | ICD-10-CM | POA: Diagnosis not present

## 2022-05-14 DIAGNOSIS — R9389 Abnormal findings on diagnostic imaging of other specified body structures: Secondary | ICD-10-CM

## 2022-05-14 NOTE — Telephone Encounter (Signed)
Please precert and schedule hysteroscopy with Myosure removal of endometrial masses, dilation and curettage, colposcopy and LEEP at [facility] in September.   Time needed is 1 and 1/4 hours.   Her diagnoses are endometrial masses, recurrent AGUS paps, history of cervical dysplasia.   Preop needed.

## 2022-05-14 NOTE — Patient Instructions (Signed)
Loop Electrosurgical Excision Procedure Loop electrosurgical excision procedure (LEEP) is the cutting and removal (excision) of tissue from the cervix. The cervix is the bottom part of the uterus that opens into the vagina. The tissue that is removed from the cervix is examined to see if there are cancer cells or cells that might turn into cancer (precancerous cells). LEEP may be done when: You have abnormal bleeding from your cervix. You have an abnormal Pap test result. Your health care provider finds abnormalities on your cervix during an exam. LEEP typically only takes a few minutes and is often done in the health care provider's office. The procedure is safe for women who are trying to get pregnant. The procedure is usually not done during a menstrual period or during pregnancy. Tell a health care provider about: Any allergies you have. All medicines you are taking, including vitamins, herbs, eye drops, creams, and over-the-counter medicines. Any problems you or family members have had with anesthetic medicines. Any bleeding problems you have. Any medical conditions you have or have had. This includes current or past vaginal infections, such as herpes or STIs (sexually transmitted infections). Whether you are pregnant or may be pregnant. If you are having vaginal bleeding on the day of the procedure. What are the risks? Generally, this is a safe procedure. However, problems may occur, including: Infection. Bleeding. Allergic reactions to medicines. Changes or scarring in the cervix. Damage to nearby structures or organs. Increased risk of early (preterm) labor in future pregnancies. What happens before the procedure? Ask your health care provider about: Changing or stopping your regular medicines. This is especially important if you are taking diabetes medicines or blood thinners. Taking medicines such as aspirin and ibuprofen. These medicines can thin your blood. Do not take these  medicines unless your health care provider tells you to take them. Taking over-the-counter medicines, vitamins, herbs, and supplements. Your health care provider may recommend that you take pain medicine before the procedure. Ask your health care provider if you should plan to have a responsible adult take you home after the procedure. What happens during the procedure?  An instrument called a speculum will be placed in your vagina. This will allow your health care provider to see your cervix. You will be given a medicine to numb the area (local anesthetic). The medicine will be injected into your cervix and the surrounding area. A solution will be applied to your cervix. This solution will help the health care provider find the abnormal cells that need to be removed. A thin wire loop will be passed through your vagina to your cervix. The wire will remove layers of abnormal cervical cells. The wire will burn (cauterize) the cervical tissue with an electrical current during cell removal. Open blood vessels will be cauterized to prevent bleeding. You might feel some pressure, aching, and cramping. If you feel like you will faint during the procedure, tell your health care provider right away. A paste may be applied to the cauterized area of your cervix to help control bleeding. The sample of cervical tissue will be sent to a lab and looked at under a microscope. The procedure may vary among health care providers and hospitals. What can I expect after the procedure? After the procedure, it is common to have: Mild abdominal cramps that may last for up to 1 week. A small amount of pink-tinged or bloody vaginal discharge, including light to moderate bleeding, for 1-2 weeks. A brown- or black-colored discharge coming from your   vagina, if a paste was used on the cervix to control bleeding. It is up to you to get the results of your procedure. Ask your health care provider, or the department that is  doing the procedure, when your results will be ready. Follow these instructions at home: Take over-the-counter and prescription medicines only as told by your health care provider. Return to your normal activities as told by your health care provider. Ask your health care provider what activities are safe for you. Do not put anything in your vagina for 2 weeks after the procedure or until your health care provider says that it is okay. This includes tampons, creams, and douches. Do not have sex until your health care provider approves. Keep all follow-up visits. This is important. Contact a health care provider if: You have a fever or chills. You feel very weak. You have blood clots or bleeding that is heavier than a normal menstrual period. Bleeding that soaks a pad in less than 1 hour is considered heavy bleeding. You develop a bad-smelling discharge from your vagina. You have severe abdominal pain or cramping. Summary Loop electrosurgical excision procedure (LEEP) is the removal of tissue from the cervix. The removed tissue will be checked for precancerous cells or cancer cells. LEEP typically only takes a few minutes and is often done in your health care provider's office. Do not put anything in your vagina for 2 weeks after the procedure or until your health care provider says that it is okay. This includes tampons, creams, and douches. Ask your health care provider, or the department that is doing the procedure, when your results will be ready. This information is not intended to replace advice given to you by your health care provider. Make sure you discuss any questions you have with your health care provider. Document Revised: 02/05/2021 Document Reviewed: 02/05/2021 Elsevier Patient Education  [place].

## 2022-05-15 NOTE — Telephone Encounter (Signed)
Spoke with patient. Reviewed surgery dates. Patient request to proceed with surgery on 06/15/22, patient declined earlier date offered.  Advised patient I will forward to business office for return call. I will return call once surgery date and time confirmed. Patient verbalizes understanding and is agreeable.   Surgery request sent.   Routing to [employer] for benefits

## 2022-05-20 ENCOUNTER — Encounter: Payer: Self-pay | Admitting: Internal Medicine

## 2022-05-20 NOTE — Telephone Encounter (Signed)
Left message to call Taylah Dubiel, RN at [facility], [phone], OPT 5.  

## 2022-05-20 NOTE — Telephone Encounter (Signed)
Spoke with patient. Surgery date request confirmed.  Advised surgery is scheduled for 06/15/22, [place] at 48.  Surgery instruction sheet and hospital brochure reviewed, printed copy will be placed at front desk for pick up.  Patient verbalizes understanding and is agreeable.  Routing to [employer] for benefits.

## 2022-05-21 ENCOUNTER — Ambulatory Visit (INDEPENDENT_AMBULATORY_CARE_PROVIDER_SITE_OTHER): Payer: BC Managed Care – PPO | Admitting: Obstetrics and Gynecology

## 2022-05-21 ENCOUNTER — Encounter: Payer: Self-pay | Admitting: Obstetrics and Gynecology

## 2022-05-21 VITALS — BP 122/74 | Ht 63.5 in | Wt 187.0 lb

## 2022-05-21 DIAGNOSIS — N9489 Other specified conditions associated with female genital organs and menstrual cycle: Secondary | ICD-10-CM

## 2022-05-21 DIAGNOSIS — R87619 Unspecified abnormal cytological findings in specimens from cervix uteri: Secondary | ICD-10-CM | POA: Diagnosis not present

## 2022-05-21 NOTE — Progress Notes (Signed)
GYNECOLOGY  VISIT   HPI: 56 y.o.   Domestic Partner  Caucasian  female   Celebration with Patient's last menstrual period was 12/18/2015.   here for pre op exam.   Patient has recurrent AGUS paps.   Had AGUS pap and negative HR HPV 02/11/22.  Colposcopy with endometrial biopsy done 03/06/22.   ECC normal endocervical tissue and endometrial biopsy showed fragments of benign endometrial and endocervical epithelium.   No dysplasia or malignancy noted in either specimen.    Pelvic US 04/07/22: Uterus 7.5 x 4.28 x 3.76 cm. No myometrial masses.  EMS 6.49 mm, echogenic area - possible mass 1.13 x 0.92 cm in central fundal region. Left ovary 1.57 x 1.01 x 1.06 cm.  Atrophic.  Right ovary 2.21 x 1.61 x 1.38 cm.  Atrophic.   No adnexal masses.  No free fluid.  Sonohysterogram 05/14/22: Consent for procedure.  Betadine sterile prep. Tenaculum to anterior cervical lip.  Cannula placed and sterile NS injected.  Filling defects noted:   Left cornua:  0.8 x 0.5 cm Fundal:  0.7 x 0.5 cm Mid uterus:  0.6 x 0.4 cm Small amount of free fluid noted.  Bilateral ovaries are atrophic.    Hx recurrent AGUS paps and ultimate diagnoses of benign endometrial polyps removed with hysteroscopy x 2.    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    No vaginal bleeding.    Is on HRT.    Having joint soreness.  Used Meloxicam in the past.   GYNECOLOGIC HISTORY: Patient's last menstrual period was 12/18/2015. Contraception:  PMP Menopausal hormone therapy:  Vivelle Dot, Prometrium Last mammogram:    02-06-22 neg/BiRads1 Last pap smear:  02-11-22 AGUS:Neg HR HPV, 02-05-21 AGUS:Neg HR HPV, 01-23-20 Neg:Neg HR HPV         OB History     Gravida  0   Para  0   Term  0   Preterm  0   AB  0   Living  0      SAB  0   IAB  0   Ectopic  0   Multiple  0   Live Births                 Patient Active Problem List   Diagnosis  Date Noted   Atypical glandular cells of undetermined significance (AGUS) on cervical Pap smear 01/08/2019   Endometrial polyp 01/08/2019   Postmenopausal bleeding 12/26/2018   Lateral epicondylitis of right elbow 07/12/2018   Lumbar radiculopathy, right 07/12/2018   Piriformis syndrome of right side 06/08/2018   Status post cervical spinal fusion 01/27/2016   Headache 05/13/2015   Blurry vision 05/13/2015   Perceived hearing changes 05/13/2015   Migraine headache 04/29/2015   Raynaud phenomenon 08/28/2013   Depression, major, 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   Allergic conjunctivitis 05/09/2013   Anxiety 03/09/2012   Insomnia 03/09/2012   Allergic rhinitis 03/09/2012    Past Medical History:  Diagnosis Date   Abnormal Pap smear    ASCUS 08/1999; CIN 11/1999, s/p conization, 12-26-2018 AGUS   Allergic rhinitis, cause unspecified    Anxiety    Complication of anesthesia    "difficulty awakening" remote past   DDD (degenerative disc disease), cervical 05/05/2018   Noted on imaging   DDD (degenerative disc disease), lumbar 06/08/2018   Noted on imaging   Depression    Endometrial polyp    Family history of  adverse reaction to anesthesia    mother slow to awaken   GERD (gastroesophageal reflux disease)    Headache    Insomnia    PMB (postmenopausal bleeding) 05/08/2021   Post-nasal drip 05/08/2021   uses  mucinex daily for   Spondylolysis, 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 Rhodia Albright - [facility]  - Moderate to severe dysplasia, negative margins   COLONOSCOPY  09/2015   DILATATION & CURETTAGE/HYSTEROSCOPY WITH MYOSURE N/A 01/23/2019   Procedure: [place];  Surgeon: Nunzio Cobbs, MD;  Location: [facility];  Service: Gynecology;  Laterality: N/A;   DILATATION & CURETTAGE/HYSTEROSCOPY WITH MYOSURE N/A 05/13/2021   Procedure: DILATATION & CURETTAGE/HYSTEROSCOPY WITH MYOSURE RESECTION OF ENDOMETRIAL POLYP, ENDOCERVICAL CURETTAGE;  Surgeon: Nunzio Cobbs, MD;  Location: [facility];  Service: Gynecology;  Laterality: N/A;   LUMBAR DISC SURGERY  10/2018   LUMBAR MICRODISCECTOMY Right 11/09/2018   lumbar level:L5-S1 Utilizing microscope-Dr.Gary Cram   NASAL SEPTOPLASTY W/ TURBINOPLASTY Bilateral 10/07/2015   Procedure: NASAL SEPTOPLASTY WITH BILATERAL TURBINATE REDUCTION;  Surgeon: Leta Baptist, MD;  Location: [place];  Service: ENT;  Laterality: Bilateral;   PILONIDAL CYST EXCISION  1988   WISDOM TOOTH EXTRACTION     yrs ago    Current Outpatient Medications  Medication Sig Dispense Refill   ALPRAZolam (XANAX) 0.5 MG tablet TAKE 1/2 TO 1 TABLET BY MOUTH THREE TIMES DAILY AS NEEDED FOR ANXIETY 20 tablet 0   azelastine (ASTELIN) 0.1 % nasal spray 2 (two) times daily.     buPROPion (WELLBUTRIN XL) 150 MG 24 hr tablet Take 150 mg by mouth daily.     Cholecalciferol (VITAMIN D) 2000 UNITS tablet Take 2,000 Units by mouth daily.     citalopram (CELEXA) 20 MG tablet      EPINEPHrine 0.3 mg/0.3 mL IJ SOAJ injection Inject into the muscle as directed.     estradiol (VIVELLE-DOT) 0.05 MG/24HR patch Place 1 patch (0.05 mg total) onto the skin 2 (two) times a week. 24 patch 3   famotidine (PEPCID) 20 MG tablet Take 20 mg by mouth as needed.     fluticasone (FLONASE) 50 MCG/ACT nasal spray INSTILL 2 SPRAYS INTO EACH NOSTRIL twice a day  0   gabapentin (NEURONTIN) 400 MG capsule Take 400 mg by  mouth daily.     guaiFENesin (MUCINEX) 600 MG 12 hr tablet Take 1,200 mg by mouth 2 (two) times daily.     levocetirizine (XYZAL) 5 MG tablet TAKE 1 TABLET BY MOUTH EVERY EVENING 90 tablet 3   montelukast (SINGULAIR) 10 MG tablet SMARTSIG:1 Tablet(s) By Mouth Every Evening     Olopatadine HCl 0.2 % SOLN Place 1 drop into both eyes daily. 2.5 mL 1   progesterone (PROMETRIUM) 100 MG capsule Take 1 capsule (100 mg total) by mouth at bedtime. TAKE 1 CAPSULE(100 MG) BY MOUTH DAILY 90 capsule 3   zolpidem (AMBIEN) 10 MG  tablet TAKE 1 TABLET(10 MG) BY MOUTH AT BEDTIME AS NEEDED FOR SLEEP 30 tablet 0   No current facility-administered medications for this visit.     ALLERGIES: Codeine and Pamelor [nortriptyline]  Family History  Problem Relation Age of Onset   Hypertension Mother    Diabetes Mother    GER disease Mother    Heart disease Father    Brain cancer Sister        glioblastoma   Depression Sister    Hypertension Sister    Colon cancer Maternal Grandmother         died at 26   Diabetes Maternal Grandmother    Alzheimer's disease Paternal Grandmother    Colon polyps Maternal Uncle    GER disease Maternal Uncle    Breast cancer Cousin    Migraines Neg Hx     Social History   Socioeconomic History   Marital status: Soil scientist    Spouse name: Not on file   Number of children: 0   Years of education: 16   Highest education level: Not on file  Occupational History   Occupation: Naval architect: [employer]  Tobacco Use   Smoking status: Never   Smokeless tobacco: Never  Vaping Use   Vaping Use: Never used  Substance and Sexual Activity   Alcohol use: No    Alcohol/week: 0.0 standard drinks of alcohol   Drug use: Never   Sexual activity: Not Currently    Partners: Female    Birth control/protection: Abstinence, Post-menopausal  Other Topics Concern   Not on file  Social History Narrative   Lives with female partner Juliann Pulse), 2 beagles   Social  Determinants of Health   Financial Resource Strain: Not on file  Food Insecurity: Not on file  Transportation Needs: Not on file  Physical Activity: Not on file  Stress: Not on file  Social Connections: Not on file  Intimate Partner Violence: Not on file    Review of Systems  All other systems reviewed and are negative.   PHYSICAL EXAMINATION:    BP 122/74   Ht 5' 3.5" (1.613 m)   Wt 187 lb (84.8 kg)   LMP 12/18/2015   BMI 32.61 kg/m     General appearance: alert, cooperative and appears stated age Head: Normocephalic, without obvious abnormality, atraumatic Neck: no adenopathy, supple, symmetrical, trachea midline and thyroid normal to inspection and palpation Lungs: clear to auscultation bilaterally Heart: regular rate and rhythm Abdomen: soft, non-tender, no masses,  no organomegaly Extremities: extremities normal, atraumatic, no cyanosis or edema Skin: Skin color, texture, turgor normal. No rashes or lesions Lymph nodes: Cervical, supraclavicular, and axillary nodes normal. No abnormal inguinal nodes palpated Neurologic: Grossly normal  Pelvic: External genitalia:  no lesions              Urethra:  normal appearing urethra with no masses, tenderness or lesions              Bartholins and Skenes: normal                 Vagina: normal appearing vagina with normal color and discharge, no lesions              Cervix: no lesions                Bimanual Exam:  Uterus:  normal size, contour, position, consistency, mobility, non-tender              Adnexa:  no mass, fullness, tenderness         Chaperone was present for exam:  Joy, CMA  ASSESSMENT  Hx recurrent AGUS paps. Negative HR HPV.  Endometrial masses. Hx benign endometrial polyps.  Hx cervical conization.  HRT.    Joint pain.   PLAN  Proceed with hysteroscopy with Myosure removal of endometrial masses, dilation and curettage, and LEEP procedure.   Risks, benefits, and alternatives reviewed with the  patient who wishes to proceed.  Surgical expectations and recovery reviewed.   I recommend she reach out to her PCP regarding her joint pain but wait until after surgery to begin any anti-inflammatory regimen,   An After Visit Summary was printed and given to the patient.

## 2022-05-26 ENCOUNTER — Encounter (HOSPITAL_BASED_OUTPATIENT_CLINIC_OR_DEPARTMENT_OTHER): Payer: Self-pay | Admitting: Obstetrics and Gynecology

## 2022-05-26 NOTE — Progress Notes (Signed)
Spoke w/ via phone for pre-op interview---Brandi Carpenter needs dos----  NONE              Carpenter results------ COVID test -----patient states asymptomatic no test needed Arrive at ------- 0530 NPO after MN NO Solid Food.  Med rec completed Medications to take morning of surgery ----- Wellbutrin, Flonase, Pepcid Diabetic medication ----- Patient instructed no nail polish to be worn day of surgery Patient instructed to bring photo id and insurance card day of surgery Patient aware to have Driver (ride ) / caregiver Brandi Carpenter   for 24 hours after surgery  Patient Special Instructions ----- Pre-Op special Istructions ----- Patient verbalized understanding of instructions that were given at this phone interview. Patient denies shortness of breath, chest pain, fever, cough at this phone interview.

## 2022-05-31 NOTE — H&P (Signed)
Office Visit  05/21/2022 [facility], [name], MD Obstetrics and Gynecology Atypical glandular cells of undetermined significance (AGUS) on cervical Pap smear +1 more Dx Pre-op Exam ; Referred by Rita Ohara, MD Reason for Visit   Additional Documentation  Vitals:  BP 122/74 Ht 5' 3.5" (1.613 m) Wt 84.8 kg LMP 12/18/2015 BMI 32.61 kg/m BSA 1.95 m  More Vitals  Flowsheets:  Anthropometrics, MEWS Score, NEWS, Method of Visit   Encounter Info:  Billing Info, History, Allergies, Detailed Report    All Notes    Progress Notes by Nunzio Cobbs, MD at 05/21/2022 4:30 PM  Author: Nunzio Cobbs, MD Author Type: Physician Filed: 05/21/2022  7:42 PM  Note Status: Signed Cosign: Cosign Not Required Encounter Date: 05/21/2022  Editor: Nunzio Cobbs, MD (Physician)      Prior Versions: 1. Lowella Fairy, CMA (Certified Psychologist, sport and exercise) at 05/21/2022  4:45 PM - Sign when Signing Visit    GYNECOLOGY  VISIT   HPI: 56 y.o.   Domestic Partner  Caucasian  female   [place] with Patient's last menstrual period was 12/18/2015.   here for pre op exam.   Patient has recurrent AGUS paps.    Had AGUS pap and negative HR HPV 02/11/22.  Colposcopy with endometrial biopsy done 03/06/22.   ECC normal endocervical tissue and endometrial biopsy showed fragments of benign endometrial and endocervical epithelium.   No dysplasia or malignancy noted in either specimen.    Pelvic US 04/07/22: Uterus 7.5 x 4.28 x 3.76 cm. No myometrial masses.  EMS 6.49 mm, echogenic area - possible mass 1.13 x 0.92 cm in central fundal region. Left ovary 1.57 x 1.01 x 1.06 cm.  Atrophic.  Right ovary 2.21 x 1.61 x 1.38 cm.  Atrophic.   No adnexal masses.  No free fluid.   Sonohysterogram 05/14/22: Consent for procedure.  Betadine sterile prep. Tenaculum to anterior cervical lip.  Cannula placed and sterile NS injected.  Filling defects  noted:   Left cornua:  0.8 x 0.5 cm Fundal:  0.7 x 0.5 cm Mid uterus:  0.6 x 0.4 cm Small amount of free fluid noted.  Bilateral ovaries are atrophic.    Hx recurrent AGUS paps and ultimate diagnoses of benign endometrial polyps removed with hysteroscopy x 2.    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    No vaginal bleeding.    Is on HRT.    Having joint soreness.  Used Meloxicam in the past.    GYNECOLOGIC HISTORY: Patient's last menstrual period was 12/18/2015. 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 cervical Pap smear 01/08/2019   Endometrial polyp 01/08/2019   Postmenopausal bleeding 12/26/2018   Lateral epicondylitis of right elbow 07/12/2018   Lumbar radiculopathy, right 07/12/2018   Piriformis syndrome of right side 06/08/2018   Status post cervical spinal fusion 01/27/2016   Headache 05/13/2015   Blurry vision 05/13/2015   Perceived hearing changes 05/13/2015   Migraine headache 04/29/2015   Raynaud phenomenon 08/28/2013   Depression, major, 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/28/2013   Allergic conjunctivitis 05/09/2013   Anxiety 03/09/2012   Insomnia 03/09/2012   Allergic rhinitis 03/09/2012          Past Medical History:  Diagnosis Date   Abnormal Pap smear      ASCUS 08/1999; CIN 11/1999, s/p conization, 12-26-2018 AGUS   Allergic rhinitis, cause unspecified     Anxiety     Complication of anesthesia      "difficulty  awakening" remote past   DDD (degenerative disc disease), cervical 05/05/2018    Noted on imaging   DDD (degenerative disc disease), lumbar 06/08/2018    Noted on imaging   Depression     Endometrial polyp     Family history of adverse reaction to anesthesia      mother slow to awaken   GERD (gastroesophageal reflux disease)     Headache     Insomnia     PMB (postmenopausal bleeding) 05/08/2021   Post-nasal drip 05/08/2021    uses  mucinex daily for   Spondylolysis, lumbar region 07/13/2003    Noted on imaging   Stenosis, cervical spine 05/05/2018    Noted on imaging   Vitamin D deficiency             Past Surgical History:  Procedure Laterality Date   ANKLE SURGERY   child    left for torn ligaments, left   ANTERIOR CERVICAL DECOMP/DISCECTOMY FUSION N/A 01/27/2016    Procedure: C5-6, C6-7 Anterior Cervical Discectomy and Fusion, Allograft, Plate;  Surgeon: Marybelle Killings, MD;  Location: [place];  Service: Orthopedics;  Laterality: N/A;   CERVICAL CONIZATION W/BX   2001    Laser conization - Dr. Rhodia Albright [facility] - Moderate to severe dysplasia, negative margins   COLONOSCOPY   09/2015   DILATATION & CURETTAGE/HYSTEROSCOPY WITH MYOSURE N/A 01/23/2019    Procedure: [place];  Surgeon: Nunzio Cobbs, MD;  Location: [facility];  Service: Gynecology;  Laterality: N/A;   DILATATION & CURETTAGE/HYSTEROSCOPY WITH MYOSURE N/A 05/13/2021    Procedure: DILATATION & CURETTAGE/HYSTEROSCOPY WITH MYOSURE RESECTION OF ENDOMETRIAL POLYP, ENDOCERVICAL CURETTAGE;  Surgeon: Nunzio Cobbs, MD;  Location: [facility];  Service: Gynecology;  Laterality: N/A;   LUMBAR DISC SURGERY   10/2018   LUMBAR MICRODISCECTOMY Right 11/09/2018    lumbar level:L5-S1 Utilizing microscope-Dr.Gary Cram   NASAL SEPTOPLASTY W/ TURBINOPLASTY Bilateral 10/07/2015    Procedure: NASAL SEPTOPLASTY WITH BILATERAL TURBINATE REDUCTION;   Surgeon: Leta Baptist, MD;  Location: [place];  Service: ENT;  Laterality: Bilateral;   PILONIDAL CYST EXCISION   1988   WISDOM TOOTH EXTRACTION        yrs ago            Current Outpatient Medications  Medication Sig Dispense Refill   ALPRAZolam (XANAX) 0.5 MG tablet TAKE 1/2 TO 1 TABLET BY MOUTH THREE TIMES DAILY AS NEEDED FOR ANXIETY 20 tablet 0   azelastine (ASTELIN) 0.1 %  nasal spray 2 (two) times daily.       buPROPion (WELLBUTRIN XL) 150 MG 24 hr tablet Take 150 mg by mouth daily.       Cholecalciferol (VITAMIN D) 2000 UNITS tablet Take 2,000 Units by mouth daily.       citalopram (CELEXA) 20 MG tablet         EPINEPHrine 0.3 mg/0.3 mL IJ SOAJ injection Inject into the muscle as directed.       estradiol (VIVELLE-DOT) 0.05 MG/24HR patch Place 1 patch (0.05 mg total) onto the skin 2 (two) times a week. 24 patch 3   famotidine (PEPCID) 20 MG tablet Take 20 mg by mouth as needed.       fluticasone (FLONASE) 50 MCG/ACT nasal spray INSTILL 2 SPRAYS INTO EACH NOSTRIL twice a day   0   gabapentin (NEURONTIN) 400 MG capsule Take 400 mg by mouth daily.       guaiFENesin (MUCINEX) 600 MG 12 hr tablet Take 1,200 mg by mouth 2 (two) times daily.       levocetirizine (XYZAL) 5 MG tablet TAKE 1 TABLET BY MOUTH EVERY EVENING 90 tablet 3   montelukast (SINGULAIR) 10 MG tablet SMARTSIG:1 Tablet(s) By Mouth Every Evening       Olopatadine HCl 0.2 % SOLN Place 1 drop into both eyes daily. 2.5 mL 1   progesterone (PROMETRIUM) 100 MG capsule Take 1 capsule (100 mg total) by mouth at bedtime. TAKE 1 CAPSULE(100 MG) BY MOUTH DAILY 90 capsule 3   zolpidem (AMBIEN) 10 MG tablet TAKE 1 TABLET(10 MG) BY MOUTH AT BEDTIME AS NEEDED FOR SLEEP 30 tablet 0    No current facility-administered medications for this visit.      ALLERGIES: Codeine and Pamelor [nortriptyline]        Family History  Problem Relation Age of Onset   Hypertension Mother     Diabetes Mother     GER disease Mother      Heart disease Father     Brain cancer Sister          glioblastoma   Depression Sister     Hypertension Sister     Colon cancer Maternal Grandmother           died at 39   Diabetes Maternal Grandmother     Alzheimer's disease Paternal Grandmother     Colon polyps Maternal Uncle     GER disease Maternal Uncle     Breast cancer Cousin     Migraines Neg Hx        Social History         Socioeconomic History   Marital status: Soil scientist      Spouse name: Not on file   Number of children: 0   Years of education: 64   Highest education level: Not on file  Occupational History   Occupation: Engineer, water: [employer]  Tobacco Use   Smoking status: Never   Smokeless tobacco: Never  Vaping Use   Vaping Use: Never used  Substance and Sexual Activity   Alcohol use: No      Alcohol/week: 0.0 standard drinks of alcohol   Drug use: Never   Sexual activity: Not Currently      Partners: Female      Birth control/protection: Abstinence, Post-menopausal  Other Topics Concern   Not on file  Social History Narrative    Lives with female partner Juliann Pulse), 2 beagles    Social  Determinants of Health    Financial Resource Strain: Not on file  Food Insecurity: Not on file  Transportation Needs: Not on file  Physical Activity: Not on file  Stress: Not on file  Social Connections: Not on file  Intimate Partner Violence: Not on file      Review of Systems  All other systems reviewed and are negative.     PHYSICAL EXAMINATION:     BP 122/74   Ht 5' 3.5" (1.613 m)   Wt 187 lb (84.8 kg)   LMP 12/18/2015   BMI 32.61 kg/m     General appearance: alert, cooperative and appears stated age Head: Normocephalic, without obvious abnormality, atraumatic Neck: no adenopathy, supple, symmetrical, trachea midline and thyroid normal to inspection and palpation Lungs: clear to auscultation bilaterally Heart: regular rate and rhythm Abdomen: soft, non-tender, no  masses,  no organomegaly Extremities: extremities normal, atraumatic, no cyanosis or edema Skin: Skin color, texture, turgor normal. No rashes or lesions Lymph nodes: Cervical, supraclavicular, and axillary nodes normal. No abnormal inguinal nodes palpated Neurologic: Grossly normal   Pelvic: External genitalia:  no lesions              Urethra:  normal appearing urethra with no masses, tenderness or lesions              Bartholins and Skenes: normal                 Vagina: normal appearing vagina with normal color and discharge, no lesions              Cervix: no lesions                Bimanual Exam:  Uterus:  normal size, contour, position, consistency, mobility, non-tender              Adnexa: no mass, fullness, tenderness         Chaperone was present for exam:  Joy, CMA   ASSESSMENT   Hx recurrent AGUS paps. Negative HR HPV.  Endometrial masses. Hx benign endometrial polyps.  Hx cervical conization.  HRT.     Joint pain.    PLAN   Proceed with hysteroscopy with Myosure removal of endometrial masses, dilation and curettage, and LEEP procedure.   Risks, benefits, and alternatives reviewed with the patient who wishes to proceed.  Surgical expectations and recovery reviewed.    I recommend she reach out to her PCP regarding her joint pain but wait until after surgery to begin any anti-inflammatory regimen,    An After Visit Summary was printed and given to the patient.

## 2022-06-12 DIAGNOSIS — C4491 Basal cell carcinoma of skin, unspecified: Secondary | ICD-10-CM

## 2022-06-12 HISTORY — DX: Basal cell carcinoma of skin, unspecified: C44.91

## 2022-06-15 ENCOUNTER — Encounter (HOSPITAL_BASED_OUTPATIENT_CLINIC_OR_DEPARTMENT_OTHER)
Admission: RE | Disposition: A | Discharge status: Home / Self Care | Payer: Self-pay | Source: Home / Self Care | Attending: Obstetrics and Gynecology

## 2022-06-15 ENCOUNTER — Ambulatory Visit (HOSPITAL_BASED_OUTPATIENT_CLINIC_OR_DEPARTMENT_OTHER)
Admission: RE | Admit: 2022-06-15 | Discharge: 2022-06-15 | Disposition: A | Discharge status: Home / Self Care | Payer: BC Managed Care – PPO | Attending: Obstetrics and Gynecology | Admitting: Obstetrics and Gynecology

## 2022-06-15 ENCOUNTER — Encounter (HOSPITAL_BASED_OUTPATIENT_CLINIC_OR_DEPARTMENT_OTHER): Payer: Self-pay | Admitting: Obstetrics and Gynecology

## 2022-06-15 ENCOUNTER — Ambulatory Visit (HOSPITAL_BASED_OUTPATIENT_CLINIC_OR_DEPARTMENT_OTHER): Payer: BC Managed Care – PPO | Admitting: Anesthesiology

## 2022-06-15 ENCOUNTER — Other Ambulatory Visit: Payer: Self-pay

## 2022-06-15 DIAGNOSIS — R87619 Unspecified abnormal cytological findings in specimens from cervix uteri: Secondary | ICD-10-CM | POA: Diagnosis not present

## 2022-06-15 DIAGNOSIS — N9489 Other specified conditions associated with female genital organs and menstrual cycle: Secondary | ICD-10-CM

## 2022-06-15 DIAGNOSIS — Z6832 Body mass index (BMI) 32.0-32.9, adult: Secondary | ICD-10-CM | POA: Insufficient documentation

## 2022-06-15 DIAGNOSIS — R877 Abnormal histological findings in specimens from female genital organs: Secondary | ICD-10-CM | POA: Diagnosis not present

## 2022-06-15 DIAGNOSIS — K219 Gastro-esophageal reflux disease without esophagitis: Secondary | ICD-10-CM | POA: Insufficient documentation

## 2022-06-15 DIAGNOSIS — R87612 Low grade squamous intraepithelial lesion on cytologic smear of cervix (LGSIL): Secondary | ICD-10-CM | POA: Diagnosis not present

## 2022-06-15 DIAGNOSIS — E669 Obesity, unspecified: Secondary | ICD-10-CM | POA: Diagnosis not present

## 2022-06-15 DIAGNOSIS — C541 Malignant neoplasm of endometrium: Secondary | ICD-10-CM | POA: Diagnosis not present

## 2022-06-15 DIAGNOSIS — Z87410 Personal history of cervical dysplasia: Secondary | ICD-10-CM | POA: Diagnosis not present

## 2022-06-15 DIAGNOSIS — Z79890 Hormone replacement therapy (postmenopausal): Secondary | ICD-10-CM | POA: Insufficient documentation

## 2022-06-15 DIAGNOSIS — Z8742 Personal history of other diseases of the female genital tract: Secondary | ICD-10-CM | POA: Diagnosis not present

## 2022-06-15 HISTORY — PX: DILATATION & CURETTAGE/HYSTEROSCOPY WITH MYOSURE: SHX6511

## 2022-06-15 HISTORY — PX: COLPOSCOPY: SHX161

## 2022-06-15 HISTORY — PX: LEEP: SHX91

## 2022-06-15 LAB — BASIC METABOLIC PANEL
Anion gap: 6 (ref 5–15)
BUN: 13 mg/dL (ref 6–20)
CO2: 25 mmol/L (ref 22–32)
Calcium: 9.1 mg/dL (ref 8.9–10.3)
Chloride: 107 mmol/L (ref 98–111)
Creatinine, Ser: 1.13 mg/dL — ABNORMAL HIGH (ref 0.44–1.00)
GFR, Estimated: 57 mL/min — ABNORMAL LOW (ref >60)
Glucose, Bld: 97 mg/dL (ref 70–99)
Potassium: 3.6 mmol/L (ref 3.5–5.1)
Sodium: 138 mmol/L (ref 135–145)

## 2022-06-15 LAB — CBC
HCT: 37.6 % (ref 36.0–46.0)
Hemoglobin: 12.7 g/dL (ref 12.0–15.0)
MCH: 30.2 pg (ref 26.0–34.0)
MCHC: 33.8 g/dL (ref 30.0–36.0)
MCV: 89.5 fL (ref 80.0–100.0)
Platelets: 229 10*3/uL (ref 150–400)
RBC: 4.2 MIL/uL (ref 3.87–5.11)
RDW: 13 % (ref 11.5–15.5)
WBC: 6.5 10*3/uL (ref 4.0–10.5)
nRBC: 0 % (ref 0.0–0.2)

## 2022-06-15 SURGERY — DILATATION & CURETTAGE/HYSTEROSCOPY WITH MYOSURE
Anesthesia: General

## 2022-06-15 MED ORDER — ACETIC ACID 4% SOLUTION
Status: DC | PRN
  Administered 2022-06-15: 1 via TOPICAL

## 2022-06-15 MED ORDER — FENTANYL CITRATE (PF) 100 MCG/2ML IJ SOLN
INTRAMUSCULAR | Status: DC | PRN
  Administered 2022-06-15 (×2): 50 ug via INTRAVENOUS
  Administered 2022-06-15: 25 ug via INTRAVENOUS

## 2022-06-15 MED ORDER — ONDANSETRON HCL 4 MG/2ML IJ SOLN: 4.0000 mg | Freq: Once | INTRAMUSCULAR | Status: DC | PRN

## 2022-06-15 MED ORDER — DEXAMETHASONE SODIUM PHOSPHATE 10 MG/ML IJ SOLN
INTRAMUSCULAR | Status: AC
  Filled 2022-06-15: qty 1

## 2022-06-15 MED ORDER — ACETAMINOPHEN 160 MG/5ML PO SOLN: 325.0000 mg | ORAL | Status: DC | PRN

## 2022-06-15 MED ORDER — PROPOFOL 10 MG/ML IV BOLUS
INTRAVENOUS | Status: DC | PRN
  Administered 2022-06-15: 160 mg via INTRAVENOUS

## 2022-06-15 MED ORDER — KETOROLAC TROMETHAMINE 30 MG/ML IJ SOLN
INTRAMUSCULAR | Status: AC
  Filled 2022-06-15: qty 1

## 2022-06-15 MED ORDER — OXYCODONE HCL 5 MG PO TABS
ORAL_TABLET | ORAL | Status: AC
  Filled 2022-06-15: qty 1

## 2022-06-15 MED ORDER — LIDOCAINE 2% (20 MG/ML) 5 ML SYRINGE
INTRAMUSCULAR | Status: DC | PRN
  Administered 2022-06-15: 100 mg via INTRAVENOUS

## 2022-06-15 MED ORDER — DEXAMETHASONE SODIUM PHOSPHATE 10 MG/ML IJ SOLN
INTRAMUSCULAR | Status: DC | PRN
  Administered 2022-06-15: 10 mg via INTRAVENOUS

## 2022-06-15 MED ORDER — SODIUM CHLORIDE 0.9 % IR SOLN
Status: DC | PRN
  Administered 2022-06-15: 1

## 2022-06-15 MED ORDER — EPHEDRINE 5 MG/ML INJ
INTRAVENOUS | Status: AC
  Filled 2022-06-15: qty 5

## 2022-06-15 MED ORDER — OXYCODONE HCL 5 MG PO TABS
5.0000 mg | ORAL_TABLET | Freq: Once | ORAL | Status: AC | PRN
  Administered 2022-06-15: 5 mg via ORAL

## 2022-06-15 MED ORDER — PROPOFOL 10 MG/ML IV BOLUS
INTRAVENOUS | Status: AC
  Filled 2022-06-15: qty 20

## 2022-06-15 MED ORDER — LIDOCAINE-EPINEPHRINE 1 %-1:100000 IJ SOLN
INTRAMUSCULAR | Status: DC | PRN
  Administered 2022-06-15: 10 mL

## 2022-06-15 MED ORDER — ACETAMINOPHEN 10 MG/ML IV SOLN
INTRAVENOUS | Status: AC
  Filled 2022-06-15: qty 100

## 2022-06-15 MED ORDER — FENTANYL CITRATE (PF) 100 MCG/2ML IJ SOLN
INTRAMUSCULAR | Status: AC
  Filled 2022-06-15: qty 2

## 2022-06-15 MED ORDER — ACETAMINOPHEN 10 MG/ML IV SOLN
INTRAVENOUS | Status: DC | PRN
  Administered 2022-06-15: 1000 mg via INTRAVENOUS

## 2022-06-15 MED ORDER — LIDOCAINE HCL (PF) 2 % IJ SOLN
INTRAMUSCULAR | Status: AC
  Filled 2022-06-15: qty 5

## 2022-06-15 MED ORDER — OXYCODONE HCL 5 MG/5ML PO SOLN: 5.0000 mg | Freq: Once | ORAL | Status: AC | PRN

## 2022-06-15 MED ORDER — FERRIC SUBSULFATE (BULK) SOLN
Status: DC | PRN
  Administered 2022-06-15: 1 via TOPICAL

## 2022-06-15 MED ORDER — MIDAZOLAM HCL 2 MG/2ML IJ SOLN
INTRAMUSCULAR | Status: AC
  Filled 2022-06-15: qty 2

## 2022-06-15 MED ORDER — LACTATED RINGERS IV SOLN: INTRAVENOUS | Status: DC

## 2022-06-15 MED ORDER — IBUPROFEN 800 MG PO TABS: 800.0000 mg | ORAL_TABLET | Freq: Three times a day (TID) | ORAL | 0 refills | Status: DC | PRN

## 2022-06-15 MED ORDER — MIDAZOLAM HCL 2 MG/2ML IJ SOLN
INTRAMUSCULAR | Status: DC | PRN
  Administered 2022-06-15: 2 mg via INTRAVENOUS

## 2022-06-15 MED ORDER — KETOROLAC TROMETHAMINE 30 MG/ML IJ SOLN
INTRAMUSCULAR | Status: DC | PRN
  Administered 2022-06-15: 30 mg via INTRAVENOUS

## 2022-06-15 MED ORDER — ONDANSETRON HCL 4 MG/2ML IJ SOLN
INTRAMUSCULAR | Status: AC
  Filled 2022-06-15: qty 2

## 2022-06-15 MED ORDER — EPHEDRINE SULFATE-NACL 50-0.9 MG/10ML-% IV SOSY
PREFILLED_SYRINGE | INTRAVENOUS | Status: DC | PRN
  Administered 2022-06-15: 10 mg via INTRAVENOUS
  Administered 2022-06-15: 15 mg via INTRAVENOUS

## 2022-06-15 MED ORDER — POVIDONE-IODINE 10 % EX SWAB: 2.0000 | Freq: Once | CUTANEOUS | Status: DC

## 2022-06-15 MED ORDER — ONDANSETRON HCL 4 MG/2ML IJ SOLN
INTRAMUSCULAR | Status: DC | PRN
  Administered 2022-06-15: 4 mg via INTRAVENOUS

## 2022-06-15 MED ORDER — KETOROLAC TROMETHAMINE 30 MG/ML IJ SOLN: 30.0000 mg | Freq: Once | INTRAMUSCULAR | Status: DC | PRN

## 2022-06-15 MED ORDER — FENTANYL CITRATE (PF) 100 MCG/2ML IJ SOLN: 25.0000 ug | INTRAMUSCULAR | Status: DC | PRN

## 2022-06-15 MED ORDER — ACETAMINOPHEN 325 MG PO TABS: 325.0000 mg | ORAL_TABLET | ORAL | Status: DC | PRN

## 2022-06-15 SURGICAL SUPPLY — 23 items
DEVICE MYOSURE REACH (MISCELLANEOUS) IMPLANT
DILATOR CANAL MILEX (MISCELLANEOUS) IMPLANT
ELECT BALL LEEP 5MM RED (ELECTRODE) ×2 IMPLANT
ELECT LOOP LEEP RND 15X12 GRN (CUTTING LOOP) ×1
ELECT LOOP LEEP SQR 10X10 ORG (CUTTING LOOP) ×1
ELECT REM PT RETURN 9FT ADLT (ELECTROSURGICAL) ×1
ELECTRODE LOOP LP RND 15X12GRN (CUTTING LOOP) IMPLANT
ELECTRODE LOOP LP SQR 10X10ORG (CUTTING LOOP) ×2 IMPLANT
ELECTRODE REM PT RTRN 9FT ADLT (ELECTROSURGICAL) ×2 IMPLANT
GLOVE BIO SURGEON STRL SZ 6.5 (GLOVE) ×2 IMPLANT
GOWN STRL REUS W/TWL LRG LVL3 (GOWN DISPOSABLE) ×2 IMPLANT
IV NS IRRIG 3000ML ARTHROMATIC (IV SOLUTION) ×2 IMPLANT
KIT PROCEDURE FLUENT (KITS) ×2 IMPLANT
KIT TURNOVER CYSTO (KITS) ×2 IMPLANT
PACK VAGINAL MINOR WOMEN LF (CUSTOM PROCEDURE TRAY) ×2 IMPLANT
PACK VAGINAL WOMENS (CUSTOM PROCEDURE TRAY) ×2 IMPLANT
PAD OB MATERNITY 4.3X12.25 (PERSONAL CARE ITEMS) ×2 IMPLANT
SCOPETTES 8  STERILE (MISCELLANEOUS) ×1
SCOPETTES 8 STERILE (MISCELLANEOUS) ×4 IMPLANT
SEAL ROD LENS SCOPE MYOSURE (ABLATOR) ×2 IMPLANT
SUT SILK 2 0 SH (SUTURE) IMPLANT
TOWEL OR 17X26 10 PK STRL BLUE (TOWEL DISPOSABLE) ×4 IMPLANT
TUBE CONNECTING 12X1/4 (SUCTIONS) ×2 IMPLANT

## 2022-06-15 NOTE — Anesthesia Procedure Notes (Signed)
Procedure Name: LMA Insertion Date/Time: 06/15/2022 7:50 AM  Performed by: Mechele Claude, CRNAPre-anesthesia Checklist: Patient identified, Emergency Drugs available, Suction available and Patient being monitored Patient Re-evaluated:Patient Re-evaluated prior to induction Oxygen Delivery Method: Circle system utilized Preoxygenation: Pre-oxygenation with 100% oxygen Induction Type: IV induction Ventilation: Mask ventilation without difficulty LMA: LMA inserted LMA Size: 4.0 Number of attempts: 1 Airway Equipment and Method: Bite block Placement Confirmation: positive ETCO2 Tube secured with: Tape Dental Injury: Teeth and Oropharynx as per pre-operative assessment

## 2022-06-15 NOTE — Op Note (Signed)
OPERATIVE REPORT  PRE-OP DIAGNOSIS:  Recurrent AGUS paps, negative high risk HPV, endometrial masses, history of CIN III, history of endometrial polyps, hormone replacement therapy  POST OP DIAGNOSIS:   Recurrent AGUS paps, negative high risk HPV, endometrial masses, history of CIN III, history of endometrial polyps, hormone replacement therapy   PROCEDURE:  Colposcopy with LEEP conization, ECC, hysteroscopy with Myosure resection of endometrial polyps, dilation and curettage  SURGEON:  Aundria Rud, MD  ANESTHESIA:  LMA, local with 10 cc 1% lidocaine with epinepherine 1:100,000  IVF:  600 cc LR  NS deficit:  125 cc  URINE OUTPUT:   patient voided prior to procedure  EBL:   5 cc  COMPLICATIONS:   None.   INDICATIONS FOR PROCEDURE:    The patient is a 56 year old Caucasian female who presents with recurrent AGUS paps and negative HR HPV.  Her colposcopy showed normal endocervical tissue and a benign endometrial biopsy.  A pelvic ultrasound demonstrated an echogenic area in the fundal region, and a sonohysterogram showed several filling defects.  Her ovaries were normal.  The patient is on hormone replacement therapy.  She has a history of CIN III and a history of recurrent endometrial polyps.    A plan was made to proceed with a LEEP procedure with ECC, hysteroscopy with Myosure resection of endometrial masses, and dilation and curettage.  Risks, benefits, and alternatives were reviewed with the patient who wishes to proceed.   FINDINGS:     Colposcopy was satisfactory and demonstrated no lesions of the cervix or upper vagina.   Hysteroscopy showed multiple polyps scattered throughout the uterine cavity and at the top of the endocervix.  No intracavitary fibroids were noted.  The tubal ostia were visualized.   SPECIMENS:  All specimens were sent to Pathology separately.  The exocervical LEEP specimen was marked at 12:00 with suture and sent to pathology. The  endocervical button was marked at 12:00 with suture and sent to pathology.  The ECC was sent to pathology.  The endometrial polyps were sent to pathology.  The endometrial curettings were sent to pathology.  PROCEDURE:  The patient was reidentified in the preop hold area.  In the operating room, she received an LMA anesthetic.  She was placed in the dorsal lithology position with [employer] stirrups.   A speculum was placed in the vagina. 5% acetic acid was used on the cervix and colposcopy was performed.  See the findings above.   The vaginal and vulva were sterilely prepped, and she was sterilely draped.  A weighted insulated speculum was placed the the vagina.   The cervix was injected with 10 cc 1% lidocaine with epinepherine 1:100,000.  The LEEP was performed with a cutting setting of 50 watts.  An exocervical pass was performed, and the specimen was marked with a suture at the 12:00 position and sent to pathology. An endocervical pass was performed, and the specimen was marked with a suture at the 12:00 position and sent to pathology.  The ECC was performed with the Kevorkian curette and sent to pathology.   A tenaculum was placed on the anterior cervical lip.  The uterus was sounded to almost 8 cm.  The cervix was dilated to a #19 pratt dilator.   The Myosure hysteroscope was introduced into the uterine cavity with the continuous infusion of normal saline.  The findings are as noted above.   The Myosure Reach was introduced into the uterine cavity and the polyps were  resected under direct visualization and then sent to pathology.   The hysteroscope was removed and the endometrium was curetted with a serrated curette throughout 360 degrees.  The specimen was sent to pathology.   The tenaculum was removed.    The cervical LEEP site was coagulated with the cautery ball on a setting of 40 watts.  Care was taken to avoid the cervical os.  Hemostasis was good.  Monsel's solution was  placed.   All vaginal instruments were removed.  There were no complications to the procedure.  All needle, instrument, and sponge counts were correct.  The patient was escorted to the recovery room in stable and awake condition.   Aundria Rud, MD

## 2022-06-15 NOTE — Progress Notes (Signed)
Update to History and Physical  No marked change in status since office preop visit.  Patient examined.  Vitals:   06/15/22 0607  BP: 125/83  Pulse: 69  Resp: 17  Temp: 97.9 F (36.6 C)  SpO2: 97%    CBC    Component Value Date/Time   WBC 6.5 06/15/2022 0649   RBC 4.20 06/15/2022 0649   HGB 12.7 06/15/2022 0649   HGB 13.2 09/24/2021 0946   HCT 37.6 06/15/2022 0649   HCT 39.1 09/24/2021 0946   PLT 229 06/15/2022 0649   PLT 278 09/24/2021 0946   MCV 89.5 06/15/2022 0649   MCV 89 09/24/2021 0946   MCH 30.2 06/15/2022 0649   MCHC 33.8 06/15/2022 0649   RDW 13.0 06/15/2022 0649   RDW 12.3 09/24/2021 0946   LYMPHSABS 1.9 09/24/2021 0946   MONOABS 700 08/12/2016 0917   EOSABS 0.2 09/24/2021 0946   BASOSABS 0.0 09/24/2021 0946    OK to proceed with surgery.

## 2022-06-15 NOTE — Anesthesia Postprocedure Evaluation (Signed)
Anesthesia Post Note  Patient: Brandi Carpenter  Procedure(s) Performed: DILATATION & CURETTAGE/HYSTEROSCOPY WITH MYOSURE LOOP ELECTROSURGICAL EXCISION PROCEDURE (LEEP) COLPOSCOPY     Patient location during evaluation: PACU Anesthesia Type: General Level of consciousness: awake Pain management: pain level controlled Vital Signs Assessment: post-procedure vital signs reviewed and stable Respiratory status: spontaneous breathing Cardiovascular status: stable Postop Assessment: no apparent nausea or vomiting Anesthetic complications: no   No notable events documented.  Last Vitals:  Vitals:   06/15/22 0845 06/15/22 0900  BP: 112/69 122/62  Pulse: 95 82  Resp: 20 19  Temp:  36.7 C  SpO2: 99% 96%    Last Pain:  Vitals:   06/15/22 0900  TempSrc:   PainSc: Pierce Jr

## 2022-06-15 NOTE — Discharge Instructions (Addendum)
Hi Nobie,   Your surgery went well today!  I found and removed several polyps inside your uterus.   Please contact the office for any questions.   Have a good day!  [name], MD    Post Anesthesia Home Care Instructions  Activity: Get plenty of rest for the remainder of the day. A responsible individual must stay with you for 24 hours following the procedure.  For the next 24 hours, DO NOT: -Drive a car -Paediatric nurse -Drink alcoholic beverages -Take any medication unless instructed by your physician -Make any legal decisions or sign important papers.  Meals: Start with liquid foods such as gelatin or soup. Progress to regular foods as tolerated. Avoid greasy, spicy, heavy foods. If nausea and/or vomiting occur, drink only clear liquids until the nausea and/or vomiting subsides. Call your physician if vomiting continues.  Special Instructions/Symptoms: Your throat may feel dry or sore from the anesthesia or the breathing tube placed in your throat during surgery. If this causes discomfort, gargle with warm salt water. The discomfort should disappear within 24 hours.  If you had a scopolamine patch placed behind your ear for the management of post- operative nausea and/or vomiting:  1. The medication in the patch is effective for 72 hours, after which it should be removed.  Wrap patch in a tissue and discard in the trash. Wash hands thoroughly with soap and water. 2. You may remove the patch earlier than 72 hours if you experience unpleasant side effects which may include dry mouth, dizziness or visual disturbances. 3. Avoid touching the patch. Wash your hands with soap and water after contact with the patch.    No acetaminophen/Tylenol until after 1:40 pm today if needed.

## 2022-06-15 NOTE — Transfer of Care (Signed)
Immediate Anesthesia Transfer of Care Note  Patient: ATALIE OROS  Procedure(s) Performed: Procedure(s) (LRB): DILATATION & CURETTAGE/HYSTEROSCOPY WITH MYOSURE (N/A) LOOP ELECTROSURGICAL EXCISION PROCEDURE (LEEP) (N/A) COLPOSCOPY (N/A)  Patient Location: PACU  Anesthesia Type: General  Level of Consciousness: awake, alert  and oriented  Airway & Oxygen Therapy: Patient Spontanous Breathing   Post-op Assessment: Report given to PACU RN and Post -op Vital signs reviewed and stable  Post vital signs: Reviewed and stable  Complications: No apparent anesthesia complications  Last Vitals:  Vitals Value Taken Time  BP 122/99 06/15/22 0842  Temp    Pulse 96 06/15/22 0844  Resp 16 06/15/22 0844  SpO2 97 % 06/15/22 0844  Vitals shown include unvalidated device data.  Last Pain:  Vitals:   06/15/22 0607  TempSrc: Oral  PainSc: 0-No pain      Patients Stated Pain Goal: 5 (29/92/42 6834)  Complications: No notable events documented.

## 2022-06-15 NOTE — Anesthesia Preprocedure Evaluation (Addendum)
Anesthesia Evaluation  Patient identified by MRN, date of birth, ID band Patient awake    Reviewed: Allergy & Precautions, NPO status , Patient's Chart, lab work & pertinent test results  History of Anesthesia Complications (+) PROLONGED EMERGENCE and history of anesthetic complications  Airway Mallampati: I       Dental no notable dental hx.    Pulmonary    Pulmonary exam normal        Cardiovascular negative cardio ROS Normal cardiovascular exam     Neuro/Psych PSYCHIATRIC DISORDERS Anxiety Depression  Neuromuscular disease    GI/Hepatic Neg liver ROS, GERD  Medicated,  Endo/Other  negative endocrine ROS  Renal/GU   negative genitourinary   Musculoskeletal   Abdominal (+) + obese,   Peds  Hematology negative hematology ROS (+)   Anesthesia Other Findings   Reproductive/Obstetrics                            Anesthesia Physical Anesthesia Plan  ASA: 2  Anesthesia Plan: General   Post-op Pain Management:    Induction: Intravenous  PONV Risk Score and Plan: 4 or greater and Ondansetron, Dexamethasone and Midazolam  Airway Management Planned: LMA  Additional Equipment: None  Intra-op Plan:   Post-operative Plan: Extubation in OR  Informed Consent: I have reviewed the patients History and Physical, chart, labs and discussed the procedure including the risks, benefits and alternatives for the proposed anesthesia with the patient or authorized representative who has indicated his/her understanding and acceptance.     Dental advisory given  Plan Discussed with: CRNA  Anesthesia Plan Comments:        Anesthesia Quick Evaluation

## 2022-06-16 NOTE — Progress Notes (Unsigned)
No chief complaint on file.  Knee and ankle pain Weight gain  She is under the care of allergist, getting weekly allergy shots. She is also using Flonase, Xyzal and Singulair, as well as using saline rinses. She is s/p septoplasty and inferior turbinate reductions by Dr. Benjamine Mola in 2017. She saw ENT (Dr. Sabino Gasser) in August with complaints of nasal congestion. Endoscopy demonstrated significant reduction in normal inferior turbinate tissue (R>L). Improvement in subjective nasal airflow with cotton test consistent with empty nose syndrome. He recommended against treatment with fillers due to potential risks.     PMH, PSH, SH reviewed.  She underwent D&C, hysteroscopy with Myosure on Monday, with Dr. Quincy Simmonds.    ROS:  PHYSICAL EXAM:  LMP 12/18/2015   Wt Readings from Last 3 Encounters:  06/15/22 189 lb 4.8 oz (85.9 kg)  05/21/22 187 lb (84.8 kg)  05/14/22 187 lb (84.8 kg)   at CPE 09/2021:  Wt 186 lb 3.2 oz (84.5 kg)  ASSESSMENT/PLAN:

## 2022-06-17 ENCOUNTER — Ambulatory Visit: Payer: BC Managed Care – PPO | Admitting: Family Medicine

## 2022-06-17 ENCOUNTER — Encounter: Payer: Self-pay | Admitting: *Deleted

## 2022-06-17 ENCOUNTER — Encounter (HOSPITAL_BASED_OUTPATIENT_CLINIC_OR_DEPARTMENT_OTHER): Payer: Self-pay | Admitting: Obstetrics and Gynecology

## 2022-06-17 VITALS — BP 130/76 | HR 60 | Ht 63.5 in | Wt 193.2 lb

## 2022-06-17 DIAGNOSIS — H938X3 Other specified disorders of ear, bilateral: Secondary | ICD-10-CM

## 2022-06-17 DIAGNOSIS — Z23 Encounter for immunization: Secondary | ICD-10-CM

## 2022-06-17 DIAGNOSIS — Z6833 Body mass index (BMI) 33.0-33.9, adult: Secondary | ICD-10-CM

## 2022-06-17 DIAGNOSIS — M25561 Pain in right knee: Secondary | ICD-10-CM | POA: Diagnosis not present

## 2022-06-18 ENCOUNTER — Telehealth: Payer: Self-pay

## 2022-06-18 DIAGNOSIS — C541 Malignant neoplasm of endometrium: Secondary | ICD-10-CM

## 2022-06-18 NOTE — Telephone Encounter (Signed)
Nunzio Cobbs, MD  P [facility] Triage Please start a phone note and make a referral to Dr. Valarie Cones.     ENDOMETRIUM, POLYPECTOMY:  - Focal endometrioid adenocarcinoma, FIGO grade 1 involving fragments of  endometrial polyp

## 2022-06-18 NOTE — Telephone Encounter (Signed)
Referral order placed in Epic. 

## 2022-06-18 NOTE — Telephone Encounter (Signed)
Left message for patient to call back and schedule new patient appointment.

## 2022-06-19 ENCOUNTER — Encounter: Payer: Self-pay | Admitting: Gynecologic Oncology

## 2022-06-19 ENCOUNTER — Other Ambulatory Visit: Payer: Self-pay | Admitting: Obstetrics and Gynecology

## 2022-06-19 NOTE — Progress Notes (Signed)
Patient was advised the stop her Vivelle Dot estrogen patch and Prometrium at the time of the phone call on 06/18/22 when we discussed her surgical pathology specimen showing endometrial cancer.   She indicated understanding.

## 2022-06-19 NOTE — Telephone Encounter (Signed)
Spoke with Brandi Carpenter regarding her referral to GYN oncology. She has an appointment scheduled with Dr. Berline Lopes on 06/23/22 at 11:15. Patient agrees to date and time. She has been provided with office address and location. She is also aware of our mask and visitor policy. Patient verbalized understanding and will call with any questions.

## 2022-06-22 NOTE — Progress Notes (Unsigned)
GYNECOLOGY  VISIT   HPI: 56 y.o.   Domestic Partner  Caucasian  female   Brandi Carpenter with Patient's last menstrual period was 12/18/2015.   here for   two week post op follow D&C and LEEP. Final pathology showed focal endometrioid adenocarcinoma, FIGO grade 1 involving fragments of endometrial polyp and cervix with LGSIL on LEEP specimen.  A little bleeding.  Not much cramping lately.  She is off HRT.  Feels a little more warm at night.   Patient saw Dr. Berline Lopes for her new diagnosis of endometrial cancer.  Scheduled for a robotic hysterectomy with BSO July 30, 2022.   GYNECOLOGIC HISTORY: Patient's last menstrual period was 12/18/2015. Contraception:  pmp Menopausal hormone therapy:   Vivelle Dot, Prometrium Last mammogram:  02-06-22 neg/BiRads1 Last pap smear:  02-11-22 AGUS:Neg HR HPV, 02-05-21 AGUS:Neg HR HPV, 01-23-20 Neg:Neg HR HPV  OB History     Gravida  0   Para  0   Term  0   Preterm  0   AB  0   Living  0      SAB  0   IAB  0   Ectopic  0   Multiple  0   Live Births                 Patient Active Problem List   Diagnosis Date Noted   Endometrial adenocarcinoma ([place]) 06/23/2022   Obesity (BMI 30-39.9) 06/23/2022   History of postmenopausal HRT 06/23/2022   Dysplasia of cervix, low grade (CIN 1) 06/23/2022   Atypical glandular cells of undetermined significance (AGUS) on cervical Pap smear 01/08/2019   Endometrial polyp 01/08/2019   Postmenopausal bleeding 12/26/2018   Lateral epicondylitis of right elbow 07/12/2018   Lumbar radiculopathy, right 07/12/2018   Piriformis syndrome of right side 06/08/2018   Status post cervical spinal fusion 01/27/2016   Headache 05/13/2015   Blurry vision 05/13/2015   Perceived hearing changes 05/13/2015   Migraine headache 04/29/2015   Raynaud phenomenon 08/28/2013   Depression, major, 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/2013   Allergic conjunctivitis 05/09/2013   Anxiety 03/09/2012   Insomnia 03/09/2012   Allergic  rhinitis 03/09/2012    Past Medical History:  Diagnosis Date   Abnormal Pap smear    ASCUS 08/1999; CIN 11/1999, s/p conization, 12-26-2018 AGUS   Allergic rhinitis, cause unspecified    Anxiety    Basal cell carcinoma 09/73/5329   Complication of anesthesia    "difficulty awakening" remote past   DDD (degenerative disc disease), cervical 05/05/2018   Noted on imaging   DDD (degenerative disc disease), lumbar 06/08/2018   Noted on imaging   Depression    Endometrial polyp    Family history of adverse reaction to anesthesia    mother slow to awaken   GERD (gastroesophageal reflux disease)    Headache    Insomnia    PMB (postmenopausal bleeding) 05/08/2021   Post-nasal drip 05/08/2021   uses  mucinex daily for   Spondylolysis, lumbar region 07/13/2003   Noted on imaging   Stenosis, cervical spine 05/05/2018   Noted on imaging   Vitamin D deficiency     Past Surgical History:  Procedure Laterality Date   ANKLE SURGERY  child   left for torn ligaments, left   ANTERIOR CERVICAL DECOMP/DISCECTOMY FUSION N/A 01/27/2016   Procedure: C5-6, C6-7 Anterior Cervical Discectomy and Fusion, Allograft, Plate;  Surgeon: Marybelle Killings, MD;  Location: [place] [facility];  Service: Orthopedics;  Laterality: N/A;   CERVICAL CONIZATION  W/BX  2001   Laser conization - Dr. Rhodia Albright - [facility] - Moderate to severe dysplasia, negative margins   COLONOSCOPY  09/2015   COLPOSCOPY N/A 06/15/2022   Procedure: COLPOSCOPY;  Surgeon: Nunzio Cobbs, MD;  Location: [facility];  Service: Gynecology;  Laterality: N/A;   DILATATION & CURETTAGE/HYSTEROSCOPY WITH MYOSURE N/A 01/23/2019   Procedure: DILATATION & CURETTAGE/HYSTEROSCOPY WITH MYOSURE;  Surgeon: Nunzio Cobbs, MD;  Location: [facility];  Service: Gynecology;  Laterality: N/A;   DILATATION & CURETTAGE/HYSTEROSCOPY WITH MYOSURE N/A 05/13/2021   Procedure: DILATATION & CURETTAGE/HYSTEROSCOPY WITH MYOSURE  RESECTION OF ENDOMETRIAL POLYP, ENDOCERVICAL CURETTAGE;  Surgeon: Nunzio Cobbs, MD;  Location: [facility];  Service: Gynecology;  Laterality: N/A;   DILATATION & CURETTAGE/HYSTEROSCOPY WITH MYOSURE N/A 06/15/2022   Procedure: DILATATION & CURETTAGE/HYSTEROSCOPY WITH MYOSURE;  Surgeon: Nunzio Cobbs, MD;  Location: [facility];  Service: Gynecology;  Laterality: N/A;   LEEP N/A 06/15/2022   Procedure: LOOP ELECTROSURGICAL EXCISION PROCEDURE (LEEP);  Surgeon: Nunzio Cobbs, MD;  Location: [facility];  Service: Gynecology;  Laterality: N/A;   LUMBAR DISC SURGERY  10/2018   LUMBAR MICRODISCECTOMY Right 11/09/2018   lumbar level:L5-S1 Utilizing microscope-Dr.Gary Cram   NASAL SEPTOPLASTY W/ TURBINOPLASTY Bilateral 10/07/2015   Procedure: NASAL SEPTOPLASTY WITH BILATERAL TURBINATE REDUCTION;  Surgeon: Leta Baptist, MD;  Location: [place];  Service: ENT;  Laterality: Bilateral;   PILONIDAL CYST EXCISION  1988   WISDOM TOOTH EXTRACTION     yrs ago    Current Outpatient Medications  Medication Sig Dispense Refill   ALPRAZolam (XANAX) 0.5 MG tablet TAKE 1/2 TO 1 TABLET BY MOUTH THREE TIMES DAILY AS NEEDED FOR ANXIETY 20 tablet 0   buPROPion (WELLBUTRIN XL) 150 MG 24 hr tablet Take 150 mg by mouth daily.     Cholecalciferol (VITAMIN D) 2000 UNITS tablet Take 2,000 Units by mouth daily.     citalopram (CELEXA) 20 MG tablet      EPINEPHrine 0.3 mg/0.3 mL IJ SOAJ injection Inject into the muscle as directed.     famotidine (PEPCID) 20 MG tablet Take 20 mg by mouth as needed.     fluticasone (FLONASE) 50 MCG/ACT nasal spray INSTILL 2 SPRAYS INTO EACH NOSTRIL twice a day  0   gabapentin (NEURONTIN) 400 MG capsule Take 400 mg by mouth daily.     guaiFENesin (MUCINEX) 600 MG 12 hr tablet Take 1,200 mg by mouth 2 (two) times daily.     ibuprofen (ADVIL) 800 MG tablet Take 1 tablet (800 mg total) by mouth every 8  (eight) hours as needed. 30 tablet 0   levocetirizine (XYZAL) 5 MG tablet TAKE 1 TABLET BY MOUTH EVERY EVENING 90 tablet 3   montelukast (SINGULAIR) 10 MG tablet SMARTSIG:1 Tablet(s) By Mouth Every Evening     Olopatadine HCl 0.2 % SOLN Place 1 drop into both eyes daily. 2.5 mL 1   senna-docusate (SENOKOT-S) 8.6-50 MG tablet Take 2 tablets by mouth at bedtime. For AFTER surgery, do not take if having diarrhea 30 tablet 0   sodium chloride (OCEAN) 0.65 % SOLN nasal spray Place 1 spray into both nostrils as needed for congestion.     traMADol (ULTRAM) 50 MG tablet Take 1 tablet (50 mg total) by mouth every 6 (six) hours as needed for severe pain. For AFTER surgery only, do not take and drive 10 tablet  0   zolpidem (AMBIEN) 10 MG tablet TAKE 1 TABLET(10 MG) BY MOUTH AT BEDTIME AS NEEDED FOR SLEEP 30 tablet 0   No current facility-administered medications for this visit.     ALLERGIES: Codeine and Pamelor [nortriptyline]  Family History  Problem Relation Age of Onset   Hypertension Mother    Diabetes Mother    GER disease Mother    Heart disease Father    Brain cancer Sister        glioblastoma   Depression Sister    Hypertension Sister    Colon polyps Maternal Uncle    GER disease Maternal Uncle    Colon cancer Maternal Grandmother         died at 5   Diabetes Maternal Grandmother    Alzheimer's disease Paternal Grandmother    Breast cancer Cousin    Migraines Neg Hx    Ovarian cancer Neg Hx    Endometrial cancer Neg Hx    Pancreatic cancer Neg Hx    Prostate cancer Neg Hx     Social History   Socioeconomic History   Marital status: Soil scientist    Spouse name: Not on file   Number of children: 0   Years of education: 16   Highest education level: Not on file  Occupational History   Occupation: Naval architect: [employer]  Tobacco Use   Smoking status: Never   Smokeless tobacco: Never  Vaping Use   Vaping Use: Never used  Substance and Sexual  Activity   Alcohol use: No    Alcohol/week: 0.0 standard drinks of alcohol   Drug use: Never   Sexual activity: Not Currently    Partners: Female    Birth control/protection: Abstinence, Post-menopausal  Other Topics Concern   Not on file  Social History Narrative   Lives with female partner Juliann Pulse), 2 beagles   Social Determinants of Health   Financial Resource Strain: Not on file  Food Insecurity: Not on file  Transportation Needs: Not on file  Physical Activity: Not on file  Stress: Not on file  Social Connections: Not on file  Intimate Partner Violence: Not on file    Review of Systems  All other systems reviewed and are negative.   PHYSICAL EXAMINATION:    BP 118/78 (BP Location: Left Arm, Patient Position: Sitting)   Pulse 67   Resp 18   LMP 12/18/2015   SpO2 98%     General appearance: alert, cooperative and appears stated age  Pelvic: External genitalia:  no lesions              Urethra:  normal appearing urethra with no masses, tenderness or lesions              Bartholins and Skenes: normal                 Vagina: normal appearing vagina with normal color and discharge, no lesions              Cervix: consistent with LEEP.                Bimanual Exam:  Uterus:  normal size, contour, position, consistency, mobility, non-tender              Adnexa: no mass, fullness, tenderness  Chaperone was present for exam:  yes.  ASSESSMENT  Status post hysteroscopic polypectomy, dilation and curettage, LEEP procedure.  Focal endometrioid adenocarcinoma, FIGO grade 1 involving fragments of endometrial polyp  and cervix with LGSIL.  PLAN  Patient will proceed forward with robotic hysterectomy with BSO July 30, 2022 with Dr. Berline Lopes from [place].  She will follow up here upon the discretion of Dr. Berline Lopes.  She will remain off her HRT for now.    An After Visit Summary was printed and given to the patient.

## 2022-06-22 NOTE — Telephone Encounter (Signed)
Patient is scheduled 06/23/2022 at 11:15am with Dr. Berline Lopes.

## 2022-06-23 ENCOUNTER — Inpatient Hospital Stay: Payer: BC Managed Care – PPO | Attending: Gynecologic Oncology | Admitting: Gynecologic Oncology

## 2022-06-23 ENCOUNTER — Inpatient Hospital Stay (HOSPITAL_BASED_OUTPATIENT_CLINIC_OR_DEPARTMENT_OTHER): Payer: BC Managed Care – PPO | Admitting: Gynecologic Oncology

## 2022-06-23 ENCOUNTER — Other Ambulatory Visit: Payer: Self-pay

## 2022-06-23 ENCOUNTER — Encounter: Payer: Self-pay | Admitting: Internal Medicine

## 2022-06-23 VITALS — BP 128/80 | HR 72 | Temp 97.7°F | Resp 16 | Ht 64.0 in | Wt 188.0 lb

## 2022-06-23 DIAGNOSIS — G4700 Insomnia, unspecified: Secondary | ICD-10-CM | POA: Diagnosis not present

## 2022-06-23 DIAGNOSIS — C541 Malignant neoplasm of endometrium: Secondary | ICD-10-CM

## 2022-06-23 DIAGNOSIS — Z9229 Personal history of other drug therapy: Secondary | ICD-10-CM | POA: Insufficient documentation

## 2022-06-23 DIAGNOSIS — Z79899 Other long term (current) drug therapy: Secondary | ICD-10-CM | POA: Diagnosis not present

## 2022-06-23 DIAGNOSIS — N870 Mild cervical dysplasia: Secondary | ICD-10-CM | POA: Diagnosis not present

## 2022-06-23 DIAGNOSIS — E669 Obesity, unspecified: Secondary | ICD-10-CM | POA: Insufficient documentation

## 2022-06-23 DIAGNOSIS — F32A Depression, unspecified: Secondary | ICD-10-CM | POA: Diagnosis not present

## 2022-06-23 DIAGNOSIS — K219 Gastro-esophageal reflux disease without esophagitis: Secondary | ICD-10-CM | POA: Diagnosis not present

## 2022-06-23 DIAGNOSIS — N879 Dysplasia of cervix uteri, unspecified: Secondary | ICD-10-CM | POA: Insufficient documentation

## 2022-06-23 DIAGNOSIS — F419 Anxiety disorder, unspecified: Secondary | ICD-10-CM | POA: Diagnosis not present

## 2022-06-23 DIAGNOSIS — E559 Vitamin D deficiency, unspecified: Secondary | ICD-10-CM | POA: Insufficient documentation

## 2022-06-23 DIAGNOSIS — Z79890 Hormone replacement therapy (postmenopausal): Secondary | ICD-10-CM | POA: Insufficient documentation

## 2022-06-23 DIAGNOSIS — M5030 Other cervical disc degeneration, unspecified cervical region: Secondary | ICD-10-CM | POA: Insufficient documentation

## 2022-06-23 MED ORDER — SENNOSIDES-DOCUSATE SODIUM 8.6-50 MG PO TABS: 2.0000 | ORAL_TABLET | Freq: Every day | ORAL | 0 refills | Status: DC

## 2022-06-23 MED ORDER — TRAMADOL HCL 50 MG PO TABS: 50.0000 mg | ORAL_TABLET | Freq: Four times a day (QID) | ORAL | 0 refills | Status: DC | PRN

## 2022-06-23 NOTE — Patient Instructions (Addendum)
Due to having the recent LEEP procedure on your cervix, we recommend scheduling surgery 6 weeks from this procedure to decrease risk of complications.  Preparing for your Surgery  Plan for surgery on July 30, 2022 with Dr. Jeral Pinch at [place] will be scheduled for robotic assisted total laparoscopic hysterectomy (removal of the uterus and cervix), bilateral salpingo-oophorectomy (removal of both ovaries and fallopian tubes), sentinel lymph node biopsy, possible lymph node dissection, possible laparotomy (larger incision on your abdomen if needed).    Pre-operative Testing -You will receive a phone call from presurgical testing at [facility] to arrange for a pre-operative appointment and lab work.  -Bring your insurance card, copy of an advanced directive if applicable, medication list  -At that visit, you will be asked to sign a consent for a possible blood transfusion in case a transfusion becomes necessary during surgery.  The need for a blood transfusion is rare but having consent is a necessary part of your care.     -You should not be taking blood thinners or aspirin at least ten days prior to surgery unless instructed by your surgeon.  -Do not take supplements such as fish oil (omega 3), red yeast rice, turmeric before your surgery. You want to avoid medications with aspirin in them including headache powders such as BC or Goody's), Excedrin migraine.  Day Before Surgery at [place] will be asked to take in a light diet the day before surgery. You will be advised you can have clear liquids up until 3 hours before your surgery.    Eat a light diet the day before surgery.  Examples including soups, broths, toast, yogurt, mashed potatoes.  AVOID GAS PRODUCING FOODS. Things to avoid include carbonated beverages (fizzy beverages, sodas), raw fruits and raw vegetables (uncooked), or beans.   If your bowels are filled with gas, your surgeon will have  difficulty visualizing your pelvic organs which increases your surgical risks.  Your role in recovery Your role is to become active as soon as directed by your doctor, while still giving yourself time to heal.  Rest when you feel tired. You will be asked to do the following in order to speed your recovery:  - Cough and breathe deeply. This helps to clear and expand your lungs and can prevent pneumonia after surgery.  - [place]. Do mild physical activity. Walking or moving your legs help your circulation and body functions return to normal. Do not try to get up or walk alone the first time after surgery.   -If you develop swelling on one leg or the other, pain in the back of your leg, redness/warmth in one of your legs, please call the office or go to the Emergency Room to have a doppler to rule out a blood clot. For shortness of breath, chest pain-seek care in the Emergency Room as soon as possible. - Actively manage your pain. Managing your pain lets you move in comfort. We will ask you to rate your pain on a scale of zero to 10. It is your responsibility to tell your doctor or nurse where and how much you hurt so your pain can be treated.  Special Considerations -If you are diabetic, you may be placed on insulin after surgery to have closer control over your blood sugars to promote healing and recovery.  This does not mean that you will be discharged on insulin.  If applicable, your oral antidiabetics will be resumed  when you are tolerating a solid diet.  -Your final pathology results from surgery should be available around one week after surgery and the results will be relayed to you when available.  -FMLA forms can be faxed to [phone] and please allow 5-7 business days for completion.  Pain Management After Surgery -You have been prescribed your pain medication and bowel regimen medications before surgery so that you can have these available when you are discharged  from the hospital. The pain medication is for use ONLY AFTER surgery and a new prescription will not be given.   -Make sure that you have Tylenol IF YOU ARE ABLE TO TAKE THESE MEDICATION at home to use on a regular basis after surgery for pain control.   -Review the attached handout on narcotic use and their risks and side effects.   Bowel Regimen -You have been prescribed Sennakot-S to take nightly to prevent constipation especially if you are taking the narcotic pain medication intermittently.  It is important to prevent constipation and drink adequate amounts of liquids. You can stop taking this medication when you are not taking pain medication and you are back on your normal bowel routine.  Risks of Surgery Risks of surgery are low but include bleeding, infection, damage to surrounding structures, re-operation, blood clots, and very rarely death.   Blood Transfusion Information (For the consent to be signed before surgery)  We will be checking your blood type before surgery so in case of emergencies, we will know what type of blood you would need.                                            WHAT IS A BLOOD TRANSFUSION?  A transfusion is the replacement of blood or some of its parts. Blood is made up of multiple cells which provide different functions. Red blood cells carry oxygen and are used for blood loss replacement. White blood cells fight against infection. Platelets control bleeding. Plasma helps clot blood. Other blood products are available for specialized needs, such as hemophilia or other clotting disorders. BEFORE THE TRANSFUSION  Who gives blood for transfusions?  You may be able to donate blood to be used at a later date on yourself (autologous donation). Relatives can be asked to donate blood. This is generally not any safer than if you have received blood from a stranger. The same precautions are taken to ensure safety when a relative's blood is donated. Healthy  volunteers who are fully evaluated to make sure their blood is safe. This is blood bank blood. Transfusion therapy is the safest it has ever been in the practice of medicine. Before blood is taken from a donor, a complete history is taken to make sure that person has no history of diseases nor engages in risky social behavior (examples are intravenous drug use or sexual activity with multiple partners). The donor's travel history is screened to minimize risk of transmitting infections, such as malaria. The donated blood is tested for signs of infectious diseases, such as HIV and hepatitis. The blood is then tested to be sure it is compatible with you in order to minimize the chance of a transfusion reaction. If you or a relative donates blood, this is often done in anticipation of surgery and is not appropriate for emergency situations. It takes many days to process the donated blood. RISKS AND COMPLICATIONS Although  transfusion therapy is very safe and saves many lives, the main dangers of transfusion include:  Getting an infectious disease. Developing a transfusion reaction. This is an allergic reaction to something in the blood you were given. Every precaution is taken to prevent this. The decision to have a blood transfusion has been considered carefully by your caregiver before blood is given. Blood is not given unless the benefits outweigh the risks.  AFTER SURGERY INSTRUCTIONS  Return to work: 4-6 weeks if applicable  Activity: 1. Be up and out of the bed during the day.  Take a nap if needed.  You may walk up steps but be careful and use the hand rail.  Stair climbing will tire you more than you think, you may need to stop part way and rest.   2. No lifting or straining for 6 weeks over 10 pounds. No pushing, pulling, straining for 6 weeks.  3. No driving for around 1 week(s).  Do not drive if you are taking narcotic pain medicine and make sure that your reaction time has returned.   4.  You can shower as soon as the next day after surgery. Shower daily.  Use your regular soap and water (not directly on the incision) and pat your incision(s) dry afterwards; don't rub.  No tub baths or submerging your body in water until cleared by your surgeon. If you have the soap that was given to you by pre-surgical testing that was used before surgery, you do not need to use it afterwards because this can irritate your incisions.   5. No sexual activity and nothing in the vagina for 8-10 weeks.  6. You may experience a small amount of clear drainage from your incisions, which is normal.  If the drainage persists, increases, or changes color please call the office.  7. Do not use creams, lotions, or ointments such as neosporin on your incisions after surgery until advised by your surgeon because they can cause removal of the dermabond glue on your incisions.    8. You may experience vaginal spotting after surgery or around the 6-8 week mark from surgery when the stitches at the top of the vagina begin to dissolve.  The spotting is normal but if you experience heavy bleeding, call our office.  9. Take Tylenol first for pain if you are able to take these medications and only use narcotic pain medication for severe pain not relieved by the Tylenol.  Monitor your Tylenol intake to a max of 4,000 mg in a 24 hour period.   Diet: 1. Low sodium Heart Healthy Diet is recommended but you are cleared to resume your normal (before surgery) diet after your procedure.  2. It is safe to use a laxative, such as Miralax or Colace, if you have difficulty moving your bowels. You have been prescribed Sennakot-S to take at bedtime every evening after surgery to keep bowel movements regular and to prevent constipation.    Wound Care: 1. Keep clean and dry.  Shower daily.  Reasons to call the Doctor: Fever - Oral temperature greater than 100.4 degrees Fahrenheit Foul-smelling vaginal discharge Difficulty  urinating Nausea and vomiting Increased pain at the site of the incision that is unrelieved with pain medicine. Difficulty breathing with or without chest pain New calf pain especially if only on one side Sudden, continuing increased vaginal bleeding with or without clots.   Contacts: For questions or concerns you should contact:  Dr. Jeral Pinch at [phone]  Joylene John, NP  at [phone]  After Hours: call [phone] and have the GYN Oncologist paged/contacted (after 5 pm or on the weekends).  Messages sent via mychart are for non-urgent matters and are not responded to after hours so for urgent needs, please call the after hours number.

## 2022-06-23 NOTE — Progress Notes (Signed)
GYNECOLOGIC ONCOLOGY NEW PATIENT CONSULTATION   Patient Name: Brandi Carpenter  Patient Age: 56 y.o. Date of Service: 06/23/22 Referring Provider: Dr. [name]  Primary Care Provider: Rita Ohara, MD Consulting Provider: Jeral Pinch, MD   Assessment/Plan:   Postmenopausal patient with clinical stage I endometrial cancer  We reviewed the nature of endometrial cancer and its recommended surgical staging, including total hysterectomy, bilateral salpingo-oophorectomy, and lymph node assessment. The patient is a suitable candidate for staging via a minimally invasive approach to surgery.  We reviewed that robotic assistance would be used to complete the surgery.   We discussed that most endometrial cancer is detected early and that decisions regarding adjuvant therapy will be made based on her final pathology.   We reviewed the sentinel lymph node technique. Risks and benefits of sentinel lymph node biopsy was reviewed. We reviewed the technique and ICG dye. The patient DOES NOT have an iodine allergy or known liver dysfunction. We reviewed the false negative rate (0.4%), and that 3% of patients with metastatic disease will not have it detected by SLN biopsy in endometrial cancer. A low risk of allergic reaction to the dye, <0.2% for ICG, has been reported. We also discussed that in the case of failed mapping, which occurs 40% of the time, a bilateral or unilateral lymphadenectomy will be performed at the surgeon's discretion.   Potential benefits of sentinel nodes including a higher detection rate for metastasis due to ultrastaging and potential reduction in operative morbidity. However, there remains uncertainty as to the role for treatment of micrometastatic disease. Further, the benefit of operative morbidity associated with the SLN technique in endometrial cancer is not yet completely known. In other patient populations (e.g. the cervical cancer population) there has been observed reductions  in morbidity with SLN biopsy compared to pelvic lymphadenectomy. Lymphedema, nerve dysfunction and lymphocysts are all potential risks with the SLN technique as with complete lymphadenectomy. Additional risks to the patient include the risk of damage to an internal organ while operating in an altered view (e.g. the black and white image of the robotic fluorescence imaging mode).   The patient was consented for a robotic assisted hysterectomy, bilateral salpingo-oophorectomy, sentinel lymph node evaluation, possible lymph node dissection, possible laparotomy. The risks of surgery were discussed in detail and she understands these to include infection; wound separation; hernia; vaginal cuff separation, injury to adjacent organs such as bowel, bladder, blood vessels, ureters and nerves; bleeding which may require blood transfusion; anesthesia risk; thromboembolic events; possible death; unforeseen complications; possible need for re-exploration; medical complications such as heart attack, stroke, pleural effusion and pneumonia; and, if full lymphadenectomy is performed the risk of lymphedema and lymphocyst. The patient will receive DVT and antibiotic prophylaxis as indicated. She voiced a clear understanding. She had the opportunity to ask questions. Perioperative instructions were reviewed with her. Prescriptions for post-op medications were sent to her pharmacy of choice.  We will plan for definitive surgery 4-6 weeks after recent procedure to decrease surgical morbidity in the setting of recent conization.  We discussed options including close monitoring with the patient off of hormone replacement therapy, initiating progesterone therapy during the interval until surgery, or seeing if the patient would qualify for a preoperative window study at [facility] looking at Megace versus Megace and metformin.  After discussing these options, the patient would like to proceed with scheduling surgery in 5 weeks and not  initiating progesterone therapy.  We also discussed her low-grade cervical dysplasia.  Hysterectomy is not indicated  for treatment of low-grade cervical dysplasia.  She will continue to follow with her OB/GYN after completion surgery for her endometrial cancer with regard to her cervical dysplasia history.  A copy of this note was sent to the patient's referring provider.   70 minutes of total time was spent for this patient encounter, including preparation, face-to-face counseling with the patient and coordination of care, and documentation of the encounter.  Jeral Pinch, MD  Division of Gynecologic Oncology  Department of Obstetrics and Gynecology  [facility]  ___________________________________________  Chief Complaint: No chief complaint on file.   History of Present Illness:  Brandi Carpenter is a 56 y.o. y.o. female who is seen in consultation at the request of Dr. [name] for an evaluation of endometrial cancer.  Patient has a history of high-grade cervical dysplasia status post conization procedure with negative margins (early 2000s).  She endorses having mostly normal Pap smears since that time with occasional mild abnormality seen.  In 2021, the patient had postmenopausal bleeding, ultrasound and sonohysterogram showed likely endometrial polyp.  Endometrial biopsy revealed endometrioid type polyp, no malignancy.  In 2022, she presented with postmenopausal bleeding in the setting of being on hormone replacement therapy.  Pap test showed atypical glandular cells of undetermined significance.  Sonohysterogram showed a 19 mm endometrial mass and endometrial biopsy revealed a benign polyp.  On 05/13/2021, patient underwent hysteroscopy with D&C, endocervical curettage and resection of endometrial polyps.  Pathology from this revealed benign endometrial polyp, no hyperplasia or malignancy.  ECC showed benign endocervical glandular epithelium.  Pap test  on 02/11/2022 showed atypical glandular cells, not otherwise specified.  High risk HPV negative.  An abnormal Pap test, colposcopy was performed with biopsies including endocervical curettage and endometrial biopsy on 03/06/2022.  This showed fragments of normal endocervical tissue mixed with few strips of metaplastic squamous epithelium.  Endometrial biopsy revealed fragments of benign endometrial surface epithelium with fragments of normal endocervical epithelium.  No hyperplasia, malignancy, polyps, or endometritis.  Pelvic ultrasound exam on 04/07/2022 revealed a uterus measuring 7.5 x 4.3 x 3.8 cm.  Endometrial lining measures 6.5 mm with an echogenic area and possible mass measuring up to 1.1 cm in the central fundal region.  Bilateral ovaries normal in appearance and atrophic.  No adnexal masses or free fluid.  Sonohysterogram was performed on 05/14/2022 revealing 3 filling defects of the endometrium.  Patient was taken to the operating room on 06/15/2022 for colposcopy with LEEP, ECC, hysteroscopy with MyoSure resection of endometrial polyps and D&C.  Pathology revealed focal endometrioid adenocarcinoma, FIGO grade 1 involving fragments of an endometrial polyp from the polypectomy.  D&C showed fragments of benign endometrial surface epithelium admixed with fragments of ecto and endocervical epithelium, no hyperplasia or malignancy.  Endocervical curettage showed fragments of benign endocervical epithelium, no malignancy.  LEEP showed L SIL involving ectocervical margin and abutting the endocervical margin from 12-3 o'clock.  L SIL involves the ecto and" cervical margin from 3-6 o'clock.  LSIL involves the ectocervical margin and abuts the endocervical margin from 9-12 o'clock.  No high-grade dysplasia or malignancy seen.  Top-Hat portion of the LEEP shows L SIL focally extending to the inked margin.  Patient reports doing well since surgery.  She is still having some light spotting, enough to wear a  pad.  She has occasional cramping.  She reports regular bowel and bladder function.  She denies any recent weight changes or changes in appetite.  Patient works as a  Corporate treasurer.  She comes in with her partner today.  PAST MEDICAL HISTORY:  Past Medical History:  Diagnosis Date   Abnormal Pap smear    ASCUS 08/1999; CIN 11/1999, s/p conization, 12-26-2018 AGUS   Allergic rhinitis, cause unspecified    Anxiety    Basal cell carcinoma 06/06/3006   Complication of anesthesia    "difficulty awakening" remote past   DDD (degenerative disc disease), cervical 05/05/2018   Noted on imaging   DDD (degenerative disc disease), lumbar 06/08/2018   Noted on imaging   Depression    Endometrial polyp    Family history of adverse reaction to anesthesia    mother slow to awaken   GERD (gastroesophageal reflux disease)    Headache    Insomnia    PMB (postmenopausal bleeding) 05/08/2021   Post-nasal drip 05/08/2021   uses  mucinex daily for   Spondylolysis, 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 Rhodia Albright - [facility] - Moderate to severe dysplasia, negative margins   COLONOSCOPY  09/2015   COLPOSCOPY N/A 06/15/2022   Procedure: COLPOSCOPY;  Surgeon: Nunzio Cobbs, MD;  Location[facility];  Service: Gynecology;  Laterality: N/A;   DILATATION & CURETTAGE/HYSTEROSCOPY WITH MYOSURE N/A 01/23/2019   Procedure: DILATATION & CURETTAGE/HYSTEROSCOPY WITH MYOSURE;   Surgeon: Nunzio Cobbs, MD;  Location: [facility];  Service: Gynecology;  Laterality: N/A;   DILATATION & CURETTAGE/HYSTEROSCOPY WITH MYOSURE N/A 05/13/2021   Procedure: DILATATION & CURETTAGE/HYSTEROSCOPY WITH MYOSURE RESECTION OF ENDOMETRIAL POLYP, ENDOCERVICAL CURETTAGE;  Surgeon: Nunzio Cobbs, MD;  Location: [facility];  Service: Gynecology;  Laterality: N/A;   DILATATION & CURETTAGE/HYSTEROSCOPY WITH MYOSURE N/A 06/15/2022   Procedure: DILATATION & CURETTAGE/HYSTEROSCOPY WITH MYOSURE;  Surgeon: Nunzio Cobbs, MD;  Location: [facility];  Service: Gynecology;  Laterality: N/A;   LEEP N/A 06/15/2022   Procedure: LOOP ELECTROSURGICAL EXCISION PROCEDURE (LEEP);  Surgeon: Nunzio Cobbs, MD;  Location: [facility];  Service: Gynecology;  Laterality: N/A;   LUMBAR DISC SURGERY  10/2018   LUMBAR MICRODISCECTOMY Right 11/09/2018   lumbar level:L5-S1 Utilizing microscope-Dr.Gary Cram   NASAL SEPTOPLASTY W/ TURBINOPLASTY Bilateral 10/07/2015   Procedure: NASAL SEPTOPLASTY WITH BILATERAL TURBINATE REDUCTION;  Surgeon: Leta Baptist, MD;  Location: [place];  Service: ENT;  Laterality: Bilateral;   PILONIDAL CYST EXCISION  1988   WISDOM TOOTH EXTRACTION     yrs ago    OB/GYN HISTORY:  OB History  Gravida Para Term Preterm AB Living  0 0 0 0 0 0  SAB IAB Ectopic Multiple Live Births  0 0 0 0      Patient's last menstrual period was 12/18/2015.  Age at menarche: 18  Age at menopause: 61 Hx of HRT: yes Hx of STDs: denies Last pap: see HPI, 5/202 - AGUS pap, HR HPV negative; 01/2020 - NIML,  HR HPV negative History of abnormal pap smears: yes  SCREENING STUDIES:  Last mammogram: 01/2022  Last colonoscopy: 2017  MEDICATIONS: Outpatient Encounter Medications as of 06/23/2022  Medication Sig   ALPRAZolam (XANAX) 0.5 MG tablet TAKE 1/2 TO 1 TABLET BY MOUTH THREE TIMES DAILY AS  NEEDED FOR ANXIETY   buPROPion (WELLBUTRIN XL) 150 MG 24 hr tablet Take 150 mg by mouth daily.   Cholecalciferol (VITAMIN D) 2000 UNITS tablet Take 2,000 Units by mouth daily.   citalopram (CELEXA) 20 MG tablet    EPINEPHrine 0.3 mg/0.3 mL IJ SOAJ injection Inject into the muscle as directed.   estradiol (VIVELLE-DOT) 0.05 MG/24HR patch Place 1 patch (0.05 mg total) onto the skin 2 (two) times a week.   famotidine (PEPCID) 20 MG tablet Take 20 mg by mouth as needed.   fluticasone (FLONASE) 50 MCG/ACT nasal spray INSTILL 2 SPRAYS INTO EACH NOSTRIL twice a day   gabapentin (NEURONTIN) 400 MG capsule Take 400 mg by mouth daily.   guaiFENesin (MUCINEX) 600 MG 12 hr tablet Take 1,200 mg by mouth 2 (two) times daily.   ibuprofen (ADVIL) 800 MG tablet Take 1 tablet (800 mg total) by mouth every 8 (eight) hours as needed.   levocetirizine (XYZAL) 5 MG tablet TAKE 1 TABLET BY MOUTH EVERY EVENING   montelukast (SINGULAIR) 10 MG tablet SMARTSIG:1 Tablet(s) By Mouth Every Evening   Olopatadine HCl 0.2 % SOLN Place 1 drop into both eyes daily.   senna-docusate (SENOKOT-S) 8.6-50 MG tablet Take 2 tablets by mouth at bedtime. For AFTER surgery, do not take if having diarrhea   sodium chloride (OCEAN) 0.65 % SOLN nasal spray Place 1 spray into both nostrils as needed for congestion.   traMADol (ULTRAM) 50 MG tablet Take 1 tablet (50 mg total) by mouth every 6 (six) hours as needed for severe pain. For AFTER surgery only, do not take and drive   zolpidem (AMBIEN) 10 MG tablet TAKE 1 TABLET(10 MG) BY MOUTH AT BEDTIME AS NEEDED FOR SLEEP   [DISCONTINUED] azelastine (ASTELIN) 0.1 % nasal spray 2 (two) times daily.   progesterone (PROMETRIUM) 100 MG capsule Take 1 capsule (100 mg total) by mouth at bedtime. TAKE 1 CAPSULE(100 MG) BY MOUTH DAILY (Patient not taking: Reported on 06/19/2022)   No facility-administered encounter medications on file as of 06/23/2022.    ALLERGIES:  Allergies  Allergen Reactions    Codeine Itching, Nausea And Vomiting and Other (See Comments)   Pamelor [Nortriptyline] Tinitus     FAMILY HISTORY:  Family History  Problem Relation Age of Onset   Hypertension Mother    Diabetes Mother    GER disease Mother    Heart disease Father    Brain cancer Sister        glioblastoma   Depression Sister    Hypertension Sister    Colon polyps Maternal Uncle    GER disease Maternal Uncle    Colon cancer Maternal Grandmother         died at 75   Diabetes Maternal Grandmother    Alzheimer's disease Paternal Grandmother    Breast cancer Cousin    Migraines Neg Hx    Ovarian cancer Neg Hx    Endometrial cancer Neg Hx    Pancreatic cancer Neg Hx    Prostate cancer Neg Hx      SOCIAL HISTORY:  Social Connections: Not on file    REVIEW OF SYSTEMS:  Denies appetite changes, fevers, chills, fatigue, unexplained weight changes. Denies hearing  loss, neck lumps or masses, mouth sores, ringing in ears or voice changes. Denies cough or wheezing.  Denies shortness of breath. Denies chest pain or palpitations. Denies leg swelling. Denies abdominal distention, pain, blood in stools, constipation, diarrhea, nausea, vomiting, or early satiety. Denies pain with intercourse, dysuria, frequency, hematuria or incontinence. Denies hot flashes, pelvic pain, aginal discharge.   Denies joint pain, back pain or muscle pain/cramps. Denies itching, rash, or wounds. Denies dizziness, headaches, numbness or seizures. Denies swollen lymph nodes or glands, denies easy bruising or bleeding. Denies anxiety, depression, confusion, or decreased concentration.  Physical Exam:  Vital Signs for this encounter:  Blood pressure 128/80, pulse 72, temperature 97.7 F (36.5 C), temperature source Oral, resp. rate 16, height '5\' 4"'$  (1.626 m), weight 188 lb (85.3 kg), last menstrual period 12/18/2015. Body mass index is 32.27 kg/m. General: Alert, oriented, no acute distress.  HEENT: Normocephalic,  atraumatic. Sclera anicteric.  Chest: Clear to auscultation bilaterally. No wheezes, rhonchi, or rales. Cardiovascular: Regular rate and rhythm, no murmurs, rubs, or gallops.  Abdomen: Normoactive bowel sounds. Soft, nondistended, nontender to palpation. No masses or hepatosplenomegaly appreciated. No palpable fluid wave.  Extremities: Grossly normal range of motion. Warm, well perfused. No edema bilaterally.  Skin: No rashes or lesions.  Lymphatics: No cervical, supraclavicular, or inguinal adenopathy.  GU:  Normal external female genitalia. No lesions. No discharge or bleeding.             Bladder/urethra:  No lesions or masses, well supported bladder             Vagina: Well rugated, no lesions or masses.             Cervix: LEEP bed is healing well after recent surgery.  Minimal blood at the apex of the vaginal vault.             Uterus: Small, mobile, no parametrial involvement or nodularity.             Adnexa: No masses appreciated.  Rectal: Deferred.  LABORATORY AND RADIOLOGIC DATA:  Outside medical records were reviewed to synthesize the above history, along with the history and physical obtained during the visit.   Lab Results  Component Value Date   WBC 6.5 06/15/2022   HGB 12.7 06/15/2022   HCT 37.6 06/15/2022   PLT 229 06/15/2022   GLUCOSE 97 06/15/2022   CHOL 229 (H) 09/24/2021   TRIG 150 (H) 09/24/2021   HDL 66 09/24/2021   LDLCALC 137 (H) 09/24/2021   ALT 12 09/24/2021   AST 17 09/24/2021   NA 138 06/15/2022   K 3.6 06/15/2022   CL 107 06/15/2022   CREATININE 1.13 (H) 06/15/2022   BUN 13 06/15/2022   CO2 25 06/15/2022   TSH 1.150 09/24/2021   INR 1.03 01/17/2016

## 2022-06-24 ENCOUNTER — Other Ambulatory Visit: Payer: Self-pay | Admitting: Gynecologic Oncology

## 2022-06-24 DIAGNOSIS — C541 Malignant neoplasm of endometrium: Secondary | ICD-10-CM

## 2022-06-25 NOTE — Progress Notes (Signed)
Patient here for new patient consultation with Dr. Jeral Pinch and for a pre-operative discussion prior to her scheduled surgery on July 30, 2022. She is scheduled for robotic assisted total laparoscopic hysterectomy (removal of the uterus and cervix), bilateral salpingo-oophorectomy, sentinel lymph node biopsy, possible lymph node dissection, possible laparotomy. The surgery was discussed in detail.  See after visit summary for additional details. Visual aids used to discuss items related to surgery including sequential compression stockings, foley catheter, IV pump, multi-modal pain regimen including tylenol, photo of the surgical robot, female reproductive system to discuss surgery in detail.      Discussed post-op pain management in detail including the aspects of the enhanced recovery pathway.  Advised her that a new prescription would be sent in for tramadol and it is only to be used for after her upcoming surgery.  We discussed the use of tylenol post-op and to monitor for a maximum of 4,000 mg in a 24 hour period.  Also prescribed sennakot to be used after surgery and to hold if having loose stools.  Discussed bowel regimen in detail.     Discussed the use of SCDs and measures to take at home to prevent DVT including frequent mobility.  Reportable signs and symptoms of DVT discussed. Post-operative instructions discussed and expectations for after surgery. Incisional care discussed as well including reportable signs and symptoms including erythema, drainage, wound separation.     5 minutes spent with the patient.  Verbalizing understanding of material discussed. No needs or concerns voiced at the end of the visit.   Advised patient and family to call for any needs.  Advised that her post-operative medications had been prescribed and could be picked up at any time.    This appointment is included in the global surgical bundle as pre-operative teaching and has no charge.

## 2022-06-25 NOTE — Patient Instructions (Signed)
Due to having the recent LEEP procedure on your cervix, we recommend scheduling surgery 6 weeks from this procedure to decrease risk of complications.   Preparing for your Surgery   Plan for surgery on July 30, 2022 with Dr. Jeral Pinch at [place] will be scheduled for robotic assisted total laparoscopic hysterectomy (removal of the uterus and cervix), bilateral salpingo-oophorectomy (removal of both ovaries and fallopian tubes), sentinel lymph node biopsy, possible lymph node dissection, possible laparotomy (larger incision on your abdomen if needed).     Pre-operative Testing -You will receive a phone call from presurgical testing at [facility] to arrange for a pre-operative appointment and lab work.   -Bring your insurance card, copy of an advanced directive if applicable, medication list   -At that visit, you will be asked to sign a consent for a possible blood transfusion in case a transfusion becomes necessary during surgery.  The need for a blood transfusion is rare but having consent is a necessary part of your care.      -You should not be taking blood thinners or aspirin at least ten days prior to surgery unless instructed by your surgeon.   -Do not take supplements such as fish oil (omega 3), red yeast rice, turmeric before your surgery. You want to avoid medications with aspirin in them including headache powders such as BC or Goody's), Excedrin migraine.   Day Before Surgery at [place] will be asked to take in a light diet the day before surgery. You will be advised you can have clear liquids up until 3 hours before your surgery.     Eat a light diet the day before surgery.  Examples including soups, broths, toast, yogurt, mashed potatoes.  AVOID GAS PRODUCING FOODS. Things to avoid include carbonated beverages (fizzy beverages, sodas), raw fruits and raw vegetables (uncooked), or beans.    If your bowels are filled with gas, your surgeon will  have difficulty visualizing your pelvic organs which increases your surgical risks.   Your role in recovery Your role is to become active as soon as directed by your doctor, while still giving yourself time to heal.  Rest when you feel tired. You will be asked to do the following in order to speed your recovery:   - Cough and breathe deeply. This helps to clear and expand your lungs and can prevent pneumonia after surgery.  - [place]. Do mild physical activity. Walking or moving your legs help your circulation and body functions return to normal. Do not try to get up or walk alone the first time after surgery.   -If you develop swelling on one leg or the other, pain in the back of your leg, redness/warmth in one of your legs, please call the office or go to the Emergency Room to have a doppler to rule out a blood clot. For shortness of breath, chest pain-seek care in the Emergency Room as soon as possible. - Actively manage your pain. Managing your pain lets you move in comfort. We will ask you to rate your pain on a scale of zero to 10. It is your responsibility to tell your doctor or nurse where and how much you hurt so your pain can be treated.   Special Considerations -If you are diabetic, you may be placed on insulin after surgery to have closer control over your blood sugars to promote healing and recovery.  This does not mean that you will  be discharged on insulin.  If applicable, your oral antidiabetics will be resumed when you are tolerating a solid diet.   -Your final pathology results from surgery should be available around one week after surgery and the results will be relayed to you when available.   -FMLA forms can be faxed to [phone] and please allow 5-7 business days for completion.   Pain Management After Surgery -You have been prescribed your pain medication and bowel regimen medications before surgery so that you can have these available when you are  discharged from the hospital. The pain medication is for use ONLY AFTER surgery and a new prescription will not be given.    -Make sure that you have Tylenol IF YOU ARE ABLE TO TAKE THESE MEDICATION at home to use on a regular basis after surgery for pain control.    -Review the attached handout on narcotic use and their risks and side effects.    Bowel Regimen -You have been prescribed Sennakot-S to take nightly to prevent constipation especially if you are taking the narcotic pain medication intermittently.  It is important to prevent constipation and drink adequate amounts of liquids. You can stop taking this medication when you are not taking pain medication and you are back on your normal bowel routine.   Risks of Surgery Risks of surgery are low but include bleeding, infection, damage to surrounding structures, re-operation, blood clots, and very rarely death.     Blood Transfusion Information (For the consent to be signed before surgery)   We will be checking your blood type before surgery so in case of emergencies, we will know what type of blood you would need.                                             WHAT IS A BLOOD TRANSFUSION?   A transfusion is the replacement of blood or some of its parts. Blood is made up of multiple cells which provide different functions. Red blood cells carry oxygen and are used for blood loss replacement. White blood cells fight against infection. Platelets control bleeding. Plasma helps clot blood. Other blood products are available for specialized needs, such as hemophilia or other clotting disorders. BEFORE THE TRANSFUSION  Who gives blood for transfusions?  You may be able to donate blood to be used at a later date on yourself (autologous donation). Relatives can be asked to donate blood. This is generally not any safer than if you have received blood from a stranger. The same precautions are taken to ensure safety when a relative's blood is  donated. Healthy volunteers who are fully evaluated to make sure their blood is safe. This is blood bank blood. Transfusion therapy is the safest it has ever been in the practice of medicine. Before blood is taken from a donor, a complete history is taken to make sure that person has no history of diseases nor engages in risky social behavior (examples are intravenous drug use or sexual activity with multiple partners). The donor's travel history is screened to minimize risk of transmitting infections, such as malaria. The donated blood is tested for signs of infectious diseases, such as HIV and hepatitis. The blood is then tested to be sure it is compatible with you in order to minimize the chance of a transfusion reaction. If you or a relative donates blood, this is often  done in anticipation of surgery and is not appropriate for emergency situations. It takes many days to process the donated blood. RISKS AND COMPLICATIONS Although transfusion therapy is very safe and saves many lives, the main dangers of transfusion include:  Getting an infectious disease. Developing a transfusion reaction. This is an allergic reaction to something in the blood you were given. Every precaution is taken to prevent this. The decision to have a blood transfusion has been considered carefully by your caregiver before blood is given. Blood is not given unless the benefits outweigh the risks.   AFTER SURGERY INSTRUCTIONS   Return to work: 4-6 weeks if applicable   Activity: 1. Be up and out of the bed during the day.  Take a nap if needed.  You may walk up steps but be careful and use the hand rail.  Stair climbing will tire you more than you think, you may need to stop part way and rest.    2. No lifting or straining for 6 weeks over 10 pounds. No pushing, pulling, straining for 6 weeks.   3. No driving for around 1 week(s).  Do not drive if you are taking narcotic pain medicine and make sure that your reaction time  has returned.    4. You can shower as soon as the next day after surgery. Shower daily.  Use your regular soap and water (not directly on the incision) and pat your incision(s) dry afterwards; don't rub.  No tub baths or submerging your body in water until cleared by your surgeon. If you have the soap that was given to you by pre-surgical testing that was used before surgery, you do not need to use it afterwards because this can irritate your incisions.    5. No sexual activity and nothing in the vagina for 8-10 weeks.   6. You may experience a small amount of clear drainage from your incisions, which is normal.  If the drainage persists, increases, or changes color please call the office.   7. Do not use creams, lotions, or ointments such as neosporin on your incisions after surgery until advised by your surgeon because they can cause removal of the dermabond glue on your incisions.     8. You may experience vaginal spotting after surgery or around the 6-8 week mark from surgery when the stitches at the top of the vagina begin to dissolve.  The spotting is normal but if you experience heavy bleeding, call our office.   9. Take Tylenol first for pain if you are able to take these medications and only use narcotic pain medication for severe pain not relieved by the Tylenol.  Monitor your Tylenol intake to a max of 4,000 mg in a 24 hour period.    Diet: 1. Low sodium Heart Healthy Diet is recommended but you are cleared to resume your normal (before surgery) diet after your procedure.   2. It is safe to use a laxative, such as Miralax or Colace, if you have difficulty moving your bowels. You have been prescribed Sennakot-S to take at bedtime every evening after surgery to keep bowel movements regular and to prevent constipation.     Wound Care: 1. Keep clean and dry.  Shower daily.   Reasons to call the Doctor: Fever - Oral temperature greater than 100.4 degrees Fahrenheit Foul-smelling vaginal  discharge Difficulty urinating Nausea and vomiting Increased pain at the site of the incision that is unrelieved with pain medicine. Difficulty breathing with or without  chest pain New calf pain especially if only on one side Sudden, continuing increased vaginal bleeding with or without clots.   Contacts: For questions or concerns you should contact:   Dr. Jeral Pinch at [phone]   Joylene John, NP at [phone]   After Hours: call [phone] and have the GYN Oncologist paged/contacted (after 5 pm or on the weekends).   Messages sent via mychart are for non-urgent matters and are not responded to after hours so for urgent needs, please call the after hours number.

## 2022-06-26 ENCOUNTER — Inpatient Hospital Stay: Payer: BC Managed Care – PPO | Admitting: Licensed Clinical Social Worker

## 2022-06-26 NOTE — Progress Notes (Signed)
[place] Work  Clinical Social Work was referred by new patient protocol for assessment of psychosocial needs.  Clinical Social Worker contacted patient by phone  to offer support and assess for needs.   Pt reports doing well so far, keeping a positive mindset. She is working and has good support from her partner.   CSW informed patient of the support team and support services at [facility].  CSW encouraged patient to call with any questions or concerns.     [place], [place] Worker [employer]

## 2022-06-29 ENCOUNTER — Ambulatory Visit (INDEPENDENT_AMBULATORY_CARE_PROVIDER_SITE_OTHER): Payer: BC Managed Care – PPO | Admitting: Obstetrics and Gynecology

## 2022-06-29 ENCOUNTER — Encounter: Payer: Self-pay | Admitting: Obstetrics and Gynecology

## 2022-06-29 VITALS — BP 118/78 | HR 67 | Resp 18

## 2022-06-29 DIAGNOSIS — C541 Malignant neoplasm of endometrium: Secondary | ICD-10-CM | POA: Diagnosis not present

## 2022-06-29 DIAGNOSIS — Z9889 Other specified postprocedural states: Secondary | ICD-10-CM

## 2022-07-03 ENCOUNTER — Encounter: Payer: Self-pay | Admitting: Gynecologic Oncology

## 2022-07-15 ENCOUNTER — Encounter (INDEPENDENT_AMBULATORY_CARE_PROVIDER_SITE_OTHER): Payer: Self-pay

## 2022-07-20 NOTE — Progress Notes (Signed)
COVID Vaccine Completed: yes  Date of COVID positive in last 90 days:  PCP - Rita Ohara, MD Cardiologist -   Chest x-ray -  EKG -  Stress Test -  ECHO -  Cardiac Cath -  Pacemaker/ICD device last checked: Spinal Cord Stimulator:  Bowel Prep - light diet the day before surgery  Sleep Study -  CPAP -   Fasting Blood Sugar -  Checks Blood Sugar _____ times a day  Last dose of GLP1 agonist-  N/A GLP1 instructions:  N/A   Last dose of SGLT-2 inhibitors-  N/A SGLT-2 instructions: N/A   Blood Thinner Instructions: Aspirin Instructions: Last Dose:  Activity level:  Can go up a flight of stairs and perform activities of daily living without stopping and without symptoms of chest pain or shortness of breath.  Able to exercise without symptoms  Unable to go up a flight of stairs without symptoms of     Anesthesia review:   Patient denies shortness of breath, fever, cough and chest pain at PAT appointment  Patient verbalized understanding of instructions that were given to them at the PAT appointment. Patient was also instructed that they will need to review over the PAT instructions again at home before surgery.

## 2022-07-20 NOTE — Patient Instructions (Signed)
SURGICAL WAITING ROOM VISITATION Patients having surgery or a procedure may have no more than 2 support people in the waiting area - these visitors may rotate.   Children under the age of 46 must have an adult with them who is not the patient. If the patient needs to stay at the hospital during part of their recovery, the visitor guidelines for inpatient rooms apply. Pre-op nurse will coordinate an appropriate time for 1 support person to accompany patient in pre-op.  This support person may not rotate.    Please refer to the [facility] website for the visitor guidelines for Inpatients (after your surgery is over and you are in a regular room).    Your procedure is scheduled on: 07/30/22   Report to [facility] Main Entrance    Report to admitting at 5:15 AM   Call this number if you have problems the morning of surgery [phone]   Do not eat food :After Midnight.   After Midnight you may have the following liquids until 4:30 AM DAY OF SURGERY  Water Non-Citrus Juices (without pulp, NO RED) Carbonated Beverages Black Coffee (NO MILK/CREAM OR CREAMERS, sugar ok)  Clear Tea (NO MILK/CREAM OR CREAMERS, sugar ok) regular and decaf                             Plain Jell-O (NO RED)                                           Fruit ices (not with fruit pulp, NO RED)                                     Popsicles (NO RED)                                                               Sports drinks like Gatorade (NO RED)          If you have questions, please contact your surgeon's office.   FOLLOW BOWEL PREP AND ANY ADDITIONAL PRE OP INSTRUCTIONS YOU RECEIVED FROM YOUR SURGEON'S OFFICE!!!     Oral Hygiene is also important to reduce your risk of infection.                                    Remember - BRUSH YOUR TEETH THE MORNING OF SURGERY WITH YOUR REGULAR TOOTHPASTE   Take these medicines the morning of surgery with A SIP OF WATER: Xanax, Bupropion, Pepcid                                You may not have any metal on your body including hair pins, jewelry, and body piercing             Do not wear make-up, lotions, powders, perfumes, or deodorant  Do not wear nail polish including gel and S&S, artificial/acrylic nails, or any other type  of covering on natural nails including finger and toenails. If you have artificial nails, gel coating, etc. that needs to be removed by a nail salon please have this removed prior to surgery or surgery may need to be canceled/ delayed if the surgeon/ anesthesia feels like they are unable to be safely monitored.   Do not shave  48 hours prior to surgery.    Do not bring valuables to the hospital. Brandi Carpenter.  DO NOT [place]. PHARMACY WILL DISPENSE MEDICATIONS LISTED ON YOUR MEDICATION LIST TO YOU DURING YOUR ADMISSION [place]!    Patients discharged on the day of surgery will not be allowed to drive home.  Someone NEEDS to stay with you for the first 24 hours after anesthesia.              Please read over the following fact sheets you were given: IF [place] [phone] Carpenter   If you received a COVID test during your pre-op visit  it is requested that you wear a mask when out in public, stay away from anyone that may not be feeling well and notify your surgeon if you develop symptoms. If you test positive for Covid or have been in contact with anyone that has tested positive in the last 10 days please notify you surgeon.    [place] - Preparing for Surgery Before surgery, you can play an important role.  Because skin is not sterile, your skin needs to be as free of germs as possible.  You can reduce the number of germs on your skin by washing with CHG (chlorahexidine gluconate) soap before surgery.  CHG is an antiseptic cleaner which kills germs and bonds with the skin to continue  killing germs even after washing. Please DO NOT use if you have an allergy to CHG or antibacterial soaps.  If your skin becomes reddened/irritated stop using the CHG and inform your nurse when you arrive at Short Stay. Do not shave (including legs and underarms) for at least 48 hours prior to the first CHG shower.  You may shave your face/neck.  Please follow these instructions carefully:  1.  Shower with CHG Soap the night before surgery and the  morning of surgery.  2.  If you choose to wash your hair, wash your hair first as usual with your normal  shampoo.  3.  After you shampoo, rinse your hair and body thoroughly to remove the shampoo.                             4.  Use CHG as you would any other liquid soap.  You can apply chg directly to the skin and wash.  Gently with a scrungie or clean washcloth.  5.  Apply the CHG Soap to your body ONLY FROM THE NECK DOWN.   Do   not use on face/ open                           Wound or open sores. Avoid contact with eyes, ears mouth and   genitals (private parts).                       Wash  face,  Genitals (private parts) with your normal soap.             6.  Wash thoroughly, paying special attention to the area where your    surgery  will be performed.  7.  Thoroughly rinse your body with warm water from the neck down.  8.  DO NOT shower/wash with your normal soap after using and rinsing off the CHG Soap.                9.  Pat yourself dry with a clean towel.            10.  Wear clean pajamas.            11.  Place clean sheets on your bed the night of your first shower and do not  sleep with pets. Day of Surgery : Do not apply any lotions/deodorants the morning of surgery.  Please wear clean clothes to the hospital/surgery center.  FAILURE TO FOLLOW THESE INSTRUCTIONS MAY RESULT IN THE CANCELLATION OF YOUR SURGERY  PATIENT SIGNATURE_________________________________  NURSE  SIGNATURE__________________________________  ________________________________________________________________________  Brandi Carpenter  An incentive spirometer is a tool that can help keep your lungs clear and active. This tool measures how well you are filling your lungs with each breath. Taking long deep breaths may help reverse or decrease the chance of developing breathing (pulmonary) problems (especially infection) following: A long period of time when you are unable to move or be active. BEFORE THE PROCEDURE  If the spirometer includes an indicator to show your best effort, your nurse or respiratory therapist will set it to a desired goal. If possible, sit up straight or lean slightly forward. Try not to slouch. Hold the incentive spirometer in an upright position. INSTRUCTIONS FOR USE  Sit on the edge of your bed if possible, or sit up as far as you can in bed or on a chair. Hold the incentive spirometer in an upright position. Breathe out normally. Place the mouthpiece in your mouth and seal your lips tightly around it. Breathe in slowly and as deeply as possible, raising the piston or the ball toward the top of the column. Hold your breath for 3-5 seconds or for as long as possible. Allow the piston or ball to fall to the bottom of the column. Remove the mouthpiece from your mouth and breathe out normally. Rest for a few seconds and repeat Steps 1 through 7 at least 10 times every 1-2 hours when you are awake. Take your time and take a few normal breaths between deep breaths. The spirometer may include an indicator to show your best effort. Use the indicator as a goal to work toward during each repetition. After each set of 10 deep breaths, practice coughing to be sure your lungs are clear. If you have an incision (the cut made at the time of surgery), support your incision when coughing by placing a pillow or rolled up towels firmly against it. Once you are able to get out of  bed, walk around indoors and cough well. You may stop using the incentive spirometer when instructed by your caregiver.  RISKS AND COMPLICATIONS Take your time so you do not get dizzy or light-headed. If you are in pain, you may need to take or ask for pain medication before doing incentive spirometry. It is harder to take a deep breath if you are having pain. AFTER USE Rest and breathe slowly and easily. It can be helpful to keep  track of a log of your progress. Your caregiver can provide you with a simple table to help with this. If you are using the spirometer at home, follow these instructions: [place] IF:  You are having difficultly using the spirometer. You have trouble using the spirometer as often as instructed. Your pain medication is not giving enough relief while using the spirometer. You develop fever of 100.5 F (38.1 C) or higher. SEEK IMMEDIATE MEDICAL CARE IF:  You cough up bloody sputum that had not been present before. You develop fever of 102 F (38.9 C) or greater. You develop worsening pain at or near the incision site. MAKE SURE YOU:  Understand these instructions. Will watch your condition. Will get help right away if you are not doing well or get worse. Document Released: 01/11/2007 Document Revised: 11/23/2011 Document Reviewed: 03/14/2007 Exit[employer] Patient Information 2014 [employer], [place].   ________________________________________________________________________ WHAT IS A BLOOD TRANSFUSION? Blood Transfusion Information  A transfusion is the replacement of blood or some of its parts. Blood is made up of multiple cells which provide different functions. Red blood cells carry oxygen and are used for blood loss replacement. White blood cells fight against infection. Platelets control bleeding. Plasma helps clot blood. Other blood products are available for specialized needs, such as hemophilia or other clotting disorders. BEFORE THE TRANSFUSION   Who gives blood for transfusions?  Healthy volunteers who are fully evaluated to make sure their blood is safe. This is blood bank blood. Transfusion therapy is the safest it has ever been in the practice of medicine. Before blood is taken from a donor, a complete history is taken to make sure that person has no history of diseases nor engages in risky social behavior (examples are intravenous drug use or sexual activity with multiple partners). The donor's travel history is screened to minimize risk of transmitting infections, such as malaria. The donated blood is tested for signs of infectious diseases, such as HIV and hepatitis. The blood is then tested to be sure it is compatible with you in order to minimize the chance of a transfusion reaction. If you or a relative donates blood, this is often done in anticipation of surgery and is not appropriate for emergency situations. It takes many days to process the donated blood. RISKS AND COMPLICATIONS Although transfusion therapy is very safe and saves many lives, the main dangers of transfusion include:  Getting an infectious disease. Developing a transfusion reaction. This is an allergic reaction to something in the blood you were given. Every precaution is taken to prevent this. The decision to have a blood transfusion has been considered carefully by your caregiver before blood is given. Blood is not given unless the benefits outweigh the risks. AFTER THE TRANSFUSION Right after receiving a blood transfusion, you will usually feel much better and more energetic. This is especially true if your red blood cells have gotten low (anemic). The transfusion raises the level of the red blood cells which carry oxygen, and this usually causes an energy increase. The nurse administering the transfusion will monitor you carefully for complications. HOME CARE INSTRUCTIONS  No special instructions are needed after a transfusion. You may find your energy is  better. Speak with your caregiver about any limitations on activity for underlying diseases you may have. SEEK MEDICAL CARE IF:  Your condition is not improving after your transfusion. You develop redness or irritation at the intravenous (IV) site. SEEK IMMEDIATE MEDICAL CARE IF:  Any of the following symptoms  occur over the next 12 hours: Shaking chills. You have a temperature by mouth above 102 F (38.9 C), not controlled by medicine. Chest, back, or muscle pain. People around you feel you are not acting correctly or are confused. Shortness of breath or difficulty breathing. Dizziness and fainting. You get a rash or develop hives. You have a decrease in urine output. Your urine turns a dark color or changes to pink, red, or brown. Any of the following symptoms occur over the next 10 days: You have a temperature by mouth above 102 F (38.9 C), not controlled by medicine. Shortness of breath. Weakness after normal activity. The white part of the eye turns yellow (jaundice). You have a decrease in the amount of urine or are urinating less often. Your urine turns a dark color or changes to pink, red, or brown. Document Released: 08/28/2000 Document Revised: 11/23/2011 Document Reviewed: 04/16/2008 [facility] Patient Information 2014 [place], [place].  _______________________________________________________________________

## 2022-07-21 ENCOUNTER — Encounter (HOSPITAL_COMMUNITY)
Admission: RE | Admit: 2022-07-21 | Discharge: 2022-07-21 | Disposition: A | Discharge status: Home / Self Care | Payer: BC Managed Care – PPO | Source: Ambulatory Visit | Attending: Gynecologic Oncology | Admitting: Gynecologic Oncology

## 2022-07-21 ENCOUNTER — Encounter (HOSPITAL_COMMUNITY): Payer: Self-pay

## 2022-07-21 DIAGNOSIS — C541 Malignant neoplasm of endometrium: Secondary | ICD-10-CM | POA: Diagnosis not present

## 2022-07-21 DIAGNOSIS — Z01818 Encounter for other preprocedural examination: Secondary | ICD-10-CM | POA: Diagnosis present

## 2022-07-21 LAB — CBC
HCT: 39.6 % (ref 36.0–46.0)
Hemoglobin: 13.3 g/dL (ref 12.0–15.0)
MCH: 30.2 pg (ref 26.0–34.0)
MCHC: 33.6 g/dL (ref 30.0–36.0)
MCV: 89.8 fL (ref 80.0–100.0)
Platelets: 256 10*3/uL (ref 150–400)
RBC: 4.41 MIL/uL (ref 3.87–5.11)
RDW: 12.8 % (ref 11.5–15.5)
WBC: 7.2 10*3/uL (ref 4.0–10.5)
nRBC: 0 % (ref 0.0–0.2)

## 2022-07-21 LAB — COMPREHENSIVE METABOLIC PANEL
ALT: 31 U/L (ref 0–44)
AST: 24 U/L (ref 15–41)
Albumin: 4.3 g/dL (ref 3.5–5.0)
Alkaline Phosphatase: 72 U/L (ref 38–126)
Anion gap: 9 (ref 5–15)
BUN: 11 mg/dL (ref 6–20)
CO2: 29 mmol/L (ref 22–32)
Calcium: 9.9 mg/dL (ref 8.9–10.3)
Chloride: 104 mmol/L (ref 98–111)
Creatinine, Ser: 0.96 mg/dL (ref 0.44–1.00)
GFR, Estimated: >60 mL/min (ref >60)
Glucose, Bld: 94 mg/dL (ref 70–99)
Potassium: 4.2 mmol/L (ref 3.5–5.1)
Sodium: 142 mmol/L (ref 135–145)
Total Bilirubin: 0.9 mg/dL (ref 0.3–1.2)
Total Protein: 7.3 g/dL (ref 6.5–8.1)

## 2022-07-27 ENCOUNTER — Telehealth: Payer: Self-pay

## 2022-07-27 NOTE — Telephone Encounter (Signed)
Faxed FMLA to [phone] per paperwork.

## 2022-07-28 ENCOUNTER — Other Ambulatory Visit: Payer: Self-pay | Admitting: Family Medicine

## 2022-07-28 DIAGNOSIS — G4700 Insomnia, unspecified: Secondary | ICD-10-CM

## 2022-07-29 ENCOUNTER — Telehealth: Payer: Self-pay

## 2022-07-29 NOTE — Telephone Encounter (Signed)
Telephone call to check on pre-operative status.  Patient compliant with pre-operative instructions.  Reinforced nothing to eat after midnight. Clear liquids until 0430. Patient to arrive at 0515.  No questions or concerns voiced.  Instructed to call for any needs.  ?

## 2022-07-29 NOTE — Anesthesia Preprocedure Evaluation (Signed)
Anesthesia Evaluation  Patient identified by MRN, date of birth, ID band Patient awake    Reviewed: Allergy & Precautions, NPO status , Patient's Chart, lab work & pertinent test results  History of Anesthesia Complications Negative for: history of anesthetic complications  Airway Mallampati: II  TM Distance: >3 FB Neck ROM: Full    Dental  (+) Dental Advisory Given   Pulmonary neg pulmonary ROS   Pulmonary exam normal        Cardiovascular negative cardio ROS Normal cardiovascular exam     Neuro/Psych   Anxiety Depression    H/o cervical fusion    GI/Hepatic Neg liver ROS,GERD  ,,  Endo/Other  negative endocrine ROS    Renal/GU negative Renal ROS  negative genitourinary   Musculoskeletal negative musculoskeletal ROS (+)    Abdominal   Peds  Hematology negative hematology ROS (+)   Anesthesia Other Findings   Reproductive/Obstetrics Endometrial cancer                             Anesthesia Physical Anesthesia Plan  ASA: 2  Anesthesia Plan: General   Post-op Pain Management: Tylenol PO (pre-op)* and Toradol IV (intra-op)*   Induction: Intravenous  PONV Risk Score and Plan: 4 or greater and Ondansetron, Dexamethasone, Treatment may vary due to age or medical condition and Midazolam  Airway Management Planned: Oral ETT  Additional Equipment: None  Intra-op Plan:   Post-operative Plan: Extubation in OR  Informed Consent: I have reviewed the patients History and Physical, chart, labs and discussed the procedure including the risks, benefits and alternatives for the proposed anesthesia with the patient or authorized representative who has indicated his/her understanding and acceptance.     Dental advisory given  Plan Discussed with:   Anesthesia Plan Comments:        Anesthesia Quick Evaluation

## 2022-07-29 NOTE — Telephone Encounter (Signed)
Is thi okay to refill?

## 2022-07-30 ENCOUNTER — Ambulatory Visit (HOSPITAL_COMMUNITY): Payer: BC Managed Care – PPO | Admitting: Anesthesiology

## 2022-07-30 ENCOUNTER — Ambulatory Visit (HOSPITAL_COMMUNITY)
Admission: RE | Admit: 2022-07-30 | Discharge: 2022-07-30 | Disposition: A | Discharge status: Home / Self Care | Payer: BC Managed Care – PPO | Source: Ambulatory Visit | Attending: Gynecologic Oncology | Admitting: Gynecologic Oncology

## 2022-07-30 ENCOUNTER — Encounter (HOSPITAL_COMMUNITY)
Admission: RE | Disposition: A | Discharge status: Home / Self Care | Payer: Self-pay | Source: Ambulatory Visit | Attending: Gynecologic Oncology

## 2022-07-30 ENCOUNTER — Other Ambulatory Visit: Payer: Self-pay

## 2022-07-30 ENCOUNTER — Encounter (HOSPITAL_COMMUNITY): Payer: Self-pay | Admitting: Gynecologic Oncology

## 2022-07-30 DIAGNOSIS — C541 Malignant neoplasm of endometrium: Secondary | ICD-10-CM | POA: Diagnosis present

## 2022-07-30 DIAGNOSIS — N8003 Adenomyosis of the uterus: Secondary | ICD-10-CM | POA: Diagnosis not present

## 2022-07-30 DIAGNOSIS — D251 Intramural leiomyoma of uterus: Secondary | ICD-10-CM | POA: Diagnosis not present

## 2022-07-30 HISTORY — PX: ROBOTIC ASSISTED TOTAL HYSTERECTOMY WITH BILATERAL SALPINGO OOPHERECTOMY: SHX6086

## 2022-07-30 HISTORY — PX: SENTINEL NODE BIOPSY: SHX6608

## 2022-07-30 LAB — TYPE AND SCREEN
ABO/RH(D): O POS
Antibody Screen: NEGATIVE

## 2022-07-30 LAB — ABO/RH: ABO/RH(D): O POS

## 2022-07-30 SURGERY — HYSTERECTOMY, TOTAL, ROBOT-ASSISTED, LAPAROSCOPIC, WITH BILATERAL SALPINGO-OOPHORECTOMY
Anesthesia: General

## 2022-07-30 MED ORDER — STERILE WATER FOR INJECTION IJ SOLN
INTRAMUSCULAR | Status: DC | PRN
  Administered 2022-07-30: 10 mL

## 2022-07-30 MED ORDER — LIDOCAINE HCL (PF) 2 % IJ SOLN
INTRAMUSCULAR | Status: DC | PRN
  Administered 2022-07-30: 1.5 mg/kg/h via INTRADERMAL

## 2022-07-30 MED ORDER — SUGAMMADEX SODIUM 200 MG/2ML IV SOLN
INTRAVENOUS | Status: DC | PRN
  Administered 2022-07-30: 200 mg via INTRAVENOUS

## 2022-07-30 MED ORDER — ROCURONIUM BROMIDE 10 MG/ML (PF) SYRINGE
PREFILLED_SYRINGE | INTRAVENOUS | Status: DC | PRN
  Administered 2022-07-30: 100 mg via INTRAVENOUS

## 2022-07-30 MED ORDER — ACETAMINOPHEN 500 MG PO TABS: 1000.0000 mg | ORAL_TABLET | Freq: Once | ORAL | Status: DC

## 2022-07-30 MED ORDER — OXYCODONE HCL 5 MG/5ML PO SOLN: 5.0000 mg | Freq: Once | ORAL | Status: AC | PRN

## 2022-07-30 MED ORDER — FENTANYL CITRATE PF 50 MCG/ML IJ SOSY: 25.0000 ug | PREFILLED_SYRINGE | INTRAMUSCULAR | Status: DC | PRN

## 2022-07-30 MED ORDER — ROCURONIUM BROMIDE 10 MG/ML (PF) SYRINGE
PREFILLED_SYRINGE | INTRAVENOUS | Status: AC
  Filled 2022-07-30: qty 10

## 2022-07-30 MED ORDER — PROPOFOL 10 MG/ML IV BOLUS
INTRAVENOUS | Status: DC | PRN
  Administered 2022-07-30: 150 mg via INTRAVENOUS

## 2022-07-30 MED ORDER — HEPARIN SODIUM (PORCINE) 5000 UNIT/ML IJ SOLN
5000.0000 [IU] | INTRAMUSCULAR | Status: AC
  Administered 2022-07-30: 5000 [IU] via SUBCUTANEOUS
  Filled 2022-07-30: qty 1

## 2022-07-30 MED ORDER — FENTANYL CITRATE (PF) 100 MCG/2ML IJ SOLN
INTRAMUSCULAR | Status: DC | PRN
  Administered 2022-07-30: 100 ug via INTRAVENOUS
  Administered 2022-07-30: 50 ug via INTRAVENOUS

## 2022-07-30 MED ORDER — ONDANSETRON HCL 4 MG/2ML IJ SOLN
INTRAMUSCULAR | Status: AC
  Filled 2022-07-30: qty 2

## 2022-07-30 MED ORDER — PROPOFOL 10 MG/ML IV BOLUS
INTRAVENOUS | Status: AC
  Filled 2022-07-30: qty 20

## 2022-07-30 MED ORDER — MIDAZOLAM HCL 2 MG/2ML IJ SOLN
INTRAMUSCULAR | Status: AC
  Filled 2022-07-30: qty 2

## 2022-07-30 MED ORDER — OXYCODONE HCL 5 MG PO TABS
5.0000 mg | ORAL_TABLET | Freq: Once | ORAL | Status: AC | PRN
  Administered 2022-07-30: 5 mg via ORAL

## 2022-07-30 MED ORDER — KETAMINE HCL 10 MG/ML IJ SOLN
INTRAMUSCULAR | Status: AC
  Filled 2022-07-30: qty 1

## 2022-07-30 MED ORDER — ACETAMINOPHEN 500 MG PO TABS
1000.0000 mg | ORAL_TABLET | ORAL | Status: AC
  Administered 2022-07-30: 1000 mg via ORAL
  Filled 2022-07-30: qty 2

## 2022-07-30 MED ORDER — EPHEDRINE SULFATE-NACL 50-0.9 MG/10ML-% IV SOSY
PREFILLED_SYRINGE | INTRAVENOUS | Status: DC | PRN
  Administered 2022-07-30 (×2): 10 mg via INTRAVENOUS

## 2022-07-30 MED ORDER — LACTATED RINGERS IR SOLN
Status: DC | PRN
  Administered 2022-07-30: 1000 mL

## 2022-07-30 MED ORDER — EPHEDRINE 5 MG/ML INJ
INTRAVENOUS | Status: AC
  Filled 2022-07-30: qty 5

## 2022-07-30 MED ORDER — MIDAZOLAM HCL 5 MG/5ML IJ SOLN
INTRAMUSCULAR | Status: DC | PRN
  Administered 2022-07-30: 2 mg via INTRAVENOUS

## 2022-07-30 MED ORDER — LACTATED RINGERS IV SOLN: INTRAVENOUS | Status: DC | PRN

## 2022-07-30 MED ORDER — FENTANYL CITRATE (PF) 250 MCG/5ML IJ SOLN
INTRAMUSCULAR | Status: AC
  Filled 2022-07-30: qty 5

## 2022-07-30 MED ORDER — ONDANSETRON HCL 4 MG/2ML IJ SOLN: 4.0000 mg | Freq: Once | INTRAMUSCULAR | Status: DC | PRN

## 2022-07-30 MED ORDER — KETAMINE HCL 10 MG/ML IJ SOLN
INTRAMUSCULAR | Status: DC | PRN
  Administered 2022-07-30: 20 mg via INTRAVENOUS
  Administered 2022-07-30: 30 mg via INTRAVENOUS

## 2022-07-30 MED ORDER — DEXAMETHASONE SODIUM PHOSPHATE 10 MG/ML IJ SOLN
INTRAMUSCULAR | Status: AC
  Filled 2022-07-30: qty 1

## 2022-07-30 MED ORDER — BUPIVACAINE HCL 0.25 % IJ SOLN
INTRAMUSCULAR | Status: DC | PRN
  Administered 2022-07-30: 30 mL

## 2022-07-30 MED ORDER — HYDROCODONE-ACETAMINOPHEN 5-325 MG PO TABS
ORAL_TABLET | ORAL | Status: AC
  Filled 2022-07-30: qty 2

## 2022-07-30 MED ORDER — CHLORHEXIDINE GLUCONATE 0.12 % MT SOLN
15.0000 mL | Freq: Once | OROMUCOSAL | Status: AC
  Administered 2022-07-30: 15 mL via OROMUCOSAL

## 2022-07-30 MED ORDER — ORAL CARE MOUTH RINSE: 15.0000 mL | Freq: Once | OROMUCOSAL | Status: AC

## 2022-07-30 MED ORDER — STERILE WATER FOR INJECTION IJ SOLN
INTRAMUSCULAR | Status: DC | PRN
  Administered 2022-07-30: 4 mL

## 2022-07-30 MED ORDER — LIDOCAINE 2% (20 MG/ML) 5 ML SYRINGE
INTRAMUSCULAR | Status: DC | PRN
  Administered 2022-07-30: 100 mg via INTRAVENOUS

## 2022-07-30 MED ORDER — BUPIVACAINE HCL 0.25 % IJ SOLN
INTRAMUSCULAR | Status: AC
  Filled 2022-07-30: qty 1

## 2022-07-30 MED ORDER — AMISULPRIDE (ANTIEMETIC) 5 MG/2ML IV SOLN
10.0000 mg | Freq: Once | INTRAVENOUS | Status: AC | PRN
  Administered 2022-07-30: 10 mg via INTRAVENOUS

## 2022-07-30 MED ORDER — AMISULPRIDE (ANTIEMETIC) 5 MG/2ML IV SOLN
INTRAVENOUS | Status: AC
  Filled 2022-07-30: qty 4

## 2022-07-30 MED ORDER — SCOPOLAMINE 1 MG/3DAYS TD PT72
1.0000 | MEDICATED_PATCH | TRANSDERMAL | Status: DC
  Administered 2022-07-30: 1.5 mg via TRANSDERMAL
  Filled 2022-07-30: qty 1

## 2022-07-30 MED ORDER — LACTATED RINGERS IV SOLN: INTRAVENOUS | Status: DC

## 2022-07-30 MED ORDER — STERILE WATER FOR INJECTION IJ SOLN
INTRAMUSCULAR | Status: AC
  Filled 2022-07-30: qty 10

## 2022-07-30 MED ORDER — OXYCODONE HCL 5 MG PO TABS
ORAL_TABLET | ORAL | Status: AC
  Filled 2022-07-30: qty 1

## 2022-07-30 MED ORDER — ONDANSETRON HCL 4 MG/2ML IJ SOLN
INTRAMUSCULAR | Status: DC | PRN
  Administered 2022-07-30: 4 mg via INTRAVENOUS

## 2022-07-30 MED ORDER — DEXAMETHASONE SODIUM PHOSPHATE 10 MG/ML IJ SOLN
INTRAMUSCULAR | Status: DC | PRN
  Administered 2022-07-30: 10 mg via INTRAVENOUS

## 2022-07-30 MED ORDER — ONDANSETRON HCL 4 MG PO TABS: 4.0000 mg | ORAL_TABLET | Freq: Four times a day (QID) | ORAL | Status: DC | PRN

## 2022-07-30 MED ORDER — TRAMADOL HCL 50 MG PO TABS: 50.0000 mg | ORAL_TABLET | Freq: Four times a day (QID) | ORAL | Status: DC | PRN

## 2022-07-30 MED ORDER — HYDROCODONE-ACETAMINOPHEN 5-325 MG PO TABS
1.0000 | ORAL_TABLET | ORAL | Status: DC | PRN
  Administered 2022-07-30: 2 via ORAL

## 2022-07-30 MED ORDER — ONDANSETRON HCL 4 MG/2ML IJ SOLN: 4.0000 mg | Freq: Four times a day (QID) | INTRAMUSCULAR | Status: DC | PRN

## 2022-07-30 MED ORDER — LIDOCAINE HCL 2 % IJ SOLN
INTRAMUSCULAR | Status: AC
  Filled 2022-07-30: qty 20

## 2022-07-30 MED ORDER — DEXAMETHASONE SODIUM PHOSPHATE 4 MG/ML IJ SOLN: 4.0000 mg | INTRAMUSCULAR | Status: DC

## 2022-07-30 MED ORDER — CEFAZOLIN SODIUM-DEXTROSE 2-4 GM/100ML-% IV SOLN
2.0000 g | INTRAVENOUS | Status: AC
  Administered 2022-07-30: 2 g via INTRAVENOUS
  Filled 2022-07-30: qty 100

## 2022-07-30 MED ORDER — LIDOCAINE HCL (PF) 2 % IJ SOLN
INTRAMUSCULAR | Status: AC
  Filled 2022-07-30: qty 5

## 2022-07-30 SURGICAL SUPPLY — 74 items
ADH SKN CLS APL DERMABOND .7 (GAUZE/BANDAGES/DRESSINGS) ×2
AGENT HMST KT MTR STRL THRMB (HEMOSTASIS)
APL ESCP 34 STRL LF DISP (HEMOSTASIS)
APPLICATOR SURGIFLO ENDO (HEMOSTASIS) IMPLANT
BAG LAPAROSCOPIC 12 15 PORT 16 (BASKET) IMPLANT
BAG RETRIEVAL 12/15 (BASKET)
BLADE SURG SZ10 CARB STEEL (BLADE) IMPLANT
COVER BACK TABLE 60X90IN (DRAPES) ×3 IMPLANT
COVER TIP SHEARS 8 DVNC (MISCELLANEOUS) ×3 IMPLANT
COVER TIP SHEARS 8MM DA VINCI (MISCELLANEOUS) ×2
DERMABOND ADVANCED .7 DNX12 (GAUZE/BANDAGES/DRESSINGS) ×3 IMPLANT
DRAPE ARM DVNC X/XI (DISPOSABLE) ×12 IMPLANT
DRAPE COLUMN DVNC XI (DISPOSABLE) ×3 IMPLANT
DRAPE DA VINCI XI ARM (DISPOSABLE) ×8
DRAPE DA VINCI XI COLUMN (DISPOSABLE) ×2
DRAPE SHEET LG 3/4 BI-LAMINATE (DRAPES) ×3 IMPLANT
DRAPE SURG IRRIG POUCH 19X23 (DRAPES) ×3 IMPLANT
DRSG OPSITE POSTOP 4X6 (GAUZE/BANDAGES/DRESSINGS) IMPLANT
DRSG OPSITE POSTOP 4X8 (GAUZE/BANDAGES/DRESSINGS) IMPLANT
ELECT PENCIL ROCKER SW 15FT (MISCELLANEOUS) IMPLANT
ELECT REM PT RETURN 15FT ADLT (MISCELLANEOUS) ×3 IMPLANT
GAUZE 4X4 16PLY ~~LOC~~+RFID DBL (SPONGE) ×6 IMPLANT
GLOVE BIO SURGEON STRL SZ 6 (GLOVE) ×12 IMPLANT
GLOVE BIO SURGEON STRL SZ 6.5 (GLOVE) ×3 IMPLANT
GOWN STRL REUS W/ TWL LRG LVL3 (GOWN DISPOSABLE) ×12 IMPLANT
GOWN STRL REUS W/TWL LRG LVL3 (GOWN DISPOSABLE) ×8
GRASPER SUT TROCAR 14GX15 (MISCELLANEOUS) IMPLANT
HOLDER FOLEY CATH W/STRAP (MISCELLANEOUS) IMPLANT
IRRIG SUCT STRYKERFLOW 2 WTIP (MISCELLANEOUS) ×2
IRRIGATION SUCT STRKRFLW 2 WTP (MISCELLANEOUS) ×3 IMPLANT
KIT PROCEDURE DA VINCI SI (MISCELLANEOUS) ×2
KIT PROCEDURE DVNC SI (MISCELLANEOUS) IMPLANT
KIT TURNOVER KIT A (KITS) IMPLANT
LIGASURE IMPACT 36 18CM CVD LR (INSTRUMENTS) IMPLANT
MANIPULATOR ADVINCU DEL 3.0 PL (MISCELLANEOUS) IMPLANT
MANIPULATOR ADVINCU DEL 3.5 PL (MISCELLANEOUS) IMPLANT
MANIPULATOR UTERINE 4.5 ZUMI (MISCELLANEOUS) IMPLANT
NDL HYPO 21X1.5 SAFETY (NEEDLE) ×3 IMPLANT
NDL SPNL 18GX3.5 QUINCKE PK (NEEDLE) IMPLANT
NEEDLE HYPO 21X1.5 SAFETY (NEEDLE) ×2 IMPLANT
NEEDLE SPNL 18GX3.5 QUINCKE PK (NEEDLE) IMPLANT
OBTURATOR OPTICAL STANDARD 8MM (TROCAR) ×2
OBTURATOR OPTICAL STND 8 DVNC (TROCAR) ×2
OBTURATOR OPTICALSTD 8 DVNC (TROCAR) ×3 IMPLANT
PACK ROBOT GYN CUSTOM WL (TRAY / TRAY PROCEDURE) ×3 IMPLANT
PAD POSITIONING PINK XL (MISCELLANEOUS) ×3 IMPLANT
PORT ACCESS TROCAR AIRSEAL 12 (TROCAR) ×3 IMPLANT
SEAL CANN UNIV 5-8 DVNC XI (MISCELLANEOUS) ×12 IMPLANT
SEAL XI 5MM-8MM UNIVERSAL (MISCELLANEOUS) ×8
SET TRI-LUMEN FLTR TB AIRSEAL (TUBING) ×3 IMPLANT
SOL PREP POV-IOD 4OZ 10% (MISCELLANEOUS) ×6 IMPLANT
SPIKE FLUID TRANSFER (MISCELLANEOUS) ×3 IMPLANT
SPONGE T-LAP 18X18 ~~LOC~~+RFID (SPONGE) IMPLANT
SURGIFLO W/THROMBIN 8M KIT (HEMOSTASIS) IMPLANT
SUT MNCRL AB 4-0 PS2 18 (SUTURE) IMPLANT
SUT PDS AB 1 TP1 96 (SUTURE) IMPLANT
SUT VIC AB 0 CT1 27 (SUTURE)
SUT VIC AB 0 CT1 27XBRD ANTBC (SUTURE) IMPLANT
SUT VIC AB 2-0 CT1 27 (SUTURE)
SUT VIC AB 2-0 CT1 TAPERPNT 27 (SUTURE) IMPLANT
SUT VIC AB 4-0 PS2 18 (SUTURE) ×6 IMPLANT
SUT VLOC 180 0 9IN  GS21 (SUTURE)
SUT VLOC 180 0 9IN GS21 (SUTURE) IMPLANT
SYR 10ML LL (SYRINGE) IMPLANT
SYS BAG RETRIEVAL 10MM (BASKET)
SYS WOUND ALEXIS 18CM MED (MISCELLANEOUS)
SYSTEM BAG RETRIEVAL 10MM (BASKET) IMPLANT
SYSTEM WOUND ALEXIS 18CM MED (MISCELLANEOUS) IMPLANT
TOWEL OR NON WOVEN STRL DISP B (DISPOSABLE) IMPLANT
TRAP SPECIMEN MUCUS 40CC (MISCELLANEOUS) IMPLANT
TRAY FOLEY MTR SLVR 16FR STAT (SET/KITS/TRAYS/PACK) ×3 IMPLANT
UNDERPAD 30X36 HEAVY ABSORB (UNDERPADS AND DIAPERS) ×6 IMPLANT
WATER STERILE IRR 1000ML POUR (IV SOLUTION) ×3 IMPLANT
YANKAUER SUCT BULB TIP 10FT TU (MISCELLANEOUS) IMPLANT

## 2022-07-30 NOTE — H&P (Signed)
Gynecologic Oncology H&P  07/20/22  Treatment History: Patient has a history of high-grade cervical dysplasia status post conization procedure with negative margins (early 2000s).  She endorses having mostly normal Pap smears since that time with occasional mild abnormality seen.   In 2021, the patient had postmenopausal bleeding, ultrasound and sonohysterogram showed likely endometrial polyp.  Endometrial biopsy revealed endometrioid type polyp, no malignancy.   In 2022, she presented with postmenopausal bleeding in the setting of being on hormone replacement therapy.  Pap test showed atypical glandular cells of undetermined significance.  Sonohysterogram showed a 19 mm endometrial mass and endometrial biopsy revealed a benign polyp.  On 05/13/2021, patient underwent hysteroscopy with D&C, endocervical curettage and resection of endometrial polyps.  Pathology from this revealed benign endometrial polyp, no hyperplasia or malignancy.  ECC showed benign endocervical glandular epithelium.   Pap test on 02/11/2022 showed atypical glandular cells, not otherwise specified.  High risk HPV negative.   An abnormal Pap test, colposcopy was performed with biopsies including endocervical curettage and endometrial biopsy on 03/06/2022.  This showed fragments of normal endocervical tissue mixed with few strips of metaplastic squamous epithelium.  Endometrial biopsy revealed fragments of benign endometrial surface epithelium with fragments of normal endocervical epithelium.  No hyperplasia, malignancy, polyps, or endometritis.   Pelvic ultrasound exam on 04/07/2022 revealed a uterus measuring 7.5 x 4.3 x 3.8 cm.  Endometrial lining measures 6.5 mm with an echogenic area and possible mass measuring up to 1.1 cm in the central fundal region.  Bilateral ovaries normal in appearance and atrophic.  No adnexal masses or free fluid.   Sonohysterogram was performed on 05/14/2022 revealing 3 filling defects of the  endometrium.   Patient was taken to the operating room on 06/15/2022 for colposcopy with LEEP, ECC, hysteroscopy with MyoSure resection of endometrial polyps and D&C.  Pathology revealed focal endometrioid adenocarcinoma, FIGO grade 1 involving fragments of an endometrial polyp from the polypectomy.  D&C showed fragments of benign endometrial surface epithelium admixed with fragments of ecto and endocervical epithelium, no hyperplasia or malignancy.  Endocervical curettage showed fragments of benign endocervical epithelium, no malignancy.  LEEP showed L SIL involving ectocervical margin and abutting the endocervical margin from 12-3 o'clock.  L SIL involves the ecto and" cervical margin from 3-6 o'clock.  LSIL involves the ectocervical margin and abuts the endocervical margin from 9-12 o'clock.  No high-grade dysplasia or malignancy seen.  Top-Hat portion of the LEEP shows LSIL focally extending to the inked margin.  Interval History: Doing well. No new symptoms since last visit.  Past Medical/Surgical History: Past Medical History:  Diagnosis Date   Abnormal Pap smear    ASCUS 08/1999; CIN 11/1999, s/p conization, 12-26-2018 AGUS   Allergic rhinitis, cause unspecified    Anxiety    Basal cell carcinoma 35/00/9381   Complication of anesthesia    "difficulty awakening" remote past   DDD (degenerative disc disease), cervical 05/05/2018   Noted on imaging   DDD (degenerative disc disease), lumbar 06/08/2018   Noted on imaging   Depression    Endometrial polyp    Family history of adverse reaction to anesthesia    mother slow to awaken   GERD (gastroesophageal reflux disease)    Headache    Insomnia    PMB (postmenopausal bleeding) 05/08/2021   Post-nasal drip 05/08/2021   uses  mucinex daily for   Spondylolysis, lumbar region 07/13/2003   Noted on imaging   Stenosis, cervical spine 05/05/2018   Noted on imaging  Vitamin D deficiency     Past Surgical History:  Procedure Laterality  Date   ANKLE SURGERY  child   left for torn ligaments, left   ANTERIOR CERVICAL DECOMP/DISCECTOMY FUSION N/A 01/27/2016   Procedure: C5-6, C6-7 Anterior Cervical Discectomy and Fusion, Allograft, Plate;  Surgeon: Marybelle Killings, MD;  Location: [place];  Service: Orthopedics;  Laterality: N/A;   CERVICAL CONIZATION W/BX  2001   Laser conization - Dr. Rhodia Albright - [facility] - Moderate to severe dysplasia, negative margins   COLONOSCOPY  09/2015   COLPOSCOPY N/A 06/15/2022   Procedure: COLPOSCOPY;  Surgeon: Nunzio Cobbs, MD;  Location: [facility];  Service: Gynecology;  Laterality: N/A;   DILATATION & CURETTAGE/HYSTEROSCOPY WITH MYOSURE N/A 01/23/2019   Procedure: DILATATION & CURETTAGE/HYSTEROSCOPY WITH MYOSURE;  Surgeon: Nunzio Cobbs, MD;  Location[facility];  Service: Gynecology;  Laterality: N/A;   DILATATION & CURETTAGE/HYSTEROSCOPY WITH MYOSURE N/A 05/13/2021   Procedure: DILATATION & CURETTAGE/HYSTEROSCOPY WITH MYOSURE RESECTION OF ENDOMETRIAL POLYP, ENDOCERVICAL CURETTAGE;  Surgeon: Nunzio Cobbs, MD;  Location: [facility];  Service: Gynecology;  Laterality: N/A;   DILATATION & CURETTAGE/HYSTEROSCOPY WITH MYOSURE N/A 06/15/2022   Procedure: DILATATION & CURETTAGE/HYSTEROSCOPY WITH MYOSURE;  Surgeon: Nunzio Cobbs, MD;  Location: [facility];  Service: Gynecology;  Laterality: N/A;   LEEP N/A 06/15/2022   Procedure: LOOP ELECTROSURGICAL EXCISION PROCEDURE (LEEP);  Surgeon: Nunzio Cobbs, MD;  Location: [facility];  Service: Gynecology;  Laterality: N/A;   LUMBAR DISC SURGERY  10/2018   LUMBAR MICRODISCECTOMY Right 11/09/2018   lumbar level:L5-S1 Utilizing microscope-Dr.Gary Cram   NASAL SEPTOPLASTY W/ TURBINOPLASTY Bilateral 10/07/2015   Procedure: NASAL SEPTOPLASTY WITH BILATERAL TURBINATE REDUCTION;  Surgeon: Leta Baptist, MD;  Location: [place];   Service: ENT;  Laterality: Bilateral;   PILONIDAL CYST EXCISION  1988   WISDOM TOOTH EXTRACTION     yrs ago    Family History  Problem Relation Age of Onset   Hypertension Mother    Diabetes Mother    GER disease Mother    Heart disease Father    Brain cancer Sister        glioblastoma   Depression Sister    Hypertension Sister    Colon polyps Maternal Uncle    GER disease Maternal Uncle    Colon cancer Maternal Grandmother         died at 53   Diabetes Maternal Grandmother    Alzheimer's disease Paternal Grandmother    Breast cancer Cousin    Migraines Neg Hx    Ovarian cancer Neg Hx    Endometrial cancer Neg Hx    Pancreatic cancer Neg Hx    Prostate cancer Neg Hx     Social History   Socioeconomic History   Marital status: Soil scientist    Spouse name: Not on file   Number of children: 0   Years of education: 16   Highest education level: Not on file  Occupational History   Occupation: Naval architect: [employer]  Tobacco Use   Smoking status: Never   Smokeless tobacco: Never  Vaping Use   Vaping Use: Never used  Substance and Sexual Activity   Alcohol use: No    Alcohol/week: 0.0 standard drinks of alcohol   Drug use: Never   Sexual activity: Not Currently    Partners: Female  Birth control/protection: Abstinence, Post-menopausal  Other Topics Concern   Not on file  Social History Narrative   Lives with female partner Juliann Pulse), 2 beagles   Social Determinants of Health   Financial Resource Strain: Not on file  Food Insecurity: Not on file  Transportation Needs: Not on file  Physical Activity: Not on file  Stress: Not on file  Social Connections: Not on file    Current Medications:  Current Facility-Administered Medications:    ceFAZolin (ANCEF) IVPB 2g/100 mL premix, 2 g, Intravenous, On Call to OR, Cross, Melissa D, NP   dexamethasone (DECADRON) injection 4 mg, 4 mg, Intravenous, On Call to OR, Elinor Parkinson, Carollee Massed, NP    lactated ringers infusion, , Intravenous, Continuous, Duane Boston, MD, Last Rate: 10 mL/hr at 07/30/22 0626, Continued from Pre-op at 07/30/22 0626   scopolamine (TRANSDERM-SCOP) 1 MG/3DAYS 1.5 mg, 1 patch, Transdermal, On Call to OR, Cross, Melissa D, NP, 1.5 mg at 07/30/22 1610  Review of Systems: Denies appetite changes, fevers, chills, fatigue, unexplained weight changes. Denies hearing loss, neck lumps or masses, mouth sores, ringing in ears or voice changes. Denies cough or wheezing.  Denies shortness of breath. Denies chest pain or palpitations. Denies leg swelling. Denies abdominal distention, pain, blood in stools, constipation, diarrhea, nausea, vomiting, or early satiety. Denies pain with intercourse, dysuria, frequency, hematuria or incontinence. Denies hot flashes, pelvic pain, vaginal bleeding or vaginal discharge.   Denies joint pain, back pain or muscle pain/cramps. Denies itching, rash, or wounds. Denies dizziness, headaches, numbness or seizures. Denies swollen lymph nodes or glands, denies easy bruising or bleeding. Denies anxiety, depression, confusion, or decreased concentration.  Physical Exam: BP 129/89   Pulse 70   Temp 98.3 F (36.8 C) (Oral)   Resp 13   Ht '5\' 4"'$  (1.626 m)   Wt 186 lb 15.2 oz (84.8 kg)   LMP 12/18/2015   SpO2 98%   BMI 32.09 kg/m  General: Alert, oriented, no acute distress.  HEENT: Normocephalic, atraumatic. Sclera anicteric.  Chest: Clear to auscultation bilaterally. No wheezes, rhonchi, or rales. Cardiovascular: Regular rate and rhythm, no murmurs, rubs, or gallops.  Abdomen: Normoactive bowel sounds. Soft, nondistended, nontender to palpation. No masses or hepatosplenomegaly appreciated. No palpable fluid wave.  Extremities: Grossly normal range of motion. Warm, well perfused. No edema bilaterally.  Skin: No rashes or lesions.   Laboratory & Radiologic Studies:    Latest Ref Rng & Units 07/21/2022   11:12 AM 06/15/2022    6:49 AM  09/24/2021    9:46 AM  CBC  WBC 4.0 - 10.5 K/uL 7.2  6.5  5.8   Hemoglobin 12.0 - 15.0 g/dL 13.3  12.7  13.2   Hematocrit 36.0 - 46.0 % 39.6  37.6  39.1   Platelets 150 - 400 K/uL 256  229  278       Latest Ref Rng & Units 07/21/2022   11:12 AM 06/15/2022    6:49 AM 09/24/2021    9:46 AM  BMP  Glucose 70 - 99 mg/dL 94  97  89   BUN 6 - 20 mg/dL '11  13  11   '$ Creatinine 0.44 - 1.00 mg/dL 0.96  1.13  1.04   BUN/Creat Ratio 9 - 23   11   Sodium 135 - 145 mmol/L 142  138  140   Potassium 3.5 - 5.1 mmol/L 4.2  3.6  4.7   Chloride 98 - 111 mmol/L 104  107  103   CO2 22 -  32 mmol/L '29  25  26   '$ Calcium 8.9 - 10.3 mg/dL 9.9  9.1  9.6    Assessment & Plan: BRISTYL MCLEES is a 56 y.o. woman with clinical stage I endometrial cancer.  Plan for staging surgery today with TRH/BSO, SLN biopsy. See counseling and plan documented on 06/23/22.  Jeral Pinch, MD  Division of Gynecologic Oncology  Department of Obstetrics and Gynecology  [facility]

## 2022-07-30 NOTE — Transfer of Care (Signed)
Immediate Anesthesia Transfer of Care Note  Patient: Brandi Carpenter  Procedure(s) Performed: XI ROBOTIC ASSISTED TOTAL HYSTERECTOMY WITH BILATERAL SALPINGO OOPHORECTOMY, POSSIBLE LAPAROTOMY (Bilateral) SENTINEL NODE BIOPSY  Patient Location: PACU  Anesthesia Type:General  Level of Consciousness: sedated  Airway & Oxygen Therapy: Patient Spontanous Breathing and Patient connected to face mask oxygen  Post-op Assessment: Report given to RN and Post -op Vital signs reviewed and stable  Post vital signs: Reviewed and stable  Last Vitals:  Vitals Value Taken Time  BP    Temp    Pulse 80 07/30/22 1000  Resp 14 07/30/22 1000  SpO2 100 % 07/30/22 1000  Vitals shown include unvalidated device data.  Last Pain:  Vitals:   07/30/22 0609  TempSrc:   PainSc: 4       Patients Stated Pain Goal: 3 (03/88/82 8003)  Complications: No notable events documented.

## 2022-07-30 NOTE — Op Note (Signed)
OPERATIVE NOTE  Pre-operative Diagnosis: endometrial cancer grade 1  Post-operative Diagnosis: same  Operation: Robotic-assisted laparoscopic total hysterectomy with bilateral salpingoophorectomy, SLN biopsy bilaterally  Surgeon: Jeral Pinch MD  Assistant Surgeon: Joylene John NP  Anesthesia: GET  Urine Output: 200 cc  Operative Findings: On EUA, small mobile uterus. On intra-abdominal entry, normal upper abdominal survey. Normal omentum, small and large bowel. No ascites. Uterus 8 cm and normal appearing. Normal appearing bilateral tubes and ovaries. Mapping successful to bilateral obturator sentinel lymph nodes. Left somewhat larger but not grossly involved with tumor. No intra-abdominal findings c/w metastatic disease.  Estimated Blood Loss:  50 cc      Total IV Fluids: see I&O flowsheet         Specimens: uterus, cervix, bilateral tubes and ovaries, bilateral obturator SLNs         Complications:  None apparent; patient tolerated the procedure well.         Disposition: PACU - hemodynamically stable.  Procedure Details  The patient was seen in the Holding Room. The risks, benefits, complications, treatment options, and expected outcomes were discussed with the patient.  The patient concurred with the proposed plan, giving informed consent.  The site of surgery properly noted/marked. The patient was identified as Brandi Carpenter and the procedure verified as a Robotic-assisted hysterectomy with bilateral salpingo oophorectomy with SLN biopsy.   After induction of anesthesia, the patient was draped and prepped in the usual sterile manner. Patient was placed in supine position after anesthesia and draped and prepped in the usual sterile manner as follows: Her arms were tucked to her side with all appropriate precautions.  The patient was secured to the bed using padding and tape across her chest.  The patient was placed in the semi-lithotomy position in [place].  The  perineum and vagina were prepped with Betadine. [name] abdomen was prepped with ChloraPrep and she was draped after the prep had been allowed to dry for 3 minutes.  A Time Out was held and the above information confirmed.  The urethra was prepped with Betadine. Foley catheter was placed.  A sterile speculum was placed in the vagina.  The cervix was grasped with a single-tooth tenaculum. '2mg'$  total of ICG was injected into the cervical stroma at 2 and 9 o'clock with 1cc injected at a 1cm and 46m depth (concentration 0.'5mg'$ /ml) in all locations. The cervix was dilated with PKennon Roundsdilators.  The ZUMI uterine manipulator with a medium colpotomizer ring was placed without difficulty.  A pneum occluder balloon was placed over the manipulator.  OG tube placement was confirmed and to suction.   Next, a 10 mm skin incision was made 1 cm below the subcostal margin in the midclavicular line.  The 5 mm Optiview port and scope was used for direct entry.  Opening pressure was under 10 mm CO2.  The abdomen was insufflated and the findings were noted as above.   At this point and all points during the procedure, the patient's intra-abdominal pressure did not exceed 15 mmHg. Next, an 8 mm skin incision was made superior to the umbilicus and a right and left port were placed about 8 cm lateral to the robot port on the right and left side.  A fourth arm was placed on the right.  The 5 mm assist trocar was exchanged for a 10-12 mm port. All ports were placed under direct visualization.  The patient was placed in steep Trendelenburg.  Bowel was folded away into the  upper abdomen.  The robot was docked in the normal manner.  The right and left peritoneum were opened parallel to the IP ligament to open the retroperitoneal spaces bilaterally. The round ligaments were transected. The SLN mapping was performed in bilateral pelvic basins. After identifying the ureters, the para rectal and paravesical spaces were opened up entirely  with careful dissection below the level of the ureters bilaterally and to the depth of the uterine artery origin in order to skeletonize the uterine "web" and ensure visualization of all parametrial channels. The para-aortic basins were carefully exposed and evaluated for isolated para-aortic SLN's. Lymphatic channels were identified travelling to the following visualized sentinel lymph node's: bilateral obturator SLNs. These SLN's were separated from their surrounding lymphatic tissue, removed and sent for permanent pathology.  The hysterectomy was started.  The ureter was again noted to be on the medial leaf of the broad ligament.  The peritoneum above the ureter was incised and stretched and the infundibulopelvic ligament was skeletonized, cauterized and cut.  The posterior peritoneum was taken down to the level of the KOH ring.  The anterior peritoneum was also taken down.  The bladder flap was created to the level of the KOH ring.  The uterine artery on the right side was skeletonized, cauterized and cut in the normal manner.  A similar procedure was performed on the left.  The colpotomy was made and the uterus, cervix, bilateral ovaries and tubes were amputated and delivered through the vagina.  Pedicles were inspected and excellent hemostasis was achieved.    The colpotomy at the vaginal cuff was closed with 0 Vicryl on a CT1 needle in a running manner.  Irrigation was used and excellent hemostasis was achieved.  At this point in the procedure was completed.  Robotic instruments were removed under direct visulaization.  The robot was undocked. The fascia at the 10-12 mm port was closed with 0 Vicryl using a PMI fascial closure device under direct visualization.  The subcuticular tissue was closed with 4-0 Vicryl and the skin was closed with 4-0 Monocryl in a subcuticular manner.  Dermabond was applied.    The vagina was swabbed with minimal bleeding noted. Foley catheter was removed  All sponge, lap and  needle counts were correct x  3.   The patient was transferred to the recovery room in stable condition.  Jeral Pinch, MD

## 2022-07-30 NOTE — Anesthesia Postprocedure Evaluation (Signed)
Anesthesia Post Note  Patient: Brandi Carpenter  Procedure(s) Performed: XI ROBOTIC ASSISTED TOTAL HYSTERECTOMY WITH BILATERAL SALPINGO OOPHORECTOMY, POSSIBLE LAPAROTOMY (Bilateral) SENTINEL NODE BIOPSY     Patient location during evaluation: PACU Anesthesia Type: General Level of consciousness: awake and alert Pain management: pain level controlled Vital Signs Assessment: post-procedure vital signs reviewed and stable Respiratory status: spontaneous breathing, nonlabored ventilation and respiratory function stable Cardiovascular status: blood pressure returned to baseline and stable Postop Assessment: no apparent nausea or vomiting Anesthetic complications: no   No notable events documented.  Last Vitals:  Vitals:   07/30/22 1015 07/30/22 1030  BP: (!) 143/91 (!) 151/98  Pulse: 81 77  Resp: 16 13  Temp:    SpO2: 98% 100%    Last Pain:  Vitals:   07/30/22 1030  TempSrc:   PainSc: 4                  Peirce Deveney E Eleuterio Dollar

## 2022-07-30 NOTE — Anesthesia Procedure Notes (Signed)
Procedure Name: Intubation Date/Time: 07/30/2022 7:38 AM  Performed by: Lind Covert, CRNAPre-anesthesia Checklist: Patient identified, Emergency Drugs available, Suction available, Patient being monitored and Timeout performed Patient Re-evaluated:Patient Re-evaluated prior to induction Oxygen Delivery Method: Circle system utilized Preoxygenation: Pre-oxygenation with 100% oxygen Induction Type: IV induction Ventilation: Mask ventilation without difficulty Laryngoscope Size: Mac and 3 Grade View: Grade I Tube type: Oral Tube size: 7.0 mm Number of attempts: 1 Airway Equipment and Method: Stylet Placement Confirmation: ETT inserted through vocal cords under direct vision, positive ETCO2 and breath sounds checked- equal and bilateral Secured at: 22 cm Tube secured with: Tape Dental Injury: Teeth and Oropharynx as per pre-operative assessment

## 2022-07-30 NOTE — Discharge Instructions (Addendum)
AFTER SURGERY INSTRUCTIONS   Return to work: 4-6 weeks if applicable  Today Dr. Berline Lopes removed your uterus, cervix, both fallopian tubes and ovaries, and a sentinel lymph node from the right and left pelvis. The surgery went well.   The celexa you take along with ibuprofen can increase your risk of developing a gastrointestinal bleed so would recommend using the ibuprofen sparingly and take with food.   Activity: 1. Be up and out of the bed during the day.  Take a nap if needed.  You may walk up steps but be careful and use the hand rail.  Stair climbing will tire you more than you think, you may need to stop part way and rest.    2. No lifting or straining for 6 weeks over 10 pounds. No pushing, pulling, straining for 6 weeks.   3. No driving for around 1 week(s).  Do not drive if you are taking narcotic pain medicine and make sure that your reaction time has returned.    4. You can shower as soon as the next day after surgery. Shower daily.  Use your regular soap and water (not directly on the incision) and pat your incision(s) dry afterwards; don't rub.  No tub baths or submerging your body in water until cleared by your surgeon. If you have the soap that was given to you by pre-surgical testing that was used before surgery, you do not need to use it afterwards because this can irritate your incisions.    5. No sexual activity and nothing in the vagina for 8-10 weeks.   6. You may experience a small amount of clear drainage from your incisions, which is normal.  If the drainage persists, increases, or changes color please call the office.   7. Do not use creams, lotions, or ointments such as neosporin on your incisions after surgery until advised by your surgeon because they can cause removal of the dermabond glue on your incisions.     8. You may experience vaginal spotting after surgery or around the 6-8 week mark from surgery when the stitches at the top of the vagina begin to dissolve.   The spotting is normal but if you experience heavy bleeding, call our office.   9. Take Tylenol first for pain if you are able to take these medications and only use narcotic pain medication for severe pain not relieved by the Tylenol.  Monitor your Tylenol intake to a max of 4,000 mg in a 24 hour period.    Diet: 1. Low sodium Heart Healthy Diet is recommended but you are cleared to resume your normal (before surgery) diet after your procedure.   2. It is safe to use a laxative, such as Miralax or Colace, if you have difficulty moving your bowels. You have been prescribed Sennakot-S to take at bedtime every evening after surgery to keep bowel movements regular and to prevent constipation.     Wound Care: 1. Keep clean and dry.  Shower daily.   Reasons to call the Doctor: Fever - Oral temperature greater than 100.4 degrees Fahrenheit Foul-smelling vaginal discharge Difficulty urinating Nausea and vomiting Increased pain at the site of the incision that is unrelieved with pain medicine. Difficulty breathing with or without chest pain New calf pain especially if only on one side Sudden, continuing increased vaginal bleeding with or without clots.   Contacts: For questions or concerns you should contact:   Dr. Jeral Pinch at [place], NP at  [phone]   After Hours: call [phone] and have the GYN Oncologist paged/contacted (after 5 pm or on the weekends).   Messages sent via mychart are for non-urgent matters and are not responded to after hours so for urgent needs, please call the after hours number.

## 2022-07-31 ENCOUNTER — Telehealth: Payer: Self-pay | Admitting: Surgery

## 2022-07-31 ENCOUNTER — Encounter (HOSPITAL_COMMUNITY): Payer: Self-pay | Admitting: Gynecologic Oncology

## 2022-07-31 NOTE — Telephone Encounter (Signed)
Spoke with Brandi Carpenter this morning. She states she is eating, drinking and urinating well. She has not had a BM yet but is passing gas. She is taking senokot as prescribed and encouraged her to drink plenty of water. She denies fever or chills. Incisions are dry and intact. She rates her pain 3/10. Her pain is controlled with Tramadol and Tylenol.    Instructed to call office with any fever, chills, purulent drainage, uncontrolled pain or any other questions or concerns. Patient verbalizes understanding.   Pt aware of post op appointments as well as the office number [phone] and after hours number [phone] to call if she has any questions or concerns

## 2022-08-04 LAB — SURGICAL PATHOLOGY

## 2022-08-05 ENCOUNTER — Encounter: Payer: Self-pay | Admitting: Gynecologic Oncology

## 2022-08-05 ENCOUNTER — Inpatient Hospital Stay: Payer: BC Managed Care – PPO | Attending: Gynecologic Oncology | Admitting: Gynecologic Oncology

## 2022-08-05 DIAGNOSIS — C541 Malignant neoplasm of endometrium: Secondary | ICD-10-CM

## 2022-08-05 DIAGNOSIS — Z7189 Other specified counseling: Secondary | ICD-10-CM

## 2022-08-05 DIAGNOSIS — Z90710 Acquired absence of both cervix and uterus: Secondary | ICD-10-CM

## 2022-08-05 DIAGNOSIS — Z90722 Acquired absence of ovaries, bilateral: Secondary | ICD-10-CM

## 2022-08-05 NOTE — Progress Notes (Signed)
Gynecologic Oncology Telehealth Note: Gyn-Onc  I connected with Brandi Carpenter on 08/05/22 at  4:00 PM EST by telephone and verified that I am speaking with the correct person using two identifiers.  I discussed the limitations, risks, security and privacy concerns of performing an evaluation and management service by telemedicine and the availability of in-person appointments. I also discussed with the patient that there may be a patient responsible charge related to this service. The patient expressed understanding and agreed to proceed.  Other persons participating in the visit and their role in the encounter: Brandi Carpenter, patient's spouse.  Patient's location: home Provider's location: [facility]  Reason for Visit: follow-up after surgery  Treatment History: Oncology History Overview Note  Pap test on 02/11/2022 showed atypical glandular cells, not otherwise specified.  High risk HPV negative.  05/14/2022 revealing 3 filling defects of the endometrium.    Endometrial adenocarcinoma ([place])  03/06/2022 Procedure   colposcopy was performed with biopsies including endocervical curettage and endometrial biopsy on 03/06/2022.  This showed fragments of normal endocervical tissue mixed with few strips of metaplastic squamous epithelium.  Endometrial biopsy revealed fragments of benign endometrial surface epithelium with fragments of normal endocervical epithelium.  No hyperplasia, malignancy, polyps, or endometritis.    06/15/2022 Surgery   colposcopy with LEEP, ECC, hysteroscopy with MyoSure resection of endometrial polyps and D&C.  Pathology revealed focal endometrioid adenocarcinoma, FIGO grade 1 involving fragments of an endometrial polyp from the polypectomy.  D&C showed fragments of benign endometrial surface epithelium admixed with fragments of ecto and endocervical epithelium, no hyperplasia or malignancy.  Endocervical curettage showed fragments of benign endocervical epithelium, no malignancy.  LEEP  showed L SIL involving ectocervical margin and abutting the endocervical margin from 12-3 o'clock.  L SIL involves the ecto and" cervical margin from 3-6 o'clock.  LSIL involves the ectocervical margin and abuts the endocervical margin from 9-12 o'clock.  No high-grade dysplasia or malignancy seen.  Top-Hat portion of the LEEP shows L SIL focally extending to the inked margin.    06/23/2022 Initial Diagnosis   Endometrial adenocarcinoma ([place])   07/30/2022 Surgery   Robotic-assisted laparoscopic total hysterectomy with bilateral salpingoophorectomy, SLN biopsy bilaterally   Findings: On EUA, small mobile uterus. On intra-abdominal entry, normal upper abdominal survey. Normal omentum, small and large bowel. No ascites. Uterus 8 cm and normal appearing. Normal appearing bilateral tubes and ovaries. Mapping successful to bilateral obturator sentinel lymph nodes. Left somewhat larger but not grossly involved with tumor. No intra-abdominal findings c/w metastatic disease.    07/30/2022 Pathology Results   A. RIGHT OBTURATOR SENTINEL LYMPH NODE, BIOPSY: One benign lymph node, negative for carcinoma (0/1)  B. LEFT OBTURATOR SENTINEL LYMPH NODE, BIOPSY: One benign lymph node, negative for carcinoma (0/1)  C. UTERUS WITH RIGHT AND LEFT FALLOPIAN TUBE AND OVARY, HYSTERECTOMY AND BILATERAL SALPINGO-OOPHORECTOMY: Negative for residual carcinoma (ypT0) (see comment) Adenomyosis Benign leiomyomata, intramural, measuring up to 0.6 cm in greatest dimension Benign inactive endometrium with cystic change Mild chronic cervicitis with squamous metaplasia Benign tubes and ovaries  COMMENT: The endometrium is entirely submitted and shows no evidence of residual carcinoma.  The prior diagnostic biopsy is reviewed ([id number]) and shows a focal well-differentiated endometrioid adenocarcinoma involving four fragments of an apparent polyp.  No carcinoma or residual polyp is evident within this  hysterectomy. Immunohistochemical stains for pancytokeratin (AE1/AE3) performed on the right and left obturator sentinel lymph nodes are negative for metastatic tumor with adequate control.      Interval History: Doing well.  Some soreness and cramping, improving.  Reports bowels functioning well, denies urinary symptoms. Denies bleeding or discharge.  Past Medical/Surgical History: Past Medical History:  Diagnosis Date   Abnormal Pap smear    ASCUS 08/1999; CIN 11/1999, s/p conization, 12-26-2018 AGUS   Allergic rhinitis, cause unspecified    Anxiety    Basal cell carcinoma 75/44/9201   Complication of anesthesia    "difficulty awakening" remote past   DDD (degenerative disc disease), cervical 05/05/2018   Noted on imaging   DDD (degenerative disc disease), lumbar 06/08/2018   Noted on imaging   Depression    Endometrial polyp    Family history of adverse reaction to anesthesia    mother slow to awaken   GERD (gastroesophageal reflux disease)    Headache    Insomnia    PMB (postmenopausal bleeding) 05/08/2021   Post-nasal drip 05/08/2021   uses  mucinex daily for   Spondylolysis, lumbar region 07/13/2003   Noted on imaging   Stenosis, cervical spine 05/05/2018   Noted on imaging   Vitamin D deficiency     Past Surgical History:  Procedure Laterality Date   ANKLE SURGERY  child   left for torn ligaments, left   ANTERIOR CERVICAL DECOMP/DISCECTOMY FUSION N/A 01/27/2016   Procedure: C5-6, C6-7 Anterior Cervical Discectomy and Fusion, Allograft, Plate;  Surgeon: Marybelle Killings, MD;  Location: [place];  Service: Orthopedics;  Laterality: N/A;   CERVICAL CONIZATION W/BX  2001   Laser conization - Dr. Rhodia Albright - [facility] - Moderate to severe dysplasia, negative margins   COLONOSCOPY  09/2015   COLPOSCOPY N/A 06/15/2022   Procedure: COLPOSCOPY;  Surgeon: Nunzio Cobbs, MD;  Location: [facility];  Service: Gynecology;  Laterality: N/A;   DILATATION &  CURETTAGE/HYSTEROSCOPY WITH MYOSURE N/A 01/23/2019   Procedure: DILATATION & CURETTAGE/HYSTEROSCOPY WITH MYOSURE;  Surgeon: Nunzio Cobbs, MD;  Location: [facility];  Service: Gynecology;  Laterality: N/A;   DILATATION & CURETTAGE/HYSTEROSCOPY WITH MYOSURE N/A 05/13/2021   Procedure: DILATATION & CURETTAGE/HYSTEROSCOPY WITH MYOSURE RESECTION OF ENDOMETRIAL POLYP, ENDOCERVICAL CURETTAGE;  Surgeon: Nunzio Cobbs, MD;  Location: [facility];  Service: Gynecology;  Laterality: N/A;   DILATATION & CURETTAGE/HYSTEROSCOPY WITH MYOSURE N/A 06/15/2022   Procedure: DILATATION & CURETTAGE/HYSTEROSCOPY WITH MYOSURE;  Surgeon: Nunzio Cobbs, MD;  Location: [facility];  Service: Gynecology;  Laterality: N/A;   LEEP N/A 06/15/2022   Procedure: LOOP ELECTROSURGICAL EXCISION PROCEDURE (LEEP);  Surgeon: Nunzio Cobbs, MD;  Location[facility];  Service: Gynecology;  Laterality: N/A;   LUMBAR DISC SURGERY  10/2018   LUMBAR MICRODISCECTOMY Right 11/09/2018   lumbar level:L5-S1 Utilizing microscope-Dr.Gary Cram   NASAL SEPTOPLASTY W/ TURBINOPLASTY Bilateral 10/07/2015   Procedure: NASAL SEPTOPLASTY WITH BILATERAL TURBINATE REDUCTION;  Surgeon: Leta Baptist, MD;  Location: [place];  Service: ENT;  Laterality: Bilateral;   PILONIDAL CYST EXCISION  1988   ROBOTIC ASSISTED TOTAL HYSTERECTOMY WITH BILATERAL SALPINGO OOPHERECTOMY Bilateral 07/30/2022   Procedure: XI ROBOTIC ASSISTED TOTAL HYSTERECTOMY WITH BILATERAL SALPINGO OOPHORECTOMY, POSSIBLE LAPAROTOMY;  Surgeon: Lafonda Mosses, MD;  Location: [facility];  Service: Gynecology;  Laterality: Bilateral;   SENTINEL NODE BIOPSY N/A 07/30/2022   Procedure: SENTINEL NODE BIOPSY;  Surgeon: Lafonda Mosses, MD;  Location: [facility];  Service: Gynecology;  Laterality: N/A;   WISDOM TOOTH EXTRACTION     yrs ago    Family History  Problem Relation Age of  Onset   Hypertension Mother    Diabetes Mother    GER disease Mother    Heart disease Father    Brain cancer Sister        glioblastoma   Depression Sister    Hypertension Sister    Colon polyps Maternal Uncle    GER disease Maternal Uncle    Colon cancer Maternal Grandmother         died at 53   Diabetes Maternal Grandmother    Alzheimer's disease Paternal Grandmother    Breast cancer Cousin    Migraines Neg Hx    Ovarian cancer Neg Hx    Endometrial cancer Neg Hx    Pancreatic cancer Neg Hx    Prostate cancer Neg Hx     Social History   Socioeconomic History   Marital status: Soil scientist    Spouse name: Not on file   Number of children: 0   Years of education: 16   Highest education level: Not on file  Occupational History   Occupation: Naval architect: [employer]  Tobacco Use   Smoking status: Never   Smokeless tobacco: Never  Vaping Use   Vaping Use: Never used  Substance and Sexual Activity   Alcohol use: No    Alcohol/week: 0.0 standard drinks of alcohol   Drug use: Never   Sexual activity: Not Currently    Partners: Female    Birth control/protection: Abstinence, Post-menopausal  Other Topics Concern   Not on file  Social History Narrative   Lives with female partner Brandi Carpenter), 2 beagles   Social Determinants of Health   Financial Resource Strain: Not on file  Food Insecurity: Not on file  Transportation Needs: Not on file  Physical Activity: Not on file  Stress: Not on file  Social Connections: Not on file    Current Medications:  Current Outpatient Medications:    ALPRAZolam (XANAX) 0.5 MG tablet, TAKE 1/2 TO 1 TABLET BY MOUTH THREE TIMES DAILY AS NEEDED FOR ANXIETY, Disp: 20 tablet, Rfl: 0   buPROPion (WELLBUTRIN XL) 150 MG 24 hr tablet, Take 150 mg by mouth daily., Disp: , Rfl:    Cholecalciferol (VITAMIN D) 2000 UNITS tablet, Take 2,000 Units by mouth daily., Disp: , Rfl:    citalopram (CELEXA) 20 MG tablet, Take 20 mg  by mouth every evening., Disp: , Rfl:    diphenhydrAMINE (BENADRYL) 25 MG tablet, Take 25 mg by mouth every 6 (six) hours as needed for allergies., Disp: , Rfl:    doxycycline (ADOXA) 100 MG tablet, Take 100 mg by mouth 2 (two) times daily. Take for 5 days, Disp: , Rfl:    EPINEPHrine 0.3 mg/0.3 mL IJ SOAJ injection, Inject 0.3 mg into the muscle as needed for anaphylaxis., Disp: , Rfl:    famotidine (PEPCID) 20 MG tablet, Take 20 mg by mouth daily., Disp: , Rfl:    fluticasone (FLONASE) 50 MCG/ACT nasal spray, Place 2 sprays into both nostrils daily., Disp: , Rfl: 0   gabapentin (NEURONTIN) 400 MG capsule, Take 400 mg by mouth every evening., Disp: , Rfl:    guaiFENesin (MUCINEX) 600 MG 12 hr tablet, Take 1,200 mg by mouth 2 (two) times daily., Disp: , Rfl:    ibuprofen (ADVIL) 800 MG tablet, Take 1 tablet (800 mg total) by mouth every 8 (eight) hours as needed. (Patient not taking: Reported on 07/16/2022), Disp: 30 tablet, Rfl: 0   levocetirizine (XYZAL) 5 MG tablet,  TAKE 1 TABLET BY MOUTH EVERY EVENING, Disp: 90 tablet, Rfl: 3   montelukast (SINGULAIR) 10 MG tablet, Take 10 mg by mouth every evening., Disp: , Rfl:    Olopatadine HCl 0.2 % SOLN, Place 1 drop into both eyes daily. (Patient not taking: Reported on 07/16/2022), Disp: 2.5 mL, Rfl: 1   senna-docusate (SENOKOT-S) 8.6-50 MG tablet, Take 2 tablets by mouth at bedtime. For AFTER surgery, do not take if having diarrhea, Disp: 30 tablet, Rfl: 0   sodium chloride (OCEAN) 0.65 % SOLN nasal spray, Place 1 spray into both nostrils daily., Disp: , Rfl:    traMADol (ULTRAM) 50 MG tablet, Take 1 tablet (50 mg total) by mouth every 6 (six) hours as needed for severe pain. For AFTER surgery only, do not take and drive, Disp: 10 tablet, Rfl: 0   zolpidem (AMBIEN) 10 MG tablet, TAKE 1 TABLET(10 MG) BY MOUTH AT BEDTIME AS NEEDED FOR SLEEP, Disp: 30 tablet, Rfl: 0  Review of Symptoms: Pertinent positives as per HPI.  Physical Exam: Deferred given  limitations of phone visit.  Laboratory & Radiologic Studies: A. RIGHT OBTURATOR SENTINEL LYMPH NODE, BIOPSY:  One benign lymph node, negative for carcinoma (0/1)   B. LEFT OBTURATOR SENTINEL LYMPH NODE, BIOPSY:  One benign lymph node, negative for carcinoma (0/1)   C. UTERUS WITH RIGHT AND LEFT FALLOPIAN TUBE AND OVARY, HYSTERECTOMY AND  BILATERAL SALPINGO-OOPHORECTOMY:  Negative for residual carcinoma (ypT0) (see comment)  Adenomyosis  Benign leiomyomata, intramural, measuring up to 0.6 cm in greatest  dimension  Benign inactive endometrium with cystic change  Mild chronic cervicitis with squamous metaplasia  Benign tubes and ovaries   Assessment & Plan: Brandi Carpenter is a 56 y.o. 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  Doing well, meeting milestones. Discussed continued expectations and restrictions.  Will reach out to pathology about getting MMR/MSI/p53 on original pathology.  Discussed final pathology. Patient happy with this news. No adjuvant treatment indicated. Will discuss surveillance plan at in person visit.  I discussed the assessment and treatment plan with the patient. The patient was provided with an opportunity to ask questions and all were answered. The patient agreed with the plan and demonstrated an understanding of the instructions.   The patient was advised to call back or see an in-person evaluation if the symptoms worsen or if the condition fails to improve as anticipated.   8 minutes of total time was spent for this patient encounter, including preparation, phone counseling with the patient and coordination of care, and documentation of the encounter.   Jeral Pinch, MD  Division of Gynecologic Oncology  Department of Obstetrics and Gynecology  [facility]

## 2022-08-05 NOTE — Progress Notes (Signed)
[facility] path notified about need for MMR/MSI/p53 on D&C specimen.

## 2022-08-07 ENCOUNTER — Other Ambulatory Visit: Payer: Self-pay

## 2022-08-11 LAB — SURGICAL PATHOLOGY

## 2022-08-14 ENCOUNTER — Telehealth: Payer: Self-pay | Admitting: *Deleted

## 2022-08-14 NOTE — Telephone Encounter (Signed)
Moved patient's appt on 12/8 from 2:30 pm to 11:45 am

## 2022-08-20 ENCOUNTER — Other Ambulatory Visit: Payer: Self-pay | Admitting: Family Medicine

## 2022-08-20 DIAGNOSIS — G2581 Restless legs syndrome: Secondary | ICD-10-CM

## 2022-08-21 ENCOUNTER — Inpatient Hospital Stay: Payer: BC Managed Care – PPO | Attending: Gynecologic Oncology | Admitting: Gynecologic Oncology

## 2022-08-21 ENCOUNTER — Encounter: Payer: Self-pay | Admitting: Gynecologic Oncology

## 2022-08-21 VITALS — BP 131/70 | HR 73 | Temp 98.3°F | Resp 16 | Wt 191.0 lb

## 2022-08-21 DIAGNOSIS — Z7189 Other specified counseling: Secondary | ICD-10-CM

## 2022-08-21 DIAGNOSIS — Z90710 Acquired absence of both cervix and uterus: Secondary | ICD-10-CM

## 2022-08-21 DIAGNOSIS — Z90722 Acquired absence of ovaries, bilateral: Secondary | ICD-10-CM

## 2022-08-21 DIAGNOSIS — C541 Malignant neoplasm of endometrium: Secondary | ICD-10-CM

## 2022-08-21 NOTE — Progress Notes (Signed)
Gynecologic Oncology Return Clinic Visit  08/21/22  Reason for Visit: follow-up after surgery, treatment planning  Treatment History: Oncology History Overview Note  Pap test on 02/11/2022 showed atypical glandular cells, not otherwise specified.  High risk HPV negative.  05/14/2022 revealing 3 filling defects of the endometrium.   P53 wild-type MMR intact   Endometrial adenocarcinoma ([place])  03/06/2022 Procedure   colposcopy was performed with biopsies including endocervical curettage and endometrial biopsy on 03/06/2022.  This showed fragments of normal endocervical tissue mixed with few strips of metaplastic squamous epithelium.  Endometrial biopsy revealed fragments of benign endometrial surface epithelium with fragments of normal endocervical epithelium.  No hyperplasia, malignancy, polyps, or endometritis.    06/15/2022 Surgery   colposcopy with LEEP, ECC, hysteroscopy with MyoSure resection of endometrial polyps and D&C.  Pathology revealed focal endometrioid adenocarcinoma, FIGO grade 1 involving fragments of an endometrial polyp from the polypectomy.  D&C showed fragments of benign endometrial surface epithelium admixed with fragments of ecto and endocervical epithelium, no hyperplasia or malignancy.  Endocervical curettage showed fragments of benign endocervical epithelium, no malignancy.  LEEP showed L SIL involving ectocervical margin and abutting the endocervical margin from 12-3 o'clock.  L SIL involves the ecto and" cervical margin from 3-6 o'clock.  LSIL involves the ectocervical margin and abuts the endocervical margin from 9-12 o'clock.  No high-grade dysplasia or malignancy seen.  Top-Hat portion of the LEEP shows L SIL focally extending to the inked margin.    06/23/2022 Initial Diagnosis   Endometrial adenocarcinoma ([place])   07/30/2022 Surgery   Robotic-assisted laparoscopic total hysterectomy with bilateral salpingoophorectomy, SLN biopsy bilaterally   Findings: On EUA,  small mobile uterus. On intra-abdominal entry, normal upper abdominal survey. Normal omentum, small and large bowel. No ascites. Uterus 8 cm and normal appearing. Normal appearing bilateral tubes and ovaries. Mapping successful to bilateral obturator sentinel lymph nodes. Left somewhat larger but not grossly involved with tumor. No intra-abdominal findings c/w metastatic disease.    07/30/2022 Pathology Results   A. RIGHT OBTURATOR SENTINEL LYMPH NODE, BIOPSY: One benign lymph node, negative for carcinoma (0/1)  B. LEFT OBTURATOR SENTINEL LYMPH NODE, BIOPSY: One benign lymph node, negative for carcinoma (0/1)  C. UTERUS WITH RIGHT AND LEFT FALLOPIAN TUBE AND OVARY, HYSTERECTOMY AND BILATERAL SALPINGO-OOPHORECTOMY: Negative for residual carcinoma (ypT0) (see comment) Adenomyosis Benign leiomyomata, intramural, measuring up to 0.6 cm in greatest dimension Benign inactive endometrium with cystic change Mild chronic cervicitis with squamous metaplasia Benign tubes and ovaries  COMMENT: The endometrium is entirely submitted and shows no evidence of residual carcinoma.  The prior diagnostic biopsy is reviewed ([id number]) and shows a focal well-differentiated endometrioid adenocarcinoma involving four fragments of an apparent polyp.  No carcinoma or residual polyp is evident within this hysterectomy. Immunohistochemical stains for pancytokeratin (AE1/AE3) performed on the right and left obturator sentinel lymph nodes are negative for metastatic tumor with adequate control.      Interval History: Doing well.  Denies any significant pain.  Denies any vaginal bleeding or discharge.  Reports baseline bowel and bladder function.  Has some tenderness still around left upper quadrant incision, reports other incisions healing well.  Past Medical/Surgical History: Past Medical History:  Diagnosis Date   Abnormal Pap smear    ASCUS 08/1999; CIN 11/1999, s/p conization, 12-26-2018 AGUS    Allergic rhinitis, cause unspecified    Anxiety    Basal cell carcinoma 81/44/8185   Complication of anesthesia    "difficulty awakening" remote past   DDD (degenerative disc  disease), cervical 05/05/2018   Noted on imaging   DDD (degenerative disc disease), lumbar 06/08/2018   Noted on imaging   Depression    Endometrial polyp    Family history of adverse reaction to anesthesia    mother slow to awaken   GERD (gastroesophageal reflux disease)    Headache    Insomnia    PMB (postmenopausal bleeding) 05/08/2021   Post-nasal drip 05/08/2021   uses  mucinex daily for   Spondylolysis, lumbar region 07/13/2003   Noted on imaging   Stenosis, cervical spine 05/05/2018   Noted on imaging   Vitamin D deficiency     Past Surgical History:  Procedure Laterality Date   ANKLE SURGERY  child   left for torn ligaments, left   ANTERIOR CERVICAL DECOMP/DISCECTOMY FUSION N/A 01/27/2016   Procedure: C5-6, C6-7 Anterior Cervical Discectomy and Fusion, Allograft, Plate;  Surgeon: Marybelle Killings, MD;  Location: [place];  Service: Orthopedics;  Laterality: N/A;   CERVICAL CONIZATION W/BX  2001   Laser conization - Dr. Rhodia Albright - [facility] - Moderate to severe dysplasia, negative margins   COLONOSCOPY  09/2015   COLPOSCOPY N/A 06/15/2022   Procedure: COLPOSCOPY;  Surgeon: Nunzio Cobbs, MD;  Location: [facility];  Service: Gynecology;  Laterality: N/A;   DILATATION & CURETTAGE/HYSTEROSCOPY WITH MYOSURE N/A 01/23/2019   Procedure: DILATATION & CURETTAGE/HYSTEROSCOPY WITH MYOSURE;  Surgeon: Nunzio Cobbs, MD;  Location: [facility];  Service: Gynecology;  Laterality: N/A;   DILATATION & CURETTAGE/HYSTEROSCOPY WITH MYOSURE N/A 05/13/2021   Procedure: DILATATION & CURETTAGE/HYSTEROSCOPY WITH MYOSURE RESECTION OF ENDOMETRIAL POLYP, ENDOCERVICAL CURETTAGE;  Surgeon: Nunzio Cobbs, MD;  Location: [facility];  Service: Gynecology;   Laterality: N/A;   DILATATION & CURETTAGE/HYSTEROSCOPY WITH MYOSURE N/A 06/15/2022   Procedure: DILATATION & CURETTAGE/HYSTEROSCOPY WITH MYOSURE;  Surgeon: Nunzio Cobbs, MD;  Location: [facility];  Service: Gynecology;  Laterality: N/A;   LEEP N/A 06/15/2022   Procedure: LOOP ELECTROSURGICAL EXCISION PROCEDURE (LEEP);  Surgeon: Nunzio Cobbs, MD;  Location: [facility];  Service: Gynecology;  Laterality: N/A;   LUMBAR DISC SURGERY  10/2018   LUMBAR MICRODISCECTOMY Right 11/09/2018   lumbar level:L5-S1 Utilizing microscope-Dr.Gary Cram   NASAL SEPTOPLASTY W/ TURBINOPLASTY Bilateral 10/07/2015   Procedure: NASAL SEPTOPLASTY WITH BILATERAL TURBINATE REDUCTION;  Surgeon: Leta Baptist, MD;  Location: [employer] [facility];  Service: ENT;  Laterality: Bilateral;   PILONIDAL CYST EXCISION  1988   ROBOTIC ASSISTED TOTAL HYSTERECTOMY WITH BILATERAL SALPINGO OOPHERECTOMY Bilateral 07/30/2022   Procedure: XI ROBOTIC ASSISTED TOTAL HYSTERECTOMY WITH BILATERAL SALPINGO OOPHORECTOMY, POSSIBLE LAPAROTOMY;  Surgeon: Lafonda Mosses, MD;  Location: [facility];  Service: Gynecology;  Laterality: Bilateral;   SENTINEL NODE BIOPSY N/A 07/30/2022   Procedure: SENTINEL NODE BIOPSY;  Surgeon: Lafonda Mosses, MD;  Location: [facility];  Service: Gynecology;  Laterality: N/A;   WISDOM TOOTH EXTRACTION     yrs ago    Family History  Problem Relation Age of Onset   Hypertension Mother    Diabetes Mother    GER disease Mother    Heart disease Father    Brain cancer Sister        glioblastoma   Depression Sister    Hypertension Sister    Colon polyps Maternal Uncle    GER disease Maternal Uncle    Colon cancer Maternal Grandmother  died at 15   Diabetes Maternal Grandmother    Alzheimer's disease Paternal Grandmother    Breast cancer Cousin    Migraines Neg Hx    Ovarian cancer Neg Hx    Endometrial cancer Neg Hx    Pancreatic cancer Neg Hx     Prostate cancer Neg Hx     Social History   Socioeconomic History   Marital status: Soil scientist    Spouse name: Not on file   Number of children: 0   Years of education: 16   Highest education level: Not on file  Occupational History   Occupation: Naval architect: [employer]  Tobacco Use   Smoking status: Never   Smokeless tobacco: Never  Vaping Use   Vaping Use: Never used  Substance and Sexual Activity   Alcohol use: No    Alcohol/week: 0.0 standard drinks of alcohol   Drug use: Never   Sexual activity: Not Currently    Partners: Female    Birth control/protection: Abstinence, Post-menopausal  Other Topics Concern   Not on file  Social History Narrative   Lives with female partner Juliann Pulse), 2 beagles   Social Determinants of Health   Financial Resource Strain: Not on file  Food Insecurity: Not on file  Transportation Needs: Not on file  Physical Activity: Not on file  Stress: Not on file  Social Connections: Not on file    Current Medications:  Current Outpatient Medications:    ALPRAZolam (XANAX) 0.5 MG tablet, TAKE 1/2 TO 1 TABLET BY MOUTH THREE TIMES DAILY AS NEEDED FOR ANXIETY, Disp: 20 tablet, Rfl: 0   buPROPion (WELLBUTRIN XL) 150 MG 24 hr tablet, Take 150 mg by mouth daily., Disp: , Rfl:    Cholecalciferol (VITAMIN D) 2000 UNITS tablet, Take 2,000 Units by mouth daily., Disp: , Rfl:    citalopram (CELEXA) 20 MG tablet, Take 20 mg by mouth every evening., Disp: , Rfl:    diphenhydrAMINE (BENADRYL) 25 MG tablet, Take 25 mg by mouth every 6 (six) hours as needed for allergies., Disp: , Rfl:    EPINEPHrine 0.3 mg/0.3 mL IJ SOAJ injection, Inject 0.3 mg into the muscle as needed for anaphylaxis., Disp: , Rfl:    famotidine (PEPCID) 20 MG tablet, Take 20 mg by mouth daily., Disp: , Rfl:    fluticasone (FLONASE) 50 MCG/ACT nasal spray, Place 2 sprays into both nostrils daily., Disp: , Rfl: 0   gabapentin (NEURONTIN) 400 MG capsule, Take 400 mg  by mouth every evening., Disp: , Rfl:    guaiFENesin (MUCINEX) 600 MG 12 hr tablet, Take 1,200 mg by mouth 2 (two) times daily., Disp: , Rfl:    levocetirizine (XYZAL) 5 MG tablet, TAKE 1 TABLET BY MOUTH EVERY EVENING, Disp: 90 tablet, Rfl: 3   montelukast (SINGULAIR) 10 MG tablet, Take 10 mg by mouth every evening., Disp: , Rfl:    sodium chloride (OCEAN) 0.65 % SOLN nasal spray, Place 1 spray into both nostrils daily., Disp: , Rfl:    zolpidem (AMBIEN) 10 MG tablet, TAKE 1 TABLET(10 MG) BY MOUTH AT BEDTIME AS NEEDED FOR SLEEP, Disp: 30 tablet, Rfl: 0  Review of Systems: Denies appetite changes, fevers, chills, fatigue, unexplained weight changes. Denies hearing loss, neck lumps or masses, mouth sores, ringing in ears or voice changes. Denies cough or wheezing.  Denies shortness of breath. Denies chest pain or palpitations. Denies leg swelling. Denies abdominal distention, pain, blood in stools, constipation, diarrhea, nausea, vomiting, or early satiety.  Denies pain with intercourse, dysuria, frequency, hematuria or incontinence. Denies hot flashes, pelvic pain, vaginal bleeding or vaginal discharge.   Denies joint pain, back pain or muscle pain/cramps. Denies itching, rash, or wounds. Denies dizziness, headaches, numbness or seizures. Denies swollen lymph nodes or glands, denies easy bruising or bleeding. Denies anxiety, depression, confusion, or decreased concentration.  Physical Exam: BP 131/70 (BP Location: Left Arm, Patient Position: Sitting)   Pulse 73   Temp 98.3 F (36.8 C) (Oral)   Resp 16   Wt 191 lb (86.6 kg)   LMP 12/18/2015   SpO2 98%   BMI 32.79 kg/m  General: Alert, oriented, no acute distress. HEENT: Normocephalic, atraumatic, sclera anicteric. Chest: Unlabored breathing on room air. Abdomen: soft, nontender.  Normoactive bowel sounds.  No masses or hepatosplenomegaly appreciated.  Well-healed incisions. Extremities: Grossly normal range of motion.  Warm, well  perfused.  No edema bilaterally. Skin: No rashes or lesions noted. GU: Normal appearing external genitalia without erythema, excoriation, or lesions.  Speculum exam reveals cuff intact, suture visible, no blood or discharge.  Bimanual exam reveals cuff intact, no fluctuance or tenderness with palpation.   Laboratory & Radiologic Studies: None new  Assessment & Plan: Brandi Carpenter is a 56 y.o. 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 MMR intact, MSS. P53 negative.   Patient is doing well, meeting postoperative milestones.  Discussed continued expectations and restrictions.   We reviewed final pathology report together.  The patient was given a copy of this.  Given her low risk, early stage disease, no adjuvant therapy is recommended.  Discussed need for surveillance.  We will plan to alternate visits between my office and her OB/GYN.  Per [facility] surveillance recommendations, we will plan on visits every 6 months.  She would like to come here for her first visit.  We reviewed signs and symptoms that should prompt a phone call between visits and would be concerning for possible cancer recurrence.  22 minutes of total time was spent for this patient encounter, including preparation, face-to-face counseling with the patient and coordination of care, and documentation of the encounter.  Jeral Pinch, MD  Division of Gynecologic Oncology  Department of Obstetrics and Gynecology  [facility]

## 2022-08-21 NOTE — Patient Instructions (Signed)
It was good to see you today.  You are healing very well from surgery.  Please remember, no heavy lifting for 6 weeks (nothing more than 10 pounds) nothing in the vagina for at least 8-10.  Based on your final pathology, no additional treatment is needed.  We will have visits every 6 months, planning to alternate these between my office and your OB/GYN.  We will plan for your first visit in 6 months with me.  Between visits, if you develop any of the symptoms that we discussed today (such as bleeding, pelvic pain, change to bowel function, unintentional weight loss), please call to see me sooner.

## 2022-08-24 ENCOUNTER — Telehealth: Payer: Self-pay

## 2022-08-24 NOTE — Telephone Encounter (Signed)
Pt called office regarding a recent visit with Dr. Berline Lopes on Friday 12/8. She states they discussed maybe starting HRT and she thinks that is the route she thinks she wants to go.  CB# [phone]

## 2022-08-24 NOTE — Telephone Encounter (Signed)
Thank you. Could you please verify with the patient whether her plan is to restart estrogen patch she had been taking previously? If so, please remind her that progesterone is not needed.

## 2022-08-25 NOTE — Telephone Encounter (Signed)
Pt states she is fine with restarting the patch, but states Berline Lopes mentioned something about a natural supplement. Ms. Bantz is fine with whichever Berline Lopes thinks is best.

## 2022-09-16 ENCOUNTER — Encounter (INDEPENDENT_AMBULATORY_CARE_PROVIDER_SITE_OTHER): Payer: BC Managed Care – PPO | Admitting: Family Medicine

## 2022-09-16 DIAGNOSIS — Z0289 Encounter for other administrative examinations: Secondary | ICD-10-CM

## 2022-09-29 NOTE — Patient Instructions (Signed)

## 2022-09-29 NOTE — Progress Notes (Signed)
Chief Complaint  Patient presents with   Annual Exam    Fasting annual exam. No new concerns.    Brandi Carpenter is a 57 y.o. female who presents for a complete physical.   She is under the care of Dr. Quincy Simmonds and Dr. Berline Lopes (gyn-onc).  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  She is s/p definitive surgery 07/2022 with no residual carcinoma.  She restarted HRT (estrogen patch) in December due to hot flashes. She denies side effects--notes some joint soreness in her feet/ankle since she restarted this (she had stopped it in October, and didn't notice it again until she restarted the patch).  No nausea, breast tenderness or vaginal discharge.  Denies hot flashes or night sweats.  Depression/Anxiety and insomnia:   She is seeing psychiatrist.  She reports they had reduced the wellbutrin dose, stopped celexa. She is back on the lower dose of citalopram now, doing well. (She had SE of feeling swollen from other meds that had been tried.) She was sleeping well on gabapentin '400mg'$ , only occasionally needing ambien, when on estrogen.  When she stopped the hormones, her sleep got much worse.  Sleep has improved since being back on the patch, not as good as before.  Now needing it every other night, at 1/2 tablet. Ambien was last refilled #30 11/15, prior refill was in August. Last refilled alprazolam #20 in 04/2022, prior fill was in 06/2021.  Uses it mainly for dentalwork.  Restless legs--recurred after gabapentin stopped after her back surgery in 2020.  RLS resolved since restarting gabapentin.   GERD:  She has been taking famotidine once daily (rarely also in the evening, only prn), and that has been controlling her symptoms.  Denies heartburn, hiccups, dysphagia.  Chronic back pain, s/p microdisckectomy 10/2019 by Dr. Saintclair Halsted. Last MRI 03/2020. Prolonged standing, and certain positions aggravate her back pain.  Not needing pain meds. Back has been pretty good,  sometimes flares with yardwork (repetitive bending, to pick up sticks).  Stretches help, sometimes will take advil or aleve,  It could take a week, then back to baseline. Denies numbness, tingling or weakness.  Doesn't do any home exercises or core strengthening.   Allergies:  She is under the care of allergist, getting weekly allergy shots. She is also using Flonase, Xyzal and Singulair, as well as using saline rinses and mucinex.   She is s/p septoplasty and inferior turbinate reductions by Dr. Benjamine Mola in 2017. She saw ENT (Dr. Sabino Gasser) in August 2023 with complaints of nasal congestion. Endoscopy demonstrated significant reduction in normal inferior turbinate tissue (R>L). Improvement in subjective nasal airflow with cotton test consistent with empty nose syndrome. He recommended against treatment with fillers due to potential risks. Keeping it moist with regular use of saline helps.  Weight concerns/gain discussed at October 2023 visit.  She reported not usually eating due to hunger, and that she craves sugar. In October she had just started tracking food again. She continues to track, which counts.  She finally got off the waiting list and has an appointment with [facility] next week. She doesn't get any regular exercise--really only likes yardwork  Wt Readings from Last 3 Encounters:  10/01/22 195 lb (88.5 kg)  08/21/22 191 lb (86.6 kg)  07/30/22 186 lb 15.2 oz (84.8 kg)   Sister has glioblastoma, doing okay.  Had to stop chemo.    Immunization History  Administered Date(s) Administered   Influenza Split 07/14/2014, 04/25/2016   Influenza,  High Dose Seasonal PF 07/18/2021   Influenza,inj,Quad PF,6+ Mos 06/28/2013, 08/01/2015, 06/16/2019, 05/22/2020, 06/17/2022   Influenza-Unspecified 07/06/2017, 06/28/2018   Janssen (J&J) SARS-COV-2 Vaccination 11/23/2019   Moderna Covid-19 Vaccine Bivalent Booster 2yr & up 09/24/2021   Moderna Sars-Covid-2 Vaccination 08/09/2020   Tdap 05/11/2012,  09/24/2021   Zoster Recombinat (Shingrix) 08/25/2018, 10/26/2018   Zoster, Live 04/25/2016   Last Pap smear: 01/2022 AGUS, now s/p hysterectomy (endometrial cancer) Last mammogram: 01/2022 Last colonoscopy: 09/2015 with Dr. JArdis Hughs normal.  Last DEXA: never   Dentist: twice a year Ophtho:  Due, has glasses for distance (prev just used readers).   Exercise:  "not a lot", no cardio or current weight-bearing exercise. Enjoys yardwork, also cares for her mom's yard.  Lipids: Lab Results  Component Value Date   CHOL 229 (H) 09/24/2021   HDL 66 09/24/2021   LDLCALC 137 (H) 09/24/2021   TRIG 150 (H) 09/24/2021   CHOLHDL 3.5 09/24/2021   Vitamin D-OH 67.5 in 04/2018. Pt states vitamins are the same.   PMH, PSH, SH and FH reviewed and updated  Outpatient Encounter Medications as of 10/01/2022  Medication Sig Note   buPROPion (WELLBUTRIN XL) 150 MG 24 hr tablet Take 150 mg by mouth daily.    Cholecalciferol (VITAMIN D) 2000 UNITS tablet Take 2,000 Units by mouth daily.    citalopram (CELEXA) 20 MG tablet Take 20 mg by mouth every evening.    estradiol (VIVELLE-DOT) 0.05 MG/24HR patch Place 1 patch onto the skin 2 (two) times a week.    famotidine (PEPCID) 20 MG tablet Take 20 mg by mouth daily.    fluticasone (FLONASE) 50 MCG/ACT nasal spray Place 2 sprays into both nostrils daily.    gabapentin (NEURONTIN) 400 MG capsule Take 400 mg by mouth every evening.    guaiFENesin (MUCINEX) 600 MG 12 hr tablet Take 1,200 mg by mouth 2 (two) times daily.    levocetirizine (XYZAL) 5 MG tablet TAKE 1 TABLET BY MOUTH EVERY EVENING    montelukast (SINGULAIR) 10 MG tablet Take 10 mg by mouth every evening.    sodium chloride (OCEAN) 0.65 % SOLN nasal spray Place 1 spray into both nostrils daily.    zolpidem (AMBIEN) 10 MG tablet TAKE 1 TABLET(10 MG) BY MOUTH AT BEDTIME AS NEEDED FOR SLEEP 10/01/2022: Takes every other night   ALPRAZolam (XANAX) 0.5 MG tablet TAKE 1/2 TO 1 TABLET BY MOUTH THREE TIMES DAILY  AS NEEDED FOR ANXIETY (Patient not taking: Reported on 10/01/2022) 10/01/2022: prn   diphenhydrAMINE (BENADRYL) 25 MG tablet Take 25 mg by mouth every 6 (six) hours as needed for allergies. (Patient not taking: Reported on 10/01/2022) 10/01/2022: As needed   EPINEPHrine 0.3 mg/0.3 mL IJ SOAJ injection Inject 0.3 mg into the muscle as needed for anaphylaxis. (Patient not taking: Reported on 10/01/2022)    No facility-administered encounter medications on file as of 10/01/2022.   Allergies  Allergen Reactions   Codeine Itching and Nausea And Vomiting   Pamelor [Nortriptyline] Tinitus    ROS: The patient denies headaches, anorexia, fever, vision changes, decreased hearing, ear pain, sore throat, breast concerns, chest pain, palpitations, dizziness, syncope, dyspnea on exertion, cough, swelling, nausea, vomiting, diarrhea, constipation, abdominal pain, melena, hematochezia, hematuria, incontinence, dysuria, vaginal bleeding, discharge, odor or itch, genital lesions, weakness, tremor, suspicious skin lesions, abnormal bleeding/bruising, or enlarged lymph nodes.   Insomnia persists, per HPI.  Moods are good. Hot flashes resolved with HRT Back pain is rare/sporadic RLS resolved Heartburn resolved with pepcid, rare. Denies  hair/skin/bowel changes Sees dermatologist. Slight discomfort in feet/ankles.   PHYSICAL EXAM:  BP 120/80   Pulse 60   Ht '5\' 4"'$  (1.626 m)   Wt 195 lb (88.5 kg)   LMP 12/18/2015   BMI 33.47 kg/m   Wt Readings from Last 3 Encounters:  10/01/22 195 lb (88.5 kg)  08/21/22 191 lb (86.6 kg)  07/30/22 186 lb 15.2 oz (84.8 kg)    General Appearance:    Alert, cooperative, no distress, appears stated age. She did not change into gown for exam today.   Head:    Normocephalic, without obvious abnormality, atraumatic     Eyes:    PERRL, conjunctiva/corneas clear, EOM's intact, fundi benign     Ears:    Normal TM's and external ear canals..  Nose:    Normal, no drainage or sinus  tenderness  Throat:    Normal mucosa  Neck:    Supple, no lymphadenopathy; thyroid: no enlargement/ tenderness/nodules; no carotid bruit or JVD     Back:    Spine nontender, no curvature, ROM normal, no CVA tenderness    Lungs:    Clear to auscultation bilaterally without wheezes, rales or ronchi; respirations unlabored     Chest Wall:    No tenderness or deformity     Heart:    Regular rate and rhythm, S1 and S2 normal, no murmur, rub or gallop     Breast Exam:    Deferred to GYN   Abdomen:    Soft, non-tender, nondistended, normoactive bowel sounds, no masses, no hepatosplenomegaly     Genitalia:    Deferred to GYN   Rectal:    Deferred to GYN   Extremities:    No clubbing, cyanosis or edema.  Pulses:    2+ and symmetric all extremities     Skin:    Skin color, texture, turgor normal, no rashes. (limited exam, not changed into gown)  Lymph nodes:   Cervical, supraclavicular nodes normal    Neurologic:    Normal strength, sensation and gait; reflexes 2+ and symmetric throughout                   Psych:   Normal mood, full range of affect, normal hygiene and grooming      10/01/2022    8:40 AM 09/24/2021    8:44 AM 09/11/2020    8:41 AM 05/22/2020   11:09 AM 08/30/2019   10:58 AM  Depression screen PHQ 2/9  Decreased Interest 0 0 0 0 0  Down, Depressed, Hopeless 0 0 0 0 0  PHQ - 2 Score 0 0 0 0 0  Altered sleeping '1 2   2  '$ Tired, decreased energy 1 2   0  Change in appetite 0 2   0  Feeling bad or failure about yourself  0 3   0  Trouble concentrating 0 0   0  Moving slowly or fidgety/restless 0 2   0  Suicidal thoughts 0 0   0  PHQ-9 Score '2 11   2  '$ Difficult doing work/chores Not difficult at all Somewhat difficult       ASSESSMENT/PLAN:  Annual physical exam  BMI 33.0-33.9,adult - counseled re: diet, exercise.  Has appt at [facility] next week.  No labs--has appt with [facility] next week  Discussed monthly self breast exams and yearly mammograms; at least 30 minutes of aerobic  activity at least 5 days/week, weight-bearing exercise at least 2x/wk; proper sunscreen use reviewed; healthy diet,  including goals of calcium and vitamin D intake and alcohol recommendations (less than or equal to 1 drink/day) reviewed; regular seatbelt use; changing batteries in smoke detectors.  Immunization recommendations discussed--continue yearly flu shots.   Colonoscopy recommendations reviewed--UTD   F/u 1 year CPE

## 2022-10-01 ENCOUNTER — Encounter: Payer: Self-pay | Admitting: Family Medicine

## 2022-10-01 ENCOUNTER — Ambulatory Visit: Payer: BC Managed Care – PPO | Admitting: Family Medicine

## 2022-10-01 VITALS — BP 120/80 | HR 60 | Ht 64.0 in | Wt 195.0 lb

## 2022-10-01 DIAGNOSIS — Z6833 Body mass index (BMI) 33.0-33.9, adult: Secondary | ICD-10-CM

## 2022-10-01 DIAGNOSIS — Z0000 Encounter for general adult medical examination without abnormal findings: Secondary | ICD-10-CM

## 2022-10-06 ENCOUNTER — Ambulatory Visit (INDEPENDENT_AMBULATORY_CARE_PROVIDER_SITE_OTHER): Payer: BC Managed Care – PPO | Admitting: Family Medicine

## 2022-10-06 ENCOUNTER — Encounter (INDEPENDENT_AMBULATORY_CARE_PROVIDER_SITE_OTHER): Payer: Self-pay | Admitting: Family Medicine

## 2022-10-06 VITALS — BP 114/75 | HR 71 | Temp 98.4°F | Ht 64.0 in | Wt 190.0 lb

## 2022-10-06 DIAGNOSIS — R7989 Other specified abnormal findings of blood chemistry: Secondary | ICD-10-CM

## 2022-10-06 DIAGNOSIS — R5383 Other fatigue: Secondary | ICD-10-CM | POA: Diagnosis not present

## 2022-10-06 DIAGNOSIS — F419 Anxiety disorder, unspecified: Secondary | ICD-10-CM | POA: Diagnosis not present

## 2022-10-06 DIAGNOSIS — R0602 Shortness of breath: Secondary | ICD-10-CM | POA: Diagnosis not present

## 2022-10-06 DIAGNOSIS — F32A Depression, unspecified: Secondary | ICD-10-CM

## 2022-10-06 DIAGNOSIS — J301 Allergic rhinitis due to pollen: Secondary | ICD-10-CM

## 2022-10-06 DIAGNOSIS — E66811 Obesity, class 1: Secondary | ICD-10-CM

## 2022-10-06 DIAGNOSIS — E7800 Pure hypercholesterolemia, unspecified: Secondary | ICD-10-CM | POA: Diagnosis not present

## 2022-10-06 DIAGNOSIS — Z6832 Body mass index (BMI) 32.0-32.9, adult: Secondary | ICD-10-CM

## 2022-10-06 DIAGNOSIS — E559 Vitamin D deficiency, unspecified: Secondary | ICD-10-CM

## 2022-10-06 DIAGNOSIS — E669 Obesity, unspecified: Secondary | ICD-10-CM

## 2022-10-07 LAB — COMPREHENSIVE METABOLIC PANEL
ALT: 20 IU/L (ref 0–32)
AST: 15 IU/L (ref 0–40)
Albumin/Globulin Ratio: 1.8 (ref 1.2–2.2)
Albumin: 4.6 g/dL (ref 3.8–4.9)
Alkaline Phosphatase: 92 IU/L (ref 44–121)
BUN/Creatinine Ratio: 9 (ref 9–23)
BUN: 9 mg/dL (ref 6–24)
Bilirubin Total: 0.6 mg/dL (ref 0.0–1.2)
CO2: 26 mmol/L (ref 20–29)
Calcium: 9.9 mg/dL (ref 8.7–10.2)
Chloride: 100 mmol/L (ref 96–106)
Creatinine, Ser: 1 mg/dL (ref 0.57–1.00)
Globulin, Total: 2.6 g/dL (ref 1.5–4.5)
Glucose: 81 mg/dL (ref 70–99)
Potassium: 4.6 mmol/L (ref 3.5–5.2)
Sodium: 141 mmol/L (ref 134–144)
Total Protein: 7.2 g/dL (ref 6.0–8.5)
eGFR: 66 mL/min/{1.73_m2} (ref >59)

## 2022-10-07 LAB — VITAMIN D 25 HYDROXY (VIT D DEFICIENCY, FRACTURES): Vit D, 25-Hydroxy: 45.2 ng/mL (ref 30.0–100.0)

## 2022-10-07 LAB — HEMOGLOBIN A1C
Est. average glucose Bld gHb Est-mCnc: 108 mg/dL
Hgb A1c MFr Bld: 5.4 % (ref 4.8–5.6)

## 2022-10-07 LAB — LIPID PANEL WITH LDL/HDL RATIO
Cholesterol, Total: 222 mg/dL — ABNORMAL HIGH (ref 100–199)
HDL: 63 mg/dL (ref >39)
LDL Chol Calc (NIH): 136 mg/dL — ABNORMAL HIGH (ref 0–99)
LDL/HDL Ratio: 2.2 ratio (ref 0.0–3.2)
Triglycerides: 131 mg/dL (ref 0–149)
VLDL Cholesterol Cal: 23 mg/dL (ref 5–40)

## 2022-10-07 LAB — T4, FREE: Free T4: 1.04 ng/dL (ref 0.82–1.77)

## 2022-10-07 LAB — FOLATE: Folate: 5.3 ng/mL (ref >3.0)

## 2022-10-07 LAB — TSH: TSH: 1.3 u[IU]/mL (ref 0.450–4.500)

## 2022-10-07 LAB — T3: T3, Total: 122 ng/dL (ref 71–180)

## 2022-10-07 LAB — VITAMIN B12: Vitamin B-12: 343 pg/mL (ref 232–1245)

## 2022-10-07 LAB — INSULIN, RANDOM: INSULIN: 7.1 u[IU]/mL (ref 2.6–24.9)

## 2022-10-19 NOTE — Progress Notes (Signed)
Chief Complaint:   OBESITY Brandi Carpenter (MR# [id number]) is a 57 y.o. female who presents for evaluation and treatment of obesity and related comorbidities. Current BMI is Body mass index is 33.47 kg/m. [name] has been struggling with her weight for many years and has been unsuccessful in either losing weight, maintaining weight loss, or reaching her healthy weight goal.  Elzie Rings referred by her sister.  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  Lives at home with partner Juliann Pulse).  Previously he tracked calories and goal was around 1250/day, (reality 1350-1400 cal a day).  Skips lunch very fairly consistently.  Diet [facility] in the a.m., Cinnamon Raisin toast (2) with butter, (satisfied) or Kodiak crunchy granola bar, (satisfied).  30 minutes-2 hours later eating pretzels or cheese- its(1 serving from label).  May then have the opposite snack or PB crackers or snack size candy.  Take out, Ghassans, Single chicken skewer, fries and salad.  Then sweet ginger snaps 4-5 or Oreos 3-4.  Pallas is currently in the action stage of change and ready to dedicate time achieving and maintaining a healthier weight. Malanie is interested in becoming our patient and working on intensive lifestyle modifications including (but not limited to) diet and exercise for weight loss.  Kailen's habits were reviewed today and are as follows: her desired weight loss is 38 pounds, she started gaining weight 20 years ago, her heaviest weight ever was 190 pounds, she has significant food cravings issues, she snacks frequently in the evenings, she skips meals frequently, she frequently makes poor food choices, she frequently eats larger portions than normal, and she struggles with emotional eating.  Depression Screen Raylan's Food and Mood (modified PHQ-9) score was 11.     10/01/2022    8:40 AM  Depression screen PHQ 2/9  Decreased Interest 0  Down, Depressed, Hopeless 0  PHQ - 2 Score 0   Altered sleeping 1  Tired, decreased energy 1  Change in appetite 0  Feeling bad or failure about yourself  0  Trouble concentrating 0  Moving slowly or fidgety/restless 0  Suicidal thoughts 0  PHQ-9 Score 2  Difficult doing work/chores Not difficult at all   Subjective:   1. Other fatigue Stellah admits to daytime somnolence and admits to waking up still tired. Patient has a history of symptoms of morning fatigue. [name] generally gets  6.5-8  hours of sleep per night, and states that she has difficulty falling asleep and nightime awakenings. Snoring is present. Apneic episodes are not present. Epworth Sleepiness Score is 2.    2. SOBOE (shortness of breath on exertion) Jamice notes increasing shortness of breath with exercising and seems to be worsening over time with weight gain. She notes getting out of breath sooner with activity than she used to. This has not gotten worse recently. Kalima denies shortness of breath at rest or orthopnea.   3. Elevated serum creatinine Previously elevated.  4. Pure hypercholesterolemia Last LDL of 137, HDL of 66, Trigly of 150.  Not on medications.  5. Allergic rhinitis due to pollen, unspecified seasonality On Flonase, Xyzal and Singulair.  Symptoms well managed.  6. Vitamin D deficiency On 2K IU daily.  Notes fatigue.  7. Anxiety and depression On Wellbutrin, Celexa, as needed Xanax.  Denies suicidal ideas, and homicidal ideas.  Assessment/Plan:   1. Other fatigue We will obtain labs today.  Alxis does feel that her weight is causing her energy to be lower than  it should be. Fatigue may be related to obesity, depression or many other causes. Labs will be ordered, and in the meanwhile, [name] will focus on self care including making healthy food choices, increasing physical activity and focusing on stress reduction.  - EKG 12-Lead  - Vitamin B12 - Folate - Hemoglobin A1c - Insulin, random - TSH - T4, free - T3  2.  SOBOE (shortness of breath on exertion) [name] does feel that she gets out of breath more easily that she used to when she exercises. [name]'s shortness of breath appears to be obesity related and exercise induced. She has agreed to work on weight loss and gradually increase exercise to treat her exercise induced shortness of breath. Will continue to monitor closely.   3. Elevated serum creatinine We will obtain labs today.  - Comprehensive metabolic panel  4. Pure hypercholesterolemia We will obtain labs today.  Repeat in 3 months.  - Lipid Panel With LDL/HDL Ratio  5. Allergic rhinitis due to pollen, unspecified seasonality Will follow up on symptoms at next appointment.  6. Vitamin D deficiency We will obtain labs today.  - VITAMIN D 25 Hydroxy (Vit-D Deficiency, Fractures)  7. Anxiety and depression Will follow up on symptoms mentioned above at next appointment.  8. Class 1 obesity with serious comorbidity and body mass index (BMI) of 32.0 to 32.9 in adult, unspecified obesity type [name] is currently in the action stage of change and her goal is to continue with weight loss efforts. I recommend [name] begin the structured treatment plan as follows:  She has agreed to the Category 2 Plan +100.  Exercise goals: No exercise has been prescribed at this time.   Behavioral modification strategies: increasing lean protein intake, meal planning and cooking strategies, keeping healthy foods in the home, and planning for success.  She was informed of the importance of frequent follow-up visits to maximize her success with intensive lifestyle modifications for her multiple health conditions. She was informed we would discuss her lab results at her next visit unless there is a critical issue that needs to be addressed sooner. [name] agreed to keep her next visit at the agreed upon time to discuss these results.  Objective:   Blood pressure 114/75, pulse 71, temperature 98.4 F  (36.9 C), height 5' 4"$  (1.626 m), last menstrual period 12/18/2015, SpO2 97 %. Body mass index is 33.47 kg/m.  EKG: Normal sinus rhythm, rate 64 bpm.  Indirect Calorimeter completed today shows a VO2 of 239 ml and a REE of 1642.  Her calculated basal metabolic rate is [id number] thus her basal metabolic rate is better than expected.  General: Cooperative, alert, well developed, in no acute distress. HEENT: Conjunctivae and lids unremarkable. Cardiovascular: Regular rhythm.  Lungs: Normal work of breathing. Neurologic: No focal deficits.   Lab Results  Component Value Date   CREATININE 1.00 10/06/2022   BUN 9 10/06/2022   NA 141 10/06/2022   K 4.6 10/06/2022   CL 100 10/06/2022   CO2 26 10/06/2022   Lab Results  Component Value Date   ALT 20 10/06/2022   AST 15 10/06/2022   ALKPHOS 92 10/06/2022   BILITOT 0.6 10/06/2022   Lab Results  Component Value Date   HGBA1C 5.4 10/06/2022   Lab Results  Component Value Date   INSULIN 7.1 10/06/2022   Lab Results  Component Value Date   TSH 1.300 10/06/2022   Lab Results  Component Value Date   CHOL 222 (H) 10/06/2022  HDL 63 10/06/2022   LDLCALC 136 (H) 10/06/2022   TRIG 131 10/06/2022   CHOLHDL 3.5 09/24/2021   Lab Results  Component Value Date   WBC 7.2 07/21/2022   HGB 13.3 07/21/2022   HCT 39.6 07/21/2022   MCV 89.8 07/21/2022   PLT 256 07/21/2022   No results found for: "IRON", "TIBC", "FERRITIN"  Attestation Statements:   Reviewed by clinician on day of visit: allergies, medications, problem list, medical history, surgical history, family history, social history, and previous encounter notes.  Time spent on visit including pre-visit chart review and post-visit charting and care was 45 minutes.   I, Elnora Morrison, RMA am acting as transcriptionist for Coralie Common, MD. This is the patient's first visit at [facility]. [name] NEW PATIENT PACKET was reviewed at length. Included in the  packet: current and past health history, medications, allergies, ROS, gynecologic history (women only), surgical history, family history, social history, weight history, weight loss surgery history (for those that have had weight loss surgery), nutritional evaluation, mood and food questionnaire, PHQ9, Epworth questionnaire, sleep habits questionnaire, patient life and health improvement goals questionnaire. These will all be scanned into the patient's chart under media.   During the visit, I independently reviewed the patient's EKG, bioimpedance scale results, and indirect calorimeter results. I used this information to tailor a meal plan for the patient that will help her to lose weight and will improve her obesity-related conditions going forward. I performed a medically necessary appropriate examination and/or evaluation. I discussed the assessment and treatment plan with the patient. The patient was provided an opportunity to ask questions and all were answered. The patient agreed with the plan and demonstrated an understanding of the instructions. Labs were ordered at this visit and will be reviewed at the next visit unless more critical results need to be addressed immediately. Clinical information was updated and documented in the EMR.    I have reviewed the above documentation for accuracy and completeness, and I agree with the above. - Coralie Common, MD

## 2022-10-20 ENCOUNTER — Ambulatory Visit (INDEPENDENT_AMBULATORY_CARE_PROVIDER_SITE_OTHER): Payer: BC Managed Care – PPO | Admitting: Family Medicine

## 2022-10-20 ENCOUNTER — Encounter (INDEPENDENT_AMBULATORY_CARE_PROVIDER_SITE_OTHER): Payer: Self-pay | Admitting: Family Medicine

## 2022-10-20 VITALS — BP 120/82 | HR 64 | Temp 98.2°F | Ht 64.0 in | Wt 185.0 lb

## 2022-10-20 DIAGNOSIS — Z6831 Body mass index (BMI) 31.0-31.9, adult: Secondary | ICD-10-CM

## 2022-10-20 DIAGNOSIS — E669 Obesity, unspecified: Secondary | ICD-10-CM

## 2022-10-20 DIAGNOSIS — E7849 Other hyperlipidemia: Secondary | ICD-10-CM

## 2022-10-20 DIAGNOSIS — E559 Vitamin D deficiency, unspecified: Secondary | ICD-10-CM

## 2022-10-20 NOTE — Progress Notes (Deleted)
Patient is surprised how it feels to feel full.  She ws never one to eat lunch and is now able to get to dinner easier.  Got Covid last Tuesday and took 5 days of Paxlovid so those days she was off the meal plan the most due to not feeling well. Had significantly less desires for sweets and indulgences after and between meals. Some days was able to get all food in on plan but maybe not always getting fruit in.  Occasionally not getting snacks in.  Her sister started back on plan and at this clinic.  Patient does not voice any changes that need to be made to the meal plan for the next two weeks.  No upcoming plans for the next two weeks in terms of events, activities or travel.

## 2022-11-03 NOTE — Progress Notes (Signed)
Chief Complaint:   OBESITY Brandi Carpenter is here to discuss her progress with her obesity treatment plan along with follow-up of her obesity related diagnoses. Brandi Carpenter is on the Category 2 Plan and states she is following her eating plan approximately 85% of the time. Brandi Carpenter states she is exercising 0 minutes 0 times per week.  Today's visit was #: 2 Starting weight: 190 lbs Starting date: 10/06/2022 Today's weight: 185 lbs Today's date: 10/20/2022 Total lbs lost to date: 5 lbs Total lbs lost since last in-office visit: 5  Interim History: Brandi Carpenter is surprised how it feels to feel full.  She ws never one to eat lunch and is now able to get to dinner easier.  Got Covid last Tuesday and took 5 days of Paxlovid so those days she was off the meal plan the most due to not feeling well. Had significantly less desires for sweets and indulgences after and between meals. Some days was able to get all food in on plan but maybe not always getting fruit in.  Occasionally not getting snacks in.  Her sister started back on plan and at this clinic.  Patient does not voice any changes that need to be made to the meal plan for the next two weeks.  No upcoming plans for the next two weeks in terms of events, activities or travel.  Subjective:   1. Vitamin D deficiency On 2K IU daily.  Denies any nausea, vomiting or muscle weakness.  She notes fatigue.  2. Other hyperlipidemia LDL elvated HDL 63, Trigly 131.  Not on medications.  Assessment/Plan:   1. Vitamin D deficiency Increase Vit D to 5k IU daily over the counter.  2. Other hyperlipidemia Repeat at labs in 3 months.  3. Obesity with current BMI of 31.9 Brandi Carpenter is currently in the action stage of change. As such, her goal is to continue with weight loss efforts. She has agreed to the Category 2 Plan.   Exercise goals: No exercise has been prescribed at this time.  Behavioral modification strategies: increasing lean protein intake, meal  planning and cooking strategies, keeping healthy foods in the home, and planning for success.  Brandi Carpenter has agreed to follow-up with our clinic in 2 weeks. She was informed of the importance of frequent follow-up visits to maximize her success with intensive lifestyle modifications for her multiple health conditions.   Objective:   Blood pressure 120/82, pulse 64, temperature 98.2 F (36.8 C), height '5\' 4"'$  (1.626 m), weight 185 lb (83.9 kg), last menstrual period 12/18/2015, SpO2 100 %. Body mass index is 31.76 kg/m.  General: Cooperative, alert, well developed, in no acute distress. HEENT: Conjunctivae and lids unremarkable. Cardiovascular: Regular rhythm.  Lungs: Normal work of breathing. Neurologic: No focal deficits.   Lab Results  Component Value Date   CREATININE 1.00 10/06/2022   BUN 9 10/06/2022   NA 141 10/06/2022   K 4.6 10/06/2022   CL 100 10/06/2022   CO2 26 10/06/2022   Lab Results  Component Value Date   ALT 20 10/06/2022   AST 15 10/06/2022   ALKPHOS 92 10/06/2022   BILITOT 0.6 10/06/2022   Lab Results  Component Value Date   HGBA1C 5.4 10/06/2022   Lab Results  Component Value Date   INSULIN 7.1 10/06/2022   Lab Results  Component Value Date   TSH 1.300 10/06/2022   Lab Results  Component Value Date   CHOL 222 (H) 10/06/2022   HDL 63 10/06/2022   [place]  136 (H) 10/06/2022   TRIG 131 10/06/2022   CHOLHDL 3.5 09/24/2021   Lab Results  Component Value Date   VD25OH 45.2 10/06/2022   VD25OH 67.5 04/20/2018   VD25OH 59 08/12/2016   Lab Results  Component Value Date   WBC 7.2 07/21/2022   HGB 13.3 07/21/2022   HCT 39.6 07/21/2022   MCV 89.8 07/21/2022   PLT 256 07/21/2022   No results found for: "IRON", "TIBC", "FERRITIN"  Attestation Statements:   Reviewed by clinician on day of visit: allergies, medications, problem list, medical history, surgical history, family history, social history, and previous encounter notes.  Time spent on  visit including pre-visit chart review and post-visit care and charting was 45 minutes.   I, Elnora Morrison, RMA am acting as transcriptionist for Coralie Common, MD.  I have reviewed the above documentation for accuracy and completeness, and I agree with the above. - Coralie Common, MD

## 2022-11-10 ENCOUNTER — Encounter (INDEPENDENT_AMBULATORY_CARE_PROVIDER_SITE_OTHER): Payer: Self-pay | Admitting: Family Medicine

## 2022-11-10 ENCOUNTER — Ambulatory Visit (INDEPENDENT_AMBULATORY_CARE_PROVIDER_SITE_OTHER): Payer: BC Managed Care – PPO | Admitting: Family Medicine

## 2022-11-10 VITALS — BP 132/79 | HR 61 | Temp 97.8°F | Ht 64.0 in | Wt 189.0 lb

## 2022-11-10 DIAGNOSIS — Z6832 Body mass index (BMI) 32.0-32.9, adult: Secondary | ICD-10-CM | POA: Diagnosis not present

## 2022-11-10 DIAGNOSIS — E669 Obesity, unspecified: Secondary | ICD-10-CM | POA: Diagnosis not present

## 2022-11-10 DIAGNOSIS — E559 Vitamin D deficiency, unspecified: Secondary | ICD-10-CM | POA: Diagnosis not present

## 2022-11-10 NOTE — Progress Notes (Deleted)
Patient has had typical last few weeks- work and life.  Did have some Valentine's Day candy. She is still mostly following Category 2.  Breakfast is easy, lunch is mostly on plan and dinner is on plan but not getting all food in.  She is finding that she is eating on plan she just isn't getting all food of meal and not getting snacks in.  Hasn't tried moving some of the food around to get it in earlier in the day.  She is craving chocolate or something sweet.  Probably getting between 4-8oz; 6oz average at supper.  No upcoming plans for activities, events or travel.

## 2022-11-18 ENCOUNTER — Encounter: Payer: Self-pay | Admitting: Family Medicine

## 2022-11-18 DIAGNOSIS — G4700 Insomnia, unspecified: Secondary | ICD-10-CM

## 2022-11-18 MED ORDER — ZOLPIDEM TARTRATE 10 MG PO TABS: ORAL_TABLET | ORAL | 0 refills | Status: DC

## 2022-11-24 ENCOUNTER — Encounter (INDEPENDENT_AMBULATORY_CARE_PROVIDER_SITE_OTHER): Payer: Self-pay | Admitting: Family Medicine

## 2022-11-24 ENCOUNTER — Ambulatory Visit (INDEPENDENT_AMBULATORY_CARE_PROVIDER_SITE_OTHER): Payer: BC Managed Care – PPO | Admitting: Family Medicine

## 2022-11-24 VITALS — BP 128/84 | HR 64 | Temp 97.8°F | Ht 64.0 in | Wt 186.0 lb

## 2022-11-24 DIAGNOSIS — E669 Obesity, unspecified: Secondary | ICD-10-CM

## 2022-11-24 DIAGNOSIS — E559 Vitamin D deficiency, unspecified: Secondary | ICD-10-CM | POA: Diagnosis not present

## 2022-11-24 DIAGNOSIS — Z6831 Body mass index (BMI) 31.0-31.9, adult: Secondary | ICD-10-CM | POA: Diagnosis not present

## 2022-11-24 NOTE — Progress Notes (Signed)
Chief Complaint:   OBESITY Brandi Carpenter is here to discuss her progress with her obesity treatment plan along with follow-up of her obesity related diagnoses. Brandi Carpenter is on the Category 2 Plan and states she is following her eating plan approximately 75% of the time. Brandi Carpenter states she is yard work 1 to 2 hours once per week.   Today's visit was #: 3 Starting weight: 190 LBS Starting date: 10/06/2022 Today's weight: 185 LBS Today's date: 11/10/2022 Total lbs lost to date: 5 LBS Total lbs lost since last in-office visit: +4 LBS  Interim History: Patient has had typical last few weeks- work and life. Did have some Valentine's Day candy. She is still mostly following Category 2. Breakfast is easy, lunch is mostly on plan and dinner is on plan but not getting all food in. She is finding that she is eating on plan she just isn't getting all food of meal and not getting snacks in. Hasn't tried moving some of the food around to get it in earlier in the day. She is craving chocolate or something sweet. Probably getting between 4-8oz; 6oz average at supper. No upcoming plans for activities, events or travel.   Subjective:   1. Vitamin D deficiency Patient is on OTC vitamin D, positive for fatigue.  Assessment/Plan:   1. Vitamin D deficiency Continue OTC vitamin D, repeat labs in 3 months.  2. Obesity with current BMI of 32.5 Tierny is currently in the action stage of change. As such, her goal is to continue with weight loss efforts. She has agreed to the Category 2 Plan and keeping a food journal and adhering to recommended goals of 1200-1300 calories and 85+ protein daily.  Exercise goals: All adults should avoid inactivity. Some physical activity is better than none, and adults who participate in any amount of physical activity gain some health benefits.  Behavioral modification strategies: increasing lean protein intake, meal planning and cooking strategies, keeping healthy foods in the  home, and planning for success.  Adalind has agreed to follow-up with our clinic in 2 weeks. She was informed of the importance of frequent follow-up visits to maximize her success with intensive lifestyle modifications for her multiple health conditions.   Objective:   Blood pressure 132/79, pulse 61, temperature 97.8 F (36.6 C), height '5\' 4"'$  (1.626 m), weight 189 lb (85.7 kg), last menstrual period 12/18/2015, SpO2 100 %. Body mass index is 32.44 kg/m.  General: Cooperative, alert, well developed, in no acute distress. HEENT: Conjunctivae and lids unremarkable. Cardiovascular: Regular rhythm.  Lungs: Normal work of breathing. Neurologic: No focal deficits.   Lab Results  Component Value Date   CREATININE 1.00 10/06/2022   BUN 9 10/06/2022   NA 141 10/06/2022   K 4.6 10/06/2022   CL 100 10/06/2022   CO2 26 10/06/2022   Lab Results  Component Value Date   ALT 20 10/06/2022   AST 15 10/06/2022   ALKPHOS 92 10/06/2022   BILITOT 0.6 10/06/2022   Lab Results  Component Value Date   HGBA1C 5.4 10/06/2022   Lab Results  Component Value Date   INSULIN 7.1 10/06/2022   Lab Results  Component Value Date   TSH 1.300 10/06/2022   Lab Results  Component Value Date   CHOL 222 (H) 10/06/2022   HDL 63 10/06/2022   LDLCALC 136 (H) 10/06/2022   TRIG 131 10/06/2022   CHOLHDL 3.5 09/24/2021   Lab Results  Component Value Date   VD25OH 45.2 10/06/2022  VD25OH 67.5 04/20/2018   VD25OH 59 08/12/2016   Lab Results  Component Value Date   WBC 7.2 07/21/2022   HGB 13.3 07/21/2022   HCT 39.6 07/21/2022   MCV 89.8 07/21/2022   PLT 256 07/21/2022   No results found for: "IRON", "TIBC", "FERRITIN"  Attestation Statements:   Reviewed by clinician on day of visit: allergies, medications, problem list, medical history, surgical history, family history, social history, and previous encounter notes.  Time spent on visit including pre-visit chart review and post-visit care  and charting was 22 minutes.   I, Davy Pique, RMA, am acting as transcriptionist for Coralie Common, MD.  I have reviewed the above documentation for accuracy and completeness, and I agree with the above. - Coralie Common, MD

## 2022-11-24 NOTE — Progress Notes (Deleted)
Patient voices she has been up to mostly the usual over the last few weeks.  She has been outside more as the weather has gotten warmer.  Foodwise she has added in a protein shake daily and that has helped a lot in terms of getting protein intake up and not feeling too full.  Dinner she is shooting for [employer] with a protein shake.  Lunch is a problem- sometimes she eats [place] sandwich and sometimes tuna.  She is keeping track of what she is eating more religiously.  She knows she is getting between 1250-1350 calories but she is getting around 60g of protein daily.  She is logging on [employer].

## 2022-12-02 NOTE — Progress Notes (Signed)
Chief Complaint:   OBESITY Brandi Carpenter is here to discuss her progress with her obesity treatment plan along with follow-up of her obesity related diagnoses. Brandi Carpenter is on the Category 2 Plan and states she is following her eating plan approximately 75% of the time. Brandi Carpenter states she is elliptical 25 minutes 2 times per week.  Today's visit was #: 4 Starting weight: 190 LBS Starting date: 10/06/2022 Today's weight: 186 LBS Today's date: 12/02/2022 Total lbs lost to date: 4 LBS Total lbs lost since last in-office visit: 3 LBS  Interim History:  Patient voices she has been up to mostly the usual over the last few weeks.  She has been outside more as the weather has gotten warmer.  Foodwise she has added in a protein shake daily and that has helped a lot in terms of getting protein intake up and not feeling too full.  Dinner she is shooting for [employer] with a protein shake.  Lunch is a problem- sometimes she eats [place] sandwich and sometimes tuna.  She is keeping track of what she is eating more religiously.  She knows she is getting between 1250-1350 calories but she is getting around 60g of protein daily.  She is logging on [employer].    Subjective:   1. Vitamin D deficiency Patient is on OTC vitamin D.  Patient is positive for fatigue.  Assessment/Plan:   1. Vitamin D deficiency Continue vitamin D OTC.  2. Obesity with current BMI of 31.9 Brandi Carpenter is currently in the action stage of change. As such, her goal is to continue with weight loss efforts. She has agreed to the Category 2 Plan.   Exercise goals: All adults should avoid inactivity. Some physical activity is better than none, and adults who participate in any amount of physical activity gain some health benefits.  Behavioral modification strategies: increasing lean protein intake, meal planning and cooking strategies, keeping healthy foods in the home, and planning for success.  Brandi Carpenter has agreed to follow-up with our  clinic in 2 weeks. She was informed of the importance of frequent follow-up visits to maximize her success with intensive lifestyle modifications for her multiple health conditions.   Objective:   Blood pressure 128/84, pulse 64, temperature 97.8 F (36.6 C), height 5\' 4"  (1.626 m), weight 186 lb (84.4 kg), last menstrual period 12/18/2015, SpO2 100 %. Body mass index is 31.93 kg/m.  General: Cooperative, alert, well developed, in no acute distress. HEENT: Conjunctivae and lids unremarkable. Cardiovascular: Regular rhythm.  Lungs: Normal work of breathing. Neurologic: No focal deficits.   Lab Results  Component Value Date   CREATININE 1.00 10/06/2022   BUN 9 10/06/2022   NA 141 10/06/2022   K 4.6 10/06/2022   CL 100 10/06/2022   CO2 26 10/06/2022   Lab Results  Component Value Date   ALT 20 10/06/2022   AST 15 10/06/2022   ALKPHOS 92 10/06/2022   BILITOT 0.6 10/06/2022   Lab Results  Component Value Date   HGBA1C 5.4 10/06/2022   Lab Results  Component Value Date   INSULIN 7.1 10/06/2022   Lab Results  Component Value Date   TSH 1.300 10/06/2022   Lab Results  Component Value Date   CHOL 222 (H) 10/06/2022   HDL 63 10/06/2022   LDLCALC 136 (H) 10/06/2022   TRIG 131 10/06/2022   CHOLHDL 3.5 09/24/2021   Lab Results  Component Value Date   VD25OH 45.2 10/06/2022   VD25OH 67.5 04/20/2018   VD25OH  59 08/12/2016   Lab Results  Component Value Date   WBC 7.2 07/21/2022   HGB 13.3 07/21/2022   HCT 39.6 07/21/2022   MCV 89.8 07/21/2022   PLT 256 07/21/2022   No results found for: "IRON", "TIBC", "FERRITIN"  Attestation Statements:   Reviewed by clinician on day of visit: allergies, medications, problem list, medical history, surgical history, family history, social history, and previous encounter notes.  I, Davy Pique, RMA, am acting as transcriptionist for Coralie Common, MD. I have reviewed the above documentation for accuracy and completeness,  and I agree with the above. - Coralie Common, MD

## 2022-12-10 ENCOUNTER — Encounter (INDEPENDENT_AMBULATORY_CARE_PROVIDER_SITE_OTHER): Payer: Self-pay | Admitting: Family Medicine

## 2022-12-10 ENCOUNTER — Ambulatory Visit (INDEPENDENT_AMBULATORY_CARE_PROVIDER_SITE_OTHER): Payer: BC Managed Care – PPO | Admitting: Family Medicine

## 2022-12-10 VITALS — BP 119/77 | HR 66 | Temp 97.9°F | Ht 64.0 in | Wt 185.0 lb

## 2022-12-10 DIAGNOSIS — E669 Obesity, unspecified: Secondary | ICD-10-CM

## 2022-12-10 DIAGNOSIS — Z6831 Body mass index (BMI) 31.0-31.9, adult: Secondary | ICD-10-CM

## 2022-12-10 DIAGNOSIS — E66811 Obesity, class 1: Secondary | ICD-10-CM

## 2022-12-10 DIAGNOSIS — J302 Other seasonal allergic rhinitis: Secondary | ICD-10-CM | POA: Diagnosis not present

## 2022-12-10 NOTE — Progress Notes (Signed)
Chief Complaint:   OBESITY Brandi Carpenter is here to discuss her progress with her obesity treatment plan along with follow-up of her obesity related diagnoses. Brandi Carpenter is on the Category 2 Plan and states she is following her eating plan approximately 75% of the time. Brandi Carpenter states she is exercising.  Today's visit was #: 5 Starting weight: 190 LBS Starting date: 10/06/2022 Today's weight: 185 LBS Today's date: 12/10/2022 Total lbs lost to date: 5 LBS Total lbs lost since last in-office visit: 1 LB  Interim History: Over the last few weeks she has been trying to focus more on meal plan and has been consistently working.  She has been doing more yardwork.  She is getting calories in consistently and recognizes when she doesn't have food ready she will snack more consistently.  Food is in the house but sometimes she doesn't make the food in time before it goes back. Substituted [place] and eggs for the ground beef. For Easter she is seeing family.    Subjective:   1. Seasonal allergies Patient is on Flonase and Xyzal.  Symptoms relatively controlled.  Assessment/Plan:   1. Seasonal allergies Continue above medications, no change in treatment.  2. Obesity with current BMI of 31.8 Brandi Carpenter is currently in the action stage of change. As such, her goal is to continue with weight loss efforts. She has agreed to the Category 2 Plan.   Exercise goals: All adults should avoid inactivity. Some physical activity is better than none, and adults who participate in any amount of physical activity gain some health benefits.  Discussed initial exercise the next appointment.  Behavioral modification strategies: increasing lean protein intake, meal planning and cooking strategies, keeping healthy foods in the home, and planning for success.  Brandi Carpenter has agreed to follow-up with our clinic in 3-4 weeks. She was informed of the importance of frequent follow-up visits to maximize her success with  intensive lifestyle modifications for her multiple health conditions.   Objective:   Blood pressure 119/77, pulse 66, temperature 97.9 F (36.6 C), height 5\' 4"  (1.626 m), weight 185 lb (83.9 kg), last menstrual period 12/18/2015. Body mass index is 31.76 kg/m.  General: Cooperative, alert, well developed, in no acute distress. HEENT: Conjunctivae and lids unremarkable. Cardiovascular: Regular rhythm.  Lungs: Normal work of breathing. Neurologic: No focal deficits.   Lab Results  Component Value Date   CREATININE 1.00 10/06/2022   BUN 9 10/06/2022   NA 141 10/06/2022   K 4.6 10/06/2022   CL 100 10/06/2022   CO2 26 10/06/2022   Lab Results  Component Value Date   ALT 20 10/06/2022   AST 15 10/06/2022   ALKPHOS 92 10/06/2022   BILITOT 0.6 10/06/2022   Lab Results  Component Value Date   HGBA1C 5.4 10/06/2022   Lab Results  Component Value Date   INSULIN 7.1 10/06/2022   Lab Results  Component Value Date   TSH 1.300 10/06/2022   Lab Results  Component Value Date   CHOL 222 (H) 10/06/2022   HDL 63 10/06/2022   LDLCALC 136 (H) 10/06/2022   TRIG 131 10/06/2022   CHOLHDL 3.5 09/24/2021   Lab Results  Component Value Date   VD25OH 45.2 10/06/2022   VD25OH 67.5 04/20/2018   VD25OH 59 08/12/2016   Lab Results  Component Value Date   WBC 7.2 07/21/2022   HGB 13.3 07/21/2022   HCT 39.6 07/21/2022   MCV 89.8 07/21/2022   PLT 256 07/21/2022   No results  found for: "IRON", "TIBC", "FERRITIN"  Attestation Statements:   Reviewed by clinician on day of visit: allergies, medications, problem list, medical history, surgical history, family history, social history, and previous encounter notes.  I, Malcolm Metro, RMA, am acting as transcriptionist for Reuben Likes, MD.  I have reviewed the above documentation for accuracy and completeness, and I agree with the above. - Reuben Likes, MD

## 2022-12-25 ENCOUNTER — Other Ambulatory Visit: Payer: Self-pay | Admitting: Obstetrics and Gynecology

## 2022-12-25 DIAGNOSIS — Z1231 Encounter for screening mammogram for malignant neoplasm of breast: Secondary | ICD-10-CM

## 2023-01-05 ENCOUNTER — Ambulatory Visit (INDEPENDENT_AMBULATORY_CARE_PROVIDER_SITE_OTHER): Payer: BC Managed Care – PPO | Admitting: Physician Assistant

## 2023-01-05 ENCOUNTER — Encounter (INDEPENDENT_AMBULATORY_CARE_PROVIDER_SITE_OTHER): Payer: Self-pay | Admitting: Physician Assistant

## 2023-01-05 VITALS — BP 127/80 | HR 64 | Temp 98.5°F | Ht 64.0 in | Wt 185.0 lb

## 2023-01-05 DIAGNOSIS — Z6831 Body mass index (BMI) 31.0-31.9, adult: Secondary | ICD-10-CM

## 2023-01-05 DIAGNOSIS — E559 Vitamin D deficiency, unspecified: Secondary | ICD-10-CM | POA: Insufficient documentation

## 2023-01-05 DIAGNOSIS — F3289 Other specified depressive episodes: Secondary | ICD-10-CM | POA: Diagnosis not present

## 2023-01-05 DIAGNOSIS — E669 Obesity, unspecified: Secondary | ICD-10-CM

## 2023-01-05 DIAGNOSIS — F32A Depression, unspecified: Secondary | ICD-10-CM | POA: Insufficient documentation

## 2023-01-05 MED ORDER — TOPIRAMATE 25 MG PO TABS: 25.0000 mg | ORAL_TABLET | Freq: Every day | ORAL | 0 refills | Status: DC

## 2023-01-05 NOTE — Assessment & Plan Note (Signed)
Other depression/emotional eating Brandi Carpenter has had issues with stress/emotional eating. Currently this is poorly controlled. Overall mood is stable. Medication(s): Bupropion SR 150 mg daily in am for many years without side effects.  Endorses cravings in afternoons/evenings.  We discussed starting topiramate for cravings including risks, benefits and possible side effects and she is agreeable to start topiramate at this time.  Patient denies history of glaucoma or kidney stones. She was informed of side effects and that topiramate is teratogenic. She is S/P hysterectomy.  Plan: Start Topiramate 25 mg at bedtime daily x 1 week and then increase to 50 mg at bedtime nightly if tolerated well.  Consider referral to Dr. Dewaine Conger at next in office visit.

## 2023-01-05 NOTE — Assessment & Plan Note (Signed)
Vitamin D Deficiency Vitamin D is not at goal of 50.  Most recent vitamin D level was 45.2. She is on OTC vitamin D3 2000 IU daily. Lab Results  Component Value Date   VD25OH 45.2 10/06/2022   VD25OH 67.5 04/20/2018   VD25OH 59 08/12/2016    Plan: Continue OTC vitamin D3 2000 IU daily Low vitamin D levels can be associated with adiposity and may result in leptin resistance and weight gain. Also associated with fatigue. Currently on vitamin D supplementation without any adverse effects.

## 2023-01-05 NOTE — Progress Notes (Signed)
Office: [phone]  /  Fax: [phone]  WEIGHT SUMMARY AND BIOMETRICS  Vitals Temp: 98.5 F (36.9 C) BP: 127/80 Pulse Rate: 64 SpO2: 99 %   Anthropometric Measurements Height: 5\' 4"  (1.626 m) Weight: 185 lb (83.9 kg) BMI (Calculated): 31.74 Weight at Last Visit: 185 lb Weight Lost Since Last Visit: 0 lb Weight Gained Since Last Visit: 0 lb   Body Composition  Body Fat %: 41.3 % Fat Mass (lbs): 76.8 lbs Muscle Mass (lbs): 103.4 lbs Total Body Water (lbs): 76.4 lbs Visceral Fat Rating : 11   Other Clinical Data Fasting: no Labs: No Today's Visit #: 6 Starting Date: 10/06/22     HPI  Chief Complaint: OBESITY  Brandi Carpenter is here to discuss her progress with her obesity treatment plan. She is on the the Category 2 Plan and states she is following her eating plan approximately 65 % of the time. She states she is exercising 30 minutes 2 times per week.   Interval History:  Since last office visit she has maintained. Frustrated by slow progress.  Discussed switching to low carbohydrate plan to reset/resume weight loss and patient agreeable to trial of low carbohydrate plan.  Hunger/appetite not always controlled.  Reports cravings for sweets/candy particularly in afternoons and evening. We discussed starting topiramate for cravings including risks, benefits and possible side effects and she is agreeable to start topiramate at this time.  Patient denies history of glaucoma or kidney stones. She was informed of side effects and that topiramate is teratogenic. She is S/P hysterectomy.  Sleep- improved as continues on HRT following hysterectomy.  Exercise- working outside doing Present[employer], [employer].   Pharmacotherapy: Discussed starting topiramate to help with craving/emotional eating behavior  PHYSICAL EXAM:  Blood pressure 127/80, pulse 64, temperature 98.5 F (36.9 C), height 5\' 4"  (1.626 m), weight 185 lb (83.9 kg), last menstrual period 12/18/2015, SpO2 99 %. Body mass  index is 31.76 kg/m.  General: She is overweight, cooperative, alert, well developed, and in no acute distress. PSYCH: Has normal mood, affect and thought process.   Cardiovascular : HR 60's regular Lungs: Normal breathing effort, no conversational dyspnea. Neuro: no focal deficits  DIAGNOSTIC DATA REVIEWED:  BMET    Component Value Date/Time   NA 141 10/06/2022 0927   K 4.6 10/06/2022 0927   CL 100 10/06/2022 0927   CO2 26 10/06/2022 0927   GLUCOSE 81 10/06/2022 0927   GLUCOSE 94 07/21/2022 1112   BUN 9 10/06/2022 0927   CREATININE 1.00 10/06/2022 0927   CREATININE 1.08 (H) 08/30/2017 0838   CALCIUM 9.9 10/06/2022 0927   GFRNONAA >60 07/21/2022 1112   GFRAA 59 (L) 09/11/2020 0923   Lab Results  Component Value Date   HGBA1C 5.4 10/06/2022   Lab Results  Component Value Date   INSULIN 7.1 10/06/2022   Lab Results  Component Value Date   TSH 1.300 10/06/2022   CBC    Component Value Date/Time   WBC 7.2 07/21/2022 1112   RBC 4.41 07/21/2022 1112   HGB 13.3 07/21/2022 1112   HGB 13.2 09/24/2021 0946   HCT 39.6 07/21/2022 1112   HCT 39.1 09/24/2021 0946   PLT 256 07/21/2022 1112   PLT 278 09/24/2021 0946   MCV 89.8 07/21/2022 1112   MCV 89 09/24/2021 0946   MCH 30.2 07/21/2022 1112   MCHC 33.6 07/21/2022 1112   RDW 12.8 07/21/2022 1112   RDW 12.3 09/24/2021 0946   Iron Studies No results found for: "IRON", "TIBC", "FERRITIN", "IRONPCTSAT"  Lipid Panel     Component Value Date/Time   CHOL 222 (H) 10/06/2022 0927   TRIG 131 10/06/2022 0927   HDL 63 10/06/2022 0927   CHOLHDL 3.5 09/24/2021 0946   CHOLHDL 3.2 08/30/2017 0838   VLDL 28 08/12/2016 0917   LDLCALC 136 (H) 10/06/2022 0927   LDLCALC 127 (H) 08/30/2017 0838   Hepatic Function Panel     Component Value Date/Time   PROT 7.2 10/06/2022 0927   ALBUMIN 4.6 10/06/2022 0927   AST 15 10/06/2022 0927   ALT 20 10/06/2022 0927   ALKPHOS 92 10/06/2022 0927   BILITOT 0.6 10/06/2022 0927   BILIDIR  0.1 08/01/2015 0001   IBILI 0.5 08/01/2015 0001      Component Value Date/Time   TSH 1.300 10/06/2022 0927   Nutritional Lab Results  Component Value Date   VD25OH 45.2 10/06/2022   VD25OH 67.5 04/20/2018   VD25OH 59 08/12/2016    ASSOCIATED CONDITIONS ADDRESSED TODAY  ASSESSMENT AND PLAN  Problem List Items Addressed This Visit     Generalized obesity   Vitamin D deficiency - Primary    Vitamin D Deficiency Vitamin D is not at goal of 50.  Most recent vitamin D level was 45.2. She is on OTC vitamin D3 2000 IU daily. Lab Results  Component Value Date   VD25OH 45.2 10/06/2022   VD25OH 67.5 04/20/2018   VD25OH 59 08/12/2016   Plan: Continue OTC vitamin D3 2000 IU daily Low vitamin D levels can be associated with adiposity and may result in leptin resistance and weight gain. Also associated with fatigue. Currently on vitamin D supplementation without any adverse effects.         Depression    Other depression/emotional eating Brandi Carpenter has had issues with stress/emotional eating. Currently this is poorly controlled. Overall mood is stable. Medication(s): Bupropion SR 150 mg daily in am for many years without side effects.  Endorses cravings in afternoons/evenings.  We discussed starting topiramate for cravings including risks, benefits and possible side effects and she is agreeable to start topiramate at this time.  Patient denies history of glaucoma or kidney stones. She was informed of side effects and that topiramate is teratogenic. She is S/P hysterectomy.  Plan: Start Topiramate 25 mg at bedtime daily x 1 week and then increase to 50 mg at bedtime nightly if tolerated well.  Consider referral to Dr. Dewaine Conger at next in office visit.          Relevant Medications   topiramate (TOPAMAX) 25 MG tablet   BMI 31.0-31.9,adult Current BMI 31.9   TREATMENT PLAN FOR OBESITY:  Recommended Dietary Goals  Brandi Carpenter is currently in the action stage of change. As such,  her goal is to continue weight management plan. She has agreed to following a lower carbohydrate, vegetable and lean protein rich diet plan.  Behavioral Intervention  We discussed the following Behavioral Modification Strategies today: increasing lean protein intake, decreasing simple carbohydrates , increasing vegetables, avoiding skipping meals, increasing water intake, continue to practice mindfulness when eating, and planning for success.  Additional resources provided today: NA  Recommended Physical Activity Goals  [place] has been advised to work up to 150 minutes of moderate intensity aerobic activity a week and strengthening exercises 2-3 times per week for cardiovascular health, weight loss maintenance and preservation of muscle mass.   She has agreed to Continue current level of physical activity    Pharmacotherapy We discussed various medication options to help Saharra with her weight loss  efforts and we both agreed to switch to low carbohydrate plan to help reset to promote weight loss.    Return in about 3 weeks (around 01/26/2023).Marland Kitchen She was informed of the importance of frequent follow up visits to maximize her success with intensive lifestyle modifications for her multiple health conditions.   ATTESTASTION STATEMENTS:  Reviewed by clinician on day of visit: allergies, medications, problem list, medical history, surgical history, family history, social history, and previous encounter notes.   I have personally spent 43 minutes total time today in preparation, patient care, nutritional counseling and documentation for this visit, including the following: review of clinical lab tests; review of medical tests/procedures/services.      Jesscia Imm, PA-C

## 2023-01-26 ENCOUNTER — Other Ambulatory Visit (INDEPENDENT_AMBULATORY_CARE_PROVIDER_SITE_OTHER): Payer: Self-pay | Admitting: Physician Assistant

## 2023-01-26 ENCOUNTER — Encounter (INDEPENDENT_AMBULATORY_CARE_PROVIDER_SITE_OTHER): Payer: Self-pay | Admitting: Physician Assistant

## 2023-01-26 ENCOUNTER — Ambulatory Visit (INDEPENDENT_AMBULATORY_CARE_PROVIDER_SITE_OTHER): Payer: BC Managed Care – PPO | Admitting: Physician Assistant

## 2023-01-26 VITALS — BP 121/78 | HR 64 | Temp 98.2°F | Ht 64.0 in | Wt 183.0 lb

## 2023-01-26 DIAGNOSIS — F3289 Other specified depressive episodes: Secondary | ICD-10-CM

## 2023-01-26 DIAGNOSIS — E559 Vitamin D deficiency, unspecified: Secondary | ICD-10-CM | POA: Diagnosis not present

## 2023-01-26 DIAGNOSIS — Z6831 Body mass index (BMI) 31.0-31.9, adult: Secondary | ICD-10-CM

## 2023-01-26 DIAGNOSIS — E669 Obesity, unspecified: Secondary | ICD-10-CM

## 2023-01-26 MED ORDER — TOPIRAMATE 25 MG PO TABS: 25.0000 mg | ORAL_TABLET | Freq: Every day | ORAL | 0 refills | Status: DC

## 2023-01-26 NOTE — Progress Notes (Signed)
.smr  Office: [phone]  /  Fax: [phone]  WEIGHT SUMMARY AND BIOMETRICS  Vitals Temp: 98.2 F (36.8 C) BP: 121/78 Pulse Rate: 64 SpO2: 99 %   Anthropometric Measurements Height: 5\' 4"  (1.626 m) Weight: 183 lb (83 kg) BMI (Calculated): 31.4 Weight at Last Visit: 185lb Weight Lost Since Last Visit: 2lb Weight Gained Since Last Visit: 0 Starting Weight: 190lb Total Weight Loss (lbs): 7 lb (3.175 kg)   Body Composition  Body Fat %: 40.8 % Fat Mass (lbs): 75 lbs Muscle Mass (lbs): 103 lbs Total Body Water (lbs): 76.2 lbs Visceral Fat Rating : 10   Other Clinical Data Fasting: no Labs: no Today's Visit #: 7 Starting Date: 10/06/22   HPI  Chief Complaint: OBESITY  Supreet is here to discuss her progress with her obesity treatment plan. She is on the the Category 2 Plan and states she is following her eating plan approximately 60 % of the time. She states she is exercising 45 minutes 2 times per week.   Interval History:  Since last office visit she is down 2 lbs.  Journaling some- Calories 1200-1400, rare to 1800, protein 48-60's , mostly, highest 75 grams.  Discussed increasing protein to 85 grams daily.  Cravings- started topamax, not sure if helping with cravings for "crunchy lately" but usually sweets.    Discussed better snacking options. Protein chips if salty/crunchy.  Does not cook and eating out , but more mindful with choices. Discussed making sure to meet protein needs and strategies when going out to eat like having a protein snack before going out discussed.  Sleep- On HRT following hysterectomy and sleeping better overall.  Exercise-Not consistently, but doing yard work Hydration-adequate   Pharmacotherapy: topamax for emotional eating/cravings. Some daytime fatigue after increased dose to 50mg  , so decrease to 25 mg and monitor fatigue. No other side effects with topamax.   TREATMENT PLAN FOR OBESITY:  Recommended Dietary  Goals  Hilliary is currently in the action stage of change. As such, her goal is to continue weight management plan. She has agreed to the Category 2 Plan and keeping a food journal and adhering to recommended goals of 1200 calories and 85+ grams of protein.  Behavioral Intervention  We discussed the following Behavioral Modification Strategies today: increasing lean protein intake, decreasing simple carbohydrates , increasing vegetables, increasing lower glycemic fruits, increasing fiber rich foods, increasing water intake, decreasing eating out or consumption of processed foods, and making healthy choices when eating convenient foods, emotional eating strategies and understanding the difference between hunger signals and cravings, continue to practice mindfulness when eating, and planning for success.  Additional resources provided today: NA  Recommended Physical Activity Goals  [place] has been advised to work up to 150 minutes of moderate intensity aerobic activity a week and strengthening exercises 2-3 times per week for cardiovascular health, weight loss maintenance and preservation of muscle mass.   She has agreed to Continue current level of physical activity    Pharmacotherapy We discussed various medication options to help Kazlynn with her weight loss efforts and we both agreed to continue topamax 25 mg at bedtime.    Return in about 2 weeks (around 02/09/2023).Marland Kitchen She was informed of the importance of frequent follow up visits to maximize her success with intensive lifestyle modifications for her multiple health conditions.  PHYSICAL EXAM:  Blood pressure 121/78, pulse 64, temperature 98.2 F (36.8 C), height 5\' 4"  (1.626 m), weight 183 lb (83 kg), last menstrual period 12/18/2015, SpO2  99 %. Body mass index is 31.41 kg/m.  General: She is overweight, cooperative, alert, well developed, and in no acute distress. PSYCH: Has normal mood, affect and thought process.    Cardiovascular : HR 60's regular Lungs: Normal breathing effort, no conversational dyspnea. Neuro: no focal deficits  DIAGNOSTIC DATA REVIEWED:  BMET    Component Value Date/Time   NA 141 10/06/2022 0927   K 4.6 10/06/2022 0927   CL 100 10/06/2022 0927   CO2 26 10/06/2022 0927   GLUCOSE 81 10/06/2022 0927   GLUCOSE 94 07/21/2022 1112   BUN 9 10/06/2022 0927   CREATININE 1.00 10/06/2022 0927   CREATININE 1.08 (H) 08/30/2017 0838   CALCIUM 9.9 10/06/2022 0927   GFRNONAA >60 07/21/2022 1112   GFRAA 59 (L) 09/11/2020 0923   Lab Results  Component Value Date   HGBA1C 5.4 10/06/2022   Lab Results  Component Value Date   INSULIN 7.1 10/06/2022   Lab Results  Component Value Date   TSH 1.300 10/06/2022   CBC    Component Value Date/Time   WBC 7.2 07/21/2022 1112   RBC 4.41 07/21/2022 1112   HGB 13.3 07/21/2022 1112   HGB 13.2 09/24/2021 0946   HCT 39.6 07/21/2022 1112   HCT 39.1 09/24/2021 0946   PLT 256 07/21/2022 1112   PLT 278 09/24/2021 0946   MCV 89.8 07/21/2022 1112   MCV 89 09/24/2021 0946   MCH 30.2 07/21/2022 1112   MCHC 33.6 07/21/2022 1112   RDW 12.8 07/21/2022 1112   RDW 12.3 09/24/2021 0946   Iron Studies No results found for: "IRON", "TIBC", "FERRITIN", "IRONPCTSAT" Lipid Panel     Component Value Date/Time   CHOL 222 (H) 10/06/2022 0927   TRIG 131 10/06/2022 0927   HDL 63 10/06/2022 0927   CHOLHDL 3.5 09/24/2021 0946   CHOLHDL 3.2 08/30/2017 0838   VLDL 28 08/12/2016 0917   LDLCALC 136 (H) 10/06/2022 0927   LDLCALC 127 (H) 08/30/2017 0838   Hepatic Function Panel     Component Value Date/Time   PROT 7.2 10/06/2022 0927   ALBUMIN 4.6 10/06/2022 0927   AST 15 10/06/2022 0927   ALT 20 10/06/2022 0927   ALKPHOS 92 10/06/2022 0927   BILITOT 0.6 10/06/2022 0927   BILIDIR 0.1 08/01/2015 0001   IBILI 0.5 08/01/2015 0001      Component Value Date/Time   TSH 1.300 10/06/2022 0927   Nutritional Lab Results  Component Value Date    VD25OH 45.2 10/06/2022   VD25OH 67.5 04/20/2018   VD25OH 59 08/12/2016    ASSOCIATED CONDITIONS ADDRESSED TODAY  ASSESSMENT AND PLAN  Problem List Items Addressed This Visit     Generalized obesity   Vitamin D deficiency - Primary   Depression   Relevant Medications   topiramate (TOPAMAX) 25 MG tablet   BMI 31.0-31.9,adult Current BMI 31.9   Vitamin D Deficiency Vitamin D is not at goal of 50.  Most recent vitamin D level was 45.2. She is on OTC vitamin D3 2000 IU daily. Lab Results  Component Value Date   VD25OH 45.2 10/06/2022   VD25OH 67.5 04/20/2018   VD25OH 59 08/12/2016    Plan: Continue OTC vitamin D3 2000 IU daily Low vitamin D levels can be associated with adiposity and may result in leptin resistance and weight gain. Also associated with fatigue. Currently on vitamin D supplementation without any adverse effects.   Other depression/emotional eating Earie has had issues with stress/emotional eating. Currently this is moderately controlled.  Overall mood is stable. Medication(s): Topiramate 50mg  at bedtime. Reports some daytime fatigue, not sure if helpful yet for cravings. Will decrease topiramate to 25 mg at bedtime and monitor.  Also long term bupropion XL 150 mg daily without side effects.   Plan: Continue and decrease dose Topiramate 25 mg at bedtime.  Continue to work on emotional eating strategies.  Consider consultation with Dr. Dewaine Conger next visit.    ATTESTASTION STATEMENTS:  Reviewed by clinician on day of visit: allergies, medications, problem list, medical history, surgical history, family history, social history, and previous encounter notes.   I have personally spent 50 minutes total time today in preparation, patient care, nutritional counseling and documentation for this visit, including the following: review of clinical lab tests; review of medical tests/procedures/services.      Adlene Adduci, PA-C

## 2023-01-31 ENCOUNTER — Other Ambulatory Visit: Payer: Self-pay | Admitting: Family Medicine

## 2023-01-31 DIAGNOSIS — G4700 Insomnia, unspecified: Secondary | ICD-10-CM

## 2023-02-01 NOTE — Telephone Encounter (Signed)
Is this okay to refill? 

## 2023-02-09 ENCOUNTER — Ambulatory Visit
Admission: RE | Admit: 2023-02-09 | Discharge: 2023-02-09 | Disposition: A | Discharge status: Home / Self Care | Payer: BC Managed Care – PPO | Source: Ambulatory Visit | Attending: Obstetrics and Gynecology | Admitting: Obstetrics and Gynecology

## 2023-02-09 DIAGNOSIS — Z1231 Encounter for screening mammogram for malignant neoplasm of breast: Secondary | ICD-10-CM

## 2023-02-11 ENCOUNTER — Encounter (INDEPENDENT_AMBULATORY_CARE_PROVIDER_SITE_OTHER): Payer: Self-pay | Admitting: Physician Assistant

## 2023-02-11 ENCOUNTER — Ambulatory Visit (INDEPENDENT_AMBULATORY_CARE_PROVIDER_SITE_OTHER): Payer: BC Managed Care – PPO | Admitting: Physician Assistant

## 2023-02-11 VITALS — BP 137/79 | HR 60 | Temp 97.6°F | Ht 64.0 in | Wt 184.0 lb

## 2023-02-11 DIAGNOSIS — E669 Obesity, unspecified: Secondary | ICD-10-CM | POA: Diagnosis not present

## 2023-02-11 DIAGNOSIS — Z6831 Body mass index (BMI) 31.0-31.9, adult: Secondary | ICD-10-CM | POA: Diagnosis not present

## 2023-02-11 DIAGNOSIS — E559 Vitamin D deficiency, unspecified: Secondary | ICD-10-CM | POA: Diagnosis not present

## 2023-02-11 DIAGNOSIS — F3289 Other specified depressive episodes: Secondary | ICD-10-CM | POA: Diagnosis not present

## 2023-02-11 NOTE — Progress Notes (Signed)
.smr  Office: [phone]  /  Fax: [phone]  WEIGHT SUMMARY AND BIOMETRICS  Vitals Temp: 97.6 F (36.4 C) BP: 137/79 Pulse Rate: 60 SpO2: 100 %   Anthropometric Measurements Height: 5\' 4"  (1.626 m) Weight: 184 lb (83.5 kg) BMI (Calculated): 31.57 Weight at Last Visit: 183 lb Weight Lost Since Last Visit: 0 Weight Gained Since Last Visit: 1   Body Composition  Body Fat %: 40.9 % Fat Mass (lbs): 75.2 lbs Muscle Mass (lbs): 103.2 lbs Total Body Water (lbs): 75.6 lbs Visceral Fat Rating : 10   Other Clinical Data Fasting: nO Labs: No Today's Visit #: 8 Starting Date: 10/06/22     HPI  Chief Complaint: OBESITY  Brandi Carpenter is here to discuss her progress with her obesity treatment plan. She is on the the Category 2 Plan and states she is following her eating plan approximately 70 % of the time. She states she is MOWING.  Discussed the use of AI scribe software for clinical note transcription with the patient, who gave verbal consent to proceed. Interval History:  Since last office visit she is up 1 lbs.  Did well for week after appointment, then struggled to stay on track with plan.  She describes a struggle with maintaining motivation and consistency in her diet and exercise regimen.  The patient acknowledges the importance of protein intake in her diet but admits to not consistently consuming protein-rich foods or shakes. She reports having the necessary ingredients for protein shakes at home but often procrastinates in preparing them. The patient also mentions a preference for consuming eggs for breakfast but has reduced this habit recently.  Regarding physical activity, the patient shares that she owns an elliptical machine and has access to a variety of exercise programs. However, she admits to not using it consistently. She is doing yard work- mowing regularly. The patient expresses a desire to increase her physical activity but struggles with initiating and  maintaining a consistent exercise routine.  The patient also discusses her struggle with perfectionism, which often leads to procrastination and a lack of action if she cannot do everything perfectly.   The patient is currently on Topamax, which she believes might be helping with her cravings. She also mentions being on Wellbutrin, which was reduced from 300mg  to 150mg  over the past six months in an attempt to decrease her medication load. The patient is unsure if the reduction in Wellbutrin has affected her motivation levels. She feels Celexa has definitely helped.   Assessment & Plan  Obesity: Struggling with motivation and consistency in dietary changes and exercise. Adipose percentage slightly above target. Emotional eating behavior and perfectionism may be contributing to difficulty in maintaining changes. GOALS-Encourage consistent protein intake, particularly in the morning. -Initiate regular exercise, starting with elliptical or walking 2-3 times per week. -Consider increasing Wellbutrin dose if lack of motivation persists.      Pharmacotherapy: topamax 25 mg at bedtime for craving/emotional eating- higher dose caused fatigue the following day, but doing well on 25 mg and no other side effects.    TREATMENT PLAN FOR OBESITY:  Recommended Dietary Goals  Quinesha is currently in the action stage of change. As such, her goal is to continue weight management plan. She has agreed to the Category 2 Plan.  Behavioral Intervention  We discussed the following Behavioral Modification Strategies today: increasing lean protein intake, decreasing simple carbohydrates , increasing vegetables, increasing lower glycemic fruits, increasing water intake, emotional eating strategies and understanding the difference between hunger signals  and cravings, continue to practice mindfulness when eating, and planning for success.  Additional resources provided today: NA  Recommended Physical Activity  Goals  Diannah has been advised to work up to 150 minutes of moderate intensity aerobic activity a week and strengthening exercises 2-3 times per week for cardiovascular health, weight loss maintenance and preservation of muscle mass.   She has agreed to Think about ways to increase daily physical activity and overcoming barriers to exercise, Increase physical activity in their day and reduce sedentary time (increase NEAT)., and start elliptical or walking at least 2-3 times weekly   Pharmacotherapy We discussed various medication options to help Nyaire with her weight loss efforts and we both agreed to continue topamax for emotional eating behavior.   Return in about 3 weeks (around 03/04/2023).Marland Kitchen She was informed of the importance of frequent follow up visits to maximize her success with intensive lifestyle modifications for her multiple health conditions.  PHYSICAL EXAM:  Blood pressure 137/79, pulse 60, temperature 97.6 F (36.4 C), height 5\' 4"  (1.626 m), weight 184 lb (83.5 kg), last menstrual period 12/18/2015, SpO2 100 %. Body mass index is 31.58 kg/m.  General: She is overweight, cooperative, alert, well developed, and in no acute distress. PSYCH: Has normal mood, affect and thought process.   Cardiovascular: HR 60's regular Lungs: Normal breathing effort, no conversational dyspnea. Neuro: no focal deficits  DIAGNOSTIC DATA REVIEWED:  BMET    Component Value Date/Time   NA 141 10/06/2022 0927   K 4.6 10/06/2022 0927   CL 100 10/06/2022 0927   CO2 26 10/06/2022 0927   GLUCOSE 81 10/06/2022 0927   GLUCOSE 94 07/21/2022 1112   BUN 9 10/06/2022 0927   CREATININE 1.00 10/06/2022 0927   CREATININE 1.08 (H) 08/30/2017 0838   CALCIUM 9.9 10/06/2022 0927   GFRNONAA >60 07/21/2022 1112   GFRAA 59 (L) 09/11/2020 0923   Lab Results  Component Value Date   HGBA1C 5.4 10/06/2022   Lab Results  Component Value Date   INSULIN 7.1 10/06/2022   Lab Results  Component Value  Date   TSH 1.300 10/06/2022   CBC    Component Value Date/Time   WBC 7.2 07/21/2022 1112   RBC 4.41 07/21/2022 1112   HGB 13.3 07/21/2022 1112   HGB 13.2 09/24/2021 0946   HCT 39.6 07/21/2022 1112   HCT 39.1 09/24/2021 0946   PLT 256 07/21/2022 1112   PLT 278 09/24/2021 0946   MCV 89.8 07/21/2022 1112   MCV 89 09/24/2021 0946   MCH 30.2 07/21/2022 1112   MCHC 33.6 07/21/2022 1112   RDW 12.8 07/21/2022 1112   RDW 12.3 09/24/2021 0946   Iron Studies No results found for: "IRON", "TIBC", "FERRITIN", "IRONPCTSAT" Lipid Panel     Component Value Date/Time   CHOL 222 (H) 10/06/2022 0927   TRIG 131 10/06/2022 0927   HDL 63 10/06/2022 0927   CHOLHDL 3.5 09/24/2021 0946   CHOLHDL 3.2 08/30/2017 0838   VLDL 28 08/12/2016 0917   LDLCALC 136 (H) 10/06/2022 0927   LDLCALC 127 (H) 08/30/2017 0838   Hepatic Function Panel     Component Value Date/Time   PROT 7.2 10/06/2022 0927   ALBUMIN 4.6 10/06/2022 0927   AST 15 10/06/2022 0927   ALT 20 10/06/2022 0927   ALKPHOS 92 10/06/2022 0927   BILITOT 0.6 10/06/2022 0927   BILIDIR 0.1 08/01/2015 0001   IBILI 0.5 08/01/2015 0001      Component Value Date/Time   TSH 1.300  10/06/2022 4098   Nutritional Lab Results  Component Value Date   VD25OH 45.2 10/06/2022   VD25OH 67.5 04/20/2018   VD25OH 59 08/12/2016    ASSOCIATED CONDITIONS ADDRESSED TODAY  ASSESSMENT AND PLAN  Problem List Items Addressed This Visit     Generalized obesity   Vitamin D deficiency - Primary   Depression   BMI 31.0-31.9,adult Current BMI 31.9   Vitamin D Deficiency Vitamin D is not at goal of 50.  Most recent vitamin D level was 45.2. She is on OTC vitamin D3 2000 IU daily. Lab Results  Component Value Date   VD25OH 45.2 10/06/2022   VD25OH 67.5 04/20/2018   VD25OH 59 08/12/2016    Plan: Continue OTC vitamin D3 2000 IU daily Low vitamin D levels can be associated with adiposity and may result in leptin resistance and weight gain. Also  associated with fatigue. Currently on vitamin D supplementation without any adverse effects.   Other depression/emotional eating Brycen has had issues with stress/emotional eating. Currently this is moderately controlled. Overall mood is stable. Medication(s): Bupropion SR 150 mg daily in am, Topiramate 25 mg at bedtime, and Other: Celexa 20 mg daily The patient is currently on Topamax, which she believes might be helping with her cravings. On Wellbutrin, which was reduced from 300mg  to 150mg  over the past six months in an attempt to decrease her medication load. The patient is unsure if the reduction in Wellbutrin has affected her motivation levels. She feels Celexa has definitely helped.  Plan: Emotional eating behavior and perfectionism may be contributing to difficulty in maintaining changes.She expresses challenges with maintaining a consistent diet and exercise routine, possibly influenced by her perfectionistic tendencies and struggles with motivation. The patient is currently on medication for cravings and has previously adjusted her medication regimen.  Continue Bupropion SR 150 mg daily in am, Topiramate 25 mg nightly, and Other: celexa 20 mg daily . Would consider increasing bupropion in future as option to help with motivation/emotional eating behaviors.  Consider referral to Dr. Dewaine Conger as well.   ATTESTASTION STATEMENTS:  Reviewed by clinician on day of visit: allergies, medications, problem list, medical history, surgical history, family history, social history, and previous encounter notes.   I have personally spent 48 minutes total time today in preparation, patient care, nutritional counseling and documentation for this visit, including the following: review of clinical lab tests; review of medical tests/procedures/services.      Nolita Kutter, PA-C

## 2023-02-17 ENCOUNTER — Ambulatory Visit: Payer: BC Managed Care – PPO | Admitting: Obstetrics and Gynecology

## 2023-02-18 ENCOUNTER — Encounter: Payer: Self-pay | Admitting: Gynecologic Oncology

## 2023-02-18 NOTE — Progress Notes (Signed)
Gynecologic Oncology Return Clinic Visit  02/19/23  Reason for Visit: surveillance  Treatment History: Oncology History Overview Note  Pap test on 02/11/2022 showed atypical glandular cells, not otherwise specified.  High risk HPV negative.  05/14/2022 revealing 3 filling defects of the endometrium.   P53 wild-type MMR intact   Endometrial adenocarcinoma (HCC)  03/06/2022 Procedure   colposcopy was performed with biopsies including endocervical curettage and endometrial biopsy on 03/06/2022.  This showed fragments of normal endocervical tissue mixed with few strips of metaplastic squamous epithelium.  Endometrial biopsy revealed fragments of benign endometrial surface epithelium with fragments of normal endocervical epithelium.  No hyperplasia, malignancy, polyps, or endometritis.    06/15/2022 Surgery   colposcopy with LEEP, ECC, hysteroscopy with MyoSure resection of endometrial polyps and D&C.  Pathology revealed focal endometrioid adenocarcinoma, FIGO grade 1 involving fragments of an endometrial polyp from the polypectomy.  D&C showed fragments of benign endometrial surface epithelium admixed with fragments of ecto and endocervical epithelium, no hyperplasia or malignancy.  Endocervical curettage showed fragments of benign endocervical epithelium, no malignancy.  LEEP showed L SIL involving ectocervical margin and abutting the endocervical margin from 12-3 o'clock.  L SIL involves the ecto and" cervical margin from 3-6 o'clock.  LSIL involves the ectocervical margin and abuts the endocervical margin from 9-12 o'clock.  No high-grade dysplasia or malignancy seen.  Top-Hat portion of the LEEP shows L SIL focally extending to the inked margin.    06/23/2022 Initial Diagnosis   Endometrial adenocarcinoma (HCC)   07/30/2022 Surgery   Robotic-assisted laparoscopic total hysterectomy with bilateral salpingoophorectomy, SLN biopsy bilaterally   Findings: On EUA, small mobile uterus. On  intra-abdominal entry, normal upper abdominal survey. Normal omentum, small and large bowel. No ascites. Uterus 8 cm and normal appearing. Normal appearing bilateral tubes and ovaries. Mapping successful to bilateral obturator sentinel lymph nodes. Left somewhat larger but not grossly involved with tumor. No intra-abdominal findings c/w metastatic disease.    07/30/2022 Pathology Results   A. RIGHT OBTURATOR SENTINEL LYMPH NODE, BIOPSY: One benign lymph node, negative for carcinoma (0/1)  B. LEFT OBTURATOR SENTINEL LYMPH NODE, BIOPSY: One benign lymph node, negative for carcinoma (0/1)  C. UTERUS WITH RIGHT AND LEFT FALLOPIAN TUBE AND OVARY, HYSTERECTOMY AND BILATERAL SALPINGO-OOPHORECTOMY: Negative for residual carcinoma (ypT0) (see comment) Adenomyosis Benign leiomyomata, intramural, measuring up to 0.6 cm in greatest dimension Benign inactive endometrium with cystic change Mild chronic cervicitis with squamous metaplasia Benign tubes and ovaries  COMMENT: The endometrium is entirely submitted and shows no evidence of residual carcinoma.  The prior diagnostic biopsy is reviewed ([id number]) and shows a focal well-differentiated endometrioid adenocarcinoma involving four fragments of an apparent polyp.  No carcinoma or residual polyp is evident within this hysterectomy. Immunohistochemical stains for pancytokeratin (AE1/AE3) performed on the right and left obturator sentinel lymph nodes are negative for metastatic tumor with adequate control.      Interval History: Doing well.  Denies any vaginal bleeding or discharge.  Has had a couple of yeast infections since her last visit with me.  Denies any abdominal or pelvic pain.  Reports baseline bowel bladder function.  Past Medical/Surgical History: Past Medical History:  Diagnosis Date   Abnormal Pap smear    ASCUS 08/1999; CIN 11/1999, s/p conization, 12-26-2018 AGUS   Allergic rhinitis, cause unspecified    Anxiety    Back  pain    Basal cell carcinoma 06/12/2022   Complication of anesthesia    "difficulty awakening" remote past   DDD (degenerative  disc disease), cervical 05/05/2018   Noted on imaging   DDD (degenerative disc disease), lumbar 06/08/2018   Noted on imaging   Depression    Endometrial polyp    Family history of adverse reaction to anesthesia    mother slow to awaken   GERD (gastroesophageal reflux disease)    Headache    Insomnia    PMB (postmenopausal bleeding) 05/08/2021   Post-nasal drip 05/08/2021   uses  mucinex daily for   Spondylolysis, 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 Noland Fordyce - [facility] - Moderate to severe dysplasia, negative margins   COLONOSCOPY  09/2015   COLPOSCOPY N/A 06/15/2022   Procedure: COLPOSCOPY;  Surgeon: Patton Salles, MD;  Location: [facility];  Service: Gynecology;  Laterality: N/A;   DILATATION & CURETTAGE/HYSTEROSCOPY WITH MYOSURE N/A 01/23/2019   Procedure: DILATATION & CURETTAGE/HYSTEROSCOPY WITH MYOSURE;  Surgeon: Patton Salles, MD;  Location: [facility];  Service: Gynecology;  Laterality: N/A;   DILATATION & CURETTAGE/HYSTEROSCOPY WITH MYOSURE N/A 05/13/2021   Procedure: DILATATION & CURETTAGE/HYSTEROSCOPY WITH MYOSURE RESECTION OF ENDOMETRIAL POLYP, ENDOCERVICAL CURETTAGE;  Surgeon: Patton Salles, MD;  Location: [facility];  Service: Gynecology;  Laterality: N/A;   DILATATION &  CURETTAGE/HYSTEROSCOPY WITH MYOSURE N/A 06/15/2022   Procedure: DILATATION & CURETTAGE/HYSTEROSCOPY WITH MYOSURE;  Surgeon: Patton Salles, MD;  Location: [facility];  Service: Gynecology;  Laterality: N/A;   LEEP N/A 06/15/2022   Procedure: LOOP ELECTROSURGICAL EXCISION PROCEDURE (LEEP);  Surgeon: Patton Salles, MD;  Location: [facility];  Service: Gynecology;  Laterality: N/A;   LUMBAR DISC SURGERY  10/2018   LUMBAR MICRODISCECTOMY Right 11/09/2018   lumbar level:L5-S1 Utilizing microscope-Dr.Gary Cram   NASAL SEPTOPLASTY W/ TURBINOPLASTY Bilateral 10/07/2015   Procedure: NASAL SEPTOPLASTY WITH BILATERAL TURBINATE REDUCTION;  Surgeon: Newman Pies, MD;  Location: [facility];  Service: ENT;  Laterality: Bilateral;   PILONIDAL CYST EXCISION  1988   ROBOTIC ASSISTED TOTAL HYSTERECTOMY WITH BILATERAL SALPINGO OOPHERECTOMY Bilateral 07/30/2022   Procedure: XI ROBOTIC ASSISTED TOTAL HYSTERECTOMY WITH BILATERAL SALPINGO OOPHORECTOMY, POSSIBLE LAPAROTOMY;  Surgeon: Carver Fila, MD;  Location: [facility];  Service: Gynecology;  Laterality: Bilateral;   SENTINEL NODE BIOPSY N/A 07/30/2022   Procedure: SENTINEL NODE BIOPSY;  Surgeon: Carver Fila, MD;  Location: [facility];  Service: Gynecology;  Laterality: N/A;   WISDOM TOOTH EXTRACTION     yrs ago    Family History  Problem Relation Age of Onset   Hypertension Mother    Diabetes Mother    GER disease Mother    Obesity Mother    Obesity Father    Heart disease Father    Sudden death Father    Brain cancer Sister        glioblastoma   Depression Sister    Hypertension Sister    Colon cancer Maternal Grandmother  died at 2   Diabetes Maternal Grandmother    Alzheimer's disease Paternal Grandmother    Colon polyps Maternal Uncle    GER disease Maternal Uncle    Breast cancer Cousin    Migraines Neg Hx    Ovarian cancer Neg Hx    Endometrial cancer Neg Hx     Pancreatic cancer Neg Hx    Prostate cancer Neg Hx     Social History   Socioeconomic History   Marital status: Media planner    Spouse name: Not on file   Number of children: 0   Years of education: 16   Highest education level: Not on file  Occupational History   Occupation: Garment/textile technologist: [employer]  Tobacco Use   Smoking status: Never   Smokeless tobacco: Never  Vaping Use   Vaping Use: Never used  Substance and Sexual Activity   Alcohol use: No    Alcohol/week: 0.0 standard drinks of alcohol   Drug use: Never   Sexual activity: Not Currently    Partners: Female    Birth control/protection: Abstinence, Post-menopausal  Other Topics Concern   Not on file  Social History Narrative   Lives with female partner Olegario Messier), 2 beagles.      Updated 09/2022   Social Determinants of Health   Financial Resource Strain: Not on file  Food Insecurity: Not on file  Transportation Needs: Not on file  Physical Activity: Not on file  Stress: Not on file  Social Connections: Not on file    Current Medications:  Current Outpatient Medications:    ALPRAZolam (XANAX) 0.5 MG tablet, TAKE 1/2 TO 1 TABLET BY MOUTH THREE TIMES DAILY AS NEEDED FOR ANXIETY, Disp: 20 tablet, Rfl: 0   buPROPion (WELLBUTRIN XL) 150 MG 24 hr tablet, Take 150 mg by mouth daily., Disp: , Rfl:    Cholecalciferol (VITAMIN D) 2000 UNITS tablet, Take 2,000 Units by mouth daily., Disp: , Rfl:    citalopram (CELEXA) 20 MG tablet, Take 20 mg by mouth every evening., Disp: , Rfl:    diphenhydrAMINE (BENADRYL) 25 MG tablet, Take 25 mg by mouth every 6 (six) hours as needed for allergies., Disp: , Rfl:    EPINEPHrine 0.3 mg/0.3 mL IJ SOAJ injection, Inject 0.3 mg into the muscle as needed for anaphylaxis., Disp: , Rfl:    estradiol (VIVELLE-DOT) 0.05 MG/24HR patch, Place 1 patch onto the skin 2 (two) times a week., Disp: , Rfl:    famotidine (PEPCID) 20 MG tablet, Take 20 mg by mouth daily., Disp: ,  Rfl:    fluconazole (DIFLUCAN) 150 MG tablet, Take 1 tablet (150 mg total) by mouth daily. Take one table, if symptoms not better in 48-72 hours, can take a second dose., Disp: 2 tablet, Rfl: 3   fluticasone (FLONASE) 50 MCG/ACT nasal spray, Place 2 sprays into both nostrils daily., Disp: , Rfl: 0   gabapentin (NEURONTIN) 400 MG capsule, Take 400 mg by mouth every evening., Disp: , Rfl:    guaiFENesin (MUCINEX) 600 MG 12 hr tablet, Take 1,200 mg by mouth 2 (two) times daily., Disp: , Rfl:    levocetirizine (XYZAL) 5 MG tablet, TAKE 1 TABLET BY MOUTH EVERY EVENING, Disp: 90 tablet, Rfl: 3   montelukast (SINGULAIR) 10 MG tablet, Take 10 mg by mouth every evening., Disp: , Rfl:    Pseudoephedrine-DM-GG-APAP (SUDAFED COLD/COUGH PO), Take by mouth., Disp: , Rfl:    sodium chloride (OCEAN) 0.65 % SOLN nasal spray, Place 1  spray into both nostrils daily., Disp: , Rfl:    topiramate (TOPAMAX) 25 MG tablet, TAKE 1 TABLET(25 MG) BY MOUTH DAILY. INCREASE TO 50 MG AT BEDTIME. IF TOLERATING WELL AFTER 1 WEEK, Disp: 90 tablet, Rfl: 0   zolpidem (AMBIEN) 10 MG tablet, TAKE 1 TABLET(10 MG) BY MOUTH AT BEDTIME AS NEEDED FOR SLEEP, Disp: 30 tablet, Rfl: 0  Review of Systems: Denies appetite changes, fevers, chills, fatigue, unexplained weight changes. Denies hearing loss, neck lumps or masses, mouth sores, ringing in ears or voice changes. Denies cough or wheezing.  Denies shortness of breath. Denies chest pain or palpitations. Denies leg swelling. Denies abdominal distention, pain, blood in stools, constipation, diarrhea, nausea, vomiting, or early satiety. Denies pain with intercourse, dysuria, frequency, hematuria or incontinence. Denies hot flashes, pelvic pain, vaginal bleeding or vaginal discharge.   Denies joint pain, back pain or muscle pain/cramps. Denies itching, rash, or wounds. Denies dizziness, headaches, numbness or seizures. Denies swollen lymph nodes or glands, denies easy bruising or  bleeding. Denies anxiety, depression, confusion, or decreased concentration.  Physical Exam: BP 118/73 (BP Location: Left Arm, Patient Position: Sitting)   Pulse 80   Temp 97.8 F (36.6 C) (Axillary)   Resp 18   Ht 5' 4.96" (1.65 m)   Wt 187 lb 3.2 oz (84.9 kg)   LMP 12/18/2015   SpO2 98%   BMI 31.19 kg/m  General: Alert, oriented, no acute distress. HEENT: Normocephalic, atraumatic, sclera anicteric. Chest: Lungs are clear to auscultation bilaterally, no wheezes or rhonchi. Cardiovascular: Regular rate and rhythm, no murmurs or rubs appreciated. Abdomen: soft, nontender.  Normoactive bowel sounds.  No masses or hepatosplenomegaly appreciated.  Well-healed incisions. Extremities: Grossly normal range of motion.  Warm, well perfused.  No edema bilaterally. Skin: No rashes or lesions noted. Lymphatics: No cervical, supraclavicular, or inguinal adenopathy. GU: Normal appearing external genitalia without erythema, excoriation, or lesions.  Speculum exam reveals cuff intact, no visible lesions.  Bimanual exam reveals cuff intact, no masses, nodularity, or nodularity on palpation.  Rectovaginal exam confirms these findings.  Laboratory & Radiologic Studies: None new  Assessment & Plan: Brandi Carpenter is a 57 y.o. 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 MMR intact, MSS. P53 negative.   Patient is overall doing well, NED on exam today.   Per NCCN surveillance recommendations, we will plan on visits every 6 months.  We will plan to alternate visits between my office and her OB/GYN.  She will see her OB/GYN for follow-up in 6 months.  We reviewed signs and symptoms that should prompt a phone call between visits and would be concerning for possible cancer recurrence.   20 minutes of total time was spent for this patient encounter, including preparation, face-to-face counseling with the patient and coordination of care,  and documentation of the encounter.  Eugene Garnet, MD  Division of Gynecologic Oncology  Department of Obstetrics and Gynecology  [facility]

## 2023-02-19 ENCOUNTER — Encounter: Payer: Self-pay | Admitting: Gynecologic Oncology

## 2023-02-19 ENCOUNTER — Other Ambulatory Visit: Payer: Self-pay

## 2023-02-19 ENCOUNTER — Inpatient Hospital Stay: Payer: BC Managed Care – PPO | Attending: Gynecologic Oncology | Admitting: Gynecologic Oncology

## 2023-02-19 VITALS — BP 118/73 | HR 80 | Temp 97.8°F | Resp 18 | Ht 64.96 in | Wt 187.2 lb

## 2023-02-19 DIAGNOSIS — C541 Malignant neoplasm of endometrium: Secondary | ICD-10-CM

## 2023-02-19 DIAGNOSIS — Z90710 Acquired absence of both cervix and uterus: Secondary | ICD-10-CM | POA: Insufficient documentation

## 2023-02-19 DIAGNOSIS — Z8542 Personal history of malignant neoplasm of other parts of uterus: Secondary | ICD-10-CM | POA: Insufficient documentation

## 2023-02-19 DIAGNOSIS — Z90722 Acquired absence of ovaries, bilateral: Secondary | ICD-10-CM | POA: Insufficient documentation

## 2023-02-19 DIAGNOSIS — B379 Candidiasis, unspecified: Secondary | ICD-10-CM

## 2023-02-19 MED ORDER — FLUCONAZOLE 150 MG PO TABS: 150.0000 mg | ORAL_TABLET | Freq: Every day | ORAL | 3 refills | Status: DC

## 2023-02-19 NOTE — Patient Instructions (Signed)
It was good to see you today.  I do not see or feel any evidence of cancer recurrence on your exam.  I will see you for follow-up in 12 months. Please schedule an appointment in 6 months with your OBGYN.  As always, if you develop any new and concerning symptoms before your next visit, please call to see me sooner.

## 2023-03-02 ENCOUNTER — Encounter (INDEPENDENT_AMBULATORY_CARE_PROVIDER_SITE_OTHER): Payer: Self-pay | Admitting: Physician Assistant

## 2023-03-02 ENCOUNTER — Ambulatory Visit (INDEPENDENT_AMBULATORY_CARE_PROVIDER_SITE_OTHER): Payer: BC Managed Care – PPO | Admitting: Physician Assistant

## 2023-03-02 VITALS — BP 103/74 | HR 60 | Temp 99.0°F | Ht 64.0 in | Wt 183.0 lb

## 2023-03-02 DIAGNOSIS — E669 Obesity, unspecified: Secondary | ICD-10-CM | POA: Diagnosis not present

## 2023-03-02 DIAGNOSIS — E559 Vitamin D deficiency, unspecified: Secondary | ICD-10-CM | POA: Diagnosis not present

## 2023-03-02 DIAGNOSIS — F3289 Other specified depressive episodes: Secondary | ICD-10-CM

## 2023-03-02 DIAGNOSIS — M545 Low back pain, unspecified: Secondary | ICD-10-CM | POA: Diagnosis not present

## 2023-03-02 DIAGNOSIS — Z6831 Body mass index (BMI) 31.0-31.9, adult: Secondary | ICD-10-CM

## 2023-03-02 MED ORDER — TOPIRAMATE 25 MG PO TABS: ORAL_TABLET | ORAL | 0 refills | Status: DC

## 2023-03-02 NOTE — Progress Notes (Signed)
.smr  Office: [phone]  /  Fax: [phone]  WEIGHT SUMMARY AND BIOMETRICS  Vitals Temp: 99 F (37.2 C) BP: 103/74 Pulse Rate: 60 SpO2: 96 %   Anthropometric Measurements Height: 5\' 4"  (1.626 m) Weight: 183 lb (83 kg) BMI (Calculated): 31.4 Weight at Last Visit: 184 lb Weight Lost Since Last Visit: 1 lb Weight Gained Since Last Visit: 0 lb   Body Composition  Body Fat %: 40.7 % Fat Mass (lbs): 74.8 lbs Muscle Mass (lbs): 103.4 lbs Total Body Water (lbs): 76.2 lbs Visceral Fat Rating : 70   Other Clinical Data Fasting: No Labs: No Today's Visit #: 9 Starting Date: 10/06/22     HPI  Chief Complaint: OBESITY  Brandi Carpenter is here to discuss her progress with her obesity treatment plan. She is on the the Category 2 Plan and states she is following her eating plan approximately 50 % of the time. She states she is exercising 30 minutes 3 times per week.   Interval History:  Since last office visit she down 1 lb. Hunger/appetite-moderate control Cravings- denies excessive, but some after dinner snacking Stress- Partner had total knee replacement yesterday and is home recovering. Some increased stress, eating take out Exercise-Walking 3 times weekly for 30 minutes.   Some increase in back pain lately, taking ibuprofen and discussed following up with Dr. Wynetta Carpenter. Discussed the importance of consistent stretching and suggested yoga poses and exercises from Dr. Teresa Carpenter videos for back pain relief.   Weight Management: Patient has lost 1 pound and increased muscle mass by 0.2. Patient has been adhering to exercise plan, specifically walking three times a week. However, patient has not been consistent with weightlifting. Patient reports feeling bloated, possibly due to recent intake of ibuprofen for back pain. Discussed the importance of consistent protein intake and suggested meal delivery services like [employer] or [employer] [place] to meet protein and calorie goals more  consistently.  Also discussed the possibility of increasing Wellbutrin dosage from 150 mg daily back to previous dose of 300 mg daily and the patient will discuss further with her primary care provider. -Continue current exercise regimen, incorporating weightlifting when possible if cleared by provider. -Consider increasing Wellbutrin dosage after discussion with primary care provider. -Consider meal delivery services for better nutrition management/adherence to nutrition plan. -Return for follow-up in four weeks on July 15th.   Pharmacotherapy: Topamax 25 mg at bedtime without side effects.   TREATMENT PLAN FOR OBESITY:  Recommended Dietary Goals  Brandi Carpenter is currently in the action stage of change. As such, her goal is to continue weight management plan. She has agreed to the Category 2 Plan.  Behavioral Intervention  We discussed the following Behavioral Modification Strategies today: increasing lean protein intake, decreasing simple carbohydrates , increasing vegetables, increasing lower glycemic fruits, increasing fiber rich foods, increasing water intake, work on meal planning and preparation, decreasing eating out or consumption of processed foods, and making healthy choices when eating convenient foods, emotional eating strategies and understanding the difference between hunger signals and cravings, continue to practice mindfulness when eating, and planning for success.  Additional resources provided today: NA  Recommended Physical Activity Goals  Brandi Carpenter has been advised to work up to 150 minutes of moderate intensity aerobic activity a week and strengthening exercises 2-3 times per week for cardiovascular health, weight loss maintenance and preservation of muscle mass.   She has agreed to Continue current level of physical activity    Pharmacotherapy We discussed various medication options to help  Brandi Carpenter with her weight loss efforts and we both agreed to continue topamax 25  mg daily for cravings.    Return in about 4 weeks (around 03/30/2023).Marland Kitchen She was informed of the importance of frequent follow up visits to maximize her success with intensive lifestyle modifications for her multiple health conditions.  PHYSICAL EXAM:  Blood pressure 103/74, pulse 60, temperature 99 F (37.2 C), height 5\' 4"  (1.626 m), weight 183 lb (83 kg), last menstrual period 12/18/2015, SpO2 96 %. Body mass index is 31.41 kg/m.  General: She is overweight, cooperative, alert, well developed, and in no acute distress. PSYCH: Has normal mood, affect and thought process.   Cardiovascular: HR 60's regular, BP 103/74 Lungs: Normal breathing effort, no conversational dyspnea. Neuro: no focal deficits  DIAGNOSTIC DATA REVIEWED:  BMET    Component Value Date/Time   NA 141 10/06/2022 0927   K 4.6 10/06/2022 0927   CL 100 10/06/2022 0927   CO2 26 10/06/2022 0927   GLUCOSE 81 10/06/2022 0927   GLUCOSE 94 07/21/2022 1112   BUN 9 10/06/2022 0927   CREATININE 1.00 10/06/2022 0927   CREATININE 1.08 (H) 08/30/2017 0838   CALCIUM 9.9 10/06/2022 0927   GFRNONAA >60 07/21/2022 1112   GFRAA 59 (L) 09/11/2020 0923   Lab Results  Component Value Date   HGBA1C 5.4 10/06/2022   Lab Results  Component Value Date   INSULIN 7.1 10/06/2022   Lab Results  Component Value Date   TSH 1.300 10/06/2022   CBC    Component Value Date/Time   WBC 7.2 07/21/2022 1112   RBC 4.41 07/21/2022 1112   HGB 13.3 07/21/2022 1112   HGB 13.2 09/24/2021 0946   HCT 39.6 07/21/2022 1112   HCT 39.1 09/24/2021 0946   PLT 256 07/21/2022 1112   PLT 278 09/24/2021 0946   MCV 89.8 07/21/2022 1112   MCV 89 09/24/2021 0946   MCH 30.2 07/21/2022 1112   MCHC 33.6 07/21/2022 1112   RDW 12.8 07/21/2022 1112   RDW 12.3 09/24/2021 0946   Iron Studies No results found for: "IRON", "TIBC", "FERRITIN", "IRONPCTSAT" Lipid Panel     Component Value Date/Time   CHOL 222 (H) 10/06/2022 0927   TRIG 131 10/06/2022  0927   HDL 63 10/06/2022 0927   CHOLHDL 3.5 09/24/2021 0946   CHOLHDL 3.2 08/30/2017 0838   VLDL 28 08/12/2016 0917   LDLCALC 136 (H) 10/06/2022 0927   LDLCALC 127 (H) 08/30/2017 0838   Hepatic Function Panel     Component Value Date/Time   PROT 7.2 10/06/2022 0927   ALBUMIN 4.6 10/06/2022 0927   AST 15 10/06/2022 0927   ALT 20 10/06/2022 0927   ALKPHOS 92 10/06/2022 0927   BILITOT 0.6 10/06/2022 0927   BILIDIR 0.1 08/01/2015 0001   IBILI 0.5 08/01/2015 0001      Component Value Date/Time   TSH 1.300 10/06/2022 0927   Nutritional Lab Results  Component Value Date   VD25OH 45.2 10/06/2022   VD25OH 67.5 04/20/2018   VD25OH 59 08/12/2016    ASSOCIATED CONDITIONS ADDRESSED TODAY  ASSESSMENT AND PLAN  Problem List Items Addressed This Visit     Generalized obesity   Vitamin D deficiency - Primary   Depression   Relevant Medications   topiramate (TOPAMAX) 25 MG tablet   BMI 31.0-31.9,adult Current BMI 31.9   Low back pain   Vitamin D Deficiency Vitamin D is not at goal of 50.  Most recent vitamin D level was 45.2. She is  on OTC vitamin D3 2000 IU daily. No side effects with OTC vitamin D.  Lab Results  Component Value Date   VD25OH 45.2 10/06/2022   VD25OH 67.5 04/20/2018   VD25OH 59 08/12/2016    Plan: Continue OTC vitamin D3 2000 IU daily Low vitamin D levels can be associated with adiposity and may result in leptin resistance and weight gain. Also associated with fatigue. Currently on vitamin D supplementation without any adverse effects.    Other depression/emotional eating Itsel has had issues with stress/emotional eating. Currently this is moderately controlled. Overall mood is stable. Medication(s): Bupropion SR 150 mg daily in am and Topiramate 25 mg at bedtime. No side effects with medications.   Plan: Continue and refill Topiramate 25 mg at bedtime .  She is going to discuss increasing Wellbutrin back to 300 mg daily with her PCP.  Continue  to work on emotional eating strategies.   Low back pain: [name] back pain has been a recurring issue, with a history of surgery for calcium buildup. They report that the pain, which extends into their buttock area, tends to worsen with increased activity or prolonged sitting. They have been managing the pain with ibuprofen and some stretching exercises. Plan:  Patient has not followed up with the provider who treated the back issue. Discussed the importance of consistent stretching and suggested yoga poses and exercises from Dr. Teresa Carpenter videos for back pain relief. Also recommended following up with Dr. Wynetta Carpenter who treated the back issue. -Engage in consistent stretching and consider yoga poses and exercises from Dr. Teresa Carpenter videos.    ATTESTASTION STATEMENTS:  Reviewed by clinician on day of visit: allergies, medications, problem list, medical history, surgical history, family history, social history, and previous encounter notes.   I have personally spent 45 minutes total time today in preparation, patient care, nutritional counseling and documentation for this visit, including the following: review of clinical lab tests; review of medical tests/procedures/services.      Janis Cuffe, PA-C

## 2023-03-04 ENCOUNTER — Other Ambulatory Visit (INDEPENDENT_AMBULATORY_CARE_PROVIDER_SITE_OTHER): Payer: Self-pay | Admitting: Physician Assistant

## 2023-03-04 DIAGNOSIS — F3289 Other specified depressive episodes: Secondary | ICD-10-CM

## 2023-03-29 ENCOUNTER — Ambulatory Visit (INDEPENDENT_AMBULATORY_CARE_PROVIDER_SITE_OTHER): Payer: BC Managed Care – PPO | Admitting: Physician Assistant

## 2023-04-02 ENCOUNTER — Other Ambulatory Visit: Payer: Self-pay | Admitting: Obstetrics and Gynecology

## 2023-04-05 NOTE — Telephone Encounter (Signed)
Med refill request: estradiol Last AEX: 02/11/22 Next AEX: cancelled 02/17/23 Last MMG (if hormonal med) 02/09/23 Refill authorized: Please Advise, #[id number] patch

## 2023-04-06 NOTE — Telephone Encounter (Signed)
Please contact patient to schedule annual exam with me for December, 2024.

## 2023-04-06 NOTE — Telephone Encounter (Signed)
Msg sent to appt desk.  

## 2023-04-07 NOTE — Telephone Encounter (Signed)
Pt scheduled for 08/18/2023.

## 2023-04-15 ENCOUNTER — Ambulatory Visit (INDEPENDENT_AMBULATORY_CARE_PROVIDER_SITE_OTHER): Payer: BC Managed Care – PPO | Admitting: Physician Assistant

## 2023-04-15 ENCOUNTER — Encounter (INDEPENDENT_AMBULATORY_CARE_PROVIDER_SITE_OTHER): Payer: Self-pay | Admitting: Physician Assistant

## 2023-04-15 VITALS — BP 117/70 | HR 68 | Temp 98.0°F | Ht 64.0 in | Wt 183.0 lb

## 2023-04-15 DIAGNOSIS — E669 Obesity, unspecified: Secondary | ICD-10-CM | POA: Diagnosis not present

## 2023-04-15 DIAGNOSIS — E7800 Pure hypercholesterolemia, unspecified: Secondary | ICD-10-CM | POA: Insufficient documentation

## 2023-04-15 DIAGNOSIS — F32A Depression, unspecified: Secondary | ICD-10-CM | POA: Diagnosis not present

## 2023-04-15 DIAGNOSIS — Z6831 Body mass index (BMI) 31.0-31.9, adult: Secondary | ICD-10-CM

## 2023-04-15 DIAGNOSIS — F3289 Other specified depressive episodes: Secondary | ICD-10-CM

## 2023-04-15 NOTE — Progress Notes (Signed)
.smr  Office: [phone]  /  Fax: [phone]  WEIGHT SUMMARY AND BIOMETRICS  Vitals Temp: 98 F (36.7 C) BP: 117/70 Pulse Rate: 68 SpO2: 98 %   Anthropometric Measurements Height: 5\' 4"  (1.626 m) Weight: 183 lb (83 kg) BMI (Calculated): 31.4 Weight at Last Visit: 183 lb Weight Lost Since Last Visit: 0 Weight Gained Since Last Visit: 0 Starting Weight: 190 lb Total Weight Loss (lbs): 7 lb (3.175 kg)   Body Composition  Body Fat %: 40 % Fat Mass (lbs): 73.4 lbs Muscle Mass (lbs): 104.4 lbs Total Body Water (lbs): 75.8 lbs Visceral Fat Rating : 10   Other Clinical Data Fasting: no Labs: no Today's Visit #: 10 Starting Date: 10/06/22     HPI  Chief Complaint: OBESITY  Yahira is here to discuss her progress with her obesity treatment plan. She is on the the Category 2 Plan and states she is following her eating plan approximately 40 % of the time. She states she is exercising /elliptical 30 minutes 3 times per week.   Interval History:  Since last office visit she maintained her weight, but increased muscle mass and decreased adipose mass Bio impedence scale reviewed with patient:  Up 1 lbs muscle mass Down 1.4 lbs adipose mass Down 0.4 lbs total body water Hunger/appetite-Fair control. Not fully satiated . Skipping some at lunch. We discussed the importance of regular meals and adequate protein intake for weight loss and overall health. Struggling with meal planning and preparation and gets off track . Cravings- Some increased cravings- sweet and salty. Discussed better snacking options and trying protein based snacks- 100 calorie protein snack options discussed and also things like [employer] chips and crackers  Exercise-increased - Elliptical consistently 3 times weekly  Has trip to [place] in November over Thanksgiving.    Pharmacotherapy: Topamax 25 mg at bedtime for emotional eating/cravings. No side effects on current dose, but did feel over sedated on  50 mg dose.   TREATMENT PLAN FOR OBESITY: Struggling with meal planning and prepping and then making poorer choices.  Discussed considering prepared meal plans like [employer] home delivery or [employer] or other options for ease of healthy food preparation. Total weight loss of 7 lbs.  TBW loss of 3.7% since 10/06/22. Working on increasing protein intake and building muscle mass.  Recommended Dietary Goals  Yetunde is currently in the action stage of change. As such, her goal is to continue weight management plan. She has agreed to the Category 2 Plan.  Behavioral Intervention  We discussed the following Behavioral Modification Strategies today: increasing lean protein intake, decreasing simple carbohydrates , increasing vegetables, increasing lower glycemic fruits, avoiding skipping meals, increasing water intake, decreasing eating out or consumption of processed foods, and making healthy choices when eating convenient foods, emotional eating strategies and understanding the difference between hunger signals and cravings, continue to practice mindfulness when eating, planning for success, and better snacking choices.  Additional resources provided today: NA  Recommended Physical Activity Goals  Shaketha has been advised to work up to 150 minutes of moderate intensity aerobic activity a week and strengthening exercises 2-3 times per week for cardiovascular health, weight loss maintenance and preservation of muscle mass.   She has agreed to Continue current level of physical activity    Pharmacotherapy We discussed various medication options to help Peyten with her weight loss efforts and we both agreed to continue topamax for emotional eating behaviors/cravings.    Return in about 6 weeks (around  05/27/2023).Marland Kitchen She was informed of the importance of frequent follow up visits to maximize her success with intensive lifestyle modifications for her multiple health  conditions.  PHYSICAL EXAM:  Blood pressure 117/70, pulse 68, temperature 98 F (36.7 C), height 5\' 4"  (1.626 m), weight 183 lb (83 kg), last menstrual period 12/18/2015, SpO2 98%. Body mass index is 31.41 kg/m.  General: She is overweight, cooperative, alert, well developed, and in no acute distress. PSYCH: Has normal mood, affect and thought process.   Cardiovascular: HR 60's regular, BP 117/70 Lungs: Normal breathing effort, no conversational dyspnea. Neuro: no focal deficit  DIAGNOSTIC DATA REVIEWED:  BMET    Component Value Date/Time   NA 141 10/06/2022 0927   K 4.6 10/06/2022 0927   CL 100 10/06/2022 0927   CO2 26 10/06/2022 0927   GLUCOSE 81 10/06/2022 0927   GLUCOSE 94 07/21/2022 1112   BUN 9 10/06/2022 0927   CREATININE 1.00 10/06/2022 0927   CREATININE 1.08 (H) 08/30/2017 0838   CALCIUM 9.9 10/06/2022 0927   GFRNONAA >60 07/21/2022 1112   GFRAA 59 (L) 09/11/2020 0923   Lab Results  Component Value Date   HGBA1C 5.4 10/06/2022   Lab Results  Component Value Date   INSULIN 7.1 10/06/2022   Lab Results  Component Value Date   TSH 1.300 10/06/2022   CBC    Component Value Date/Time   WBC 7.2 07/21/2022 1112   RBC 4.41 07/21/2022 1112   HGB 13.3 07/21/2022 1112   HGB 13.2 09/24/2021 0946   HCT 39.6 07/21/2022 1112   HCT 39.1 09/24/2021 0946   PLT 256 07/21/2022 1112   PLT 278 09/24/2021 0946   MCV 89.8 07/21/2022 1112   MCV 89 09/24/2021 0946   MCH 30.2 07/21/2022 1112   MCHC 33.6 07/21/2022 1112   RDW 12.8 07/21/2022 1112   RDW 12.3 09/24/2021 0946   Iron Studies No results found for: "IRON", "TIBC", "FERRITIN", "IRONPCTSAT" Lipid Panel     Component Value Date/Time   CHOL 222 (H) 10/06/2022 0927   TRIG 131 10/06/2022 0927   HDL 63 10/06/2022 0927   CHOLHDL 3.5 09/24/2021 0946   CHOLHDL 3.2 08/30/2017 0838   VLDL 28 08/12/2016 0917   LDLCALC 136 (H) 10/06/2022 0927   LDLCALC 127 (H) 08/30/2017 0838   Hepatic Function Panel      Component Value Date/Time   PROT 7.2 10/06/2022 0927   ALBUMIN 4.6 10/06/2022 0927   AST 15 10/06/2022 0927   ALT 20 10/06/2022 0927   ALKPHOS 92 10/06/2022 0927   BILITOT 0.6 10/06/2022 0927   BILIDIR 0.1 08/01/2015 0001   IBILI 0.5 08/01/2015 0001      Component Value Date/Time   TSH 1.300 10/06/2022 0927   Nutritional Lab Results  Component Value Date   VD25OH 45.2 10/06/2022   VD25OH 67.5 04/20/2018   VD25OH 59 08/12/2016    ASSOCIATED CONDITIONS ADDRESSED TODAY  ASSESSMENT AND PLAN  Problem List Items Addressed This Visit     Generalized obesity   Depression   BMI 31.0-31.9,adult Current BMI 31.9   Pure hypercholesterolemia - Primary   Hyperlipidemia LDL is not at goal. Medication(s): None Cardiovascular risk factors: dyslipidemia and obesity (BMI >= 30 kg/m2)  Lab Results  Component Value Date   CHOL 222 (H) 10/06/2022   HDL 63 10/06/2022   LDLCALC 136 (H) 10/06/2022   TRIG 131 10/06/2022   CHOLHDL 3.5 09/24/2021   CHOLHDL 3.2 08/30/2019   CHOLHDL 3.2 08/30/2017   Lab Results  Component Value Date   ALT 20 10/06/2022   AST 15 10/06/2022   ALKPHOS 92 10/06/2022   BILITOT 0.6 10/06/2022   The 10-year ASCVD risk score (Arnett DK, et al., 2019) is: 2%   Values used to calculate the score:     Age: 14 years     Sex: Female     Is Non-Hispanic African American: No     Diabetic: No     Tobacco smoker: No     Systolic Blood Pressure: 117 mmHg     Is BP treated: No     HDL Cholesterol: 63 mg/dL     Total Cholesterol: 222 mg/dL  Plan: Consider addition of statin medication if doses not make further improvements with further weight loss.  Continue to work on nutrition plan -decreasing simple carbohydrates, increasing lean proteins, decreasing saturated fats and cholesterol , avoiding trans fats and exercise as able to promote weight loss, improve lipids and decrease cardiovascular risks.  Other depression/emotional eating Dyllan has had issues  with stress/emotional eating. Currently this is fair to moderately controlled. Overall mood is stable. Medication(s): Topiramate 25 mg at bedtime. No side effects with this dose currently.   Plan: Continue Topiramate 25 mg nightly at bedtime.  Continue to work on emotional eating strategies.    ATTESTASTION STATEMENTS:  Reviewed by clinician on day of visit: allergies, medications, problem list, medical history, surgical history, family history, social history, and previous encounter notes.   I have personally spent 40 minutes total time today in preparation, patient care, nutritional counseling and documentation for this visit, including the following: review of clinical lab tests; review of medical tests/procedures/services.      Isidora Laham, PA-C

## 2023-05-13 ENCOUNTER — Other Ambulatory Visit (INDEPENDENT_AMBULATORY_CARE_PROVIDER_SITE_OTHER): Payer: Self-pay | Admitting: Physician Assistant

## 2023-05-13 ENCOUNTER — Other Ambulatory Visit: Payer: Self-pay | Admitting: Family Medicine

## 2023-05-13 DIAGNOSIS — F3289 Other specified depressive episodes: Secondary | ICD-10-CM

## 2023-05-13 DIAGNOSIS — G4700 Insomnia, unspecified: Secondary | ICD-10-CM

## 2023-05-13 NOTE — Telephone Encounter (Signed)
Patient needs refill please

## 2023-05-19 ENCOUNTER — Other Ambulatory Visit (INDEPENDENT_AMBULATORY_CARE_PROVIDER_SITE_OTHER): Payer: Self-pay | Admitting: Physician Assistant

## 2023-05-19 DIAGNOSIS — F3289 Other specified depressive episodes: Secondary | ICD-10-CM

## 2023-05-26 ENCOUNTER — Encounter (INDEPENDENT_AMBULATORY_CARE_PROVIDER_SITE_OTHER): Payer: Self-pay | Admitting: Physician Assistant

## 2023-05-26 ENCOUNTER — Ambulatory Visit (INDEPENDENT_AMBULATORY_CARE_PROVIDER_SITE_OTHER): Payer: BC Managed Care – PPO | Admitting: Physician Assistant

## 2023-05-26 VITALS — BP 123/77 | HR 59 | Temp 98.1°F | Ht 64.0 in | Wt 184.0 lb

## 2023-05-26 DIAGNOSIS — F3289 Other specified depressive episodes: Secondary | ICD-10-CM | POA: Diagnosis not present

## 2023-05-26 DIAGNOSIS — E669 Obesity, unspecified: Secondary | ICD-10-CM | POA: Diagnosis not present

## 2023-05-26 DIAGNOSIS — E785 Hyperlipidemia, unspecified: Secondary | ICD-10-CM | POA: Diagnosis not present

## 2023-05-26 DIAGNOSIS — E559 Vitamin D deficiency, unspecified: Secondary | ICD-10-CM | POA: Diagnosis not present

## 2023-05-26 DIAGNOSIS — Z6831 Body mass index (BMI) 31.0-31.9, adult: Secondary | ICD-10-CM

## 2023-05-26 DIAGNOSIS — E7800 Pure hypercholesterolemia, unspecified: Secondary | ICD-10-CM

## 2023-05-26 NOTE — Progress Notes (Signed)
.smr  Office: [phone]  /  Fax: [phone]  WEIGHT SUMMARY AND BIOMETRICS  Vitals Temp: 98.1 F (36.7 C) BP: 123/77 Pulse Rate: (!) 59 SpO2: 100 %   Anthropometric Measurements Height: 5\' 4"  (1.626 m) Weight: 184 lb (83.5 kg) BMI (Calculated): 31.57 Weight at Last Visit: 183 lb Weight Lost Since Last Visit: 0 Weight Gained Since Last Visit: 1 Starting Weight: 190 lb Total Weight Loss (lbs): 6 lb (2.722 kg)   Body Composition  Body Fat %: 41.5 % Fat Mass (lbs): 76.4 lbs Muscle Mass (lbs): 102.2 lbs Total Body Water (lbs): 76.6 lbs Visceral Fat Rating : 11   Other Clinical Data Fasting: no Labs: no Today's Visit #: 11 Starting Date: 10/06/22     HPI  Chief Complaint: OBESITY  Fanita is here to discuss her progress with her obesity treatment plan. She is on the the Category 2 Plan and states she is following her eating plan approximately 25 % of the time. She states she is exercising 30 minutes 2 times per week.   Interval History:  Since last office visit she is up 1 lb.  Hunger/appetite-no excessive hunger or appetite, but struggling with meal planning/prepping and struggling with motivation to continue the program . We discussed focusing on making positive changes to effect weight loss. She is struggling with the need for perfection and limited motivation to make changes. We discussed not all wins are dictated by the weight on the scale. See below.  Cravings- Not excessive Sleep- Feels mostly restorative Exercise-Started playing volleyball with league which is a huge step for her !!    Huge non scale win !  Has trip to [place] in November over Thanksgiving.   Pharmacotherapy: Topamax 25 mg at bedtime for emotional eating/cravings. No side effects on current dose, but did feel over sedated on 50 mg dose.   TREATMENT PLAN FOR OBESITY:  Recommended Dietary Goals  Olinda is currently in the action stage of change. As such, her goal is to continue  weight management plan. She has agreed to the Category 2 Plan.  Behavioral Intervention  We discussed the following Behavioral Modification Strategies today: increasing lean protein intake, decreasing simple carbohydrates , increasing vegetables, increasing lower glycemic fruits, increasing fiber rich foods, avoiding skipping meals, increasing water intake, decreasing eating out or consumption of processed foods, and making healthy choices when eating convenient foods, emotional eating strategies and understanding the difference between hunger signals and cravings, continue to practice mindfulness when eating, and planning for success.  Additional resources provided today: NA  Recommended Physical Activity Goals  Rashan has been advised to work up to 150 minutes of moderate intensity aerobic activity a week and strengthening exercises 2-3 times per week for cardiovascular health, weight loss maintenance and preservation of muscle mass.   She has agreed to Continue current level of physical activity  and Increase physical activity in their day and reduce sedentary time (increase NEAT).   Pharmacotherapy We discussed various medication options to help Emunah with her weight loss efforts and we both agreed to continue topamax to help with emotional eating/cravings.    No follow-ups on file.Marland Kitchen She was informed of the importance of frequent follow up visits to maximize her success with intensive lifestyle modifications for her multiple health conditions.  PHYSICAL EXAM:  Blood pressure 123/77, pulse (!) 59, temperature 98.1 F (36.7 C), height 5\' 4"  (1.626 m), weight 184 lb (83.5 kg), last menstrual period 12/18/2015, SpO2 100%. Body mass index is 31.58 kg/m.  General: She is overweight, cooperative, alert, well developed, and in no acute distress. PSYCH: Has normal mood, affect and thought process.   Cardiovascular: HR 50's BP 123/77 Lungs: Normal breathing effort, no conversational  dyspnea.  DIAGNOSTIC DATA REVIEWED:  BMET    Component Value Date/Time   NA 141 10/06/2022 0927   K 4.6 10/06/2022 0927   CL 100 10/06/2022 0927   CO2 26 10/06/2022 0927   GLUCOSE 81 10/06/2022 0927   GLUCOSE 94 07/21/2022 1112   BUN 9 10/06/2022 0927   CREATININE 1.00 10/06/2022 0927   CREATININE 1.08 (H) 08/30/2017 0838   CALCIUM 9.9 10/06/2022 0927   GFRNONAA >60 07/21/2022 1112   GFRAA 59 (L) 09/11/2020 0923   Lab Results  Component Value Date   HGBA1C 5.4 10/06/2022   Lab Results  Component Value Date   INSULIN 7.1 10/06/2022   Lab Results  Component Value Date   TSH 1.300 10/06/2022   CBC    Component Value Date/Time   WBC 7.2 07/21/2022 1112   RBC 4.41 07/21/2022 1112   HGB 13.3 07/21/2022 1112   HGB 13.2 09/24/2021 0946   HCT 39.6 07/21/2022 1112   HCT 39.1 09/24/2021 0946   PLT 256 07/21/2022 1112   PLT 278 09/24/2021 0946   MCV 89.8 07/21/2022 1112   MCV 89 09/24/2021 0946   MCH 30.2 07/21/2022 1112   MCHC 33.6 07/21/2022 1112   RDW 12.8 07/21/2022 1112   RDW 12.3 09/24/2021 0946   Iron Studies No results found for: "IRON", "TIBC", "FERRITIN", "IRONPCTSAT" Lipid Panel     Component Value Date/Time   CHOL 222 (H) 10/06/2022 0927   TRIG 131 10/06/2022 0927   HDL 63 10/06/2022 0927   CHOLHDL 3.5 09/24/2021 0946   CHOLHDL 3.2 08/30/2017 0838   VLDL 28 08/12/2016 0917   LDLCALC 136 (H) 10/06/2022 0927   LDLCALC 127 (H) 08/30/2017 0838   Hepatic Function Panel     Component Value Date/Time   PROT 7.2 10/06/2022 0927   ALBUMIN 4.6 10/06/2022 0927   AST 15 10/06/2022 0927   ALT 20 10/06/2022 0927   ALKPHOS 92 10/06/2022 0927   BILITOT 0.6 10/06/2022 0927   BILIDIR 0.1 08/01/2015 0001   IBILI 0.5 08/01/2015 0001      Component Value Date/Time   TSH 1.300 10/06/2022 0927   Nutritional Lab Results  Component Value Date   VD25OH 45.2 10/06/2022   VD25OH 67.5 04/20/2018   VD25OH 59 08/12/2016    ASSOCIATED CONDITIONS ADDRESSED  TODAY  ASSESSMENT AND PLAN  Problem List Items Addressed This Visit     Generalized obesity - Primary   Vitamin D deficiency   Depression   Pure hypercholesterolemia   Hyperlipidemia LDL is not at goal. HDL is at goal. Triglycerides are at goal.  Medication(s): None Cardiovascular risk factors: dyslipidemia and obesity (BMI >= 30 kg/m2)  Lab Results  Component Value Date   CHOL 222 (H) 10/06/2022   HDL 63 10/06/2022   LDLCALC 136 (H) 10/06/2022   TRIG 131 10/06/2022   CHOLHDL 3.5 09/24/2021   CHOLHDL 3.2 08/30/2019   CHOLHDL 3.2 08/30/2017   Lab Results  Component Value Date   ALT 20 10/06/2022   AST 15 10/06/2022   ALKPHOS 92 10/06/2022   BILITOT 0.6 10/06/2022   The 10-year ASCVD risk score (Arnett DK, et al., 2019) is: 2.2%   Values used to calculate the score:     Age: 72 years     Sex: Female  Is Non-Hispanic African American: No     Diabetic: No     Tobacco smoker: No     Systolic Blood Pressure: 123 mmHg     Is BP treated: No     HDL Cholesterol: 63 mg/dL     Total Cholesterol: 222 mg/dL  Plan: Continue to work on nutrition plan -decreasing simple carbohydrates, increasing lean proteins, decreasing saturated fats and cholesterol , avoiding trans fats and exercise as able to promote weight loss, improve lipids and decrease cardiovascular risks.     Vitamin D Deficiency Vitamin D is not at goal of 50.  Most recent vitamin D level was 45.2. She is on OTC vitamin D3 2000 IU daily. Lab Results  Component Value Date   VD25OH 45.2 10/06/2022   VD25OH 67.5 04/20/2018   VD25OH 59 08/12/2016    Plan: Continue OTC vitamin D3 2000 IU daily Low vitamin D levels can be associated with adiposity and may result in leptin resistance and weight gain. Also associated with fatigue. Currently on vitamin D supplementation without any adverse effects.  Recheck vitamin D levels over next 1-2 months.   Mood disorder/emotional eating Tegra has had issues with  stress/emotional eating. Currently this is poorly controlled. Overall mood is stable. Medication(s): Bupropion SR 150 mg daily in am and Celexa 20 mg daily. Per PCP.  Topamax 25 mg at bedtime daily- ? Helping with cravings. Was too sedated/fatigued with 50 mg daily.  Plan: Continue Topiramate 25 mg at bedtime . No refill needed today Continue bupropion and Celexa per PCP. Continue to work on emotional eating strategies.    ATTESTASTION STATEMENTS:  Reviewed by clinician on day of visit: allergies, medications, problem list, medical history, surgical history, family history, social history, and previous encounter notes.   I have personally spent 40 minutes total time today in preparation, patient care, nutritional counseling and documentation for this visit, including the following: review of clinical lab tests; review of medical tests/procedures/services.      Khaleel Beckom, PA-C

## 2023-06-30 ENCOUNTER — Encounter (INDEPENDENT_AMBULATORY_CARE_PROVIDER_SITE_OTHER): Payer: Self-pay | Admitting: Family Medicine

## 2023-06-30 ENCOUNTER — Ambulatory Visit (INDEPENDENT_AMBULATORY_CARE_PROVIDER_SITE_OTHER): Payer: BC Managed Care – PPO | Admitting: Family Medicine

## 2023-06-30 VITALS — BP 129/86 | HR 70 | Temp 98.2°F | Ht 64.0 in | Wt 186.0 lb

## 2023-06-30 DIAGNOSIS — F3289 Other specified depressive episodes: Secondary | ICD-10-CM

## 2023-06-30 DIAGNOSIS — Z6831 Body mass index (BMI) 31.0-31.9, adult: Secondary | ICD-10-CM | POA: Diagnosis not present

## 2023-06-30 DIAGNOSIS — E669 Obesity, unspecified: Secondary | ICD-10-CM | POA: Diagnosis not present

## 2023-06-30 DIAGNOSIS — Z6833 Body mass index (BMI) 33.0-33.9, adult: Secondary | ICD-10-CM

## 2023-06-30 MED ORDER — TOPIRAMATE 25 MG PO TABS
ORAL_TABLET | ORAL | 0 refills | Status: DC
Start: 2023-06-30 — End: 2023-07-14

## 2023-06-30 NOTE — Progress Notes (Signed)
Brandi Carpenter, D.O.  ABFM, ABOM Specializing in Clinical Bariatric Medicine  Office located at: [address] [place], [place]  [address]     Assessment and Plan:   Medications Discontinued During This Encounter  Medication Reason   fluconazole (DIFLUCAN) 150 MG tablet     Meds ordered this encounter  Medications   topiramate (TOPAMAX) 25 MG tablet    Sig: TAKE 1 TABLET(25 MG) BY MOUTH DAILY.    Dispense:  90 tablet    Refill:  0    **Patient requests 90 days supply**     Other depression, emotional eating behavior Assessment & Plan: Pt taking Topirimate 25 mg at nighttime and Wellbutrin 150 mg daily. No intolerances reported. Insulin was 7.1 when last checked on 10/06/2022 by Dr. Gus Rankin. Ideal fasting insulin level of less than 5 was reviewed with pt.  Pt reports not sleeping well. She doesn't drink much coffee. Drinks a Chiropodist and has a decaf coffee in the afternoons/nights. Discussed the importance of increasing protein to speed metabolism, increase lean muscle, decrease risk of cardiovascular disease or cancer, and to help stabilize sugars. After thorough discussion, she was not interested in managing her condition by eating more low carb vegetables and lean protein. Stressed the importance of staying well hydrated and drinking at least half of her weight in water (ounces). Answered all of Pts questions. Denies any SI/HI.   We mutually agreed to change her meal plan to category 1 with 1150-1250 calories and 80+ g of protein daily. I recommend journaling her daily calorie and protein intake to increase intentional and mindful eating. Pt shown multiple apps. Start measuring and weighing her foods to better track intake. Counseled on different alternatives to larger meals to fit her "graze eater" preference, such as eating multiple smaller meals throughout the day. We will continue to monitor closely alongside PCP / other specialists. Will refill Topamax 25 mg once daily  today.    BMI 33.0-33.9,adult Obesity with current BMI of 31.91 Assessment & Plan: Brandi Carpenter is here to discuss her progress with her obesity treatment plan along with follow-up of her obesity related diagnoses. See Medical Weight Management Flowsheet for complete bioelectrical impedance results.  Since last office visit with Shawn Rayburn, PA-C on 05/26/23 patient's muscle mass has increased by 1.6 lbs. Fat mass has increased by 0.2 lbs. Total body water has increased by 0.2 lbs.  Counseling done on how various foods will affect these numbers and how to maximize success  Total lbs lost to date: 4 lbs Total weight loss percentage to date: -2.11 %  Meal plan change: Change to Category 1 Plan - 1150-1250 calories and 80+ g of protein daily.  Behavioral Intervention Additional resources provided today: n/a Evidence-based interventions for health behavior change were utilized today including the discussion of self monitoring techniques, problem-solving barriers and SMART goal setting techniques.   Regarding patient's less desirable eating habits and patterns, we employed the technique of small changes.  Pt will specifically work on: start journaling calorie and protein intake every day and bring log for next visit.    FOLLOW UP: Return in about 2 weeks (around 07/14/2023). She was informed of the importance of frequent follow up visits to maximize her success with intensive lifestyle modifications for her multiple health conditions.  Subjective:   Chief complaint: Obesity Brandi Carpenter is here to discuss her progress with her obesity treatment plan. She is on the the Category 2 Plan and states she is  following her eating plan approximately 0% of the time. She states she is playing volleyball 30 minutes 1 day per week.  Interval History:  Brandi Carpenter is here for a follow up office visit. Since last OV,  she reports many stressors in her life lately to the point she is not actively  thinking about following her meal plan. States she is aware of calories but doesn't adhere to calorie limitations. Her biggest challenges are a lack of motivation and not wanting to prepare meals. Pt describes herself as a "graze eater", not the type to enjoy full meals. Was following protein intake suggestions by eating eggs at first. Has tried protein cereal, but doesn't enjoy them due to texture. Doesn't enjoy proteins with bones, so she mostly buys chicken breasts. States she feels her current food plan is too much food for her to eat, she doesn't want to eat that much.    Pharmacotherapy for weight loss: She is currently taking  Wellbutrin 150 mg and Topamax 25 mg  for medical weight loss.  Denies side effects.    Review of Systems:  Pertinent positives were addressed with patient today.  Reviewed by clinician on day of visit: allergies, medications, problem list, medical history, surgical history, family history, social history, and previous encounter notes.  Weight Summary and Biometrics   Weight Lost Since Last Visit: 0lb  Weight Gained Since Last Visit: 2lb   Vitals Temp: 98.2 F (36.8 C) BP: 129/86 Pulse Rate: 70 SpO2: 99 %   Anthropometric Measurements Height: 5\' 4"  (1.626 m) Weight: 186 lb (84.4 kg) BMI (Calculated): 31.91 Weight at Last Visit: 184lb Weight Lost Since Last Visit: 0lb Weight Gained Since Last Visit: 2lb Starting Weight: 190lb Total Weight Loss (lbs): 4 lb (1.814 kg) Peak Weight: 190lb   Body Composition  Body Fat %: 41.2 % Fat Mass (lbs): 76.6 lbs Muscle Mass (lbs): 103.8 lbs Total Body Water (lbs): 76.8 lbs Visceral Fat Rating : 11   Other Clinical Data Fasting: no Labs: no Today's Visit #: 12 Starting Date: 10/06/22    Objective:   PHYSICAL EXAM: Blood pressure 129/86, pulse 70, temperature 98.2 F (36.8 C), height 5\' 4"  (1.626 m), weight 186 lb (84.4 kg), last menstrual period 12/18/2015, SpO2 99%. Body mass index is 31.93  kg/m.  General: Well Developed, well nourished, and in no acute distress.  HEENT: Normocephalic, atraumatic Skin: Warm and dry, cap RF less 2 sec, good turgor Chest:  Normal excursion, shape, no gross abn Respiratory: speaking in full sentences, no conversational dyspnea NeuroM-Sk: Ambulates w/o assistance, moves * 4 Psych: A and O *3, insight good, mood-full  DIAGNOSTIC DATA REVIEWED:  BMET    Component Value Date/Time   NA 141 10/06/2022 0927   K 4.6 10/06/2022 0927   CL 100 10/06/2022 0927   CO2 26 10/06/2022 0927   GLUCOSE 81 10/06/2022 0927   GLUCOSE 94 07/21/2022 1112   BUN 9 10/06/2022 0927   CREATININE 1.00 10/06/2022 0927   CREATININE 1.08 (H) 08/30/2017 0838   CALCIUM 9.9 10/06/2022 0927   GFRNONAA >60 07/21/2022 1112   GFRAA 59 (L) 09/11/2020 0923   Lab Results  Component Value Date   HGBA1C 5.4 10/06/2022   Lab Results  Component Value Date   INSULIN 7.1 10/06/2022   Lab Results  Component Value Date   TSH 1.300 10/06/2022   CBC    Component Value Date/Time   WBC 7.2 07/21/2022 1112   RBC 4.41 07/21/2022 1112   HGB  13.3 07/21/2022 1112   HGB 13.2 09/24/2021 0946   HCT 39.6 07/21/2022 1112   HCT 39.1 09/24/2021 0946   PLT 256 07/21/2022 1112   PLT 278 09/24/2021 0946   MCV 89.8 07/21/2022 1112   MCV 89 09/24/2021 0946   MCH 30.2 07/21/2022 1112   MCHC 33.6 07/21/2022 1112   RDW 12.8 07/21/2022 1112   RDW 12.3 09/24/2021 0946   Iron Studies No results found for: "IRON", "TIBC", "FERRITIN", "IRONPCTSAT" Lipid Panel     Component Value Date/Time   CHOL 222 (H) 10/06/2022 0927   TRIG 131 10/06/2022 0927   HDL 63 10/06/2022 0927   CHOLHDL 3.5 09/24/2021 0946   CHOLHDL 3.2 08/30/2017 0838   VLDL 28 08/12/2016 0917   LDLCALC 136 (H) 10/06/2022 0927   LDLCALC 127 (H) 08/30/2017 0838   Hepatic Function Panel     Component Value Date/Time   PROT 7.2 10/06/2022 0927   ALBUMIN 4.6 10/06/2022 0927   AST 15 10/06/2022 0927   ALT 20  10/06/2022 0927   ALKPHOS 92 10/06/2022 0927   BILITOT 0.6 10/06/2022 0927   BILIDIR 0.1 08/01/2015 0001   IBILI 0.5 08/01/2015 0001      Component Value Date/Time   TSH 1.300 10/06/2022 4098   Nutritional Lab Results  Component Value Date   VD25OH 45.2 10/06/2022   VD25OH 67.5 04/20/2018   VD25OH 59 08/12/2016    Attestations:   Reviewed by clinician on day of visit: allergies, medications, problem list, medical history, surgical history, family history, social history, and previous encounter notes pertinent to patient's obesity diagnosis.    This encounter took 55 total minutes of time including time coordinating the above treatment plan, any pre-visit chart review and post-visit documentation and reviewing any labs and/or imaging, reviewing pertinent prior notes, and providing counseling the patient about such treatment plan.  Approximately 50% of this time was spent in direct, face-to-face counseling and coordination of care.   I, Isabelle Course, acting as a medical scribe for Thomasene Lot, DO., have compiled all relevant documentation for today's office visit on behalf of Thomasene Lot, DO, while in the presence of [employer], DO.  I have reviewed the above documentation for accuracy and completeness, and I agree with the above. Brandi Carpenter, D.O.  The 21st Century Cures Act was signed into law in 2016 which includes the topic of electronic health records.  This provides immediate access to information in MyChart.  This includes consultation notes, operative notes, office notes, lab results and pathology reports.  If you have any questions about what you read please let us know at your next visit so we can discuss your concerns and take corrective action if need be.  We are right here with you.

## 2023-07-14 ENCOUNTER — Ambulatory Visit (INDEPENDENT_AMBULATORY_CARE_PROVIDER_SITE_OTHER): Payer: BC Managed Care – PPO | Admitting: Family Medicine

## 2023-07-14 ENCOUNTER — Encounter (INDEPENDENT_AMBULATORY_CARE_PROVIDER_SITE_OTHER): Payer: Self-pay | Admitting: Family Medicine

## 2023-07-14 VITALS — BP 136/85 | HR 60 | Temp 97.9°F | Ht 64.0 in | Wt 185.0 lb

## 2023-07-14 DIAGNOSIS — F3289 Other specified depressive episodes: Secondary | ICD-10-CM | POA: Diagnosis not present

## 2023-07-14 DIAGNOSIS — Z6831 Body mass index (BMI) 31.0-31.9, adult: Secondary | ICD-10-CM | POA: Diagnosis not present

## 2023-07-14 DIAGNOSIS — E669 Obesity, unspecified: Secondary | ICD-10-CM

## 2023-07-14 MED ORDER — TOPIRAMATE 25 MG PO TABS
ORAL_TABLET | ORAL | Status: DC
Start: 2023-07-14 — End: 2023-07-28

## 2023-07-14 NOTE — Progress Notes (Signed)
[name], D.O.  ABFM, ABOM Specializing in Clinical Bariatric Medicine  Office located at: [address] [place], [place]  [address]     Assessment and Plan:   Medications Discontinued During This Encounter  Medication Reason   topiramate (TOPAMAX) 25 MG tablet    Meds ordered this encounter  Medications   topiramate (TOPAMAX) 25 MG tablet    Sig: TAKE 1 TABLET(25 MG) BID    **Patient requests 90 days supply**     Other depression, emotional eating behavior Assessment & Plan: Condition is stable. Hunger and cravings uncontrolled. Pt is taking Topiramate 25 mg BID. Tolerating well with no concerns or side effects reported. She previously was on 50 mg and endorsed intolerances such as fatigue. Since reducing her dose to 25 mg it has helped with her moods. Pt also taking Wellbutrin XL and Celexa per PCP. She endorses hunger and cravings. Cravings include sweets and chocolates. No SI/HI.   Given her prior intolerances on 50 mg BID of Topiramate, I advised her to space her medication at her new dose to avoid any side effects. I recommend she take 25 mg in the morning and 25 mg in the evening. Continue with current regimen of Wellbutrin and Celexa per PCP and Topiramate as directed by me. We will continue to monitor closely alongside PCP. Will refill Topiramate today.    Obesity- Start BMI 33.7 BMI 31.0-31.9,adult Current BMI 31.74 Assessment & Plan: Brandi Carpenter is here to discuss her progress with her obesity treatment plan along with follow-up of her obesity related diagnoses. See Medical Weight Management Flowsheet for complete bioelectrical impedance results.  Since last office visit on 06/30/23 patient's muscle mass has decreased by 0.6 lbs. Fat mass has increased by 0.4 lbs. Total body water has decreased by 0.2 lbs.  Counseling done on how various foods will affect these numbers and how to maximize success  Total lbs lost to date: 5 lbs  Total weight loss  percentage to date: -2.63 %  Pt was encouraged to promote a health-conscious environment to support healthier meal/snack choices while at work. Extensive counseling on healthy alternatives for snacks and meals that are also convenient and easily prepared. Pt was provided with many recipes to help her focus on healthy alternatives for lunch. I advised she prioritize high protein and low carb snacks. Encouraged [name] to avoid skipping any meals. When encouraged to drink protein shakes for breakfast, she reported having to eat something she can "chew on for texture reasons".   No change to meal plan - see Subjective  Behavioral Intervention Additional resources provided today: Recipes Evidence-based interventions for health behavior change were utilized today including the discussion of self monitoring techniques, problem-solving barriers and SMART goal setting techniques.   Regarding patient's less desirable eating habits and patterns, we employed the technique of small changes.  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    FOLLOW UP: Return in about 2 weeks (around 07/28/2023). She was informed of the importance of frequent follow up visits to maximize her success with intensive lifestyle modifications for her multiple health conditions.  Subjective:   Chief complaint: Obesity Brandi Carpenter is here to discuss her progress with her obesity treatment plan. She is on the Category 1 Plan and keeping a food journal and adhering to recommended goals of 1150-1250 calories and 80+ g of protein daily and  states she is following her eating plan approximately 50% of the time. She states she is not exercising.  Interval History:  Brandi Carpenter is here for a follow up office visit. Since last OV, she has been journaling and has found it to be helpful with mindfulness.  She is still using My[employer]itness[employer] to journal and adds meals even when having a "grazing lunch". Tends to graze all day. When referring to a "grazing lunch" she reports it being "whatever is in the cabinet that is easiest to grab and eat", which includes snacks such as Cheez-its. Of note, she works at [employer] and has one remote day a week. In her office, she has a cabinet with snacks that tend to be unhealthy and off-plan, but are easy and convenient for her. She also has a fridge in her office. She is texture sensitive with foods and reports enjoying the crunch in her snacks. Endorses hunger and cravings. She tried to prioritize her protein intake but stopped after about 4 days.   Pt reports a work trip to [place] over Thanksgiving break.   Pharmacotherapy for weight loss: She is currently taking Topamax and Wellbutrin for medical weight loss.  Denies side effects.    Review of Systems:  Pertinent positives were addressed with patient today.  Reviewed by clinician on day of visit: allergies, medications, problem list, medical history, surgical history, family history, social history, and previous encounter notes.  Weight Summary and Biometrics   Weight Lost Since Last Visit: 1lb  Weight Gained Since Last Visit: 0lb   Vitals Temp: 97.9 F (36.6 C) BP: 136/85 Pulse Rate: 60 SpO2: 100 %   Anthropometric Measurements Height: 5\' 4"  (1.626 m) Weight: 185 lb (83.9 kg) BMI (Calculated): 31.74 Weight at Last Visit: 186lb Weight Lost Since Last Visit: 1lb Weight Gained Since Last Visit: 0lb Starting Weight: 190lb Total Weight Loss (lbs): 5 lb (2.268 kg) Peak Weight: 190lb   Body Composition  Body Fat %: 41.1 % Fat Mass (lbs): 77 lbs Muscle Mass (lbs): 103.2 lbs Total Body Water (lbs): 76.6 lbs Visceral Fat Rating : 11   Other Clinical Data Fasting: no Labs: no Today's Visit #: 13 Starting Date: 10/06/22    Objective:   PHYSICAL EXAM: Blood pressure 136/85, pulse 60,  temperature 97.9 F (36.6 C), height 5\' 4"  (1.626 m), weight 185 lb (83.9 kg), last menstrual period 12/18/2015, SpO2 100%. Body mass index is 31.76 kg/m.  General: Well Developed, well nourished, and in no acute distress.  HEENT: Normocephalic, atraumatic Skin: Warm and dry, cap RF less 2 sec, good turgor Chest:  Normal excursion, shape, no gross abn Respiratory: speaking in full sentences, no conversational dyspnea NeuroM-Sk: Ambulates w/o assistance, moves * 4 Psych: A and O *3, insight good, mood-full  DIAGNOSTIC DATA REVIEWED:  BMET    Component Value Date/Time   NA 141 10/06/2022 0927   K 4.6 10/06/2022 0927   CL 100 10/06/2022 0927   CO2 26 10/06/2022 0927   GLUCOSE 81 10/06/2022 0927   GLUCOSE 94 07/21/2022 1112   BUN 9 10/06/2022 0927   CREATININE 1.00 10/06/2022 0927   CREATININE 1.08 (H) 08/30/2017 0838   CALCIUM 9.9 10/06/2022 0927   GFRNONAA >60 07/21/2022 1112   GFRAA 59 (L) 09/11/2020 0923   Lab Results  Component Value Date   HGBA1C 5.4 10/06/2022   Lab Results  Component Value Date   INSULIN 7.1 10/06/2022   Lab Results  Component Value Date   TSH 1.300 10/06/2022  CBC    Component Value Date/Time   WBC 7.2 07/21/2022 1112   RBC 4.41 07/21/2022 1112   HGB 13.3 07/21/2022 1112   HGB 13.2 09/24/2021 0946   HCT 39.6 07/21/2022 1112   HCT 39.1 09/24/2021 0946   PLT 256 07/21/2022 1112   PLT 278 09/24/2021 0946   MCV 89.8 07/21/2022 1112   MCV 89 09/24/2021 0946   MCH 30.2 07/21/2022 1112   MCHC 33.6 07/21/2022 1112   RDW 12.8 07/21/2022 1112   RDW 12.3 09/24/2021 0946   Iron Studies No results found for: "IRON", "TIBC", "FERRITIN", "IRONPCTSAT" Lipid Panel     Component Value Date/Time   CHOL 222 (H) 10/06/2022 0927   TRIG 131 10/06/2022 0927   HDL 63 10/06/2022 0927   CHOLHDL 3.5 09/24/2021 0946   CHOLHDL 3.2 08/30/2017 0838   VLDL 28 08/12/2016 0917   LDLCALC 136 (H) 10/06/2022 0927   LDLCALC 127 (H) 08/30/2017 0838   Hepatic  Function Panel     Component Value Date/Time   PROT 7.2 10/06/2022 0927   ALBUMIN 4.6 10/06/2022 0927   AST 15 10/06/2022 0927   ALT 20 10/06/2022 0927   ALKPHOS 92 10/06/2022 0927   BILITOT 0.6 10/06/2022 0927   BILIDIR 0.1 08/01/2015 0001   IBILI 0.5 08/01/2015 0001      Component Value Date/Time   TSH 1.300 10/06/2022 2956   Nutritional Lab Results  Component Value Date   VD25OH 45.2 10/06/2022   VD25OH 67.5 04/20/2018   VD25OH 59 08/12/2016    Attestations:   Reviewed by clinician on day of visit: allergies, medications, problem list, medical history, surgical history, family history, social history, and previous encounter notes pertinent to patient's obesity diagnosis.    This encounter took 40 total minutes of time including time coordinating the above treatment plan, any pre-visit chart review and post-visit documentation and reviewing any labs and/or imaging, reviewing pertinent prior notes, and providing counseling the patient about such treatment plan.  Approximately 50% of this time was spent in direct, face-to-face counseling and coordination of care.  I, Isabelle Course, acting as a medical scribe for Thomasene Lot, DO., have compiled all relevant documentation for today's office visit on behalf of Thomasene Lot, DO, while in the presence of [employer], DO.  I have reviewed the above documentation for accuracy and completeness, and I agree with the above. [name], D.O.  The 21st Century Cures Act was signed into law in 2016 which includes the topic of electronic health records.  This provides immediate access to information in MyChart.  This includes consultation notes, operative notes, office notes, lab results and pathology reports.  If you have any questions about what you read please let us know at your next visit so we can discuss your concerns and take corrective action if need be.  We are right here with you.

## 2023-07-28 ENCOUNTER — Ambulatory Visit (INDEPENDENT_AMBULATORY_CARE_PROVIDER_SITE_OTHER): Payer: BC Managed Care – PPO | Admitting: Family Medicine

## 2023-07-28 ENCOUNTER — Encounter (INDEPENDENT_AMBULATORY_CARE_PROVIDER_SITE_OTHER): Payer: Self-pay | Admitting: Family Medicine

## 2023-07-28 VITALS — BP 136/88 | HR 68 | Temp 98.3°F | Ht 64.0 in | Wt 186.0 lb

## 2023-07-28 DIAGNOSIS — Z6831 Body mass index (BMI) 31.0-31.9, adult: Secondary | ICD-10-CM | POA: Diagnosis not present

## 2023-07-28 DIAGNOSIS — E669 Obesity, unspecified: Secondary | ICD-10-CM

## 2023-07-28 DIAGNOSIS — F3289 Other specified depressive episodes: Secondary | ICD-10-CM

## 2023-07-28 NOTE — Progress Notes (Signed)
[name], D.O.  ABFM, ABOM Specializing in Clinical Bariatric Medicine  Office located at: [address] [place], [place]  [address]   Assessment and Plan:   FOR THE DISEASE OF OBESITY: BMI 31.0-31.9,adult Current BMI 31.91 Obesity- Start BMI 33.7 Since last office visit on 07/14/2023 patient's  Muscle mass has decreased by 0.2lb. Fat mass has increased by 1.2lb. Total body water has increased by 0.6lb.  Counseling done on how various foods will affect these numbers and how to maximize success  Total lbs lost to date: 4 Total weight loss percentage to date: 2.11%   Recommended Dietary Goals Shalika is currently in the action stage of change. As such, her goal is to continue weight management plan.  She has agreed to: continue current plan  - I recommended Quest chips, Protein one bars, and magic spoon bars.  - One [place] yogurt for every 2oz of meat.   Behavioral Intervention We discussed the following Behavioral Modification Strategies today: work on tracking and journaling calories using tracking application, avoiding temptations and identifying enticing environmental cues, better snacking choices, and staying on track while traveling and vacationing.  Additional resources provided today:  food log  Evidence-based interventions for health behavior change were utilized today including the discussion of self monitoring techniques, problem-solving barriers and SMART goal setting techniques.   Regarding patient's less desirable eating habits and patterns, we employed the technique of small changes.   Pt will specifically work on: Journal more accurately, find healthy options as snacks, and dummy proof office at work for next visit.   Recommended Physical Activity Goals Barbera has been advised to work up to 150 minutes of moderate intensity aerobic activity a week and strengthening exercises 2-3 times per week for cardiovascular health, weight loss maintenance and  preservation of muscle mass.   She has agreed to : Think about enjoyable ways to increase daily physical activity and overcoming barriers to exercise and Increase physical activity in their day and reduce sedentary time (increase NEAT).   Pharmacotherapy We discussed various medication options to help Besan with her weight loss efforts and we both agreed to : continue with nutritional and behavioral strategies   FOR ASSOCIATED CONDITIONS ADDRESSED TODAY: Other depression, emotional eating behavior Assessment: Condition is Improving, but not optimized.. Denies any SI/HI. Mood is stable. Pt informed me that her pet Berline Chough recently passed and she is still grieving.  Cravings and hunger are not well controlled. She continues to have hunger/cravings throughout the day especially during lunch time. Patient reports good compliance and tolerance of taking Wellbutrin and Celexa without difficulty. She informed me that she has stopped taking Topiramate after her dose reduction of 25mg  BID as this made her severely tired and developed muscle aches.   Plan: - Continue with Wellbutrin, and Celexa at current dose as directed by prescribing provider.   - Discontinue Topiramate due to adverse side effects.   - Discussed limiting tempting foods in her work place.  - Importance of not skipping meals and getting all her proteins and fiber in on a daily basis discussed.    - Importance of following up with PCP and others was stressed   - Reminded patient of the importance of following their prudent nutrition plan and how food can affect mood as well to support emotional wellbeing. Pt questions and concerns were answered.    FOLLOW UP: Return in about 2 weeks (around 08/11/2023). She was informed of the importance of frequent follow up visits  to maximize her success with intensive lifestyle modifications for her multiple health conditions.  Subjective:   Chief complaint: Obesity [place] is here to  discuss her progress with her obesity treatment plan. She is on the the Category 1 Plan and keeping a food journal and adhering to recommended goals of 1150-1250 calories and 80+ protein and states she is following her eating plan approximately 60% of the time. She states she is not exercising.  Interval History:  CATHLINE SITZE is here for a follow up office visit. Since last OV,  she has been well. Her goal last OV was to consistently journal and she has made a conscious effort to do that. She found it hard to be consistent with this and this allowed her to realize some of her unhealthy eating habits. She finds it hard to eat healthy at work as she has too many tempting snacks available. Pt did inform me that although she did journal she did not measure her calorie and protein intake 100% however she did write down some calories and proteins ranging from 1328cal with 65g, 1251cal with 90g, 1483cal with 95g, 1367cal with 56g, 1358cal with 38g, and 1352cal with 47g. She ate on plan at lunch about 1-2x a week. She informed me that she is going to [place] soon.   Barriers identified: none and exposure to enticing environments and/or relationships.   Pharmacotherapy for weight loss: She is currently taking Topiramate (off label use, single agent) experiencing the following side effects: severe fatigue, muscle aches..   Review of Systems:  Pertinent positives were addressed with patient today.  Reviewed by clinician on day of visit: allergies, medications, problem list, medical history, surgical history, family history, social history, and previous encounter notes.  Weight Summary and Biometrics   Weight Lost Since Last Visit: 0lb  Weight Gained Since Last Visit: 1lb    Vitals Temp: 98.3 F (36.8 C) BP: 136/88 Pulse Rate: 68 SpO2: 100 %   Anthropometric Measurements Height: 5\' 4"  (1.626 m) Weight: 186 lb (84.4 kg) BMI (Calculated): 31.91 Weight at Last Visit: 185lb Weight Lost Since Last  Visit: 0lb Weight Gained Since Last Visit: 1lb Starting Weight: 190lb Total Weight Loss (lbs): 4 lb (1.814 kg) Peak Weight: 190lb   Body Composition  Body Fat %: 41.9 % Fat Mass (lbs): 78.2 lbs Muscle Mass (lbs): 103 lbs Total Body Water (lbs): 77.2 lbs Visceral Fat Rating : 11   Other Clinical Data Fasting: no Labs: no Today's Visit #: 14 Starting Date: 10/06/22    Objective:   PHYSICAL EXAM: Blood pressure 136/88, pulse 68, temperature 98.3 F (36.8 C), height 5\' 4"  (1.626 m), weight 186 lb (84.4 kg), last menstrual period 12/18/2015, SpO2 100%. Body mass index is 31.93 kg/m.  General: she is overweight, cooperative and in no acute distress. PSYCH: Has normal mood, affect and thought process.   HEENT: EOMI, sclerae are anicteric. Lungs: Normal breathing effort, no conversational dyspnea. Extremities: Moves * 4 Neurologic: A and O * 3, good insight  DIAGNOSTIC DATA REVIEWED: BMET    Component Value Date/Time   NA 141 10/06/2022 0927   K 4.6 10/06/2022 0927   CL 100 10/06/2022 0927   CO2 26 10/06/2022 0927   GLUCOSE 81 10/06/2022 0927   GLUCOSE 94 07/21/2022 1112   BUN 9 10/06/2022 0927   CREATININE 1.00 10/06/2022 0927   CREATININE 1.08 (H) 08/30/2017 0838   CALCIUM 9.9 10/06/2022 0927   GFRNONAA >60 07/21/2022 1112   GFRAA 59 (L) 09/11/2020  1610   Lab Results  Component Value Date   HGBA1C 5.4 10/06/2022   Lab Results  Component Value Date   INSULIN 7.1 10/06/2022   Lab Results  Component Value Date   TSH 1.300 10/06/2022   CBC    Component Value Date/Time   WBC 7.2 07/21/2022 1112   RBC 4.41 07/21/2022 1112   HGB 13.3 07/21/2022 1112   HGB 13.2 09/24/2021 0946   HCT 39.6 07/21/2022 1112   HCT 39.1 09/24/2021 0946   PLT 256 07/21/2022 1112   PLT 278 09/24/2021 0946   MCV 89.8 07/21/2022 1112   MCV 89 09/24/2021 0946   MCH 30.2 07/21/2022 1112   MCHC 33.6 07/21/2022 1112   RDW 12.8 07/21/2022 1112   RDW 12.3 09/24/2021 0946   Iron  Studies No results found for: "IRON", "TIBC", "FERRITIN", "IRONPCTSAT" Lipid Panel     Component Value Date/Time   CHOL 222 (H) 10/06/2022 0927   TRIG 131 10/06/2022 0927   HDL 63 10/06/2022 0927   CHOLHDL 3.5 09/24/2021 0946   CHOLHDL 3.2 08/30/2017 0838   VLDL 28 08/12/2016 0917   LDLCALC 136 (H) 10/06/2022 0927   LDLCALC 127 (H) 08/30/2017 0838   Hepatic Function Panel     Component Value Date/Time   PROT 7.2 10/06/2022 0927   ALBUMIN 4.6 10/06/2022 0927   AST 15 10/06/2022 0927   ALT 20 10/06/2022 0927   ALKPHOS 92 10/06/2022 0927   BILITOT 0.6 10/06/2022 0927   BILIDIR 0.1 08/01/2015 0001   IBILI 0.5 08/01/2015 0001      Component Value Date/Time   TSH 1.300 10/06/2022 0927   Nutritional Lab Results  Component Value Date   VD25OH 45.2 10/06/2022   VD25OH 67.5 04/20/2018   VD25OH 59 08/12/2016    Attestations:    I,Jasmine M Lassiter,acting as a Neurosurgeon for Thomasene Lot, DO.,have documented all relevant documentation on the behalf of Thomasene Lot, DO,as directed by  Thomasene Lot, DO while in the presence of [employer], DO.  Reviewed by clinician on day of visit: allergies, medications, problem list, medical history, surgical history, family history, social history, and previous encounter notes pertinent to patient's obesity diagnosis. I have spent 40 minutes in the care of the patient today including: preparing to see patient (e.g. review and interpretation of tests, old notes ), obtaining and/or reviewing separately obtained history, performing a medically appropriate examination or evaluation, counseling and educating the patient, ordering medications, test or procedures, documenting clinical information in the electronic or other health care record, and independently interpreting results and communicating results to the patient, family, or caregiver   I have reviewed the above documentation for accuracy and completeness, and I agree with the above.  [name], D.O.  The 21st Century Cures Act was signed into law in 2016 which includes the topic of electronic health records.  This provides immediate access to information in MyChart.  This includes consultation notes, operative notes, office notes, lab results and pathology reports.  If you have any questions about what you read please let us know at your next visit so we can discuss your concerns and take corrective action if need be.  We are right here with you.

## 2023-08-02 ENCOUNTER — Other Ambulatory Visit: Payer: Self-pay | Admitting: Family Medicine

## 2023-08-02 DIAGNOSIS — G4700 Insomnia, unspecified: Secondary | ICD-10-CM

## 2023-08-02 DIAGNOSIS — F411 Generalized anxiety disorder: Secondary | ICD-10-CM

## 2023-08-02 MED ORDER — ZOLPIDEM TARTRATE 10 MG PO TABS
ORAL_TABLET | ORAL | 0 refills | Status: DC
Start: 2023-08-02 — End: 2023-12-07

## 2023-08-02 MED ORDER — ALPRAZOLAM 0.5 MG PO TABS: ORAL_TABLET | ORAL | 0 refills | Status: AC

## 2023-08-02 NOTE — Telephone Encounter (Signed)
This also came in under JCL's name.

## 2023-08-02 NOTE — Telephone Encounter (Signed)
Are these okay to refill? 

## 2023-08-04 NOTE — Progress Notes (Unsigned)
57 y.o. G0P0000 Domestic Partner Caucasian female here for annual exam.    Patient had a robotic hysterectomy with bilateral salpingo-oophorectomy last year for endometrial cancer.  Her final pathology report showed no residual disease. She is followed by Dr. Pricilla Holm at the [facility].  She has no concerns today.  On transdermal estrogen therapy, refilled in the last month.    Traveled to [place].  Going to [place] next year.  PCP: Joselyn Arrow, MD   Patient's last menstrual period was 12/18/2015.           Sexually active: No.  The current method of family planning is post menopausal status.    Menopausal hormone therapy:  ERT.   Exercising: No.   Smoker:  no  OB History  Gravida Para Term Preterm AB Living  0 0 0 0 0 0  SAB IAB Ectopic Multiple Live Births  0 0 0 0       HEALTH MAINTENANCE: Last 2 paps: 02/11/22 AGUS,  02-05-21 AGUS:Neg HR HPV, 01-23-20 Neg:Neg HR HPV, 12-26-18 AGUS:Neg HR HPV  History of abnormal Pap or positive HPV:  yes,  05-13-21 Pathology benign from hysteroscopy w/Myosure. 02-05-21 AGUS:Neg HR HPV, 04/17/21 colposcopy ECC benign, 01-06-19 colpo biopsies were normal and 12-29-18 EMB was benign showing EMB polyp. 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  Mammogram:   02/09/23 Breast Density cat C, BI-RADS CAT 1 neg Colonoscopy:  09/27/15 normal - due in 2027 Bone Density:  n/a  Result  n/a   Immunization History  Administered Date(s) Administered   Influenza Split 07/14/2014, 04/25/2016   Influenza, High Dose Seasonal PF 07/18/2021   Influenza,inj,Quad PF,6+ Mos 06/28/2013, 08/01/2015, 06/16/2019, 05/22/2020, 06/17/2022   Influenza-Unspecified 07/06/2017, 06/28/2018   Janssen (J&J) SARS-COV-2 Vaccination 11/23/2019   Moderna Covid-19 Vaccine Bivalent Booster 16yrs & up 09/24/2021   Moderna Sars-Covid-2 Vaccination 08/09/2020   Tdap 05/11/2012, 09/24/2021   Zoster Recombinant(Shingrix) 08/25/2018, 10/26/2018   Zoster, Live 04/25/2016       reports that she has never smoked. She has never used smokeless tobacco. She reports that she does not drink alcohol and does not use drugs.  Past Medical History:  Diagnosis Date   Abnormal Pap smear    ASCUS 08/1999; CIN 11/1999, s/p conization, 12-26-2018 AGUS   Allergic rhinitis, cause unspecified    Anxiety    Back pain    Basal cell carcinoma 06/12/2022   Complication of anesthesia    "difficulty awakening" remote past   DDD (degenerative disc disease), cervical 05/05/2018   Noted on imaging   DDD (degenerative disc disease), lumbar 06/08/2018   Noted on imaging   Depression    Endometrial polyp    Family history of adverse reaction to anesthesia    mother slow to awaken   GERD (gastroesophageal reflux disease)    Headache    Insomnia    PMB (postmenopausal bleeding) 05/08/2021   Post-nasal drip 05/08/2021   uses  mucinex daily for   Spondylolysis, 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 Noland Fordyce - [facility] - Moderate to severe dysplasia, negative margins   COLONOSCOPY  09/2015   COLPOSCOPY N/A 06/15/2022   Procedure: COLPOSCOPY;  Surgeon: Patton Salles, MD;  Location: [facility];  Service: Gynecology;  Laterality: N/A;   DILATATION & CURETTAGE/HYSTEROSCOPY WITH MYOSURE N/A 01/23/2019   Procedure: DILATATION & CURETTAGE/HYSTEROSCOPY WITH MYOSURE;  Surgeon: Patton Salles, MD;  Location: [facility];  Service: Gynecology;  Laterality: N/A;    DILATATION & CURETTAGE/HYSTEROSCOPY WITH MYOSURE N/A 05/13/2021   Procedure: DILATATION & CURETTAGE/HYSTEROSCOPY WITH MYOSURE RESECTION OF ENDOMETRIAL POLYP, ENDOCERVICAL CURETTAGE;  Surgeon: Patton Salles, MD;  Location: [facility];  Service: Gynecology;  Laterality: N/A;   DILATATION & CURETTAGE/HYSTEROSCOPY WITH MYOSURE N/A 06/15/2022   Procedure: DILATATION & CURETTAGE/HYSTEROSCOPY WITH MYOSURE;  Surgeon: Patton Salles, MD;  Location: [facility];  Service: Gynecology;  Laterality: N/A;   LEEP N/A 06/15/2022   Procedure: LOOP ELECTROSURGICAL EXCISION PROCEDURE (LEEP);  Surgeon: Patton Salles, MD;  Location: [facility];  Service: Gynecology;  Laterality: N/A;   LUMBAR DISC SURGERY  10/2018   LUMBAR MICRODISCECTOMY Right 11/09/2018   lumbar level:L5-S1 Utilizing microscope-Dr.Gary Cram   NASAL SEPTOPLASTY W/ TURBINOPLASTY Bilateral 10/07/2015   Procedure: NASAL SEPTOPLASTY WITH BILATERAL TURBINATE REDUCTION;  Surgeon: Newman Pies, MD;  Location: [facility];  Service: ENT;  Laterality: Bilateral;   PILONIDAL CYST EXCISION  1988   ROBOTIC ASSISTED TOTAL HYSTERECTOMY WITH BILATERAL SALPINGO OOPHERECTOMY Bilateral 07/30/2022   Procedure: XI ROBOTIC ASSISTED TOTAL HYSTERECTOMY WITH BILATERAL SALPINGO OOPHORECTOMY, POSSIBLE LAPAROTOMY;  Surgeon: Carver Fila, MD;  Location: [facility];  Service: Gynecology;  Laterality: Bilateral;   SENTINEL NODE BIOPSY N/A 07/30/2022   Procedure: SENTINEL NODE BIOPSY;  Surgeon: Carver Fila, MD;  Location: [facility];  Service: Gynecology;  Laterality: N/A;   WISDOM TOOTH EXTRACTION     yrs ago    Current Outpatient Medications  Medication Sig Dispense Refill   ALPRAZolam (XANAX) 0.5 MG tablet TAKE 1/2 TO 1 TABLET BY MOUTH THREE TIMES DAILY AS NEEDED FOR ANXIETY 20 tablet 0   buPROPion (WELLBUTRIN XL) 150 MG 24 hr tablet Take 150 mg by mouth daily.      Cholecalciferol (VITAMIN D) 2000 UNITS tablet Take 2,000 Units by mouth daily.     citalopram (CELEXA) 20 MG tablet Take 20 mg by mouth every evening.     diphenhydrAMINE (BENADRYL) 25 MG tablet Take 25 mg by mouth every 6 (six) hours as needed for allergies.     EPINEPHrine 0.3 mg/0.3 mL IJ SOAJ injection Inject 0.3 mg into the muscle as needed for anaphylaxis.     estradiol (VIVELLE-DOT) 0.05 MG/24HR patch APPLY 1 PATCH(0.05 MG) TOPICALLY TO THE SKIN 2 TIMES A WEEK 24 patch 1   famotidine (PEPCID) 20 MG tablet Take 20 mg by mouth daily.     fluticasone (FLONASE) 50 MCG/ACT nasal spray Place 2 sprays into both nostrils daily.  0   gabapentin (NEURONTIN) 400 MG capsule Take 400 mg by mouth every evening.     guaiFENesin (MUCINEX) 600 MG 12 hr tablet Take 1,200 mg by mouth 2 (two) times daily.     levocetirizine (XYZAL) 5 MG tablet TAKE 1 TABLET BY MOUTH EVERY EVENING 90 tablet 3   montelukast (SINGULAIR) 10 MG tablet Take 10 mg by mouth every evening.  Pseudoephedrine-DM-GG-APAP (SUDAFED COLD/COUGH PO) Take by mouth.     sodium chloride (OCEAN) 0.65 % SOLN nasal spray Place 1 spray into both nostrils daily.     zolpidem (AMBIEN) 10 MG tablet TAKE 1 TABLET(10 MG) BY MOUTH AT BEDTIME AS NEEDED FOR SLEEP 30 tablet 0   No current facility-administered medications for this visit.    ALLERGIES: Codeine and Pamelor [nortriptyline]  Family History  Problem Relation Age of Onset   Hypertension Mother    Diabetes Mother    GER disease Mother    Obesity Mother    Obesity Father    Heart disease Father    Sudden death Father    Brain cancer Sister        glioblastoma   Depression Sister    Hypertension Sister    Colon cancer Maternal Grandmother         died at 26   Diabetes Maternal Grandmother    Alzheimer's disease Paternal Grandmother    Colon polyps Maternal Uncle    GER disease Maternal Uncle    Breast cancer Cousin    Migraines Neg Hx    Ovarian cancer Neg Hx    Endometrial  cancer Neg Hx    Pancreatic cancer Neg Hx    Prostate cancer Neg Hx     Review of Systems  All other systems reviewed and are negative.   PHYSICAL EXAM:  BP 108/72 (BP Location: Left Arm, Patient Position: Sitting, Cuff Size: Normal)   Pulse 77   Ht 5' 4.5" (1.638 m)   Wt 190 lb (86.2 kg)   LMP 12/18/2015   SpO2 99%   BMI 32.11 kg/m     General appearance: alert, cooperative and appears stated age Head: normocephalic, without obvious abnormality, atraumatic Neck: no adenopathy, supple, symmetrical, trachea midline and thyroid normal to inspection and palpation Lungs: clear to auscultation bilaterally Breasts: normal appearance, no masses or tenderness, No nipple retraction or dimpling, No nipple discharge or bleeding, No axillary adenopathy Heart: regular rate and rhythm Abdomen: soft, non-tender; no masses, no organomegaly Extremities: extremities normal, atraumatic, no cyanosis or edema Skin: skin color, texture, turgor normal. No rashes or lesions Lymph nodes: cervical, supraclavicular, and axillary nodes normal. Neurologic: grossly normal  Pelvic: External genitalia:  no lesions              No abnormal inguinal nodes palpated.              Urethra:  normal appearing urethra with no masses, tenderness or lesions              Bartholins and Skenes: normal                 Vagina: normal appearing vagina with normal color and discharge, no lesions              Cervix:  absent              Pap taken: Yes.   Bimanual Exam:  Uterus:  absent              Adnexa: no mass, fullness, tenderness              Rectal exam: Yes.  .  Confirms.              Anus:  normal sphincter tone, no lesions  Chaperone was present for exam:  Warren Lacy, CMA  ASSESSMENT: Well woman visit with gynecologic exam. Hx endometrial cancer.  No evidence of recurrence.  Status post robotic-assisted laparoscopic total hysterectomy with bilateral salpingoophorectomy, SLN biopsy bilaterally.  Negative for  residual endometrial cancer.  Cervix benign chronic cervicitis. Fibroids noted.  Benign tubes and ovaries.  Hx prior CIN 3.  Status post conization in 2001. Vaginal cancer screening today. ERT.   PLAN: Mammogram screening discussed. Self breast awareness reviewed. Pap and HRV collected:  Yes.   Guidelines for Calcium, Vitamin D, regular exercise program including cardiovascular and weight bearing exercise. Medication refills:  none I reached out to Dr. Pricilla Holm regarding her estradiol usage and will await any recommendations before refilling the prescription.  Follow up:  1 year and prn.

## 2023-08-18 ENCOUNTER — Ambulatory Visit (INDEPENDENT_AMBULATORY_CARE_PROVIDER_SITE_OTHER): Payer: BC Managed Care – PPO | Admitting: Obstetrics and Gynecology

## 2023-08-18 ENCOUNTER — Other Ambulatory Visit (HOSPITAL_COMMUNITY)
Admission: RE | Admit: 2023-08-18 | Discharge: 2023-08-18 | Disposition: A | Discharge status: Home / Self Care | Payer: BC Managed Care – PPO | Source: Ambulatory Visit | Attending: Obstetrics and Gynecology | Admitting: Obstetrics and Gynecology

## 2023-08-18 ENCOUNTER — Encounter: Payer: Self-pay | Admitting: Obstetrics and Gynecology

## 2023-08-18 VITALS — BP 108/72 | HR 77 | Ht 64.5 in | Wt 190.0 lb

## 2023-08-18 DIAGNOSIS — Z1272 Encounter for screening for malignant neoplasm of vagina: Secondary | ICD-10-CM

## 2023-08-18 DIAGNOSIS — Z01419 Encounter for gynecological examination (general) (routine) without abnormal findings: Secondary | ICD-10-CM

## 2023-08-18 DIAGNOSIS — Z8542 Personal history of malignant neoplasm of other parts of uterus: Secondary | ICD-10-CM | POA: Diagnosis not present

## 2023-08-19 ENCOUNTER — Telehealth: Payer: Self-pay | Admitting: Obstetrics and Gynecology

## 2023-08-19 ENCOUNTER — Encounter (INDEPENDENT_AMBULATORY_CARE_PROVIDER_SITE_OTHER): Payer: Self-pay | Admitting: Family Medicine

## 2023-08-19 ENCOUNTER — Ambulatory Visit (INDEPENDENT_AMBULATORY_CARE_PROVIDER_SITE_OTHER): Payer: BC Managed Care – PPO | Admitting: Family Medicine

## 2023-08-19 ENCOUNTER — Other Ambulatory Visit: Payer: Self-pay | Admitting: Obstetrics and Gynecology

## 2023-08-19 VITALS — BP 135/82 | HR 67 | Temp 98.7°F | Ht 64.0 in | Wt 188.0 lb

## 2023-08-19 DIAGNOSIS — E669 Obesity, unspecified: Secondary | ICD-10-CM | POA: Diagnosis not present

## 2023-08-19 DIAGNOSIS — E559 Vitamin D deficiency, unspecified: Secondary | ICD-10-CM | POA: Diagnosis not present

## 2023-08-19 DIAGNOSIS — Z6832 Body mass index (BMI) 32.0-32.9, adult: Secondary | ICD-10-CM

## 2023-08-19 DIAGNOSIS — F3289 Other specified depressive episodes: Secondary | ICD-10-CM

## 2023-08-19 DIAGNOSIS — Z6831 Body mass index (BMI) 31.0-31.9, adult: Secondary | ICD-10-CM

## 2023-08-19 MED ORDER — ESTRADIOL 0.0375 MG/24HR TD PTTW: 1.0000 | MEDICATED_PATCH | TRANSDERMAL | 3 refills | Status: DC

## 2023-08-19 NOTE — Telephone Encounter (Signed)
Please contact patient with follow up to her annual exam yesterday.   Dr. Pricilla Holm would like to have her on the lowest dose of estrogen that controls her symptoms.   I recommend we try to lower her transdermal estradiol to Vivelle Dot 0.0375 mg twice weekly.  She can have a 90 day prescription with refills for one year.

## 2023-08-19 NOTE — Telephone Encounter (Signed)
New Rx sent to patient's pharmacy for Vivelle Dot 0.0375 mg twice weekly.  #24, RF 3.   Encounter closed.

## 2023-08-19 NOTE — Telephone Encounter (Signed)
-----   Message from Carver Fila sent at 08/18/2023  3:42 PM EST ----- Regarding: RE: transdermal estrogen therapy HI Arley Garant, I think overall her risk is low given low grade, early stage (polyp confined) disease. I would have her on the lowest dose to control symptoms. Happy Holidays! Best, kat ----- Message ----- From: Patton Salles, MD Sent: 08/18/2023   2:58 PM EST To: Raven Harmes Rosalin Hawking, MD; # Subject: transdermal estrogen therapy                   Hello Dr. Pricilla Holm,   I am checking in with you regarding continued transdermal estrogen therapy for our patient in common following hysterectomy.  She had no residual endometrial carcinoma at the time of her hysterectomy surgery.   It is controlling her vasomotor symptoms.   She also takes Gabapentin for restless legs and to help her with sleep quality.   I look forward to your response and hope your Holidays are going well.   Conley Simmonds, MD

## 2023-08-19 NOTE — Patient Instructions (Signed)

## 2023-08-19 NOTE — Progress Notes (Signed)
Brandi Carpenter, D.O.  ABFM, ABOM Specializing in Clinical Bariatric Medicine  Office located at: [address] [place], [place]  [address]   Assessment and Plan:   FOR THE DISEASE OF OBESITY: BMI 31.0-31.9,adult Current BMI 32.25 Obesity- Start BMI 33.7 Assessment & Plan: Since last office visit on 11/13/2 patient's muscle mass has increased by 1.6 lb. Fat mass has increased by 0.6 lb. Total body water has not changed. Counseling done on how various foods will affect these numbers and how to maximize success  Total lbs lost to date: 2 lbs  Total weight loss percentage to date: 1.05%    Recommended Dietary Goals Elisha is currently in the action stage of change. As such, her goal is to continue weight management plan.  She has agreed to: continue current plan   Behavioral Intervention We discussed the following today: increasing lean protein intake to established goals, keeping healthy foods at office, better snacking choices, exploring Long-Life meal prep, work on tracking foods.   Additional resources provided today:  handout on Food Journaling Log  Evidence-based interventions for health behavior change were utilized today including the discussion of self monitoring techniques, problem-solving barriers and SMART goal setting techniques.   Regarding patient's less desirable eating habits and patterns, we employed the technique of small changes.   Pt will specifically work on: eating everything on plan for lunch, keeping healthy snacks at office space, bringing in food log.    Recommended Physical Activity Goals Sherriann has been advised to work up to 150 minutes of moderate intensity aerobic activity a week and strengthening exercises 2-3 times per week for cardiovascular health, weight loss maintenance and preservation of muscle mass.   She has agreed to : Continue current level of physical activity    Pharmacotherapy We both agreed to : continue current  anti-obesity medication regimen   FOR ASSOCIATED CONDITIONS ADDRESSED TODAY:  Other depression, emotional eating behavior Assessment & Plan: Current mood medication regiment: Wellbutrin & Celexa. No complaints of emotional eating behavior since LOV.  Mood stable. She will continue with all medications at current doses.  Reminded patient of the importance of following their prudent nutrition plan and how food can affect mood as well to support emotional wellbeing.    Vitamin D deficiency Assessment & Plan: Most recent vitamin D: Lab Results  Component Value Date   VD25OH 45.2 10/06/2022   VD25OH 67.5 04/20/2018   VD25OH 59 08/12/2016   She is currently on OTC Cholecalciferol 2,000 units daily. Continue with current supplementation regiment and weight loss efforts. Vitamin D recheck; future.    Follow up:   Return 09/02/23. She was informed of the importance of frequent follow up visits to maximize her success with intensive lifestyle modifications for her multiple health conditions.  Subjective:   Chief complaint: Obesity [name] is here to discuss her progress with her obesity treatment plan. She is on the Category 1 Plan and keeping a food journal and adhering to recommended goals of 1150-1250 calories and 80+ protein and states she is following her eating plan approximately 25% of the time. She states she is not exercising.  Interval History:  JODIANNE KRUPICKA is here for a follow up office visit. Since last OV, Stormey is up 2 lbs. She recently returned from an [place] trip. She endorses "eating whatever I liked" on vacation. She has somewhat gotten back on track with her meal plan since returning last Saturday. Does report still snacking sometimes at work.  Pharmacotherapy for weight loss: She is currently taking  Wellbutrin XL 150 mg once daily .   Review of Systems:  Pertinent positives were addressed with patient today.  Reviewed by clinician on day of visit: allergies,  medications, problem list, medical history, surgical history, family history, social history, and previous encounter notes.  Weight Summary and Biometrics   Weight Lost Since Last Visit: 0lb  Weight Gained Since Last Visit: 2lb  Vitals Temp: 98.7 F (37.1 C) BP: 135/82 Pulse Rate: 67 SpO2: 99 %   Anthropometric Measurements Height: 5\' 4"  (1.626 m) Weight: 188 lb (85.3 kg) BMI (Calculated): 32.25 Weight at Last Visit: 186lb Weight Lost Since Last Visit: 0lb Weight Gained Since Last Visit: 2lb Starting Weight: 190lb Total Weight Loss (lbs): 2 lb (0.907 kg) Peak Weight: 190lb   Body Composition  Body Fat %: 41.4 % Fat Mass (lbs): 77.8 lbs Muscle Mass (lbs): 104.6 lbs Total Body Water (lbs): 77.2 lbs Visceral Fat Rating : 11   Other Clinical Data Fasting: no Labs: no Today's Visit #: 15 Starting Date: 10/06/22   Objective:   PHYSICAL EXAM: Blood pressure 135/82, pulse 67, temperature 98.7 F (37.1 C), height 5\' 4"  (1.626 m), weight 188 lb (85.3 kg), last menstrual period 12/18/2015, SpO2 99%. Body mass index is 32.27 kg/m.  General: she is overweight, cooperative and in no acute distress. PSYCH: Has normal mood, affect and thought process.   HEENT: EOMI, sclerae are anicteric. Lungs: Normal breathing effort, no conversational dyspnea. Extremities: Moves * 4 Neurologic: A and O * 3, good insight  DIAGNOSTIC DATA REVIEWED: BMET    Component Value Date/Time   NA 141 10/06/2022 0927   K 4.6 10/06/2022 0927   CL 100 10/06/2022 0927   CO2 26 10/06/2022 0927   GLUCOSE 81 10/06/2022 0927   GLUCOSE 94 07/21/2022 1112   BUN 9 10/06/2022 0927   CREATININE 1.00 10/06/2022 0927   CREATININE 1.08 (H) 08/30/2017 0838   CALCIUM 9.9 10/06/2022 0927   GFRNONAA >60 07/21/2022 1112   GFRAA 59 (L) 09/11/2020 0923   Lab Results  Component Value Date   HGBA1C 5.4 10/06/2022   Lab Results  Component Value Date   INSULIN 7.1 10/06/2022   Lab Results  Component  Value Date   TSH 1.300 10/06/2022   CBC    Component Value Date/Time   WBC 7.2 07/21/2022 1112   RBC 4.41 07/21/2022 1112   HGB 13.3 07/21/2022 1112   HGB 13.2 09/24/2021 0946   HCT 39.6 07/21/2022 1112   HCT 39.1 09/24/2021 0946   PLT 256 07/21/2022 1112   PLT 278 09/24/2021 0946   MCV 89.8 07/21/2022 1112   MCV 89 09/24/2021 0946   MCH 30.2 07/21/2022 1112   MCHC 33.6 07/21/2022 1112   RDW 12.8 07/21/2022 1112   RDW 12.3 09/24/2021 0946   Iron Studies No results found for: "IRON", "TIBC", "FERRITIN", "IRONPCTSAT" Lipid Panel     Component Value Date/Time   CHOL 222 (H) 10/06/2022 0927   TRIG 131 10/06/2022 0927   HDL 63 10/06/2022 0927   CHOLHDL 3.5 09/24/2021 0946   CHOLHDL 3.2 08/30/2017 0838   VLDL 28 08/12/2016 0917   LDLCALC 136 (H) 10/06/2022 0927   LDLCALC 127 (H) 08/30/2017 0838   Hepatic Function Panel     Component Value Date/Time   PROT 7.2 10/06/2022 0927   ALBUMIN 4.6 10/06/2022 0927   AST 15 10/06/2022 0927   ALT 20 10/06/2022 0927   ALKPHOS 92 10/06/2022 0927  BILITOT 0.6 10/06/2022 0927   BILIDIR 0.1 08/01/2015 0001   IBILI 0.5 08/01/2015 0001      Component Value Date/Time   TSH 1.300 10/06/2022 1610   Nutritional Lab Results  Component Value Date   VD25OH 45.2 10/06/2022   VD25OH 67.5 04/20/2018   VD25OH 59 08/12/2016    Attestations:   I, Special Puri, acting as a Stage manager for Thomasene Lot, DO., have compiled all relevant documentation for today's office visit on behalf of Thomasene Lot, DO, while in the presence of [employer], DO.  Reviewed by clinician on day of visit: allergies, medications, problem list, medical history, surgical history, family history, social history, and previous encounter notes pertinent to patient's obesity diagnosis. I have spent 31 minutes in the care of the patient today including: preparing to see patient (e.g. review and interpretation of tests, old notes ), obtaining and/or reviewing  separately obtained history, performing a medically appropriate examination or evaluation, counseling and educating the patient, ordering medications, test or procedures, documenting clinical information in the electronic or other health care record, and independently interpreting results and communicating results to the patient, family, or caregiver   I have reviewed the above documentation for accuracy and completeness, and I agree with the above. Brandi Carpenter, D.O.  The 21st Century Cures Act was signed into law in 2016 which includes the topic of electronic health records.  This provides immediate access to information in MyChart.  This includes consultation notes, operative notes, office notes, lab results and pathology reports.  If you have any questions about what you read please let us know at your next visit so we can discuss your concerns and take corrective action if need be.  We are right here with you.

## 2023-08-23 LAB — CYTOLOGY - PAP
Comment: NEGATIVE
Diagnosis: NEGATIVE
High risk HPV: NEGATIVE

## 2023-09-02 ENCOUNTER — Ambulatory Visit (INDEPENDENT_AMBULATORY_CARE_PROVIDER_SITE_OTHER): Payer: BC Managed Care – PPO | Admitting: Family Medicine

## 2023-09-02 ENCOUNTER — Encounter (INDEPENDENT_AMBULATORY_CARE_PROVIDER_SITE_OTHER): Payer: Self-pay

## 2023-09-30 ENCOUNTER — Encounter (INDEPENDENT_AMBULATORY_CARE_PROVIDER_SITE_OTHER): Payer: Self-pay | Admitting: Family Medicine

## 2023-09-30 ENCOUNTER — Ambulatory Visit (INDEPENDENT_AMBULATORY_CARE_PROVIDER_SITE_OTHER): Payer: 59 | Admitting: Family Medicine

## 2023-09-30 VITALS — BP 123/73 | HR 67 | Temp 97.9°F | Ht 64.0 in | Wt 190.0 lb

## 2023-09-30 DIAGNOSIS — E669 Obesity, unspecified: Secondary | ICD-10-CM

## 2023-09-30 DIAGNOSIS — Z6832 Body mass index (BMI) 32.0-32.9, adult: Secondary | ICD-10-CM

## 2023-09-30 DIAGNOSIS — E88819 Insulin resistance, unspecified: Secondary | ICD-10-CM

## 2023-09-30 DIAGNOSIS — Z6831 Body mass index (BMI) 31.0-31.9, adult: Secondary | ICD-10-CM

## 2023-09-30 DIAGNOSIS — E538 Deficiency of other specified B group vitamins: Secondary | ICD-10-CM | POA: Diagnosis not present

## 2023-09-30 DIAGNOSIS — E559 Vitamin D deficiency, unspecified: Secondary | ICD-10-CM

## 2023-09-30 NOTE — Progress Notes (Signed)
[name], D.O.  ABFM, ABOM Specializing in Clinical Bariatric Medicine  Office located at: [address] [place], [place]  [address]   Assessment and Plan:   FOR THE DISEASE OF OBESITY: BMI 31.0-31.9,adult Current BMI 32.6 Obesity- Start BMI 33.7 Assessment & Plan: Since last office visit on 08/19/23 patient's muscle mass has decreased by 2.2lb. Fat mass has increased by 4.4lb. Total body water has increased by 1lb.  Counseling done on how various foods will affect these numbers and how to maximize success  Total lbs lost to date: 0 lbs Total weight loss percentage to date: 0%   Recommended Dietary Goals Brandi Carpenter is currently in the action stage of change. As such, her goal is to continue weight management plan.  She has agreed to: continue current plan   Behavioral Intervention We discussed the following today: increasing lean protein intake to established goals, avoiding skipping meals, increasing water intake , work on tracking and journaling calories using tracking application, work on managing stress, creating time for self-care and relaxation, better snacking choices, and discussed pre-packaged healthier meals such as [employer] Natural, Whole 30 chicken meals, Longlife meal prep and Factor Meals etc  Additional resources provided today: Food log, levels of support handout, and crock pot/slow cooker recipes.    Evidence-based interventions for health behavior change were utilized today including the discussion of self monitoring techniques, problem-solving barriers and SMART goal setting techniques.   Regarding patient's less desirable eating habits and patterns, we employed the technique of small changes.   Pt will specifically work on: eating everything on plan for lunch, having only healthy snacks in her office snack drawer, and bringing in food log for next visit.    Recommended Physical Activity Goals Brandi Carpenter has been advised to work up to 150 minutes  of moderate intensity aerobic activity a week and strengthening exercises 2-3 times per week for cardiovascular health, weight loss maintenance and preservation of muscle mass.   She has agreed to :  Think about enjoyable ways to increase daily physical activity and overcoming barriers to exercise   Pharmacotherapy We discussed various medication options to help Brandi Carpenter with her weight loss efforts and we both agreed to:  continue with nutritional and behavioral strategies and continue current anti-obesity medication regimen  Pt is on Wellbutrin XL 150 mg once daily for mood management.  FOR ASSOCIATED CONDITIONS ADDRESSED TODAY:  Insulin resistance Assessment & Plan: Lab Results  Component Value Date   HGBA1C 5.4 10/06/2022   INSULIN 7.1 10/06/2022    Hunger/cravings well controlled. Not currently on any medication, taking diet/exercise approach. Brandi Carpenter has been journaling about 1400 calories per day and 60-70 g of protein (still below goal). The highest her calories got up to was 1700 calories -- only occasionally when eating off-plan. She has not found it difficult to plan even when eating off plan, however, over the holidays she was not journaling every day. Reports occasional eating when she is bored or unable to sleep. Denies any skipped meals. Eating more high protein snacks ([employer]) and has less unhealthy snacks in her office snack drawer.  Extensive counseling on alternative recipes to favorite foods that are still on plan. I recommend she try meal prepping or prepped meals ([employer]). Continue to track what she eats, even when eating off plan.    B12 deficiency due to diet Assessment & Plan: Lab Results  Component Value Date   VITAMINB12 343 10/06/2022   B12 is sub-optimal at  343. Pt classifies at vitamin B12 deficient due to diet. Pt is not currently taking any B12 supplements. No changes made today. We will continue to monitor her condition and plan to recheck  B12 in the future.    Vitamin D deficiency Assessment & Plan: Lab Results  Component Value Date   VD25OH 45.2 10/06/2022   VD25OH 67.5 04/20/2018   VD25OH 59 08/12/2016   Pt vitamin D is slightly below goal of 50+, especially in the winter time was reviewed with the pt. Complaint with Cholecalciferol 2000 units once daily. Tolerating well, no adverse side effects. Continue with supplementation regimen. No changes today. We will continue to monitor her condition as it relates to her weight loss journey.    Follow up:   Return in about 2 weeks (around 10/14/2023). She was informed of the importance of frequent follow up visits to maximize her success with intensive lifestyle modifications for her multiple health conditions.  Subjective:   Chief complaint: Obesity Brandi Carpenter is here to discuss her progress with her obesity treatment plan. She is on the the Category 1 Plan and keeping a food journal and adhering to recommended goals of 1150-1250 calories and 80+ g of protein and states she is following her eating plan approximately 60% of the time. She states she is not exercising.  Interval History:  Brandi Carpenter is here for a follow up office visit. Since last OV, she is up 2 lbs. She reports eating more on-plan than, other than eating "a lot of deserts". Trying to consume more protein at lunch time. Has decreased appetite during lunch time and has to make herself with.   Barriers identified: Lack of partner or family support (now doing GroupMe accountability group), having difficulty with meal prep and planning (today pt mentions she has taken our advise and), having difficulty focusing on healthy eating, and difficulty implementing reduced calorie nutrition plan.  Pharmacotherapy for weight loss: She is currently taking Bupropion (single agent, off label use) with adequate clinical response  and without side effects..   Review of Systems:  Pertinent positives were addressed with  patient today.  Reviewed by clinician on day of visit: allergies, medications, problem list, medical history, surgical history, family history, social history, and previous encounter notes.  Weight Summary and Biometrics   Weight Lost Since Last Visit: 0  Weight Gained Since Last Visit: 2 lb    Vitals Temp: 97.9 F (36.6 C) BP: 123/73 Pulse Rate: 67 SpO2: 100 %   Anthropometric Measurements Height: 5\' 4"  (1.626 m) Weight: 190 lb (86.2 kg) BMI (Calculated): 32.6 Weight at Last Visit: 188 lb Weight Lost Since Last Visit: 0 Weight Gained Since Last Visit: 2 lb Starting Weight: 190 lb Total Weight Loss (lbs): 0 lb (0 kg) Peak Weight: 190 lb   Body Composition  Body Fat %: 43.3 % Fat Mass (lbs): 82.2 lbs Muscle Mass (lbs): 102.4 lbs Total Body Water (lbs): 78.2 lbs Visceral Fat Rating : 11   Other Clinical Data Fasting: No Labs: No Today's Visit #: 16 Starting Date: 10/06/22    Objective:   PHYSICAL EXAM: Blood pressure 123/73, pulse 67, temperature 97.9 F (36.6 C), height 5\' 4"  (1.626 m), weight 190 lb (86.2 kg), last menstrual period 12/18/2015, SpO2 100%. Body mass index is 32.61 kg/m.  General: she is overweight, cooperative and in no acute distress. PSYCH: Has normal mood, affect and thought process.   HEENT: EOMI, sclerae are anicteric. Lungs: Normal breathing effort, no conversational dyspnea. Extremities: Moves *  4 Neurologic: A and O * 3, good insight  DIAGNOSTIC DATA REVIEWED: BMET    Component Value Date/Time   NA 141 10/06/2022 0927   K 4.6 10/06/2022 0927   CL 100 10/06/2022 0927   CO2 26 10/06/2022 0927   GLUCOSE 81 10/06/2022 0927   GLUCOSE 94 07/21/2022 1112   BUN 9 10/06/2022 0927   CREATININE 1.00 10/06/2022 0927   CREATININE 1.08 (H) 08/30/2017 0838   CALCIUM 9.9 10/06/2022 0927   GFRNONAA >60 07/21/2022 1112   GFRAA 59 (L) 09/11/2020 0923   Lab Results  Component Value Date   HGBA1C 5.4 10/06/2022   Lab Results   Component Value Date   INSULIN 7.1 10/06/2022   Lab Results  Component Value Date   TSH 1.300 10/06/2022   CBC    Component Value Date/Time   WBC 7.2 07/21/2022 1112   RBC 4.41 07/21/2022 1112   HGB 13.3 07/21/2022 1112   HGB 13.2 09/24/2021 0946   HCT 39.6 07/21/2022 1112   HCT 39.1 09/24/2021 0946   PLT 256 07/21/2022 1112   PLT 278 09/24/2021 0946   MCV 89.8 07/21/2022 1112   MCV 89 09/24/2021 0946   MCH 30.2 07/21/2022 1112   MCHC 33.6 07/21/2022 1112   RDW 12.8 07/21/2022 1112   RDW 12.3 09/24/2021 0946   Iron Studies No results found for: "IRON", "TIBC", "FERRITIN", "IRONPCTSAT" Lipid Panel     Component Value Date/Time   CHOL 222 (H) 10/06/2022 0927   TRIG 131 10/06/2022 0927   HDL 63 10/06/2022 0927   CHOLHDL 3.5 09/24/2021 0946   CHOLHDL 3.2 08/30/2017 0838   VLDL 28 08/12/2016 0917   LDLCALC 136 (H) 10/06/2022 0927   LDLCALC 127 (H) 08/30/2017 0838   Hepatic Function Panel     Component Value Date/Time   PROT 7.2 10/06/2022 0927   ALBUMIN 4.6 10/06/2022 0927   AST 15 10/06/2022 0927   ALT 20 10/06/2022 0927   ALKPHOS 92 10/06/2022 0927   BILITOT 0.6 10/06/2022 0927   BILIDIR 0.1 08/01/2015 0001   IBILI 0.5 08/01/2015 0001      Component Value Date/Time   TSH 1.300 10/06/2022 0927   Nutritional Lab Results  Component Value Date   VD25OH 45.2 10/06/2022   VD25OH 67.5 04/20/2018   VD25OH 59 08/12/2016    Attestations:   I, Isabelle Course, acting as a Stage manager for Thomasene Lot, DO., have compiled all relevant documentation for today's office visit on behalf of Thomasene Lot, DO, while in the presence of [employer], DO.  Reviewed by clinician on day of visit: allergies, medications, problem list, medical history, surgical history, family history, social history, and previous encounter notes pertinent to patient's obesity diagnosis.  I have spent 40 minutes in the care of the patient today including: preparing to see patient  (e.g. review and interpretation of tests, old notes ), obtaining and/or reviewing separately obtained history, performing a medically appropriate examination or evaluation, counseling and educating the patient, ordering medications, test or procedures, documenting clinical information in the electronic or other health care record, and independently interpreting results and communicating results to the patient, family, or caregiver   I have reviewed the above documentation for accuracy and completeness, and I agree with the above. [name], D.O.  The 21st Century Cures Act was signed into law in 2016 which includes the topic of electronic health records.  This provides immediate access to information in MyChart.  This includes consultation notes, operative notes,  office notes, lab results and pathology reports.  If you have any questions about what you read please let us know at your next visit so we can discuss your concerns and take corrective action if need be.  We are right here with you.

## 2023-10-01 ENCOUNTER — Encounter (INDEPENDENT_AMBULATORY_CARE_PROVIDER_SITE_OTHER): Payer: Self-pay | Admitting: Family Medicine

## 2023-10-04 NOTE — Patient Instructions (Signed)

## 2023-10-04 NOTE — Progress Notes (Unsigned)
No chief complaint on file.  Brandi Carpenter is a 58 y.o. female who presents for a complete physical.   She is under the care of Dr. Edward Jolly and Dr. Pricilla Holm (gyn-onc).  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  She is s/p definitive surgery 07/2022 with no residual carcinoma.  She is on HRT per Dr. Edward Jolly, whom she last saw for GYN exam in 08/2023. No nausea, breast tenderness or vaginal discharge.  Denies hot flashes or night sweats.  Depression/Anxiety and insomnia:   She is seeing psychiatrist.  She is on wellbutrin and citalopram, doing well. (She had SE of feeling swollen from other meds that had been tried.) She was sleeping well on gabapentin 400mg , only occasionally needing ambien, when on estrogen.  At one point last year, when she stopped the hormones, her sleep got much worse.   She continues on gabapentin 400 mg at bedtime. Currently she is need ambien ***UPDATE Ambien was last refilled #30 in 07/2023 Last refilled alprazolam #20 in 07/2023. Uses it mainly for dentalwork.  Restless legs--recurred after gabapentin stopped after her back surgery in 2020.  RLS resolved since restarting gabapentin.   GERD:  She has been taking famotidine once daily (rarely also in the evening, only prn), and that has been controlling her symptoms.  Denies heartburn, hiccups, dysphagia.  Chronic back pain, s/p microdisckectomy 10/2019 by Dr. Wynetta Emery. Last MRI 03/2020. Prolonged standing, and certain positions aggravate her back pain.  Not needing pain meds. Back has been pretty good, sometimes flares with yardwork (repetitive bending, to pick up sticks).  Stretches help, sometimes will take advil or aleve,  It could take a week, then back to baseline. Denies numbness, tingling or weakness.  Doesn't do any home exercises or core strengthening.   Allergies:  She is under the care of allergist, getting weekly allergy shots. She is also using Flonase, Xyzal and Singulair, as  well as using saline rinses and mucinex.   She is s/p septoplasty and inferior turbinate reductions by Dr. Suszanne Conners in 2017. She saw ENT (Dr. Ernestene Kiel) in August 2023 with complaints of nasal congestion. Endoscopy demonstrated significant reduction in normal inferior turbinate tissue (R>L). Improvement in subjective nasal airflow with cotton test consistent with empty nose syndrome. He recommended against treatment with fillers due to potential risks. Keeping it moist with regular use of saline helps.  Obesity: She is [facility]  Wt Readings from Last 3 Encounters:  09/30/23 190 lb (86.2 kg)  08/19/23 188 lb (85.3 kg)  08/18/23 190 lb (86.2 kg)   Sister has glioblastoma, doing okay.  Had to stop chemo.    Immunization History  Administered Date(s) Administered   Influenza Split 07/14/2014, 04/25/2016   Influenza, High Dose Seasonal PF 07/18/2021   Influenza,inj,Quad PF,6+ Mos 06/28/2013, 08/01/2015, 06/16/2019, 05/22/2020, 06/17/2022   Influenza-Unspecified 07/06/2017, 06/28/2018   Janssen (J&J) SARS-COV-2 Vaccination 11/23/2019   Moderna Covid-19 Vaccine Bivalent Booster 90yrs & up 09/24/2021   Moderna Sars-Covid-2 Vaccination 08/09/2020   Tdap 05/11/2012, 09/24/2021   Zoster Recombinant(Shingrix) 08/25/2018, 10/26/2018   Zoster, Live 04/25/2016   Last Pap smear: 08/2023 normal, negative HR HPV. Yearly pap recommended Last mammogram: 01/2023 Last colonoscopy: 09/2015 with Dr. Christella Hartigan, normal.  Last DEXA: never   Dentist: twice a year Ophtho:  Due, has glasses for distance (prev just used readers).   Exercise:    "not a lot", no cardio or current weight-bearing exercise.  Enjoys yardwork, also cares for her mom's yard.  Lipids: Lab Results  Component Value Date   CHOL 222 (H) 10/06/2022   HDL 63 10/06/2022   LDLCALC 136 (H) 10/06/2022   TRIG 131 10/06/2022   CHOLHDL 3.5 09/24/2021   Vitamin D-OH  45.2 in 09/2022.   PMH, PSH, SH  and FH reviewed and updated    ROS: The patient denies headaches, anorexia, fever, vision changes, decreased hearing, ear pain, sore throat, breast concerns, chest pain, palpitations, dizziness, syncope, dyspnea on exertion, cough, swelling, nausea, vomiting, diarrhea, constipation, abdominal pain, melena, hematochezia, hematuria, incontinence, dysuria, vaginal bleeding, discharge, odor or itch, genital lesions, weakness, tremor, suspicious skin lesions, abnormal bleeding/bruising, or enlarged lymph nodes.    Insomnia persists, per HPI.  Moods are good. Hot flashes resolved with HRT Back pain is rare/sporadic RLS resolved Heartburn resolved with pepcid, rare. Denies hair/skin/bowel changes Sees dermatologist.   PHYSICAL EXAM:  LMP 12/18/2015   Wt Readings from Last 3 Encounters:  09/30/23 190 lb (86.2 kg)  08/19/23 188 lb (85.3 kg)  08/18/23 190 lb (86.2 kg)    General Appearance:    Alert, cooperative, no distress, appears stated age. She did not change into gown for exam today.   Head:    Normocephalic, without obvious abnormality, atraumatic     Eyes:    PERRL, conjunctiva/corneas clear, EOM's intact, fundi benign     Ears:    Normal TM's and external ear canals..  Nose:    Normal, no drainage or sinus tenderness  Throat:    Normal mucosa  Neck:    Supple, no lymphadenopathy; thyroid: no enlargement/ tenderness/nodules; no carotid bruit or JVD     Back:    Spine nontender, no curvature, ROM normal, no CVA tenderness    Lungs:    Clear to auscultation bilaterally without wheezes, rales or ronchi; respirations unlabored     Chest Wall:    No tenderness or deformity     Heart:    Regular rate and rhythm, S1 and S2 normal, no murmur, rub or gallop     Breast Exam:    Deferred to GYN   Abdomen:    Soft, non-tender, nondistended, normoactive bowel sounds, no masses, no hepatosplenomegaly     Genitalia:    Deferred to GYN   Rectal:    Deferred to GYN   Extremities:    No clubbing,  cyanosis or edema.  Pulses:    2+ and symmetric all extremities     Skin:    Skin color, texture, turgor normal, no rashes. (limited exam, not changed into gown)  Lymph nodes:   Cervical, supraclavicular nodes normal    Neurologic:    Normal strength, sensation and gait; reflexes 2+ and symmetric throughout                   Psych:   Normal mood, full range of affect, normal hygiene and grooming      10/01/2022    8:40 AM 09/24/2021    8:44 AM 09/11/2020    8:41 AM 05/22/2020   11:09 AM 08/30/2019   10:58 AM  Depression screen PHQ 2/9  Decreased Interest 0 0 0 0 0  Down, Depressed, Hopeless 0 0 0 0 0  PHQ - 2 Score 0 0 0 0 0  Altered sleeping 1 2   2   Tired, decreased energy 1 2   0  Change in appetite 0 2   0  Feeling bad or failure about  yourself  0 3   0  Trouble concentrating 0 0   0  Moving slowly or fidgety/restless 0 2   0  Suicidal thoughts 0 0   0  PHQ-9 Score 2 11   2   Difficult doing work/chores Not difficult at all Somewhat difficult       ASSESSMENT/PLAN:  PHQ-9 Enter/offer flu/COVID vaccines  Is she going to get labs from [facility], or from [place]? Last labs 09/2022 were ordered by them.   Discussed monthly self breast exams and yearly mammograms; at least 30 minutes of aerobic activity at least 5 days/week, weight-bearing exercise at least 2x/wk; proper sunscreen use reviewed; healthy diet, including goals of calcium and vitamin D intake and alcohol recommendations (less than or equal to 1 drink/day) reviewed; regular seatbelt use; changing batteries in smoke detectors.  Immunization recommendations discussed--continue yearly flu shots.   Colonoscopy recommendations reviewed--UTD   F/u 1 year CPE

## 2023-10-06 ENCOUNTER — Encounter: Payer: Self-pay | Admitting: Family Medicine

## 2023-10-06 ENCOUNTER — Ambulatory Visit (INDEPENDENT_AMBULATORY_CARE_PROVIDER_SITE_OTHER): Payer: BC Managed Care – PPO | Admitting: Family Medicine

## 2023-10-06 VITALS — BP 120/70 | HR 68 | Ht 65.0 in | Wt 191.2 lb

## 2023-10-06 DIAGNOSIS — Z6831 Body mass index (BMI) 31.0-31.9, adult: Secondary | ICD-10-CM

## 2023-10-06 DIAGNOSIS — G4700 Insomnia, unspecified: Secondary | ICD-10-CM | POA: Diagnosis not present

## 2023-10-06 DIAGNOSIS — Z0000 Encounter for general adult medical examination without abnormal findings: Secondary | ICD-10-CM | POA: Diagnosis not present

## 2023-10-06 DIAGNOSIS — F419 Anxiety disorder, unspecified: Secondary | ICD-10-CM | POA: Diagnosis not present

## 2023-10-06 DIAGNOSIS — F32A Depression, unspecified: Secondary | ICD-10-CM

## 2023-10-06 DIAGNOSIS — G2581 Restless legs syndrome: Secondary | ICD-10-CM

## 2023-10-06 DIAGNOSIS — J301 Allergic rhinitis due to pollen: Secondary | ICD-10-CM

## 2023-11-01 ENCOUNTER — Encounter (INDEPENDENT_AMBULATORY_CARE_PROVIDER_SITE_OTHER): Payer: Self-pay | Admitting: Psychology

## 2023-11-01 ENCOUNTER — Encounter (INDEPENDENT_AMBULATORY_CARE_PROVIDER_SITE_OTHER): Payer: Self-pay | Admitting: Family Medicine

## 2023-11-01 ENCOUNTER — Encounter: Payer: Self-pay | Admitting: Family Medicine

## 2023-11-01 ENCOUNTER — Ambulatory Visit (INDEPENDENT_AMBULATORY_CARE_PROVIDER_SITE_OTHER): Payer: 59 | Admitting: Family Medicine

## 2023-11-01 ENCOUNTER — Ambulatory Visit: Payer: 59 | Admitting: Family Medicine

## 2023-11-01 VITALS — BP 120/70 | HR 60 | Temp 98.2°F | Ht 64.0 in | Wt 193.0 lb

## 2023-11-01 VITALS — BP 129/83 | HR 60 | Temp 97.5°F | Ht 64.0 in | Wt 188.0 lb

## 2023-11-01 DIAGNOSIS — E7849 Other hyperlipidemia: Secondary | ICD-10-CM | POA: Diagnosis not present

## 2023-11-01 DIAGNOSIS — M545 Low back pain, unspecified: Secondary | ICD-10-CM | POA: Diagnosis not present

## 2023-11-01 DIAGNOSIS — E88819 Insulin resistance, unspecified: Secondary | ICD-10-CM

## 2023-11-01 DIAGNOSIS — F3289 Other specified depressive episodes: Secondary | ICD-10-CM

## 2023-11-01 DIAGNOSIS — F5089 Other specified eating disorder: Secondary | ICD-10-CM

## 2023-11-01 DIAGNOSIS — E559 Vitamin D deficiency, unspecified: Secondary | ICD-10-CM

## 2023-11-01 DIAGNOSIS — Z6832 Body mass index (BMI) 32.0-32.9, adult: Secondary | ICD-10-CM

## 2023-11-01 DIAGNOSIS — M62830 Muscle spasm of back: Secondary | ICD-10-CM

## 2023-11-01 DIAGNOSIS — E538 Deficiency of other specified B group vitamins: Secondary | ICD-10-CM

## 2023-11-01 DIAGNOSIS — E669 Obesity, unspecified: Secondary | ICD-10-CM

## 2023-11-01 LAB — POCT URINALYSIS DIP (PROADVANTAGE DEVICE)
Bilirubin, UA: NEGATIVE
Blood, UA: NEGATIVE
Glucose, UA: NEGATIVE mg/dL
Ketones, POC UA: NEGATIVE mg/dL
Leukocytes, UA: NEGATIVE
Nitrite, UA: NEGATIVE
Protein Ur, POC: NEGATIVE mg/dL
Specific Gravity, Urine: 1.01
Urobilinogen, Ur: 0.2
pH, UA: 6 (ref 5.0–8.0)

## 2023-11-01 MED ORDER — METHOCARBAMOL 500 MG PO TABS
500.0000 mg | ORAL_TABLET | Freq: Three times a day (TID) | ORAL | 0 refills | Status: AC | PRN
Start: 2023-11-01 — End: ?

## 2023-11-01 MED ORDER — KETOROLAC TROMETHAMINE 60 MG/2ML IM SOLN
60.0000 mg | Freq: Once | INTRAMUSCULAR | Status: AC
Start: 2023-11-01 — End: 2023-11-01
  Administered 2023-11-01: 60 mg via INTRAMUSCULAR

## 2023-11-01 MED ORDER — MELOXICAM 15 MG PO TABS: 15.0000 mg | ORAL_TABLET | Freq: Every day | ORAL | 0 refills | Status: DC

## 2023-11-01 NOTE — Progress Notes (Signed)
 Chief Complaint  Patient presents with   Back Pain    Started Thursday with sharp stabbing pain left back, under ribs. Radiates to the front a bit. Worsened over the weekend. Tried some patches over the weekend, helped some. Tried tylenol/ibu did not make much of a difference. Laying down helps. Standing is ok. Sitting is the worse but today all positions are painful.    Returned from [place] on 2/9.  No injuries, change in activity, no pain during trip or flight.  2/13 she started with one sharp, stabbing pain at left back muscles.  Then a more constant pain started. Pain gradually worse over the last 3-4 days. Over the weekend she noticed some radiation of the pain around to the front, up under the ribs on the left, which has been constant. It is somewhat tender to touch. Not aware of any rash.  Used heat patches--helped temporarily. She tried doing stretches, didn't help. She took 800 mg of ibuprofen twice, without much benefit (on Thursday and Friday). She switched to Tylenol, can't tell how much that helped.  Hurts most to sit. Over the weekend she was fine standing (doesn't feel as good today standing). No pain when laying down, even when laying on the left side. No radiation down the leg. No pain with position changes, reaching.    PMH, PSH, SH reviewed  Outpatient Encounter Medications as of 11/01/2023  Medication Sig Note   acetaminophen (TYLENOL) 500 MG tablet Take 1,000 mg by mouth every 6 (six) hours as needed. 11/01/2023: 1000mg  this am   buPROPion (WELLBUTRIN XL) 150 MG 24 hr tablet Take 150 mg by mouth daily.    Cholecalciferol (VITAMIN D) 2000 UNITS tablet Take 2,000 Units by mouth daily.    citalopram (CELEXA) 20 MG tablet Take 20 mg by mouth every evening.    diphenhydrAMINE (BENADRYL) 25 MG tablet Take 50 mg by mouth every 6 (six) hours as needed for allergies. 11/01/2023: Took 2 last night   estradiol (VIVELLE-DOT) 0.0375 MG/24HR Place 1 patch onto the skin 2 (two)  times a week.    famotidine (PEPCID) 20 MG tablet Take 20 mg by mouth daily.    fluticasone (FLONASE) 50 MCG/ACT nasal spray Place 2 sprays into both nostrils daily.    gabapentin (NEURONTIN) 400 MG capsule Take 400 mg by mouth every evening.    guaiFENesin (MUCINEX) 600 MG 12 hr tablet Take 1,200 mg by mouth 2 (two) times daily.    ibuprofen (ADVIL) 200 MG tablet Take 800 mg by mouth every 6 (six) hours as needed. 11/01/2023: Last dose Thursday   levocetirizine (XYZAL) 5 MG tablet TAKE 1 TABLET BY MOUTH EVERY EVENING    meloxicam (MOBIC) 15 MG tablet Take 1 tablet (15 mg total) by mouth daily. Take with food. Take until your pain is better    methocarbamol (ROBAXIN) 500 MG tablet Take 1-2 tablets (500-1,000 mg total) by mouth every 8 (eight) hours as needed for muscle spasms.    montelukast (SINGULAIR) 10 MG tablet Take 10 mg by mouth every evening.    sodium chloride (OCEAN) 0.65 % SOLN nasal spray Place 1 spray into both nostrils daily.    zolpidem (AMBIEN) 10 MG tablet TAKE 1 TABLET(10 MG) BY MOUTH AT BEDTIME AS NEEDED FOR SLEEP 11/01/2023: As needed, about 2 times a week.   ALPRAZolam (XANAX) 0.5 MG tablet TAKE 1/2 TO 1 TABLET BY MOUTH THREE TIMES DAILY AS NEEDED FOR ANXIETY (Patient not taking: Reported on 11/01/2023) 11/01/2023: As needed  EPINEPHrine 0.3 mg/0.3 mL IJ SOAJ injection Inject 0.3 mg into the muscle as needed for anaphylaxis. (Patient not taking: Reported on 11/01/2023)    Pseudoephedrine-DM-GG-APAP (SUDAFED COLD/COUGH PO) Take by mouth. (Patient not taking: Reported on 11/01/2023) 11/01/2023: As needed   [EXPIRED] ketorolac (TORADOL) injection 60 mg     No facility-administered encounter medications on file as of 11/01/2023.   NOT taking meloxicam or methocarbamol prior to today's visit  Allergies  Allergen Reactions   Codeine Itching and Nausea And Vomiting   Pamelor [Nortriptyline] Tinitus    ROS:No numbness, tingling or weakness. No dysuria, hematuria or dark urine. No  fever or chills. No URI symptoms, cough, shortness of breath. No pleuritic pain. No HA/dizziness, chest pain.     PHYSICAL EXAM:  BP 120/70   Pulse 60   Temp 98.2 F (36.8 C) (Tympanic)   Ht 5\' 4"  (1.626 m)   Wt 193 lb (87.5 kg)   LMP 12/18/2015   BMI 33.13 kg/m   Pleasant female, who appears comfortable when laying down, but fidgety and needing to stand or lay down when seated for any length of time HEENT: conjunctiva and sclera are clear, EOMI. Neck: no lymphadenopathy or mass, nontender Back: Spine and SI joints nontender. No CVA tenderness She has some tightness and tenderness at the Left QL/paraspinous muscles. She is tender along the inferior margin of the lateral ribs. There is a rectangular area of erythema and some flaking from recent pain patch.  No rash or lesions outside of this rectangle (nothing extending anteriorly where she describes radiation of pain). Slight discomfort (pulling sensation) at L lower back with forward flexion. Abdomen: soft, nontender, no organomegaly or mass Extremities: no edema Psych: normal mood, affect, hygiene and grooming  Urine dip: SG 1.010, negative blood/nitrite/leuks/protein   ASSESSMENT/PLAN:  Acute left-sided low back pain without sciatica - Ddx reviewed--muscle spasm, MSK most likely, kidney (doubt, nl urine), shingles (no rash yet), referred pain from comp fx (doubt, no spinal tenderness) - Plan: POCT Urinalysis DIP (Proadvantage Device), meloxicam (MOBIC) 15 MG tablet, ketorolac (TORADOL) injection 60 mg  Muscle spasm of back - Plan: methocarbamol (ROBAXIN) 500 MG tablet, ketorolac (TORADOL) injection 60 mg  Thoracolumbar back pain, with some focal tenderness at inferior ribs and tightness of lumbar/QL muscles. Irritation from adhesive from pain patch. Normal urine.  Shown stretches. To start meloxicam and methocarbamol. Consider f/u with sports med if not improving, vs PT   Stay well hydrated. Stop the ibuprofen and  instead take meloxicam once daily with food. Take this daily until you are completely better (you may not need the whole #15 that are in the bottle). Take the muscle relaxant as needed (methocarbamol). I would stay away from the patches since you are having irritation from the adhesive. You can use heating pad, and/or topical medications such as Biofreeze, CBD oil, etc. You can use a 1% hydrocortisone cream to the area of rash if it is itchy or causing discomfort.  Do not take any ibuprofen/advil/motrin/naproxen/Aleve/Goody or BC powder while taking the meloxicam. You may use tylenol products as needed.   Try some of the stretches discussed, after using some heat.

## 2023-11-01 NOTE — Progress Notes (Signed)
 [name], D.O.  ABFM, ABOM Specializing in Clinical Bariatric Medicine  Office located at: [address] [place], [place]  [address]   Assessment and Plan:   Orders Placed This Encounter  Procedures   Comprehensive metabolic panel   CBC with Differential/Platelet   Hemoglobin A1c   Insulin, random   Lipid panel   TSH   T4, free   VITAMIN D 25 Hydroxy (Vit-D Deficiency, Fractures)   Vitamin B12    Labs obtained today (CMP, CBC, A1C, insulin, lipid panel, TSH, T4, Vit D, and B12) will be reviewed at her next OV.   FOR THE DISEASE OF OBESITY: BMI 32.0-32.9,adult  - Current BMI 32.25 Obesity- Start BMI 33.7 Assessment & Plan: Since last office visit on 09/30/23 patient's muscle mass has increased by 3.2lb. Fat mass has decreased by 4.8lb. Total body water has decreased by 3.2lb.  Counseling done on how various foods will affect these numbers and how to maximize success  Total lbs lost to date: 2 lbs Total weight loss percentage to date: -1.05%    Recommended Dietary Goals Nehemiah is currently in the action stage of change. As such, her goal is to continue weight management plan.  She has agreed to: continue current plan   Behavioral Intervention We discussed the following today: increasing lean protein intake to established goals, increasing water intake , work on meal planning and preparation, keeping healthy foods at home, avoiding temptations and identifying enticing environmental cues, better snacking choices, and discussed pre-packaged healthier meals such as [employer] All Natural, Whole 30 chicken meals, Longlife meal prep and Factor Meals etc  Additional resources provided today: Handout on practicing mindfulness around eating  Evidence-based interventions for health behavior change were utilized today including the discussion of self monitoring techniques, problem-solving barriers and SMART goal setting techniques.   Regarding patient's less desirable  eating habits and patterns, we employed the technique of small changes.   Pt will specifically work on: Walking at least 2-3 days a week, meal prepping and planning for dinner, and not skipping lunch for next visit.    Recommended Physical Activity Goals Jadalyn has been advised to work up to 150 minutes of moderate intensity aerobic activity a week and strengthening exercises 2-3 times per week for cardiovascular health, weight loss maintenance and preservation of muscle mass.   She has agreed to :  Think about enjoyable ways to increase daily physical activity and overcoming barriers to exercise, Increase physical activity in their day and reduce sedentary time (increase NEAT)., and Start walking at least 2 days per week.    Pharmacotherapy We both agreed to: continue with nutritional and behavioral strategies and adequate clinical response to current dose, continue current regimen Pt is on Wellbutrin XL 150 mg once daily    FOR ASSOCIATED CONDITIONS ADDRESSED TODAY: Insulin resistance Assessment & Plan: Lab Results  Component Value Date   HGBA1C 5.4 10/06/2022   INSULIN 7.1 10/06/2022    No meds currently. Healthy diet/exercise approach. Not currently exercising. Has sometimes been skipping lunch. Endorses cravings, stating she always feels "snacky".   Discussed the option of starting Metformin in the future to help with better control of hunger/cravings, declines meds today. Encouraged pt to increase exercise by walking at least 2 days per week. Reviewed benefits of meal prepping (Factor Meals, Long life, etc). Encouraged pt to avoid skipping lunch and work on  practicing mindful eating. Labs obtained today will be reviewed at her next visit.   Orders: -  Recheck A1C, insulin, TSH, and T4 free labs   Other depression, emotional eating behavior Assessment & Plan: Malene is aware she often has emotional cravings, especially for sugar. She states her mood is well controlled. Is  compliant with Wellbutrin and Celexa with no intolerances; has been on these for many years. Denies any SI/HI.   The importance of well controlled moods as it relates to overall health and weight loss was discussed with pt. Pt reminded of our bariatric psychologist, Dr. Dewaine Conger, who can serve as a resource for evaluation of significant struggles with emotional eating. Pt is agreeable and was referred to Dr. Dewaine Conger today.   Continue Wellbutrin at current dose, pt declines need for med adjustment at this time   Vitamin D deficiency Assessment & Plan: Lab Results  Component Value Date   VD25OH 45.2 10/06/2022   VD25OH 67.5 04/20/2018   VD25OH 59 08/12/2016   Last Vit D was below goal at 45.2 as of 09/2022. Pt is on Cholecalciferol 2000 once daily. Tolerating well, no SE. No acute concerns in this regard today.   Reviewed ideal vitamin D range of 50-70 with pt. Continue with current supplementation regimen as prescribed. No dose changes made today. Will continue to monitor condition. Labs obtained will be reviewed at her next visit.   Orders: - Recheck Vitamin D labs   Other hyperlipidemia Assessment & Plan: Lab Results  Component Value Date   CHOL 222 (H) 10/06/2022   HDL 63 10/06/2022   LDLCALC 136 (H) 10/06/2022   TRIG 131 10/06/2022   CHOLHDL 3.5 09/24/2021   LDL was above goal at 136 in 09/2022. She is not currently on any meds. Diet/exercise approach.   Work on following a heart healthy diet via her nutritional meal plan. Avoid high carb foods or snacks and decrease simple carbs. Pt was encouraged to increase exercise by walking 2-3 days a week around her work campus at [employer]. Will continue to monitor condition.   Orders: - Recheck CMP and lipid panel   B12 deficiency due to diet Assessment & Plan: Lab Results  Component Value Date   VITAMINB12 343 10/06/2022   B12 is below goal. She is not currently taking any B12 supplements. Reviewed ideal B12 goal of 500 or more with  pt. Will recheck B12 today. Labs obtained will be reviewed at her next visit.   Orders: - Recheck B12 and CBC   Follow up:   Return in about 2 weeks (around 11/15/2023). She was informed of the importance of frequent follow up visits to maximize her success with intensive lifestyle modifications for her multiple health conditions.     Subjective:   Chief complaint: Obesity Myka is here to discuss her progress with her obesity treatment plan. She is on the Category 1 Plan and keeping a food journal and adhering to recommended goals of 1150-1250 calories and 80+ g of  protein and states she is following her eating plan approximately 50% of the time. She states she is not exercising.  Interval History:  SATIA WINGER is here for a follow up office visit. Since last OV on 09/30/23 she is down 2 lbs. Is keeping healthier snacks in her drawer at work. Hunger is well controlled when she prioritizes her protein intake at dinner. She enjoys a Designer, industrial/product when snacking and notes most high protein snacks such as the [employer] chips have a "layer" on the outside that she dislikes. Has sometimes been skipping lunch.   Pharmacotherapy for weight loss: She  is currently taking Bupropion (single agent, off label use) with adequate clinical response  and without side effects..   Review of Systems:  Pertinent positives were addressed with patient today.  Reviewed by clinician on day of visit: allergies, medications, problem list, medical history, surgical history, family history, social history, and previous encounter notes.    Weight Summary and Biometrics   Weight Lost Since Last Visit: 2lb  Weight Gained Since Last Visit: 0    Vitals Temp: (!) 97.5 F (36.4 C) BP: 129/83 Pulse Rate: 60 SpO2: 100 %   Anthropometric Measurements Height: 5\' 4"  (1.626 m) Weight: 188 lb (85.3 kg) BMI (Calculated): 32.25 Weight at Last Visit: 190lb Weight Lost Since Last Visit: 2lb Weight Gained Since Last  Visit: 0 Starting Weight: 190lb Total Weight Loss (lbs): 2 lb (0.907 kg) Peak Weight: 190lb   Body Composition  Body Fat %: 41 % Fat Mass (lbs): 77.4 lbs Muscle Mass (lbs): 105.6 lbs Total Body Water (lbs): 75 lbs Visceral Fat Rating : 11   Other Clinical Data Fasting: yes Labs: yes Today's Visit #: 17 Starting Date: 10/06/22    Objective:   PHYSICAL EXAM: Blood pressure 129/83, pulse 60, temperature (!) 97.5 F (36.4 C), height 5\' 4"  (1.626 m), weight 188 lb (85.3 kg), last menstrual period 12/18/2015, SpO2 100%. Body mass index is 32.27 kg/m.  General: she is overweight, cooperative and in no acute distress. PSYCH: Has normal mood, affect and thought process.   HEENT: EOMI, sclerae are anicteric. Lungs: Normal breathing effort, no conversational dyspnea. Extremities: Moves * 4 Neurologic: A and O * 3, good insight  DIAGNOSTIC DATA REVIEWED: BMET    Component Value Date/Time   NA 141 10/06/2022 0927   K 4.6 10/06/2022 0927   CL 100 10/06/2022 0927   CO2 26 10/06/2022 0927   GLUCOSE 81 10/06/2022 0927   GLUCOSE 94 07/21/2022 1112   BUN 9 10/06/2022 0927   CREATININE 1.00 10/06/2022 0927   CREATININE 1.08 (H) 08/30/2017 0838   CALCIUM 9.9 10/06/2022 0927   GFRNONAA >60 07/21/2022 1112   GFRAA 59 (L) 09/11/2020 0923   Lab Results  Component Value Date   HGBA1C 5.4 10/06/2022   Lab Results  Component Value Date   INSULIN 7.1 10/06/2022   Lab Results  Component Value Date   TSH 1.300 10/06/2022   CBC    Component Value Date/Time   WBC 7.2 07/21/2022 1112   RBC 4.41 07/21/2022 1112   HGB 13.3 07/21/2022 1112   HGB 13.2 09/24/2021 0946   HCT 39.6 07/21/2022 1112   HCT 39.1 09/24/2021 0946   PLT 256 07/21/2022 1112   PLT 278 09/24/2021 0946   MCV 89.8 07/21/2022 1112   MCV 89 09/24/2021 0946   MCH 30.2 07/21/2022 1112   MCHC 33.6 07/21/2022 1112   RDW 12.8 07/21/2022 1112   RDW 12.3 09/24/2021 0946   Iron Studies No results found for:  "IRON", "TIBC", "FERRITIN", "IRONPCTSAT" Lipid Panel     Component Value Date/Time   CHOL 222 (H) 10/06/2022 0927   TRIG 131 10/06/2022 0927   HDL 63 10/06/2022 0927   CHOLHDL 3.5 09/24/2021 0946   CHOLHDL 3.2 08/30/2017 0838   VLDL 28 08/12/2016 0917   LDLCALC 136 (H) 10/06/2022 0927   LDLCALC 127 (H) 08/30/2017 0838   Hepatic Function Panel     Component Value Date/Time   PROT 7.2 10/06/2022 0927   ALBUMIN 4.6 10/06/2022 0927   AST 15 10/06/2022 0927   ALT  20 10/06/2022 0927   ALKPHOS 92 10/06/2022 0927   BILITOT 0.6 10/06/2022 0927   BILIDIR 0.1 08/01/2015 0001   IBILI 0.5 08/01/2015 0001      Component Value Date/Time   TSH 1.300 10/06/2022 0927   Nutritional Lab Results  Component Value Date   VD25OH 45.2 10/06/2022   VD25OH 67.5 04/20/2018   VD25OH 59 08/12/2016    Attestations:   I, Isabelle Course, acting as a medical scribe for Thomasene Lot, DO., have compiled all relevant documentation for today's office visit on behalf of Thomasene Lot, DO, while in the presence of [employer], DO.  Reviewed by clinician on day of visit: allergies, medications, problem list, medical history, surgical history, family history, social history, and previous encounter notes pertinent to patient's obesity diagnosis.  I have spent 40 minutes in the care of the patient today including: preparing to see patient (e.g. review and interpretation of tests, old notes ), obtaining and/or reviewing separately obtained history, performing a medically appropriate examination or evaluation, counseling and educating the patient, ordering medications, test or procedures, documenting clinical information in the electronic or other health care record, and independently interpreting results and communicating results to the patient, family, or caregiver   I have reviewed the above documentation for accuracy and completeness, and I agree with the above. [name], D.O.  The 21st  Century Cures Act was signed into law in 2016 which includes the topic of electronic health records.  This provides immediate access to information in MyChart.  This includes consultation notes, operative notes, office notes, lab results and pathology reports.  If you have any questions about what you read please let us know at your next visit so we can discuss your concerns and take corrective action if need be.  We are right here with you.

## 2023-11-01 NOTE — Progress Notes (Signed)
 Entered in error

## 2023-11-01 NOTE — Patient Instructions (Addendum)
  Stay well hydrated. Stop the ibuprofen and instead take meloxicam once daily with food. You need to wait 6 hours from the toradol shot given in the office before taking your first dose of meloxicam (take it with dinner). Take this daily until you are completely better (you may not need the whole #15 that are in the bottle). Take the muscle relaxant as needed (methocarbamol). I would stay away from the patches since you are having irritation from the adhesive. You can use heating pad, and/or topical medications such as Biofreeze, CBD oil, etc. You can use a 1% hydrocortisone cream to the area of rash if it is itchy or causing discomfort.  Do not take any ibuprofen/advil/motrin/naproxen/Aleve/Goody or BC powder while taking the meloxicam. You may use tylenol products as needed.  Try some of the stretches discussed, after using some heat.  Let us know if you develop a rash (different from what you have from the patch), extending to your abdomen, looking like bumps or blisters. This could indicate shingles, which would be treated differently. Let us know if you have any change to your urine (dark, bloody, pain, etc), as we would need to re-evaluate you, and recheck the urine (kidney stones usually cause microscopic blood in the urine, or visible if worse.  Your urine was completely normal today).

## 2023-11-02 ENCOUNTER — Other Ambulatory Visit: Payer: Self-pay | Admitting: *Deleted

## 2023-11-02 ENCOUNTER — Encounter: Payer: Self-pay | Admitting: Family Medicine

## 2023-11-02 DIAGNOSIS — R1012 Left upper quadrant pain: Secondary | ICD-10-CM

## 2023-11-02 DIAGNOSIS — M545 Low back pain, unspecified: Secondary | ICD-10-CM

## 2023-11-02 LAB — CBC WITH DIFFERENTIAL/PLATELET
Basophils Absolute: 0.1 10*3/uL (ref 0.0–0.2)
Basos: 1 %
EOS (ABSOLUTE): 0.4 10*3/uL (ref 0.0–0.4)
Eos: 6 %
Hematocrit: 41.8 % (ref 34.0–46.6)
Hemoglobin: 14 g/dL (ref 11.1–15.9)
Immature Grans (Abs): 0 10*3/uL (ref 0.0–0.1)
Immature Granulocytes: 0 %
Lymphocytes Absolute: 2 10*3/uL (ref 0.7–3.1)
Lymphs: 33 %
MCH: 29.9 pg (ref 26.6–33.0)
MCHC: 33.5 g/dL (ref 31.5–35.7)
MCV: 89 fL (ref 79–97)
Monocytes Absolute: 0.6 10*3/uL (ref 0.1–0.9)
Monocytes: 9 %
Neutrophils Absolute: 3.1 10*3/uL (ref 1.4–7.0)
Neutrophils: 51 %
Platelets: 238 10*3/uL (ref 150–450)
RBC: 4.68 x10E6/uL (ref 3.77–5.28)
RDW: 12.7 % (ref 11.7–15.4)
WBC: 6 10*3/uL (ref 3.4–10.8)

## 2023-11-02 LAB — COMPREHENSIVE METABOLIC PANEL
ALT: 12 [IU]/L (ref 0–32)
AST: 14 [IU]/L (ref 0–40)
Albumin: 4.6 g/dL (ref 3.8–4.9)
Alkaline Phosphatase: 67 [IU]/L (ref 44–121)
BUN/Creatinine Ratio: 17 (ref 9–23)
BUN: 19 mg/dL (ref 6–24)
Bilirubin Total: 0.6 mg/dL (ref 0.0–1.2)
CO2: 24 mmol/L (ref 20–29)
Calcium: 9.3 mg/dL (ref 8.7–10.2)
Chloride: 100 mmol/L (ref 96–106)
Creatinine, Ser: 1.12 mg/dL — ABNORMAL HIGH (ref 0.57–1.00)
Globulin, Total: 2.3 g/dL (ref 1.5–4.5)
Glucose: 95 mg/dL (ref 70–99)
Potassium: 4.7 mmol/L (ref 3.5–5.2)
Sodium: 137 mmol/L (ref 134–144)
Total Protein: 6.9 g/dL (ref 6.0–8.5)
eGFR: 57 mL/min/{1.73_m2} — ABNORMAL LOW (ref >59)

## 2023-11-02 LAB — TSH: TSH: 1.36 u[IU]/mL (ref 0.450–4.500)

## 2023-11-02 LAB — HEMOGLOBIN A1C
Est. average glucose Bld gHb Est-mCnc: 105 mg/dL
Hgb A1c MFr Bld: 5.3 % (ref 4.8–5.6)

## 2023-11-02 LAB — LIPID PANEL
Chol/HDL Ratio: 3.6 {ratio} (ref 0.0–4.4)
Cholesterol, Total: 230 mg/dL — ABNORMAL HIGH (ref 100–199)
HDL: 64 mg/dL (ref >39)
LDL Chol Calc (NIH): 147 mg/dL — ABNORMAL HIGH (ref 0–99)
Triglycerides: 106 mg/dL (ref 0–149)
VLDL Cholesterol Cal: 19 mg/dL (ref 5–40)

## 2023-11-02 LAB — INSULIN, RANDOM: INSULIN: 6.3 u[IU]/mL (ref 2.6–24.9)

## 2023-11-02 LAB — VITAMIN B12: Vitamin B-12: 304 pg/mL (ref 232–1245)

## 2023-11-02 LAB — T4, FREE: Free T4: 0.97 ng/dL (ref 0.82–1.77)

## 2023-11-02 LAB — VITAMIN D 25 HYDROXY (VIT D DEFICIENCY, FRACTURES): Vit D, 25-Hydroxy: 69 ng/mL (ref 30.0–100.0)

## 2023-11-02 NOTE — Telephone Encounter (Signed)
 Spoke with patient and there is no new rash. She will call back if something develops. Will order CT abd/pelvis.

## 2023-11-03 NOTE — Telephone Encounter (Signed)
 I'd like this to be done sooner, more urgently. Forwarding to [employer].  Can be a different location if needed, or can put in as a more urgent request. Thank you.  Veronica--Please be sure to follow-up on this

## 2023-11-04 ENCOUNTER — Ambulatory Visit (HOSPITAL_COMMUNITY)
Admission: RE | Admit: 2023-11-04 | Discharge: 2023-11-04 | Disposition: A | Discharge status: Home / Self Care | Payer: 59 | Source: Ambulatory Visit | Attending: Family Medicine | Admitting: Family Medicine

## 2023-11-04 DIAGNOSIS — R1012 Left upper quadrant pain: Secondary | ICD-10-CM | POA: Diagnosis present

## 2023-11-04 DIAGNOSIS — M545 Low back pain, unspecified: Secondary | ICD-10-CM | POA: Insufficient documentation

## 2023-11-04 MED ORDER — IOHEXOL 300 MG/ML  SOLN
100.0000 mL | Freq: Once | INTRAMUSCULAR | Status: AC | PRN
  Administered 2023-11-04: 100 mL via INTRAVENOUS

## 2023-11-08 ENCOUNTER — Other Ambulatory Visit: Payer: Self-pay | Admitting: Family Medicine

## 2023-11-08 ENCOUNTER — Ambulatory Visit
Admission: RE | Admit: 2023-11-08 | Discharge: 2023-11-08 | Disposition: A | Discharge status: Home / Self Care | Payer: 59 | Source: Ambulatory Visit | Attending: Family Medicine | Admitting: Family Medicine

## 2023-11-08 DIAGNOSIS — M546 Pain in thoracic spine: Secondary | ICD-10-CM

## 2023-11-08 DIAGNOSIS — R0781 Pleurodynia: Secondary | ICD-10-CM

## 2023-11-08 MED ORDER — PREDNISONE 10 MG (21) PO TBPK: ORAL_TABLET | ORAL | 0 refills | Status: DC

## 2023-11-09 ENCOUNTER — Encounter: Payer: Self-pay | Admitting: Family Medicine

## 2023-11-10 ENCOUNTER — Ambulatory Visit (HOSPITAL_COMMUNITY): Payer: Self-pay

## 2023-11-12 ENCOUNTER — Encounter: Payer: Self-pay | Admitting: Obstetrics and Gynecology

## 2023-11-15 ENCOUNTER — Other Ambulatory Visit: Payer: Self-pay | Admitting: Obstetrics and Gynecology

## 2023-11-15 MED ORDER — ESTRADIOL 0.05 MG/24HR TD PTTW: 1.0000 | MEDICATED_PATCH | TRANSDERMAL | 2 refills | Status: DC

## 2023-11-15 NOTE — Progress Notes (Signed)
 New dose of Vivelle Dot 0.05 mg twice weekly per patient request through My Chart.

## 2023-11-16 ENCOUNTER — Telehealth (INDEPENDENT_AMBULATORY_CARE_PROVIDER_SITE_OTHER): Payer: 59 | Admitting: Psychology

## 2023-11-16 DIAGNOSIS — F5089 Other specified eating disorder: Secondary | ICD-10-CM | POA: Diagnosis not present

## 2023-11-16 NOTE — Progress Notes (Signed)
 Office: [phone]  /  Fax: [phone]    Date: November 16, 2023    Appointment Start Time: 3:04pm Duration: 40 minutes Provider: Lawerance Cruel, Psy.D. Type of Session: Intake for Individual Therapy  Location of Patient: Home (private location) Location of Provider: Provider's home (private office) Type of Contact: Telepsychological Visit via MyChart Video Visit  Informed Consent: Prior to proceeding with today's appointment, two pieces of identifying information were obtained. In addition, Brandi Carpenter's physical location at the time of this appointment was obtained as well a phone number she could be reached at in the event of technical difficulties. Brandi Carpenter and this provider participated in today's telepsychological service.   The provider's role was explained to Brandi Carpenter. The provider reviewed and discussed issues of confidentiality, privacy, and limits therein (e.g., reporting obligations). In addition to verbal informed consent, written informed consent for psychological services was obtained prior to the initial appointment. Since the clinic is not a 24/7 crisis center, mental health emergency resources were shared and this  provider explained MyChart, e-mail, voicemail, and/or other messaging systems should be utilized only for non-emergency reasons. This provider also explained that information obtained during appointments will be placed in Brandi Carpenter's medical record and relevant information will be shared with other providers at [facility] at any locations for coordination of care. Brandi Carpenter agreed information may be shared with other [facility] providers as needed for coordination of care and by signing the service agreement document, she provided written consent for coordination of care. Prior to initiating telepsychological services, Brandi Carpenter completed an informed consent document, which included the development of a safety plan (i.e., an emergency  contact and emergency resources) in the event of an emergency/crisis. Brandi Carpenter verbally acknowledged understanding she is ultimately responsible for understanding her insurance benefits for telepsychological and in-person services. This provider also reviewed confidentiality, as it relates to telepsychological services. Brandi Carpenter  acknowledged understanding that appointments cannot be recorded without both party consent and she is aware she is responsible for securing confidentiality on her end of the session. Brandi Carpenter verbally consented to proceed.  Chief Complaint/HPI: Brandi Carpenter was referred by Dr. Thomasene Lot due to  "other depression, emotional eating behavior" . Per the note for the visit with Dr. Thomasene Lot on 11/01/2023, "Brandi Carpenter is aware she often has emotional cravings, especially for sugar. She states her mood is well controlled. Is compliant with Wellbutrin and Celexa with no intolerances; has been on these for many years. Denies any SI/HI."   During today's appointment, Brandi Carpenter was verbally administered a questionnaire assessing various behaviors related to emotional eating behaviors. Brandi Carpenter endorsed the following: overeat when you are celebrating, experience food cravings on a regular basis, eat certain foods when you are anxious, stressed, depressed, or your feelings are hurt, use food to help you cope with emotional situations, find food is comforting to you, overeat when you are worried about something, overeat frequently when you are bored or lonely, eat to help you stay awake, and eat as a reward. She shared she craves sweets (e.g., candies, cakes, pastries, chocolate milk) and crunchy textures. Jamaira believes the onset of emotional eating behaviors was likely in childhood and described the current frequency of emotional eating behaviors as "several times a week, at least." In addition, Brandi Carpenter denied a history of binge eating behaviors. Brandi Carpenter denied a history of significantly  restricting food intake, purging and engagement in other compensatory strategies for weight loss, and has never been diagnosed with an eating disorder. She also denied  a history of treatment for emotional eating behaviors. Brandi Carpenter indicated she started with the clinic approximately a year ago, adding "I haven't lost any weight." She described experiencing all or nothing thinking. She also expressed challenges with consistency and motivation. Furthermore, Brandi Carpenter denied other problems of concern.   Mental Status Examination:  Appearance: neat Behavior: appropriate to circumstances Mood: neutral Affect: mood congruent Speech: WNL Eye Contact: appropriate Psychomotor Activity: WNL Gait: unable to assess  Thought Process: linear, logical, and goal directed and denies suicidal, homicidal, and self-harm ideation, plan and intent  Thought Content/Perception: no hallucinations, delusions, bizarre thinking or behavior endorsed or observed Orientation: AAOx4 Memory/Concentration: intact Insight/Judgment: fair  Family & Psychosocial History: Brandi Carpenter reported she is in a relationship and she does not have any children. 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 Additionally, Brandi Carpenter shared her highest level of education obtained is a bachelor's degree. Currently, Brandi Carpenter's social support system consists of her partner, mother, and sister. Moreover, Brandi Carpenter stated she resides with her partner and dog.   Medical History:  Past Medical History:  Diagnosis Date   Abnormal Pap smear    ASCUS 08/1999; CIN 11/1999, s/p conization, 12-26-2018 AGUS   Allergic rhinitis, cause unspecified    Anxiety    Back pain    Basal cell carcinoma 06/12/2022   Complication of anesthesia    "difficulty awakening" remote past   DDD (degenerative disc disease), cervical 05/05/2018   Noted on imaging   DDD (degenerative disc disease), lumbar 06/08/2018   Noted on imaging   Depression     Endometrial polyp    Family history of adverse reaction to anesthesia    mother slow to awaken   GERD (gastroesophageal reflux disease)    Headache    Insomnia    PMB (postmenopausal bleeding) 05/08/2021   Post-nasal drip 05/08/2021   uses  mucinex daily for   Spondylolysis, 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 Noland Fordyce - [facility] - Moderate to severe dysplasia, negative margins   COLONOSCOPY  09/2015   COLPOSCOPY N/A 06/15/2022   Procedure: COLPOSCOPY;  Surgeon: Patton Salles, MD;  Location: [facility];  Service: Gynecology;  Laterality: N/A;   DILATATION & CURETTAGE/HYSTEROSCOPY WITH MYOSURE N/A 01/23/2019   Procedure: DILATATION & CURETTAGE/HYSTEROSCOPY WITH MYOSURE;  Surgeon: Patton Salles, MD;  Location: [facility];  Service: Gynecology;  Laterality: N/A;   DILATATION & CURETTAGE/HYSTEROSCOPY WITH MYOSURE N/A 05/13/2021   Procedure: DILATATION & CURETTAGE/HYSTEROSCOPY WITH MYOSURE RESECTION OF ENDOMETRIAL POLYP, ENDOCERVICAL CURETTAGE;  Surgeon: Patton Salles, MD;  Location: [facility];  Service: Gynecology;  Laterality: N/A;   DILATATION & CURETTAGE/HYSTEROSCOPY WITH MYOSURE N/A 06/15/2022   Procedure: DILATATION & CURETTAGE/HYSTEROSCOPY WITH MYOSURE;  Surgeon: Patton Salles, MD;  Location: [facility];  Service: Gynecology;  Laterality: N/A;   LEEP N/A 06/15/2022   Procedure: LOOP ELECTROSURGICAL  EXCISION PROCEDURE (LEEP);  Surgeon:  Patton Salles, MD;  Location: [facility];  Service: Gynecology;  Laterality: N/A;   LUMBAR DISC SURGERY  10/2018   LUMBAR MICRODISCECTOMY Right 11/09/2018   lumbar level:L5-S1 Utilizing microscope-Dr.Gary Cram   NASAL SEPTOPLASTY W/ TURBINOPLASTY Bilateral 10/07/2015   Procedure: NASAL SEPTOPLASTY WITH BILATERAL TURBINATE REDUCTION;  Surgeon: Newman Pies, MD;  Location: [facility];  Service: ENT;  Laterality: Bilateral;   PILONIDAL CYST EXCISION  1988   ROBOTIC ASSISTED TOTAL HYSTERECTOMY WITH BILATERAL SALPINGO OOPHERECTOMY Bilateral 07/30/2022   Procedure: XI ROBOTIC ASSISTED TOTAL HYSTERECTOMY WITH BILATERAL SALPINGO OOPHORECTOMY, POSSIBLE LAPAROTOMY;  Surgeon: Carver Fila, MD;  Location: [facility];  Service: Gynecology;  Laterality: Bilateral;   SENTINEL NODE BIOPSY N/A 07/30/2022   Procedure: SENTINEL NODE BIOPSY;  Surgeon: Carver Fila, MD;  Location: [facility];  Service: Gynecology;  Laterality: N/A;   WISDOM TOOTH EXTRACTION     yrs ago   Current Outpatient Medications on File Prior to Visit  Medication Sig Dispense Refill   acetaminophen (TYLENOL) 500 MG tablet Take 1,000 mg by mouth every 6 (six) hours as needed.     ALPRAZolam (XANAX) 0.5 MG tablet TAKE 1/2 TO 1 TABLET BY MOUTH THREE TIMES DAILY AS NEEDED FOR ANXIETY (Patient not taking: Reported on 11/01/2023) 20 tablet 0   buPROPion (WELLBUTRIN XL) 150 MG 24 hr tablet Take 150 mg by mouth daily.     Cholecalciferol (VITAMIN D) 2000 UNITS tablet Take 2,000 Units by mouth daily.     citalopram (CELEXA) 20 MG tablet Take 20 mg by mouth every evening.     diphenhydrAMINE (BENADRYL) 25 MG tablet Take 50 mg by mouth every 6 (six) hours as needed for allergies.     EPINEPHrine 0.3 mg/0.3 mL IJ SOAJ injection Inject 0.3 mg into the muscle as needed for anaphylaxis. (Patient not taking: Reported on 11/01/2023)     estradiol (VIVELLE-DOT) 0.05 MG/24HR patch  Place 1 patch (0.05 mg total) onto the skin 2 (two) times a week. 24 patch 2   famotidine (PEPCID) 20 MG tablet Take 20 mg by mouth daily.     fluticasone (FLONASE) 50 MCG/ACT nasal spray Place 2 sprays into both nostrils daily.  0   gabapentin (NEURONTIN) 400 MG capsule Take 400 mg by mouth every evening.     guaiFENesin (MUCINEX) 600 MG 12 hr tablet Take 1,200 mg by mouth 2 (two) times daily.     ibuprofen (ADVIL) 200 MG tablet Take 800 mg by mouth every 6 (six) hours as needed.     levocetirizine (XYZAL) 5 MG tablet TAKE 1 TABLET BY MOUTH EVERY EVENING 90 tablet 3   meloxicam (MOBIC) 15 MG tablet Take 1 tablet (15 mg total) by mouth daily. Take with food. Take until your pain is better 15 tablet 0   methocarbamol (ROBAXIN) 500 MG tablet Take 1-2 tablets (500-1,000 mg total) by mouth every 8 (eight) hours as needed for muscle spasms. 20 tablet 0   montelukast (SINGULAIR) 10 MG tablet Take 10 mg by mouth every evening.     predniSONE (STERAPRED UNI-PAK 21 TAB) 10 MG (21) TBPK tablet Take as directed, with food 21 each 0   Pseudoephedrine-DM-GG-APAP (SUDAFED COLD/COUGH PO) Take by mouth. (Patient not taking: Reported on 11/01/2023)     sodium chloride (OCEAN) 0.65 % SOLN nasal spray Place 1 spray into both nostrils daily.     zolpidem (AMBIEN) 10 MG tablet TAKE 1 TABLET(10 MG) BY MOUTH AT BEDTIME AS NEEDED FOR  SLEEP 30 tablet 0   No current facility-administered medications on file prior to visit.  Brandi Carpenter stated she is medication compliant.   Mental Health History: Brandi Carpenter reported she attended "some counseling a long time ago." She shared she is currently prescribed Wellbutrin and Celexa by her psychiatrist (virtually) and Xanax and Ambien by her PCP. Brandi Carpenter reported there is no history of hospitalizations for psychiatric concerns. Brandi Carpenter endorsed a family history of depression (sister) and anxiety (sister). Furthermore, Brandi Carpenter reported there is no history of trauma including  psychological, physical , and sexual abuse, as well as neglect.   Brandi Carpenter described her typical mood lately as "good." Brandi Carpenter denied current alcohol use. She denied tobacco use. She denied illicit/recreational substance use. Furthermore, Brandi Carpenter indicated she is not experiencing the following: hallucinations and delusions, paranoia, symptoms of mania , social withdrawal, crying spells, panic attacks, memory concerns, attention and concentration issues, and obsessions and compulsions. She also denied history of and current suicidal ideation, plan, and intent; history of and current homicidal ideation, plan, and intent; and history of and current engagement in self-harm.  Legal History: Analyssa reported there is no history of legal involvement.   Structured Assessments Results: The Patient Health Questionnaire-9 (PHQ-9) is a self-report measure that assesses symptoms and severity of depression over the course of the last two weeks. Shalyn obtained a score of 0. [0= Not at all; 1= Several days; 2= More than half the days; 3= Nearly every day] Little interest or pleasure in doing things 0  Feeling down, depressed, or hopeless 0  Trouble falling or staying asleep, or sleeping too much 0  Feeling tired or having little energy 0  Poor appetite or overeating 0  Feeling bad about yourself --- or that you are a failure or have let yourself or your family down 0  Trouble concentrating on things, such as reading the newspaper or watching television 0  Moving or speaking so slowly that other people could have noticed? Or the opposite --- being so fidgety or restless that you have been moving around a lot more than usual 0  Thoughts that you would be better off dead or hurting yourself in some way 0  PHQ-9 Score 0    The Generalized Anxiety Disorder-7 (GAD-7) is a brief self-report measure that assesses symptoms of anxiety over the course of the last two weeks. Orchid obtained a score of 1 suggesting  minimal anxiety. Ruthann finds the endorsed symptoms to be not difficult at all. [0= Not at all; 1= Several days; 2= Over half the days; 3= Nearly every day] Feeling nervous, anxious, on edge 0  Not being able to stop or control worrying 0  Worrying too much about different things 1  Trouble relaxing 0  Being so restless that it's hard to sit still 0  Becoming easily annoyed or irritable 0  Feeling afraid as if something awful might happen 0  GAD-7 Score 1   Interventions:  Conducted a chart review Focused on rapport building Verbally administered PHQ-9 and GAD-7 for symptom monitoring Verbally administered Food & Mood questionnaire to assess various behaviors related to emotional eating Provided emphatic reflections and validation Psychoeducation provided regarding physical versus emotional hunger  Diagnostic Impressions & Provisional DSM-5 Diagnosis(es): Genevieve reported a history of engaging in emotional eating behaviors starting in childhood and described the current frequency of emotional eating behaviors as "several times a week, at least." She denied engagement in any other disordered eating behaviors. Based on the aforementioned, the following diagnosis was  assigned: F50.89 Other Specified Feeding or Eating Disorder, Emotional Eating Behaviors.  Plan: Nilani appears able and willing to participate as evidenced by engagement in reciprocal conversation and asking questions as needed for clarification. The next appointment is scheduled for 12/07/2023 at 2:30pm, which will be via MyChart Video Visit. The following treatment goal was established: increase coping skills. This provider will regularly review the treatment plan and medical chart to keep informed of status changes. Meegan expressed understanding and agreement with the initial treatment plan of care. Oakley will be sent a handout via e-mail to utilize between now and the next appointment to increase awareness of hunger  patterns and subsequent eating. Kyleena provided verbal consent during today's appointment for this provider to send the handout via e-mail.    Lawerance Cruel, PsyD

## 2023-11-29 ENCOUNTER — Encounter (INDEPENDENT_AMBULATORY_CARE_PROVIDER_SITE_OTHER): Payer: Self-pay | Admitting: Family Medicine

## 2023-11-29 ENCOUNTER — Ambulatory Visit (INDEPENDENT_AMBULATORY_CARE_PROVIDER_SITE_OTHER): Payer: 59 | Admitting: Family Medicine

## 2023-11-29 ENCOUNTER — Other Ambulatory Visit (INDEPENDENT_AMBULATORY_CARE_PROVIDER_SITE_OTHER): Payer: Self-pay | Admitting: Family Medicine

## 2023-11-29 VITALS — BP 110/73 | HR 58 | Temp 97.3°F | Ht 64.0 in | Wt 189.0 lb

## 2023-11-29 DIAGNOSIS — E88819 Insulin resistance, unspecified: Secondary | ICD-10-CM

## 2023-11-29 DIAGNOSIS — F3289 Other specified depressive episodes: Secondary | ICD-10-CM

## 2023-11-29 DIAGNOSIS — F5089 Other specified eating disorder: Secondary | ICD-10-CM

## 2023-11-29 DIAGNOSIS — Z6832 Body mass index (BMI) 32.0-32.9, adult: Secondary | ICD-10-CM

## 2023-11-29 DIAGNOSIS — E669 Obesity, unspecified: Secondary | ICD-10-CM

## 2023-11-29 DIAGNOSIS — E538 Deficiency of other specified B group vitamins: Secondary | ICD-10-CM | POA: Diagnosis not present

## 2023-11-29 DIAGNOSIS — E559 Vitamin D deficiency, unspecified: Secondary | ICD-10-CM

## 2023-11-29 MED ORDER — CYANOCOBALAMIN 500 MCG PO TABS: 500.0000 ug | ORAL_TABLET | Freq: Every day | ORAL | Status: AC

## 2023-11-29 MED ORDER — METFORMIN HCL 500 MG PO TABS: ORAL_TABLET | ORAL | 0 refills | Status: DC

## 2023-11-29 MED ORDER — VITAMIN D 50 MCG (2000 UT) PO TABS: 2000.0000 [IU] | ORAL_TABLET | Freq: Every day | ORAL | Status: AC

## 2023-11-29 NOTE — Patient Instructions (Signed)
 The 10-year ASCVD risk score (Arnett DK, et al., 2019) is: 2%   Values used to calculate the score:     Age: 58 years     Sex: Female     Is Non-Hispanic African American: No     Diabetic: No     Tobacco smoker: No     Systolic Blood Pressure: 110 mmHg     Is BP treated: No     HDL Cholesterol: 64 mg/dL     Total Cholesterol: 230 mg/dL

## 2023-11-29 NOTE — Progress Notes (Signed)
 [name], D.O.  ABFM, ABOM Specializing in Clinical Bariatric Medicine  Office located at: [address] [place], [place]  [address]   Assessment and Plan:   FOR THE DISEASE OF OBESITY: BMI 32.0-32.9,adult  - Current BMI 32.43 Obesity- Start BMI 33.7 Assessment & Plan: Since last office visit on 11/01/2023, patient's muscle mass has decreased by 1.8 lbs. Fat mass has increased by 2.2 lbs. Total body water has increased by 0.8 lbs.  Counseling done on how various foods will affect these numbers and how to maximize success  Total lbs lost to date: 1 lb Total weight loss percentage to date: 0.53%    Recommended Dietary Goals Brandi Carpenter is currently in the action stage of change. As such, her goal is to continue weight management plan.  She has agreed to: continue current plan   Behavioral Intervention We discussed the following today: increasing lean protein intake to established goals, avoiding skipping meals, work on meal planning and preparation, continue to practice mindfulness when eating, and better snacking choices  Additional resources provided today: Handout Guide to GLP-1 Therapy (Metformin)  Evidence-based interventions for health behavior change were utilized today including the discussion of self monitoring techniques, problem-solving barriers and SMART goal setting techniques.   Regarding patient's less desirable eating habits and patterns, we employed the technique of small changes.   Pt will specifically work on: Increase purposeful physical activity for next visit.    Recommended Physical Activity Goals Brandi Carpenter has been advised to work up to 150 minutes of moderate intensity aerobic activity a week and strengthening exercises 2-3 times per week for cardiovascular health, weight loss maintenance and preservation of muscle mass.   She has agreed to :  Think about enjoyable ways to increase daily physical activity and overcoming barriers to exercise  and Increase physical activity in their day and reduce sedentary time (increase NEAT).   Pharmacotherapy We both agreed to: Start Metformin 0.5 tablet 500 mcg once daily   FOR ASSOCIATED CONDITIONS ADDRESSED TODAY:  Insulin resistance Assessment & Plan: Lab Results  Component Value Date   HGBA1C 5.3 11/01/2023   HGBA1C 5.4 10/06/2022   INSULIN 6.3 11/01/2023   INSULIN 7.1 10/06/2022   Lab Results  Component Value Date   TSH 1.360 11/01/2023   FREET4 0.97 11/01/2023   Lab Results  Component Value Date   WBC 6.0 11/01/2023   HGB 14.0 11/01/2023   HCT 41.8 11/01/2023   MCV 89 11/01/2023   MCH 29.9 11/01/2023   RDW 12.7 11/01/2023   PLT 238 11/01/2023   Lab Results  Component Value Date   CREATININE 1.12 (H) 11/01/2023   BUN 19 11/01/2023   NA 137 11/01/2023   K 4.7 11/01/2023   CL 100 11/01/2023   CO2 24 11/01/2023      Component Value Date/Time   PROT 6.9 11/01/2023 0906   ALBUMIN 4.6 11/01/2023 0906   AST 14 11/01/2023 0906   ALT 12 11/01/2023 0906   ALKPHOS 67 11/01/2023 0906   BILITOT 0.6 11/01/2023 0906   BILIDIR 0.1 08/01/2015 0001   IBILI 0.5 08/01/2015 0001  Pt is not currently on any medications. Diet/exercise controlled. Per last obtained labs, pt's A1c improved from 5.4 to 5.2, still out of PreDM range. Insulin levels improved from 7.1 to 6.3 but still slightly suboptimal -- goal <5. Creatinine levels slightly elevated at 1.12. Pt endorses occasionally taking advil for her back. eGFR is below goal at 57. Thyroid, liver, and blood counts WNL.  Explained role of simple carbs and insulin levels on hunger and cravings. Start Metformin 0.5 tablet 500 mcg once daily with lunch for 6 days, if tolerating well increase to 0.5 tablet with lunch and with dinner d/t lack of control of cravings throughout the day. Extensively explained risks/benefits on new medication. Educated pt that eating off plan while taking medication can increase risk of GI upset. Answered  all questions thoroughly. Informed pt that excessive use of NSAIDS (such as, Advil) can damage kidneys. Encouraged her to use alternatives, such as Tylenol. Continue following RCNP, and increasing intentional physical activity. Will continue to monitor levels closely for continued improvement.   Relevant Orders: -     metFORMIN HCl; 1/2 po with lunch and dinner  Dispense: 30 tablet; Refill: 0   Other depression, emotional eating behavior Assessment & Plan: Relevant medication: Wellbutrin XL 150 mg once daily and Celexa 20 mg once daily Pt is tolerating medication well, no adverse SE. She denies any SI/HI. Brandi Carpenter met with our bariatric psychologist, Dr. Dewaine Conger, about a week ago. Continue current medication and counseling regimen. Will continue to monitor to observe improvement.    Vitamin D deficiency Assessment & Plan: Lab Results  Component Value Date   VD25OH 69.0 11/01/2023  Brandi Carpenter is taking Cholecalciferol 2000 units once daily. Per last obtained labs, Vit D levels are WNL. Pt is tolerating supplement well. Continue current regimen. Will continue to monitor.   Relevant Orders: -     Vitamin D; Take 1 tablet (2,000 Units total) by mouth daily.    Other hyperlipidemia Assessment & Plan: Lab Results  Component Value Date   CHOL 230 (H) 11/01/2023   HDL 64 11/01/2023   LDLCALC 147 (H) 11/01/2023   TRIG 106 11/01/2023   CHOLHDL 3.6 11/01/2023  Pt is not currently on any HLD medications. Per last obtained labs, LDL was elevated at 147. We extensively discussed several lifestyle modifications today and she will continue to work on diet, exercise and weight loss efforts. I stressed the importance that patient continue with our prudent nutritional plan that is low in saturated and trans fats, and low in fatty carbs to improve these numbers. We will continue routine screening as patient continues to achieve health goals along their weight loss journey.   B12 deficiency due to  diet Assessment & Plan: Lab Results  Component Value Date   VITAMINB12 304 11/01/2023  This is a new diagnosis for Brandi Carpenter. Lab results obtained LOV show low B12 levels at 304. This diagnosis was reviewed with the patient and education was provided on SE of low B12 levels. Start cyanocobalamin 500 mcg once daily. All questions and concerns addressed about new supplementation. Continue their prudent nutritional plan and focus on b12 rich foods such as lean red meats; poultry; eggs; seafood; beans, peas, and lentils; nuts and seeds; and soy products. Will recheck in 3-4 months  Relevant Orders: -     Cyanocobalamin; Take 1 tablet (500 mcg total) by mouth daily.  Follow up:   Return in about 30 days (around 12/29/2023). She was informed of the importance of frequent follow up visits to maximize her success with intensive lifestyle modifications for her multiple health conditions.  Subjective:   Chief complaint: Obesity Brandi Carpenter is here to discuss her progress with her obesity treatment plan. She is on the the Category 1 Plan and states she is following her eating plan approximately 10% of the time. She states she is not exercising.  Interval History:  Brandi Dierolf  Carpenter is here for a follow up office visit. Since last OV on 11/01/2023, Nyla is down 4 lbs. She has been journaling her meal intake about 10%. Of what is journaled, pt has been including meals that adhere to RCNP. Her average protein intake is 50g, and average calorie intake is 1500. Pt reports feeling lack of motivation for weight loss journey d/t not seeing much results.   Pharmacotherapy for weight loss: She is currently taking no anti-obesity medication.   Review of Systems:  Pertinent positives were addressed with patient today.  Reviewed by clinician on day of visit: allergies, medications, problem list, medical history, surgical history, family history, social history, and previous encounter notes.  Weight Summary and  Biometrics   Weight Lost Since Last Visit: 0  Weight Gained Since Last Visit: 1lb   Vitals Temp: (!) 97.3 F (36.3 C) BP: 110/73 Pulse Rate: (!) 58 SpO2: 99 %   Anthropometric Measurements Height: 5\' 4"  (1.626 m) Weight: 189 lb (85.7 kg) BMI (Calculated): 32.43 Weight at Last Visit: 188lb Weight Lost Since Last Visit: 0 Weight Gained Since Last Visit: 1lb Starting Weight: 190lb Total Weight Loss (lbs): 1 lb (0.454 kg) Peak Weight: 190lb   Body Composition  Body Fat %: 42.1 % Fat Mass (lbs): 79.6 lbs Muscle Mass (lbs): 103.8 lbs Total Body Water (lbs): 75.8 lbs Visceral Fat Rating : 11   Other Clinical Data Labs: no Today's Visit #: 18 Starting Date: 10/06/22    Objective:   PHYSICAL EXAM: Blood pressure 110/73, pulse (!) 58, temperature (!) 97.3 F (36.3 C), height 5\' 4"  (1.626 m), weight 189 lb (85.7 kg), last menstrual period 12/18/2015, SpO2 99%. Body mass index is 32.44 kg/m.  General: she is overweight, cooperative and in no acute distress. PSYCH: Has normal mood, affect and thought process.   HEENT: EOMI, sclerae are anicteric. Lungs: Normal breathing effort, no conversational dyspnea. Extremities: Moves * 4 Neurologic: A and O * 3, good insight  DIAGNOSTIC DATA REVIEWED: BMET    Component Value Date/Time   NA 137 11/01/2023 0906   K 4.7 11/01/2023 0906   CL 100 11/01/2023 0906   CO2 24 11/01/2023 0906   GLUCOSE 95 11/01/2023 0906   GLUCOSE 94 07/21/2022 1112   BUN 19 11/01/2023 0906   CREATININE 1.12 (H) 11/01/2023 0906   CREATININE 1.08 (H) 08/30/2017 0838   CALCIUM 9.3 11/01/2023 0906   GFRNONAA >60 07/21/2022 1112   GFRAA 59 (L) 09/11/2020 0923   Lab Results  Component Value Date   HGBA1C 5.3 11/01/2023   HGBA1C 5.4 10/06/2022   Lab Results  Component Value Date   INSULIN 6.3 11/01/2023   INSULIN 7.1 10/06/2022   Lab Results  Component Value Date   TSH 1.360 11/01/2023   CBC    Component Value Date/Time   WBC 6.0  11/01/2023 0906   WBC 7.2 07/21/2022 1112   RBC 4.68 11/01/2023 0906   RBC 4.41 07/21/2022 1112   HGB 14.0 11/01/2023 0906   HCT 41.8 11/01/2023 0906   PLT 238 11/01/2023 0906   MCV 89 11/01/2023 0906   MCH 29.9 11/01/2023 0906   MCH 30.2 07/21/2022 1112   MCHC 33.5 11/01/2023 0906   MCHC 33.6 07/21/2022 1112   RDW 12.7 11/01/2023 0906   Iron Studies No results found for: "IRON", "TIBC", "FERRITIN", "IRONPCTSAT" Lipid Panel     Component Value Date/Time   CHOL 230 (H) 11/01/2023 0906   TRIG 106 11/01/2023 0906   HDL 64 11/01/2023 0906  CHOLHDL 3.6 11/01/2023 0906   CHOLHDL 3.2 08/30/2017 0838   VLDL 28 08/12/2016 0917   LDLCALC 147 (H) 11/01/2023 0906   LDLCALC 127 (H) 08/30/2017 0838   Hepatic Function Panel     Component Value Date/Time   PROT 6.9 11/01/2023 0906   ALBUMIN 4.6 11/01/2023 0906   AST 14 11/01/2023 0906   ALT 12 11/01/2023 0906   ALKPHOS 67 11/01/2023 0906   BILITOT 0.6 11/01/2023 0906   BILIDIR 0.1 08/01/2015 0001   IBILI 0.5 08/01/2015 0001      Component Value Date/Time   TSH 1.360 11/01/2023 0906   Nutritional Lab Results  Component Value Date   VD25OH 69.0 11/01/2023   VD25OH 45.2 10/06/2022   VD25OH 67.5 04/20/2018    Attestations:   I, Camryn Mix, acting as a Stage manager for [employer], DO., have compiled all relevant documentation for today's office visit on behalf of Thomasene Lot, DO, while in the presence of [employer], DO.  Reviewed by clinician on day of visit: allergies, medications, problem list, medical history, surgical history, family history, social history, and previous encounter notes pertinent to patient's obesity diagnosis. I have spent 48 minutes in the care of the patient today including: preparing to see patient (e.g. review and interpretation of tests, old notes ), obtaining and/or reviewing separately obtained history, performing a medically appropriate examination or evaluation, counseling and  educating the patient, ordering medications, test or procedures, documenting clinical information in the electronic or other health care record, and independently interpreting results and communicating results to the patient, family, or caregiver   I have reviewed the above documentation for accuracy and completeness, and I agree with the above. [name], D.O.  The 21st Century Cures Act was signed into law in 2016 which includes the topic of electronic health records.  This provides immediate access to information in MyChart.  This includes consultation notes, operative notes, office notes, lab results and pathology reports.  If you have any questions about what you read please let us know at your next visit so we can discuss your concerns and take corrective action if need be.  We are right here with you.

## 2023-12-06 ENCOUNTER — Other Ambulatory Visit: Payer: Self-pay | Admitting: Family Medicine

## 2023-12-06 DIAGNOSIS — G4700 Insomnia, unspecified: Secondary | ICD-10-CM

## 2023-12-07 ENCOUNTER — Telehealth (INDEPENDENT_AMBULATORY_CARE_PROVIDER_SITE_OTHER): Admitting: Psychology

## 2023-12-07 DIAGNOSIS — F5089 Other specified eating disorder: Secondary | ICD-10-CM | POA: Diagnosis not present

## 2023-12-07 NOTE — Telephone Encounter (Signed)
 Is this okay to refill?

## 2023-12-07 NOTE — Progress Notes (Signed)
  Office: [phone]  /  Fax: [phone]    Date: December 07, 2023  Appointment Start Time: 2:32pm Duration: 25 minutes Provider: Lawerance Cruel, Psy.D. Type of Session: Individual Therapy  Location of Patient: Work (private location) Location of Provider: Provider's Home (private office) Type of Contact: Telepsychological Visit via MyChart Video Visit  Session Content: Jeanenne is a 58 y.o. female presenting for a follow-up appointment to address the previously established treatment goal of increasing coping skills.Today's appointment was a telepsychological visit. Octa provided verbal consent for today's telepsychological appointment and she is aware she is responsible for securing confidentiality on her end of the session. Prior to proceeding with today's appointment, Nylee's physical location at the time of this appointment was obtained as well a phone number she could be reached at in the event of technical difficulties. Jentry and this provider participated in today's telepsychological service.   This provider conducted a brief check-in. 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 Briefly reviewed emotional and physical hunger. Psychoeducation regarding triggers for emotional eating was provided. Rosalia was provided a handout, and encouraged to utilize the handout between now and the next appointment to increase awareness of triggers and frequency. Kelsi agreed. This provider also discussed behavioral strategies for specific triggers, such as placing the utensil down when conversing to avoid mindless eating. Kainat provided verbal consent during today's appointment for this provider to send a handout about triggers via e-mail. Overall, Lacresia was receptive to today's appointment as evidenced by openness to sharing, responsiveness to feedback, and willingness to explore triggers for emotional eating.  Mental Status Examination:  Appearance: neat Behavior: appropriate  to circumstances Mood: neutral Affect: mood congruent Speech: WNL Eye Contact: appropriate Psychomotor Activity: WNL Gait: unable to assess Thought Process: linear, logical, and goal directed and no evidence or endorsement of suicidal, homicidal, and self-harm ideation, plan and intent  Thought Content/Perception: no hallucinations, delusions, bizarre thinking or behavior endorsed or observed Orientation: AAOx4 Memory/Concentration: intact Insight: fair Judgment: fair  Interventions:  Conducted a brief chart review Provided empathic reflections and validation Reviewed content from the previous session Provided positive reinforcement Employed supportive psychotherapy interventions to facilitate reduced distress and to improve coping skills with identified stressors Psychoeducation provided regarding triggers for emotional eating behaviors  DSM-5 Diagnosis(es): F50.89 Other Specified Feeding or Eating Disorder, Emotional Eating Behaviors  Treatment Goal & Progress: During the initial appointment with this provider, the following treatment goal was established: increase coping skills. Progress is limited, as Ariyan has just begun treatment with this provider; however, she is receptive to the interaction and interventions and rapport is being established.   Plan: The next appointment is scheduled for 01/04/2024 at 2:30pm, which will be via MyChart Video Visit. The next session will focus on working towards the established treatment goal.   Lawerance Cruel, PsyD

## 2023-12-29 ENCOUNTER — Ambulatory Visit (INDEPENDENT_AMBULATORY_CARE_PROVIDER_SITE_OTHER): Admitting: Family Medicine

## 2024-01-04 ENCOUNTER — Telehealth (INDEPENDENT_AMBULATORY_CARE_PROVIDER_SITE_OTHER): Admitting: Psychology

## 2024-01-05 ENCOUNTER — Other Ambulatory Visit (INDEPENDENT_AMBULATORY_CARE_PROVIDER_SITE_OTHER): Payer: Self-pay | Admitting: Family Medicine

## 2024-01-05 DIAGNOSIS — E88819 Insulin resistance, unspecified: Secondary | ICD-10-CM

## 2024-01-17 ENCOUNTER — Other Ambulatory Visit (INDEPENDENT_AMBULATORY_CARE_PROVIDER_SITE_OTHER): Payer: Self-pay | Admitting: Family Medicine

## 2024-01-17 DIAGNOSIS — E88819 Insulin resistance, unspecified: Secondary | ICD-10-CM

## 2024-01-18 MED ORDER — METFORMIN HCL 500 MG PO TABS: ORAL_TABLET | ORAL | 0 refills | Status: DC

## 2024-02-20 ENCOUNTER — Other Ambulatory Visit (INDEPENDENT_AMBULATORY_CARE_PROVIDER_SITE_OTHER): Payer: Self-pay | Admitting: Family Medicine

## 2024-02-20 DIAGNOSIS — E88819 Insulin resistance, unspecified: Secondary | ICD-10-CM

## 2024-02-21 ENCOUNTER — Other Ambulatory Visit: Payer: Self-pay | Admitting: Family Medicine

## 2024-02-21 DIAGNOSIS — G4700 Insomnia, unspecified: Secondary | ICD-10-CM

## 2024-02-22 NOTE — Telephone Encounter (Signed)
 Is this okay to refill?

## 2024-02-24 ENCOUNTER — Ambulatory Visit (INDEPENDENT_AMBULATORY_CARE_PROVIDER_SITE_OTHER): Admitting: Family Medicine

## 2024-03-20 ENCOUNTER — Ambulatory Visit (INDEPENDENT_AMBULATORY_CARE_PROVIDER_SITE_OTHER): Admitting: Family Medicine

## 2024-03-20 ENCOUNTER — Encounter (INDEPENDENT_AMBULATORY_CARE_PROVIDER_SITE_OTHER): Payer: Self-pay

## 2024-03-22 ENCOUNTER — Ambulatory Visit: Payer: Self-pay

## 2024-03-22 ENCOUNTER — Encounter: Payer: Self-pay | Admitting: Family Medicine

## 2024-03-22 ENCOUNTER — Ambulatory Visit: Admitting: Family Medicine

## 2024-03-22 VITALS — BP 126/72 | HR 68 | Temp 98.6°F | Ht 64.0 in | Wt 197.4 lb

## 2024-03-22 DIAGNOSIS — J3489 Other specified disorders of nose and nasal sinuses: Secondary | ICD-10-CM

## 2024-03-22 DIAGNOSIS — J301 Allergic rhinitis due to pollen: Secondary | ICD-10-CM

## 2024-03-22 MED ORDER — PREDNISONE 10 MG (21) PO TBPK: ORAL_TABLET | ORAL | 0 refills | Status: AC

## 2024-03-22 NOTE — Patient Instructions (Signed)
 Stay well hydrated, as this keeps the mucus thinner and easier to drain. Continue the mucinex , antihistamine (xyzal ) and mucinex  twice daily. Try adding a 12 hour pseudoephedrine to see if this helps alleviate the sinus pain. Be sure to take it early in the  morning to avoid any insomnia. If these aren't helping, start the prednisone  pack and take with food as directed. If you develop fever or discolored mucus or discolored drainage after your sinus rinses, contact us  for an antibiotic.

## 2024-03-22 NOTE — Telephone Encounter (Signed)
 FYI Only or Action Required?: Action required by provider: medication refill request and update on patient condition.  Patient was last seen in primary care on 11/29/2023 by Brandi Sober, DO.  Called Nurse Triage reporting Sinusitis.  Symptoms began several weeks ago.  Interventions attempted: OTC medications: Tylenol  Motrin , Prescription medications: completed antibiotic, and Rest, hydration, or home remedies.  Symptoms are: unchanged.  Triage Disposition: See Physician Within 24 Hours  Patient/caregiver understands and will follow disposition?: No, wishes to speak with PCP  Additional info:  Finished abx about 10 days ago for sinus infections, her symptoms have not cleared. She has needed steroid in the past for sinusitis, she is requesting that Dr. Randol send prescription for oral steroid.  She has pain in her face and teeth, ear pain, & mild headache. Denies fever and all other symptoms. No appointments are available at practice location until next week. Please advise on request of steroid rx, please follow up with recommendations.   Copied from CRM [id number]. Topic: Clinical - Medical Advice >> Mar 22, 2024  9:00 AM Myrick T wrote: Reason for CRM: patient stated she has been experiencing sinus pressure in her face. She has taken a week of antibiotics and it is not working. Please f/u with patient Reason for Disposition  Earache  Protocols used: Sinus Pain or Congestion-A-AH

## 2024-03-22 NOTE — Progress Notes (Unsigned)
 Chief Complaint  Patient presents with   other    Both Ear pain/ fullness, congestion, teeth pain all started 3 weeks ago maybe longer- 2 months ago has not really gotten completely over it when these symptoms started   Several weeks ago she started with congestion. Her teeth won't stop hurting, x several weeks. Denies any significant changes to her symptoms, but they have been ongoing and a nuisance, so she presents today for evaluation. She thinks she needs steroids to get over this.  No fever, chills, discolored mucus or phlegm. Ear pain comes/goes in both ears. No hearing loss. Intermittent sore throat. Denies cough.  Takes xyzal  chronically, as well as 1200 mg of mucinex  twice daily, flonase  twice daily, and saline rinses twice daily (neti-pot). Sinus drainage is clear after the rinses.  Using benadryl at night recently, helps some. Taking phenylephrine  some, which helps a little  She had a cold a month or so ago, no other illnesses. No new exposures/allergy exposures.   PMH, PSH, SH reviewed  Outpatient Encounter Medications as of 03/22/2024  Medication Sig Note   acetaminophen  (TYLENOL ) 500 MG tablet Take 1,000 mg by mouth every 6 (six) hours as needed. 11/01/2023: 1000mg  this am   ALPRAZolam  (XANAX ) 0.5 MG tablet TAKE 1/2 TO 1 TABLET BY MOUTH THREE TIMES DAILY AS NEEDED FOR ANXIETY 11/01/2023: As needed   buPROPion  (WELLBUTRIN  XL) 150 MG 24 hr tablet Take 150 mg by mouth daily.    Cholecalciferol (VITAMIN D ) 50 MCG (2000 UT) tablet Take 1 tablet (2,000 Units total) by mouth daily.    citalopram  (CELEXA ) 20 MG tablet Take 20 mg by mouth every evening.    diphenhydrAMINE (BENADRYL) 25 MG tablet Take 50 mg by mouth every 6 (six) hours as needed for allergies. 11/01/2023: Took 2 last night   EPINEPHrine  0.3 mg/0.3 mL IJ SOAJ injection Inject 0.3 mg into the muscle as needed for anaphylaxis.    estradiol  (VIVELLE -DOT) 0.05 MG/24HR patch Place 1 patch (0.05 mg total) onto the skin 2  (two) times a week.    famotidine (PEPCID) 20 MG tablet Take 20 mg by mouth daily.    fluticasone  (FLONASE ) 50 MCG/ACT nasal spray Place 2 sprays into both nostrils daily.    gabapentin  (NEURONTIN ) 400 MG capsule Take 400 mg by mouth every evening.    guaiFENesin  (MUCINEX ) 600 MG 12 hr tablet Take 1,200 mg by mouth 2 (two) times daily.    ibuprofen  (ADVIL ) 200 MG tablet Take 800 mg by mouth every 6 (six) hours as needed. 11/01/2023: Last dose Thursday   levocetirizine (XYZAL ) 5 MG tablet TAKE 1 TABLET BY MOUTH EVERY EVENING    methocarbamol  (ROBAXIN ) 500 MG tablet Take 1-2 tablets (500-1,000 mg total) by mouth every 8 (eight) hours as needed for muscle spasms.    montelukast  (SINGULAIR ) 10 MG tablet Take 10 mg by mouth every evening.    Pseudoephedrine-DM-GG-APAP (SUDAFED COLD/COUGH PO) Take by mouth. 11/01/2023: As needed   sodium chloride  (OCEAN) 0.65 % SOLN nasal spray Place 1 spray into both nostrils daily.    zolpidem  (AMBIEN ) 10 MG tablet TAKE 1 TABLET(10 MG) BY MOUTH AT BEDTIME AS NEEDED FOR SLEEP    cyanocobalamin  (VITAMIN B12) 500 MCG tablet Take 1 tablet (500 mcg total) by mouth daily. (Patient not taking: Reported on 03/22/2024)    [DISCONTINUED] meloxicam  (MOBIC ) 15 MG tablet Take 1 tablet (15 mg total) by mouth daily. Take with food. Take until your pain is better (Patient not taking: Reported on 03/22/2024)    [  DISCONTINUED] metFORMIN  (GLUCOPHAGE ) 500 MG tablet 1/2 po with lunch and dinner    [DISCONTINUED] predniSONE  (STERAPRED UNI-PAK 21 TAB) 10 MG (21) TBPK tablet Take as directed, with food    No facility-administered encounter medications on file as of 03/22/2024.   Allergies  Allergen Reactions   Codeine Itching and Nausea And Vomiting   Pamelor  [Nortriptyline ] Tinitus    ROS: no f/c, dizziness, CP, SOB.  Sinus headaches and congestion, ear pain, per HPI No GI or GU complaints. Moods are good. No other concerns.    PHYSICAL EXAM:  BP 126/72   Pulse 68   Ht 5' 4 (1.626  m)   Wt 197 lb 6.4 oz (89.5 kg)   LMP 12/18/2015   SpO2 99%   BMI 33.88 kg/m   T 98.6  Pleasant, well-appearing female in no distress HEENT: conjunctiva and sclera are clear, EOMI.  TM's and EAC's normal. Nasal mucosa only mildly edematous, no erythema, clear-white  mucus noted on the left. OP clear Minimally tender over maxillary sinuses Neck: no lymphadenopathy or mass Heart: regular rate and rhythm, no murmur Lungs: clear bilaterally Psych: normal mood, affect, hygiene and grooming Neuro: alert and oriented. Cranial nerves grossly intact. Normal gait.     ASSESSMENT/PLAN:  Allergic rhinitis due to pollen, unspecified seasonality - continue current treatment measures. No e/o infection in history or exam. Steroid course--risks/SE reviewed - Plan: predniSONE  (STERAPRED UNI-PAK 21 TAB) 10 MG (21) TBPK tablet  Sinus pain - cont mucinex , sinus rinses. Encouraged switch to pseudoephedrine as a trial. If sinus pain not better, take the prednisone  course - Plan: predniSONE  (STERAPRED UNI-PAK 21 TAB) 10 MG (21) TBPK tablet   Stay well hydrated, as this keeps the mucus thinner and easier to drain. Continue the mucinex , antihistamine (xyzal ) and mucinex  twice daily. Try adding a 12 hour pseudoephedrine to see if this helps alleviate the sinus pain. Be sure to take it early in the  morning to avoid any insomnia. If these aren't helping, start the prednisone  pack and take with food as directed. If you develop fever or discolored mucus or discolored drainage after your sinus rinses, contact us  for an antibiotic.

## 2024-06-29 ENCOUNTER — Other Ambulatory Visit: Payer: Self-pay | Admitting: Family Medicine

## 2024-06-29 DIAGNOSIS — G4700 Insomnia, unspecified: Secondary | ICD-10-CM

## 2024-08-01 ENCOUNTER — Other Ambulatory Visit: Payer: Self-pay | Admitting: Obstetrics and Gynecology

## 2024-08-01 NOTE — Telephone Encounter (Signed)
 Med refill request: estradiol  (vivelle -dot) patch Last AEX: 08/18/23 BS Next AEX: not sheduled Last MMG (if hormonal med) 02/09/23 Refill authorized: Last Rx sent #[phone] with 3 refills on 11/15/23 BS. Please Advise?

## 2024-08-02 NOTE — Telephone Encounter (Signed)
 Med refill request:  Estradiol  (VIVELLE -DOT) 0.05 MG/24HR patch Start:  08/01/24 Disp:   8 patches  Refills:  0  Last AEX: 08/18/23 Next AEX: 08/21/24 Last MMG (if hormonal med):  N/A Refill authorized? Please Advise.

## 2024-08-07 ENCOUNTER — Other Ambulatory Visit: Payer: Self-pay | Admitting: Obstetrics and Gynecology

## 2024-08-07 ENCOUNTER — Ambulatory Visit
Admission: RE | Admit: 2024-08-07 | Discharge: 2024-08-07 | Disposition: A | Discharge status: Home / Self Care | Source: Ambulatory Visit | Attending: Obstetrics and Gynecology | Admitting: Obstetrics and Gynecology

## 2024-08-07 DIAGNOSIS — Z1231 Encounter for screening mammogram for malignant neoplasm of breast: Secondary | ICD-10-CM

## 2024-08-19 ENCOUNTER — Ambulatory Visit: Payer: Self-pay | Admitting: Obstetrics and Gynecology

## 2024-08-21 ENCOUNTER — Other Ambulatory Visit (HOSPITAL_COMMUNITY)
Admission: RE | Admit: 2024-08-21 | Discharge: 2024-08-21 | Disposition: A | Discharge status: Home / Self Care | Source: Ambulatory Visit | Attending: Obstetrics and Gynecology | Admitting: Obstetrics and Gynecology

## 2024-08-21 ENCOUNTER — Ambulatory Visit: Payer: BC Managed Care – PPO | Admitting: Obstetrics and Gynecology

## 2024-08-21 ENCOUNTER — Encounter: Payer: Self-pay | Admitting: Obstetrics and Gynecology

## 2024-08-21 VITALS — BP 124/82 | HR 63 | Ht 65.5 in | Wt 199.0 lb

## 2024-08-21 DIAGNOSIS — Z01419 Encounter for gynecological examination (general) (routine) without abnormal findings: Secondary | ICD-10-CM

## 2024-08-21 DIAGNOSIS — Z1272 Encounter for screening for malignant neoplasm of vagina: Secondary | ICD-10-CM

## 2024-08-21 DIAGNOSIS — Z1331 Encounter for screening for depression: Secondary | ICD-10-CM

## 2024-08-21 DIAGNOSIS — Z87410 Personal history of cervical dysplasia: Secondary | ICD-10-CM

## 2024-08-21 DIAGNOSIS — Z8542 Personal history of malignant neoplasm of other parts of uterus: Secondary | ICD-10-CM

## 2024-08-21 MED ORDER — ESTRADIOL 0.05 MG/24HR TD PTTW: 1.0000 | MEDICATED_PATCH | TRANSDERMAL | 3 refills | Status: AC

## 2024-08-21 NOTE — Patient Instructions (Signed)

## 2024-08-21 NOTE — Progress Notes (Signed)
 58 y.o. G0P0000 Domestic Partner Caucasian female here for annual exam.    Followed for estrogen therapy, history of cervical dysplasia, and endometrial cancer.  Wants to continue ERT.   No bleeding, discharge, or pain.  No bladder or bowel issues.   Has tendinitis in right arm from building a shed.    PCP: Randol Dawes, MD   Patient's last menstrual period was 12/18/2015.           Sexually active: No.  The current method of family planning is post menopausal status.    Menopausal hormone therapy:  Vivelle   Exercising: Yes.    Walking  Smoker:  no  OB History  Gravida Para Term Preterm AB Living  0 0 0 0 0 0  SAB IAB Ectopic Multiple Live Births  0 0 0 0      HEALTH MAINTENANCE: Last 2 paps:  08/18/23 neg, HR HPV neg, 02/11/22 AGUS, HR HPV neg,   02-05-21 AGUS:Neg HR  History of abnormal Pap or positive HPV:  yes Mammogram:   08/07/24 Breast Density Cat C, BIRADS Cat 1 neg  Colonoscopy:  09/27/15- due 2027 Bone Density:  n/a  Result  n/a   Immunization History  Administered Date(s) Administered   INFLUENZA, HIGH DOSE SEASONAL PF 07/18/2021   Influenza Split 07/14/2014, 04/25/2016   Influenza,inj,Quad PF,6+ Mos 06/28/2013, 08/01/2015, 06/16/2019, 05/22/2020, 06/17/2022   Influenza-Unspecified 07/06/2017, 06/28/2018, 07/23/2023   Janssen (J&J) SARS-COV-2 Vaccination 11/23/2019   Moderna Covid-19 Vaccine Bivalent Booster 45yrs & up 09/24/2021   Moderna Sars-Covid-2 Vaccination 08/09/2020   Td (Adult),unspecified 10/14/1982   Tdap 05/11/2012, 09/24/2021   Zoster Recombinant(Shingrix ) 08/25/2018, 10/26/2018   Zoster, Live 04/25/2016      reports that she has never smoked. She has never used smokeless tobacco. She reports that she does not drink alcohol and does not use drugs.  Past Medical History:  Diagnosis Date   Abnormal Pap smear    ASCUS 08/1999; CIN 11/1999, s/p conization, 12-26-2018 AGUS   Allergic rhinitis, cause unspecified    Anxiety    Back pain     Basal cell carcinoma 06/12/2022   Complication of anesthesia    difficulty awakening remote past   DDD (degenerative disc disease), cervical 05/05/2018   Noted on imaging   DDD (degenerative disc disease), lumbar 06/08/2018   Noted on imaging   Depression    Endometrial polyp    Family history of adverse reaction to anesthesia    mother slow to awaken   GERD (gastroesophageal reflux disease)    Headache    Insomnia    PMB (postmenopausal bleeding) 05/08/2021   Post-nasal drip 05/08/2021   uses  mucinex  daily for   Spondylolysis, 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 Vonzell - [facility] - Moderate to severe dysplasia, negative margins   COLONOSCOPY  09/2015   COLPOSCOPY N/A 06/15/2022   Procedure: COLPOSCOPY;  Surgeon: Cathlyn JAYSON Nikki Bobie FORBES, MD;  Location: [facility];  Service:  Gynecology;  Laterality: N/A;   DILATATION & CURETTAGE/HYSTEROSCOPY WITH MYOSURE N/A 01/23/2019   Procedure: DILATATION & CURETTAGE/HYSTEROSCOPY WITH MYOSURE;  Surgeon: Cathlyn JAYSON Nikki Bobie FORBES, MD;  Location: [facility];  Service: Gynecology;  Laterality: N/A;   DILATATION & CURETTAGE/HYSTEROSCOPY WITH MYOSURE N/A 05/13/2021   Procedure: DILATATION & CURETTAGE/HYSTEROSCOPY WITH MYOSURE RESECTION OF ENDOMETRIAL POLYP, ENDOCERVICAL CURETTAGE;  Surgeon: Cathlyn JAYSON Nikki Bobie FORBES, MD;  Location: [facility];  Service: Gynecology;  Laterality: N/A;   DILATATION &  CURETTAGE/HYSTEROSCOPY WITH MYOSURE N/A 06/15/2022   Procedure: DILATATION & CURETTAGE/HYSTEROSCOPY WITH MYOSURE;  Surgeon: Cathlyn JAYSON Nikki Bobie FORBES, MD;  Location: [facility];  Service: Gynecology;  Laterality: N/A;   LEEP N/A 06/15/2022   Procedure: LOOP ELECTROSURGICAL EXCISION PROCEDURE (LEEP);  Surgeon: Cathlyn JAYSON Nikki Bobie FORBES, MD;  Location: [facility];  Service: Gynecology;  Laterality: N/A;   LUMBAR DISC SURGERY  10/2018   LUMBAR MICRODISCECTOMY Right 11/09/2018   lumbar level:L5-S1 Utilizing microscope-Dr.Gary Cram   NASAL SEPTOPLASTY W/ TURBINOPLASTY Bilateral 10/07/2015   Procedure: NASAL SEPTOPLASTY WITH BILATERAL TURBINATE REDUCTION;  Surgeon: Daniel Moccasin, MD;  Location: [facility];  Service: ENT;  Laterality: Bilateral;   PILONIDAL CYST EXCISION  1988   ROBOTIC ASSISTED TOTAL HYSTERECTOMY WITH BILATERAL SALPINGO OOPHERECTOMY Bilateral 07/30/2022   Procedure: XI ROBOTIC ASSISTED TOTAL HYSTERECTOMY WITH BILATERAL SALPINGO OOPHORECTOMY, POSSIBLE LAPAROTOMY;  Surgeon: Viktoria Comer SAUNDERS, MD;  Location: [facility];  Service: Gynecology;  Laterality: Bilateral;   SENTINEL NODE BIOPSY N/A 07/30/2022   Procedure: SENTINEL NODE BIOPSY;  Surgeon: Viktoria Comer SAUNDERS, MD;  Location: [facility];  Service: Gynecology;  Laterality: N/A;   WISDOM TOOTH EXTRACTION     yrs ago    Current Outpatient Medications  Medication Sig Dispense Refill   acetaminophen  (TYLENOL ) 500 MG tablet Take 1,000 mg by mouth every 6 (six) hours as needed.     ALPRAZolam  (XANAX ) 0.5 MG tablet TAKE 1/2 TO 1 TABLET BY MOUTH THREE TIMES DAILY AS NEEDED FOR ANXIETY 20 tablet 0   buPROPion  (WELLBUTRIN  XL) 150 MG 24 hr tablet Take 150 mg by mouth daily.     Cholecalciferol (VITAMIN D ) 50 MCG (2000 UT) tablet Take 1 tablet (2,000 Units total) by mouth daily.     citalopram  (CELEXA ) 20 MG tablet Take 20 mg by mouth every evening.     diphenhydrAMINE (BENADRYL) 25 MG tablet Take 50 mg by  mouth every 6 (six) hours as needed for allergies.     EPINEPHrine  0.3 mg/0.3 mL IJ SOAJ injection Inject 0.3 mg into the muscle as needed for anaphylaxis.     estradiol  (VIVELLE -DOT) 0.05 MG/24HR patch APPLY 1 PATCH(0.05 MG) TOPICALLY TO THE SKIN 2 TIMES A WEEK 8 patch 0   famotidine (PEPCID) 20 MG tablet Take 20 mg by mouth daily.     fluticasone  (FLONASE ) 50 MCG/ACT nasal spray Place 2 sprays into both nostrils daily.  0   gabapentin  (NEURONTIN ) 600 MG tablet Take 600 mg by mouth at bedtime.     guaiFENesin  (MUCINEX ) 600 MG 12 hr tablet Take 1,200 mg by mouth 2 (two) times daily.     ibuprofen  (ADVIL ) 200 MG tablet Take 800 mg by mouth every 6 (six) hours as needed.     levocetirizine (XYZAL ) 5 MG tablet TAKE 1 TABLET BY MOUTH EVERY EVENING 90 tablet 3   methocarbamol  (ROBAXIN ) 500 MG tablet Take 1-2 tablets (500-1,000 mg total) by mouth every 8 (eight) hours as needed  for muscle spasms. 20 tablet 0   montelukast  (SINGULAIR ) 10 MG tablet Take 10 mg by mouth every evening.     Olopatadine  HCl 0.2 % SOLN Apply to eye.     predniSONE  (STERAPRED UNI-PAK 21 TAB) 10 MG (21) TBPK tablet Take with food as directed 21 each 0   Pseudoephedrine-DM-GG-APAP (SUDAFED COLD/COUGH PO) Take by mouth.     sodium chloride  (OCEAN) 0.65 % SOLN nasal spray Place 1 spray into both nostrils daily.     zolpidem  (AMBIEN ) 10 MG tablet TAKE 1 TABLET(10 MG) BY MOUTH AT BEDTIME AS NEEDED FOR SLEEP 30 tablet 0   cyanocobalamin  (VITAMIN B12) 500 MCG tablet Take 1 tablet (500 mcg total) by mouth daily. (Patient not taking: Reported on 08/21/2024)     No current facility-administered medications for this visit.    ALLERGIES: Codeine and Pamelor  [nortriptyline ]  Family History  Problem Relation Age of Onset   Hypertension Mother    Diabetes Mother    GER disease Mother    Obesity Mother    Obesity Father    Heart disease Father    Sudden death Father    Brain cancer Sister        glioblastoma   Depression Sister     Hypertension Sister    Anxiety disorder Sister    Cancer Sister    Obesity Sister    Colon cancer Maternal Grandmother         died at 17   Diabetes Maternal Grandmother    Alzheimer's disease Paternal Grandmother    Colon polyps Maternal Uncle    GER disease Maternal Uncle    Breast cancer Cousin    Migraines Neg Hx    Ovarian cancer Neg Hx    Endometrial cancer Neg Hx    Pancreatic cancer Neg Hx    Prostate cancer Neg Hx     Review of Systems  All other systems reviewed and are negative.   PHYSICAL EXAM:  BP 124/82 (BP Location: Left Arm, Patient Position: Sitting)   Pulse 63   Ht 5' 5.5 (1.664 m)   Wt 199 lb (90.3 kg)   LMP 12/18/2015   SpO2 100%   BMI 32.61 kg/m     General appearance: alert, cooperative and appears stated age Head: normocephalic, without obvious abnormality, atraumatic Neck: no adenopathy, supple, symmetrical, trachea midline and thyroid normal to inspection and palpation Lungs: clear to auscultation bilaterally Breasts: normal appearance, no masses or tenderness, No nipple retraction or dimpling, No nipple discharge or bleeding, No axillary adenopathy Heart: regular rate and rhythm Abdomen: soft, non-tender; no masses, no organomegaly Extremities: extremities normal, atraumatic, no cyanosis or edema Skin: skin color, texture, turgor normal. No rashes or lesions Lymph nodes: cervical, supraclavicular, and axillary nodes normal. Neurologic: grossly normal  Pelvic: External genitalia:  no lesions              No abnormal inguinal nodes palpated.              Urethra:  normal appearing urethra with no masses, tenderness or lesions              Bartholins and Skenes: normal                 Vagina: normal appearing vagina with normal color and discharge, no lesions              Cervix: absent              Pap taken: yes  Bimanual Exam:  Uterus: absent              Adnexa: no mass, fullness, tenderness              Rectal exam: yes.  Confirms.               Anus:  normal sphincter tone, no lesions  Chaperone was present for exam:  Kari HERO, CMA  ASSESSMENT: Well woman visit with gynecologic exam. Hx endometrial cancer.  No evidence of recurrence.  Status post robotic-assisted laparoscopic total hysterectomy with bilateral salpingoophorectomy, SLN biopsy bilaterally.  Negative for residual endometrial cancer.  Cervix benign chronic cervicitis. Fibroids noted.  Benign tubes and ovaries.  Hx prior CIN 3.  Status post conization in 2001. Vaginal cancer screening today.  ERT.  PHQ-2-9: 0  PLAN: Mammogram screening discussed. Self breast awareness reviewed. Pap and HRV collected:  yes Guidelines for Calcium, Vitamin D , regular exercise program including cardiovascular and weight bearing exercise. Medication refills:  Vivelle  Dot 0.05 mg twice weekly. #24, RF 3.  Discused WHI and use of HRT which can increase risk of PE, DVT, MI, stroke and breast cancer.  Benefits of treating vasomotor symptoms and reducing risk of osteoporosis also reviewed. Labs with PCP.  Follow up:  yearly and prn.

## 2024-08-23 ENCOUNTER — Ambulatory Visit: Payer: Self-pay | Admitting: Obstetrics and Gynecology

## 2024-08-23 LAB — CYTOLOGY - PAP
Comment: NEGATIVE
Diagnosis: NEGATIVE
High risk HPV: NEGATIVE

## 2024-09-05 ENCOUNTER — Other Ambulatory Visit: Payer: Self-pay | Admitting: Obstetrics and Gynecology

## 2024-09-05 MED ORDER — FLUCONAZOLE 150 MG PO TABS: 150.0000 mg | ORAL_TABLET | Freq: Once | ORAL | 0 refills | Status: AC

## 2024-09-10 NOTE — Progress Notes (Unsigned)
 No chief complaint on file.    She was prescribed diflucan  on 12/23 by Dr. Nikki for symptoms of vaginal itching and irritation.  She had a WWE earlier in the month, with normal pap smear (and no complaints at that time).   PMH, PSH, SH reviewed   ROS:    PHYSICAL EXAM:  LMP 12/18/2015   Wt Readings from Last 3 Encounters:  08/21/24 199 lb (90.3 kg)  03/22/24 197 lb 6.4 oz (89.5 kg)  11/29/23 189 lb (85.7 kg)       ASSESSMENT/PLAN:

## 2024-09-11 ENCOUNTER — Encounter: Payer: Self-pay | Admitting: Family Medicine

## 2024-09-11 ENCOUNTER — Ambulatory Visit: Admitting: Family Medicine

## 2024-09-11 VITALS — BP 134/76 | HR 69 | Temp 98.9°F | Ht 64.0 in | Wt 199.0 lb

## 2024-09-11 DIAGNOSIS — R0609 Other forms of dyspnea: Secondary | ICD-10-CM | POA: Diagnosis not present

## 2024-09-11 DIAGNOSIS — M7701 Medial epicondylitis, right elbow: Secondary | ICD-10-CM | POA: Diagnosis not present

## 2024-09-11 DIAGNOSIS — R5383 Other fatigue: Secondary | ICD-10-CM | POA: Diagnosis not present

## 2024-09-11 DIAGNOSIS — R519 Headache, unspecified: Secondary | ICD-10-CM

## 2024-09-11 DIAGNOSIS — H9222 Otorrhagia, left ear: Secondary | ICD-10-CM | POA: Diagnosis not present

## 2024-09-11 MED ORDER — MELOXICAM 15 MG PO TABS: ORAL_TABLET | ORAL | 0 refills | Status: AC

## 2024-09-11 NOTE — Patient Instructions (Addendum)
 Exercises for the elbow--hold all stretches for 10-15 seconds, do 10 repetitions, twice daily.  Take meloxicam  once daily with food. Do NOT take any ibuprofen  (motrin /advil ) or aleve (naproxen) or Goody or BC powder while taking the meloxicam . You MAY take tylenol . If you cannot tolerate the meloxicam , you can try topical voltaren gel, or switch back to ibuprofen , but take 600-800 mg three times daily with food. Take the anti-inflammatory daily until your pain has completely resolved. You may take the full 15 days, if needed. If you are not better, please schedule an appointment with Dr. Vita in our office.  Your blood pressure was up some today. Sometimes this can be related to pain. Please try and periodically check your blood pressure to ensure that this is coming down.  You mentioned the shortness of breath at the end. We are checking some labs to rule out certain issues. If your breathing is getting worse, please return for further evaluation.   I encourage you to get a routine eye exam. Eye strain can cause headaches that start later in the day.

## 2024-09-12 ENCOUNTER — Ambulatory Visit: Payer: Self-pay | Admitting: Family Medicine

## 2024-09-12 LAB — COMPREHENSIVE METABOLIC PANEL WITH GFR
ALT: 27 IU/L (ref 0–32)
AST: 21 IU/L (ref 0–40)
Albumin: 4.4 g/dL (ref 3.8–4.9)
Alkaline Phosphatase: 78 IU/L (ref 49–135)
BUN/Creatinine Ratio: 15 (ref 9–23)
BUN: 13 mg/dL (ref 6–24)
Bilirubin Total: 0.6 mg/dL (ref 0.0–1.2)
CO2: 22 mmol/L (ref 20–29)
Calcium: 9.9 mg/dL (ref 8.7–10.2)
Chloride: 102 mmol/L (ref 96–106)
Creatinine, Ser: 0.87 mg/dL (ref 0.57–1.00)
Globulin, Total: 2.3 g/dL (ref 1.5–4.5)
Glucose: 84 mg/dL (ref 70–99)
Potassium: 4.5 mmol/L (ref 3.5–5.2)
Sodium: 139 mmol/L (ref 134–144)
Total Protein: 6.7 g/dL (ref 6.0–8.5)
eGFR: 77 mL/min/1.73

## 2024-09-12 LAB — VITAMIN B12: Vitamin B-12: 566 pg/mL (ref 232–1245)

## 2024-09-12 LAB — CBC WITH DIFFERENTIAL/PLATELET
Basophils Absolute: 0 x10E3/uL (ref 0.0–0.2)
Basos: 1 %
EOS (ABSOLUTE): 0.4 x10E3/uL (ref 0.0–0.4)
Eos: 5 %
Hematocrit: 40 % (ref 34.0–46.6)
Hemoglobin: 13.2 g/dL (ref 11.1–15.9)
Immature Grans (Abs): 0 x10E3/uL (ref 0.0–0.1)
Immature Granulocytes: 0 %
Lymphocytes Absolute: 1.8 x10E3/uL (ref 0.7–3.1)
Lymphs: 23 %
MCH: 30.6 pg (ref 26.6–33.0)
MCHC: 33 g/dL (ref 31.5–35.7)
MCV: 93 fL (ref 79–97)
Monocytes Absolute: 0.6 x10E3/uL (ref 0.1–0.9)
Monocytes: 8 %
Neutrophils Absolute: 5 x10E3/uL (ref 1.4–7.0)
Neutrophils: 63 %
Platelets: 243 x10E3/uL (ref 150–450)
RBC: 4.32 x10E6/uL (ref 3.77–5.28)
RDW: 13.1 % (ref 11.7–15.4)
WBC: 7.9 x10E3/uL (ref 3.4–10.8)

## 2024-09-12 LAB — TSH: TSH: 1.45 u[IU]/mL (ref 0.450–4.500)

## 2024-09-13 ENCOUNTER — Ambulatory Visit: Admitting: Family Medicine

## 2024-09-20 ENCOUNTER — Other Ambulatory Visit: Payer: Self-pay | Admitting: Family Medicine

## 2024-09-20 DIAGNOSIS — G4700 Insomnia, unspecified: Secondary | ICD-10-CM

## 2024-10-23 ENCOUNTER — Encounter: Payer: 59 | Admitting: Family Medicine

## 2025-08-27 ENCOUNTER — Ambulatory Visit: Admitting: Obstetrics and Gynecology
